# Patient Record
Sex: Male | Born: 1985
Health system: Southern US, Community
[De-identification: ages and names within clinical notes are randomized; demographics above are authoritative.]

---
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
## 2010-04-23 ENCOUNTER — Emergency Department (HOSPITAL_COMMUNITY)
Admission: EM | Admit: 2010-04-23 | Discharge: 2010-04-24 | Payer: Self-pay | Source: Home / Self Care | Admitting: Emergency Medicine

## 2016-03-14 ENCOUNTER — Encounter (HOSPITAL_COMMUNITY): Payer: Self-pay | Admitting: Emergency Medicine

## 2016-03-14 ENCOUNTER — Ambulatory Visit (HOSPITAL_COMMUNITY)
Admission: EM | Admit: 2016-03-14 | Discharge: 2016-03-14 | Disposition: A | Discharge status: Home / Self Care | Payer: BLUE CROSS/BLUE SHIELD | Attending: Internal Medicine | Admitting: Internal Medicine

## 2016-03-14 DIAGNOSIS — M7041 Prepatellar bursitis, right knee: Secondary | ICD-10-CM

## 2016-03-14 MED ORDER — SULFAMETHOXAZOLE-TRIMETHOPRIM 800-160 MG PO TABS: 1.0000 | ORAL_TABLET | Freq: Two times a day (BID) | ORAL | 0 refills | Status: AC

## 2016-03-14 MED ORDER — IBUPROFEN 800 MG PO TABS: 800.0000 mg | ORAL_TABLET | Freq: Three times a day (TID) | ORAL | 0 refills | Status: DC

## 2016-03-14 NOTE — Discharge Instructions (Signed)
Return here for recheck or see your Physician tomorrow for recheck

## 2016-03-14 NOTE — ED Triage Notes (Signed)
Pt reports chronic issues with staff infection.  He normally will work the abscess out himself, but this one is on his knee and it is very hard and painful and will not drain.  He denies any fever.

## 2016-03-15 NOTE — ED Provider Notes (Signed)
[facility]-EMERGENCY DEPT Provider Note   CSN: [id number] Arrival date & time: 03/14/16  1755     History   Chief Complaint Chief Complaint  Patient presents with  . Abscess    right knee    HPI David Craig is a 30 y.o. male.  The history is provided by the patient. No language interpreter was used.  Abscess  Location:  Leg Leg abscess location:  R knee Size:  4 Abscess quality: painful and redness   Red streaking: no   Duration:  3 days Progression:  Worsening Pain details:    Quality:  Hot   Severity:  Moderate   Duration:  3 days   Timing:  Constant   Progression:  Worsening Chronicity:  New Relieved by:  Nothing Worsened by:  Nothing Ineffective treatments:  None tried Associated symptoms: no vomiting    Pt reports he has frequent stph infections.  Pt reports he got a pustule on his knee that he popped, now his knee is swollen History reviewed. No pertinent past medical history.  There are no active problems to display for this patient.   History reviewed. No pertinent surgical history.     Home Medications    Prior to Admission medications   Medication Sig Start Date End Date Taking? Authorizing Provider  ibuprofen (ADVIL,MOTRIN) 800 MG tablet Take 1 tablet (800 mg total) by mouth 3 (three) times daily. 03/14/16   Elson AreasLeslie K Taylar Hartsough, PA-C  sulfamethoxazole-trimethoprim (BACTRIM DS,SEPTRA DS) 800-160 MG tablet Take 1 tablet by mouth 2 (two) times daily. 03/14/16 03/21/16  Elson AreasLeslie K Lorris Carducci, PA-C    Family History History reviewed. No pertinent family history.  Social History Social History  Substance Use Topics  . Smoking status: Former Smoker    Quit date: 03/15/2015  . Smokeless tobacco: Never Used  . Alcohol use Yes     Comment: occasional     Allergies   Review of patient's allergies indicates no known allergies.   Review of Systems Review of Systems  Gastrointestinal: Negative for vomiting.  All other systems reviewed and are  negative.    Physical Exam Updated Vital Signs BP 150/86 (BP Location: Left Arm)   Pulse 83   Temp 98.2 F (36.8 C) (Oral)   SpO2 99%   Physical Exam  Constitutional: He is oriented to person, place, and time. He appears well-developed and well-nourished.  HENT:  Head: Normocephalic and atraumatic.  Eyes: Conjunctivae and EOM are normal.  Neck: Normal range of motion. Neck supple.  Cardiovascular: Normal rate.   No murmur heard. Pulmonary/Chest: Effort normal. No respiratory distress.  Abdominal: Soft. He exhibits no distension. There is no tenderness.  Musculoskeletal: He exhibits no edema.  4cm area of redness over bursa,  Dried scab center,  Knee joint no effusion,  No swelling to joint.  nv and ns intact  Neurological: He is alert and oriented to person, place, and time.  Skin: Skin is warm and dry.  Psychiatric: He has a normal mood and affect.  Nursing note and vitals reviewed.    ED Treatments / Results  Labs (all labs ordered are listed, but only abnormal results are displayed) Labs Reviewed - No data to display  EKG  EKG Interpretation None       Radiology No results found.  Procedures Procedures (including critical care time)  Medications Ordered in ED Medications - No data to display   Initial Impression / Assessment and Plan / ED Course  I have reviewed the triage vital  signs and the nursing notes.  Pertinent labs & imaging results that were available during my care of the patient were reviewed by me and considered in my medical decision making (see chart for details).  Clinical Course    Infection seems to be skin,  Possible bursistis as well as infected area.  Pt works on his knees.  No signs of septic joint.  Pt is to recheck with his MD tomorrow.    Final Clinical Impressions(s) / ED Diagnoses   Final diagnoses:  Bursitis, prepatellar, right    New Prescriptions Discharge Medication List as of 03/14/2016  7:04 PM    START taking  these medications   Details  ibuprofen (ADVIL,MOTRIN) 800 MG tablet Take 1 tablet (800 mg total) by mouth 3 (three) times daily., Starting Thu 03/14/2016, Normal    sulfamethoxazole-trimethoprim (BACTRIM DS,SEPTRA DS) 800-160 MG tablet Take 1 tablet by mouth 2 (two) times daily., Starting Thu 03/14/2016, Until Thu 03/21/2016, Normal      An After Visit Summary was printed and given to the patient.   Lonia Skinner [place], PA-C 03/15/16 1011

## 2018-10-26 DIAGNOSIS — R0602 Shortness of breath: Secondary | ICD-10-CM | POA: Diagnosis not present

## 2018-10-29 DIAGNOSIS — R0602 Shortness of breath: Secondary | ICD-10-CM | POA: Diagnosis not present

## 2019-02-25 ENCOUNTER — Other Ambulatory Visit: Payer: Self-pay

## 2019-02-25 DIAGNOSIS — Z20822 Contact with and (suspected) exposure to covid-19: Secondary | ICD-10-CM

## 2019-02-26 LAB — NOVEL CORONAVIRUS, NAA: SARS-CoV-2, NAA: NOT DETECTED

## 2019-03-22 ENCOUNTER — Other Ambulatory Visit: Payer: Self-pay

## 2019-03-22 DIAGNOSIS — Z20822 Contact with and (suspected) exposure to covid-19: Secondary | ICD-10-CM

## 2019-03-23 LAB — NOVEL CORONAVIRUS, NAA: SARS-CoV-2, NAA: NOT DETECTED

## 2021-02-14 ENCOUNTER — Encounter (HOSPITAL_COMMUNITY)
Admission: EM | Disposition: A | Discharge status: Rehabilitation Facility | Payer: Self-pay | Source: Home / Self Care | Attending: Neurology

## 2021-02-14 ENCOUNTER — Emergency Department (HOSPITAL_COMMUNITY): Payer: BC Managed Care – PPO

## 2021-02-14 ENCOUNTER — Inpatient Hospital Stay (HOSPITAL_COMMUNITY): Payer: BC Managed Care – PPO | Admitting: Anesthesiology

## 2021-02-14 ENCOUNTER — Inpatient Hospital Stay (HOSPITAL_COMMUNITY)
Admission: EM | Admit: 2021-02-14 | Discharge: 2021-02-22 | DRG: 023 | Disposition: A | Discharge status: Rehabilitation Facility | Payer: BC Managed Care – PPO | Attending: Neurology | Admitting: Neurology

## 2021-02-14 ENCOUNTER — Inpatient Hospital Stay (HOSPITAL_COMMUNITY): Payer: BC Managed Care – PPO

## 2021-02-14 DIAGNOSIS — E46 Unspecified protein-calorie malnutrition: Secondary | ICD-10-CM | POA: Diagnosis not present

## 2021-02-14 DIAGNOSIS — W182XXA Fall in (into) shower or empty bathtub, initial encounter: Secondary | ICD-10-CM | POA: Diagnosis present

## 2021-02-14 DIAGNOSIS — I63541 Cerebral infarction due to unspecified occlusion or stenosis of right cerebellar artery: Secondary | ICD-10-CM | POA: Diagnosis not present

## 2021-02-14 DIAGNOSIS — Q211 Atrial septal defect: Secondary | ICD-10-CM

## 2021-02-14 DIAGNOSIS — D6851 Activated protein C resistance: Secondary | ICD-10-CM | POA: Diagnosis present

## 2021-02-14 DIAGNOSIS — Z20822 Contact with and (suspected) exposure to covid-19: Secondary | ICD-10-CM | POA: Diagnosis not present

## 2021-02-14 DIAGNOSIS — R29712 NIHSS score 12: Secondary | ICD-10-CM | POA: Diagnosis present

## 2021-02-14 DIAGNOSIS — F1290 Cannabis use, unspecified, uncomplicated: Secondary | ICD-10-CM | POA: Diagnosis present

## 2021-02-14 DIAGNOSIS — F1210 Cannabis abuse, uncomplicated: Secondary | ICD-10-CM | POA: Diagnosis not present

## 2021-02-14 DIAGNOSIS — R5383 Other fatigue: Secondary | ICD-10-CM | POA: Diagnosis not present

## 2021-02-14 DIAGNOSIS — J9600 Acute respiratory failure, unspecified whether with hypoxia or hypercapnia: Secondary | ICD-10-CM | POA: Diagnosis not present

## 2021-02-14 DIAGNOSIS — R471 Dysarthria and anarthria: Secondary | ICD-10-CM | POA: Diagnosis present

## 2021-02-14 DIAGNOSIS — I63531 Cerebral infarction due to unspecified occlusion or stenosis of right posterior cerebral artery: Secondary | ICD-10-CM | POA: Diagnosis not present

## 2021-02-14 DIAGNOSIS — G8194 Hemiplegia, unspecified affecting left nondominant side: Secondary | ICD-10-CM | POA: Diagnosis present

## 2021-02-14 DIAGNOSIS — Y93E1 Activity, personal bathing and showering: Secondary | ICD-10-CM

## 2021-02-14 DIAGNOSIS — I69391 Dysphagia following cerebral infarction: Secondary | ICD-10-CM | POA: Diagnosis not present

## 2021-02-14 DIAGNOSIS — E8809 Other disorders of plasma-protein metabolism, not elsewhere classified: Secondary | ICD-10-CM | POA: Diagnosis not present

## 2021-02-14 DIAGNOSIS — Z539 Procedure and treatment not carried out, unspecified reason: Secondary | ICD-10-CM | POA: Diagnosis not present

## 2021-02-14 DIAGNOSIS — Y92009 Unspecified place in unspecified non-institutional (private) residence as the place of occurrence of the external cause: Secondary | ICD-10-CM

## 2021-02-14 DIAGNOSIS — I6329 Cerebral infarction due to unspecified occlusion or stenosis of other precerebral arteries: Secondary | ICD-10-CM | POA: Diagnosis not present

## 2021-02-14 DIAGNOSIS — E785 Hyperlipidemia, unspecified: Secondary | ICD-10-CM | POA: Diagnosis present

## 2021-02-14 DIAGNOSIS — G479 Sleep disorder, unspecified: Secondary | ICD-10-CM | POA: Diagnosis not present

## 2021-02-14 DIAGNOSIS — I63511 Cerebral infarction due to unspecified occlusion or stenosis of right middle cerebral artery: Secondary | ICD-10-CM | POA: Diagnosis not present

## 2021-02-14 DIAGNOSIS — Z28310 Unvaccinated for covid-19: Secondary | ICD-10-CM | POA: Diagnosis not present

## 2021-02-14 DIAGNOSIS — Z6834 Body mass index (BMI) 34.0-34.9, adult: Secondary | ICD-10-CM

## 2021-02-14 DIAGNOSIS — I69354 Hemiplegia and hemiparesis following cerebral infarction affecting left non-dominant side: Secondary | ICD-10-CM | POA: Diagnosis not present

## 2021-02-14 DIAGNOSIS — I6521 Occlusion and stenosis of right carotid artery: Secondary | ICD-10-CM | POA: Diagnosis present

## 2021-02-14 DIAGNOSIS — R29810 Facial weakness: Secondary | ICD-10-CM | POA: Diagnosis present

## 2021-02-14 DIAGNOSIS — E876 Hypokalemia: Secondary | ICD-10-CM | POA: Diagnosis not present

## 2021-02-14 DIAGNOSIS — S93402A Sprain of unspecified ligament of left ankle, initial encounter: Secondary | ICD-10-CM | POA: Diagnosis not present

## 2021-02-14 DIAGNOSIS — F1410 Cocaine abuse, uncomplicated: Secondary | ICD-10-CM | POA: Diagnosis not present

## 2021-02-14 DIAGNOSIS — Z7982 Long term (current) use of aspirin: Secondary | ICD-10-CM | POA: Diagnosis not present

## 2021-02-14 DIAGNOSIS — Z87891 Personal history of nicotine dependence: Secondary | ICD-10-CM | POA: Diagnosis not present

## 2021-02-14 DIAGNOSIS — I63232 Cerebral infarction due to unspecified occlusion or stenosis of left carotid arteries: Secondary | ICD-10-CM | POA: Diagnosis not present

## 2021-02-14 DIAGNOSIS — R9431 Abnormal electrocardiogram [ECG] [EKG]: Secondary | ICD-10-CM | POA: Diagnosis not present

## 2021-02-14 DIAGNOSIS — S93402D Sprain of unspecified ligament of left ankle, subsequent encounter: Secondary | ICD-10-CM | POA: Diagnosis not present

## 2021-02-14 DIAGNOSIS — I63231 Cerebral infarction due to unspecified occlusion or stenosis of right carotid arteries: Secondary | ICD-10-CM | POA: Diagnosis not present

## 2021-02-14 DIAGNOSIS — R0902 Hypoxemia: Secondary | ICD-10-CM | POA: Diagnosis not present

## 2021-02-14 DIAGNOSIS — Z7902 Long term (current) use of antithrombotics/antiplatelets: Secondary | ICD-10-CM | POA: Diagnosis not present

## 2021-02-14 DIAGNOSIS — I639 Cerebral infarction, unspecified: Secondary | ICD-10-CM | POA: Diagnosis not present

## 2021-02-14 DIAGNOSIS — I7771 Dissection of carotid artery: Secondary | ICD-10-CM | POA: Diagnosis not present

## 2021-02-14 DIAGNOSIS — E669 Obesity, unspecified: Secondary | ICD-10-CM | POA: Diagnosis present

## 2021-02-14 DIAGNOSIS — I69322 Dysarthria following cerebral infarction: Secondary | ICD-10-CM | POA: Diagnosis not present

## 2021-02-14 DIAGNOSIS — W19XXXA Unspecified fall, initial encounter: Secondary | ICD-10-CM

## 2021-02-14 DIAGNOSIS — I10 Essential (primary) hypertension: Secondary | ICD-10-CM | POA: Diagnosis not present

## 2021-02-14 DIAGNOSIS — R29818 Other symptoms and signs involving the nervous system: Secondary | ICD-10-CM | POA: Diagnosis not present

## 2021-02-14 DIAGNOSIS — F1910 Other psychoactive substance abuse, uncomplicated: Secondary | ICD-10-CM | POA: Diagnosis not present

## 2021-02-14 DIAGNOSIS — J95821 Acute postprocedural respiratory failure: Secondary | ICD-10-CM | POA: Diagnosis not present

## 2021-02-14 DIAGNOSIS — T148XXA Other injury of unspecified body region, initial encounter: Secondary | ICD-10-CM | POA: Diagnosis not present

## 2021-02-14 DIAGNOSIS — D6852 Prothrombin gene mutation: Secondary | ICD-10-CM | POA: Diagnosis present

## 2021-02-14 DIAGNOSIS — F1490 Cocaine use, unspecified, uncomplicated: Secondary | ICD-10-CM | POA: Diagnosis not present

## 2021-02-14 DIAGNOSIS — R531 Weakness: Secondary | ICD-10-CM | POA: Diagnosis present

## 2021-02-14 DIAGNOSIS — R739 Hyperglycemia, unspecified: Secondary | ICD-10-CM | POA: Diagnosis not present

## 2021-02-14 DIAGNOSIS — H53462 Homonymous bilateral field defects, left side: Secondary | ICD-10-CM | POA: Diagnosis not present

## 2021-02-14 DIAGNOSIS — G43909 Migraine, unspecified, not intractable, without status migrainosus: Secondary | ICD-10-CM | POA: Diagnosis present

## 2021-02-14 DIAGNOSIS — R7401 Elevation of levels of liver transaminase levels: Secondary | ICD-10-CM | POA: Diagnosis present

## 2021-02-14 DIAGNOSIS — R1310 Dysphagia, unspecified: Secondary | ICD-10-CM | POA: Diagnosis present

## 2021-02-14 DIAGNOSIS — D6862 Lupus anticoagulant syndrome: Secondary | ICD-10-CM | POA: Diagnosis not present

## 2021-02-14 DIAGNOSIS — I081 Rheumatic disorders of both mitral and tricuspid valves: Secondary | ICD-10-CM | POA: Diagnosis not present

## 2021-02-14 DIAGNOSIS — I6389 Other cerebral infarction: Secondary | ICD-10-CM | POA: Diagnosis not present

## 2021-02-14 DIAGNOSIS — Z978 Presence of other specified devices: Secondary | ICD-10-CM

## 2021-02-14 DIAGNOSIS — F17200 Nicotine dependence, unspecified, uncomplicated: Secondary | ICD-10-CM | POA: Diagnosis not present

## 2021-02-14 HISTORY — PX: RADIOLOGY WITH ANESTHESIA: SHX6223

## 2021-02-14 LAB — COMPREHENSIVE METABOLIC PANEL
ALT: 58 U/L — ABNORMAL HIGH (ref 0–44)
AST: 31 U/L (ref 15–41)
Albumin: 4.4 g/dL (ref 3.5–5.0)
Alkaline Phosphatase: 64 U/L (ref 38–126)
Anion gap: 10 (ref 5–15)
BUN: 13 mg/dL (ref 6–20)
CO2: 28 mmol/L (ref 22–32)
Calcium: 9.7 mg/dL (ref 8.9–10.3)
Chloride: 101 mmol/L (ref 98–111)
Creatinine, Ser: 0.7 mg/dL (ref 0.61–1.24)
GFR, Estimated: >60 mL/min (ref >60)
Glucose, Bld: 131 mg/dL — ABNORMAL HIGH (ref 70–99)
Potassium: 4 mmol/L (ref 3.5–5.1)
Sodium: 139 mmol/L (ref 135–145)
Total Bilirubin: 1.3 mg/dL — ABNORMAL HIGH (ref 0.3–1.2)
Total Protein: 7.6 g/dL (ref 6.5–8.1)

## 2021-02-14 LAB — CBC
HCT: 50.4 % (ref 39.0–52.0)
Hemoglobin: 17.9 g/dL — ABNORMAL HIGH (ref 13.0–17.0)
MCH: 31.1 pg (ref 26.0–34.0)
MCHC: 35.5 g/dL (ref 30.0–36.0)
MCV: 87.5 fL (ref 80.0–100.0)
Platelets: 176 10*3/uL (ref 150–400)
RBC: 5.76 MIL/uL (ref 4.22–5.81)
RDW: 12.6 % (ref 11.5–15.5)
WBC: 13.9 10*3/uL — ABNORMAL HIGH (ref 4.0–10.5)
nRBC: 0 % (ref 0.0–0.2)

## 2021-02-14 LAB — DIFFERENTIAL
Abs Immature Granulocytes: 0.05 10*3/uL (ref 0.00–0.07)
Basophils Absolute: 0 10*3/uL (ref 0.0–0.1)
Basophils Relative: 0 %
Eosinophils Absolute: 0 10*3/uL (ref 0.0–0.5)
Eosinophils Relative: 0 %
Immature Granulocytes: 0 %
Lymphocytes Relative: 8 %
Lymphs Abs: 1.2 10*3/uL (ref 0.7–4.0)
Monocytes Absolute: 0.7 10*3/uL (ref 0.1–1.0)
Monocytes Relative: 5 %
Neutro Abs: 11.9 10*3/uL — ABNORMAL HIGH (ref 1.7–7.7)
Neutrophils Relative %: 87 %

## 2021-02-14 LAB — I-STAT CHEM 8, ED
BUN: 16 mg/dL (ref 6–20)
Calcium, Ion: 1.11 mmol/L — ABNORMAL LOW (ref 1.15–1.40)
Chloride: 103 mmol/L (ref 98–111)
Creatinine, Ser: 0.7 mg/dL (ref 0.61–1.24)
Glucose, Bld: 127 mg/dL — ABNORMAL HIGH (ref 70–99)
HCT: 51 % (ref 39.0–52.0)
Hemoglobin: 17.3 g/dL — ABNORMAL HIGH (ref 13.0–17.0)
Potassium: 4.2 mmol/L (ref 3.5–5.1)
Sodium: 140 mmol/L (ref 135–145)
TCO2: 26 mmol/L (ref 22–32)

## 2021-02-14 LAB — ETHANOL: Alcohol, Ethyl (B): <10 mg/dL (ref <10)

## 2021-02-14 LAB — RESP PANEL BY RT-PCR (FLU A&B, COVID) ARPGX2
Influenza A by PCR: NEGATIVE
Influenza B by PCR: NEGATIVE
SARS Coronavirus 2 by RT PCR: NEGATIVE

## 2021-02-14 LAB — APTT: aPTT: 27 seconds (ref 24–36)

## 2021-02-14 LAB — PROTIME-INR
INR: 1 (ref 0.8–1.2)
Prothrombin Time: 13.6 seconds (ref 11.4–15.2)

## 2021-02-14 IMAGING — CT CT CERVICAL SPINE W/O CM
3 of 4 series · 13 of 33 positions shown, 16 images · IV contrast (APPLIED)
Comparison: None.

CLINICAL DATA: Fall

EXAM:
CT CERVICAL SPINE WITHOUT CONTRAST
TECHNIQUE: Multidetector CT imaging of the cervical spine was performed without
intravenous contrast. Multiplanar CT image reconstructions were also
generated.

[Series 15: cspine bone · axial · 0.36mm/px · z∈[-332,-182]mm · 5 of 113 slices shown, 7 images]
[im 19/113  soft-tissue]
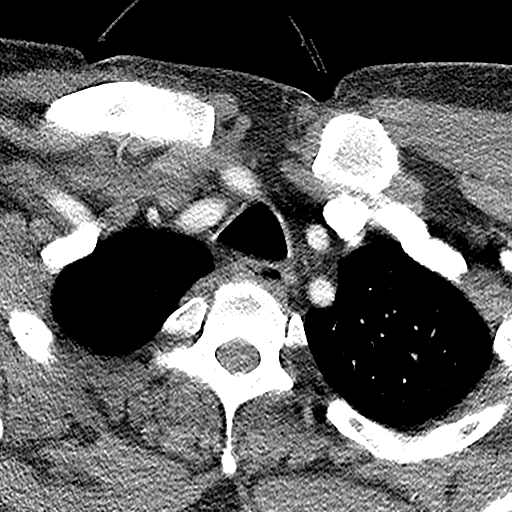
[im 19/113  bone]
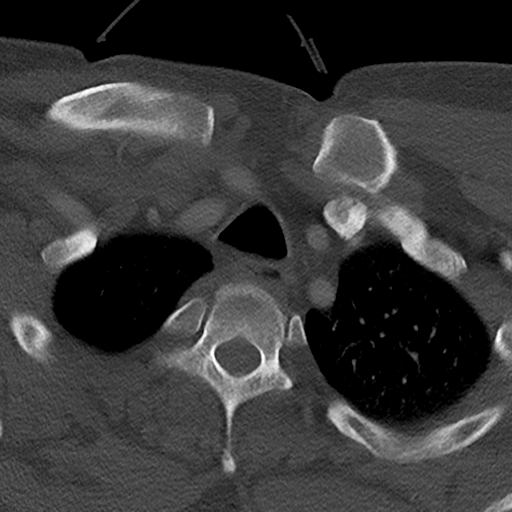
[im 38/113  bone]
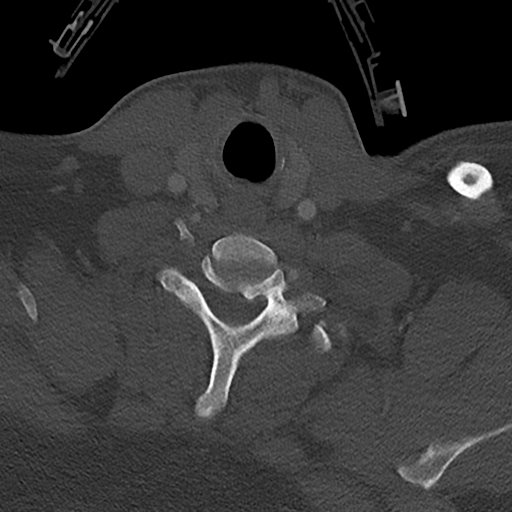
[im 57/113  bone]
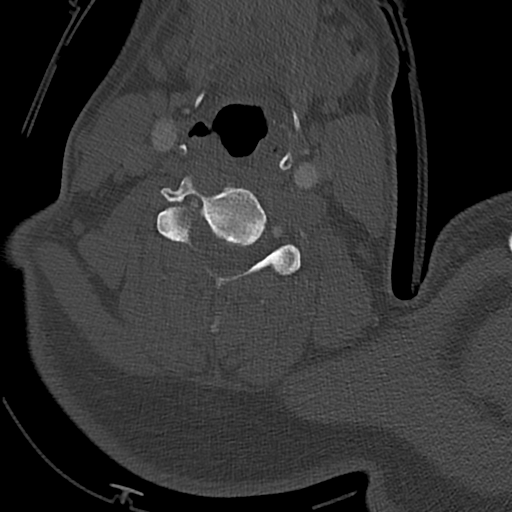
[im 75/113  bone]
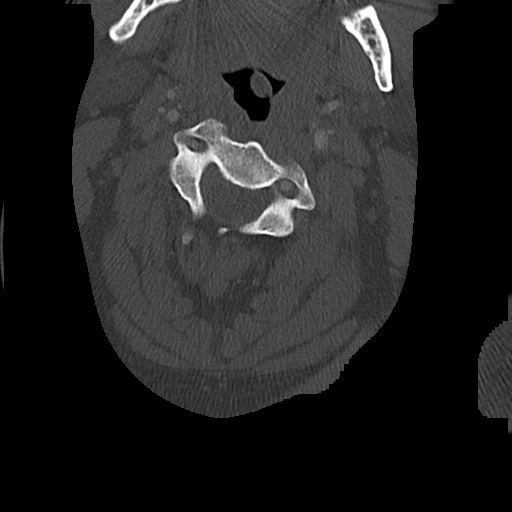
[im 94/113  soft-tissue]
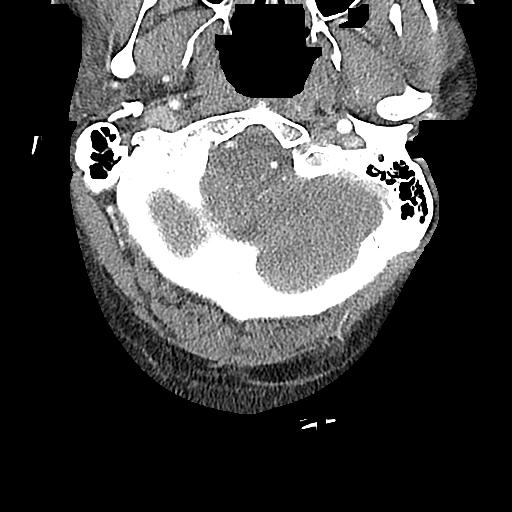
[im 94/113  bone]
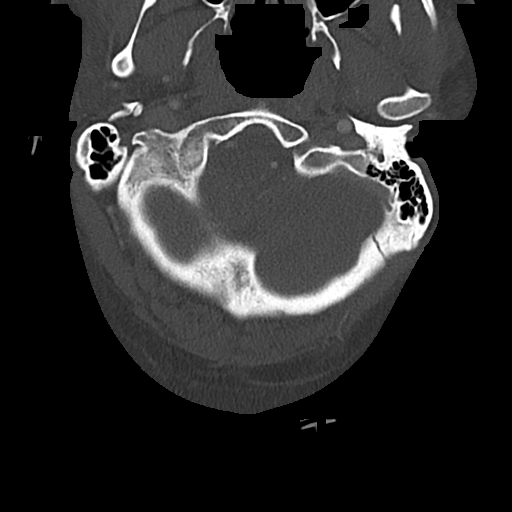

[Series 17: cspine bone cor · coronal · 0.36mm/px · 3 of 74 slices shown]
[im 15/74  bone]
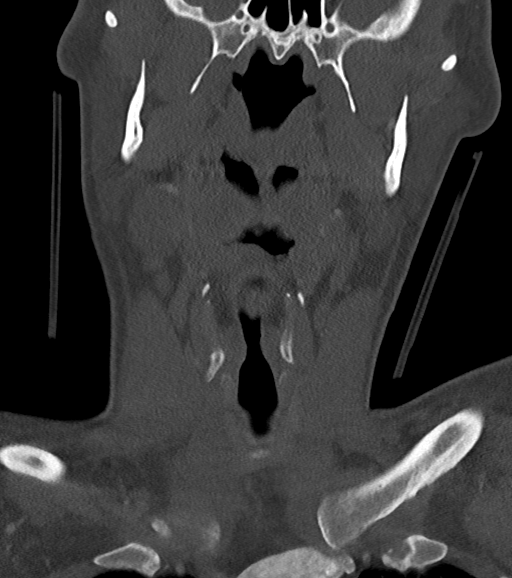
[im 30/74  bone]
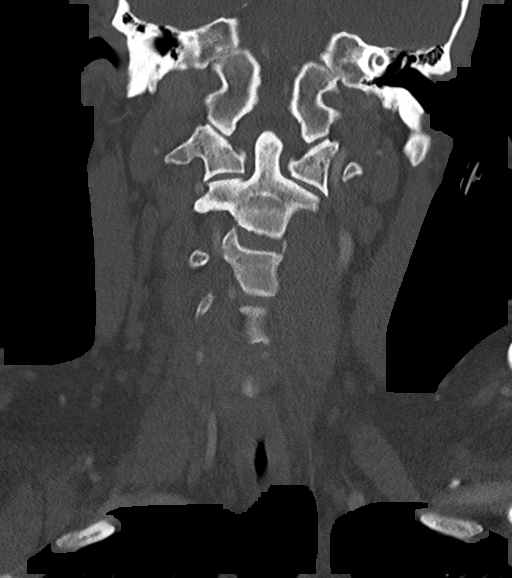
[im 44/74  bone]
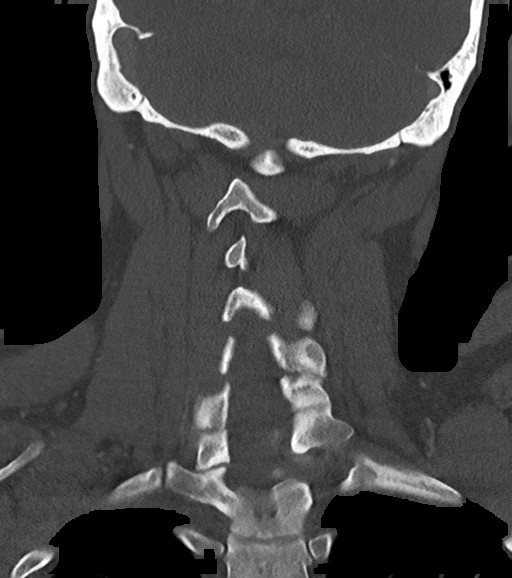

[Series 18: cspine bone sag · sagittal · 0.29mm/px · 5 of 94 slices shown, 6 images]
[im 32/94  bone]
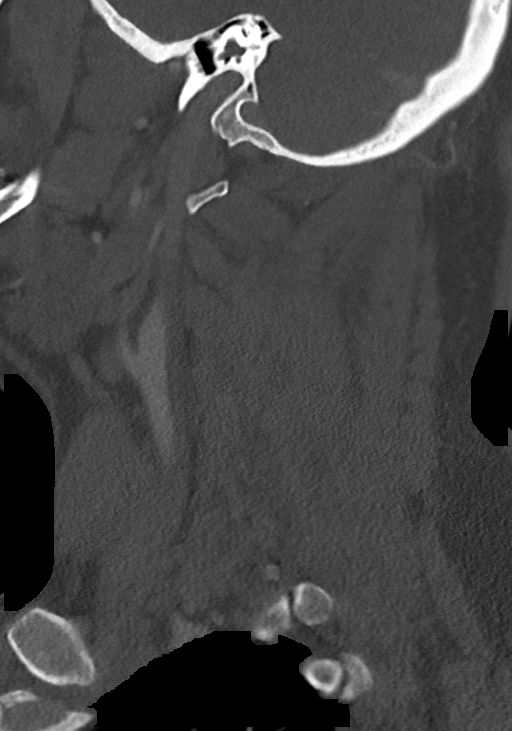
[im 39/94  bone]
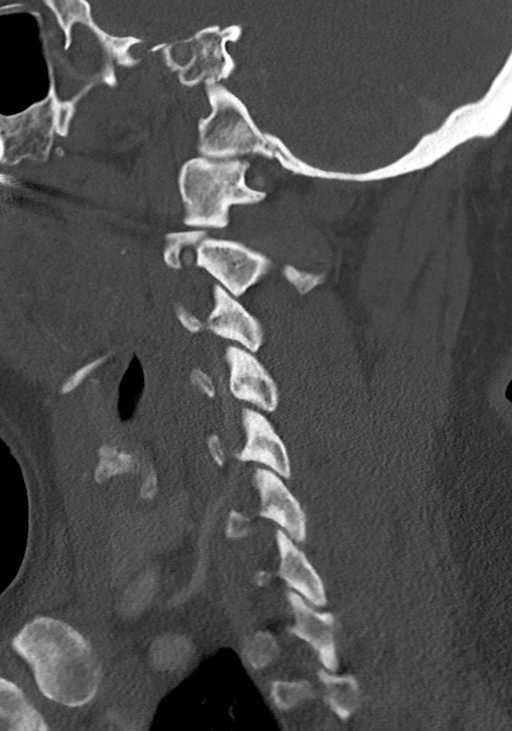
[im 47/94  soft-tissue]
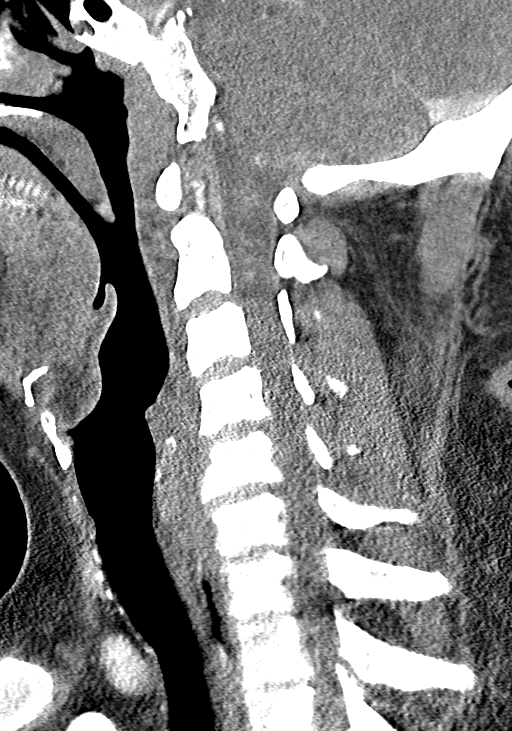
[im 47/94  bone]
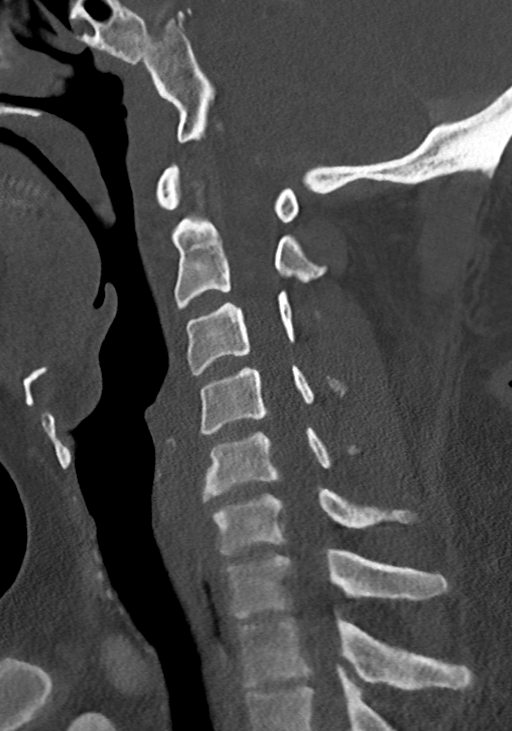
[im 55/94  bone]
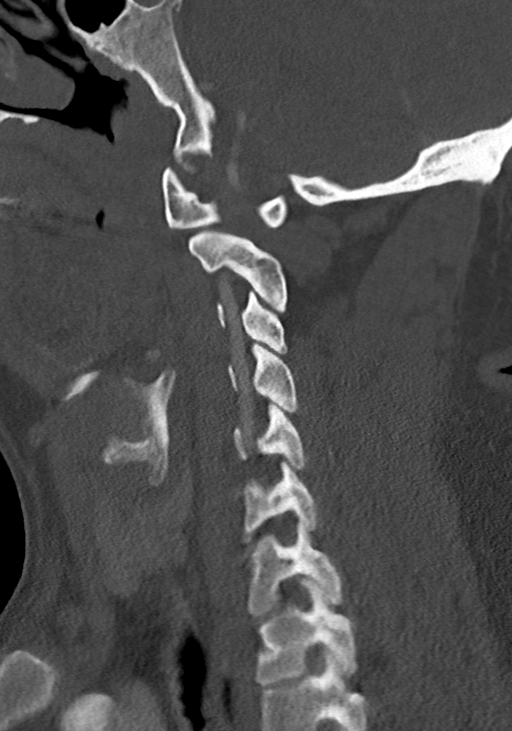
[im 63/94  bone]
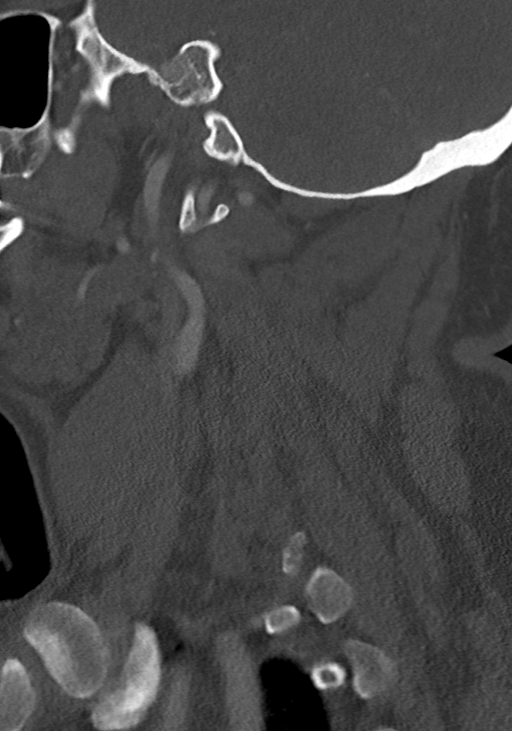

[13 of 33 positions shown; findings below may reference images not displayed]

FINDINGS: Alignment: No static subluxation. Facets are aligned. Occipital
condyles and the lateral masses of C1 and C2 are normally
approximated.

Skull base and vertebrae: No acute fracture.

Soft tissues and spinal canal: No prevertebral fluid or swelling. No
visible canal hematoma.

Disc levels: No advanced spinal canal or neural foraminal stenosis.

Upper chest: No pneumothorax, pulmonary nodule or pleural effusion.

Other: Normal visualized paraspinal cervical soft tissues.
IMPRESSION: No acute fracture or static subluxation of the cervical spine.

## 2021-02-14 IMAGING — CT CT HEAD CODE STROKE
4 series · 16 of 47 positions shown, 18 images · non-contrast
Comparison: None.

CLINICAL DATA: Code stroke.  Left arm weakness and facial droop

EXAM:
CT HEAD WITHOUT CONTRAST
TECHNIQUE: Contiguous axial images were obtained from the base of the skull
through the vertex without intravenous contrast.

[Series 3: head wo · axial · 0.46mm/px · z∈[-166,-46]mm · 7 of 34 slices shown, 9 images]
[im 5/34  brain]
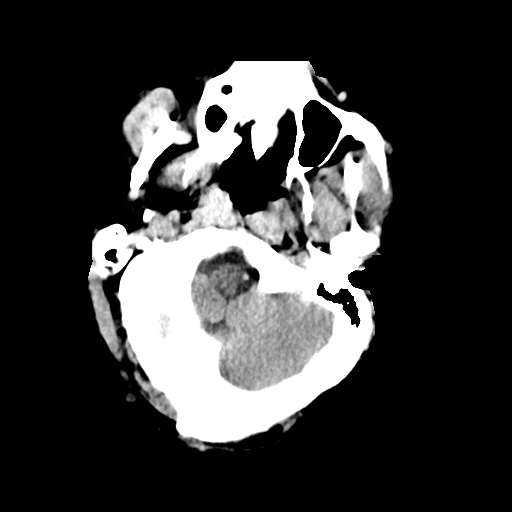
[im 5/34  bone]
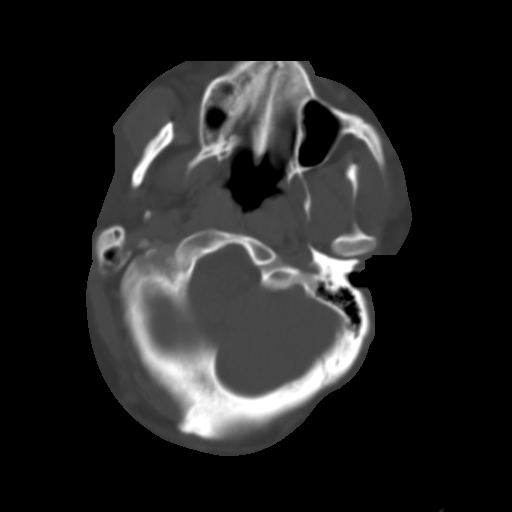
[im 9/34  brain]
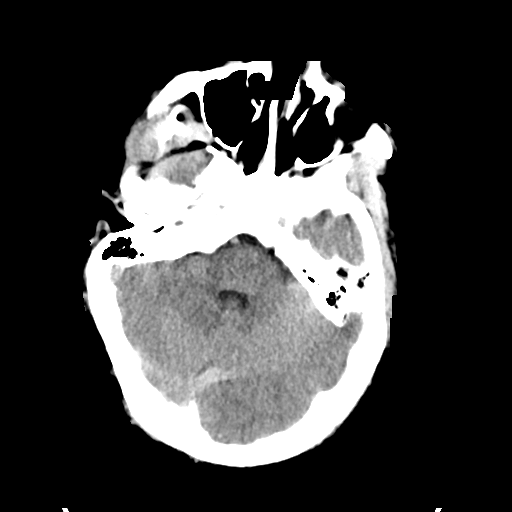
[im 13/34  brain]
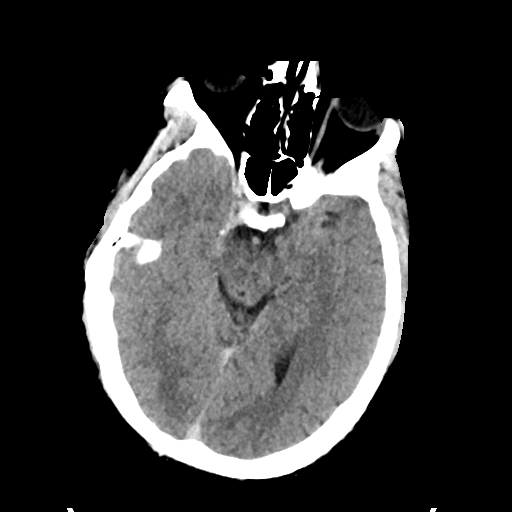
[im 17/34  brain]
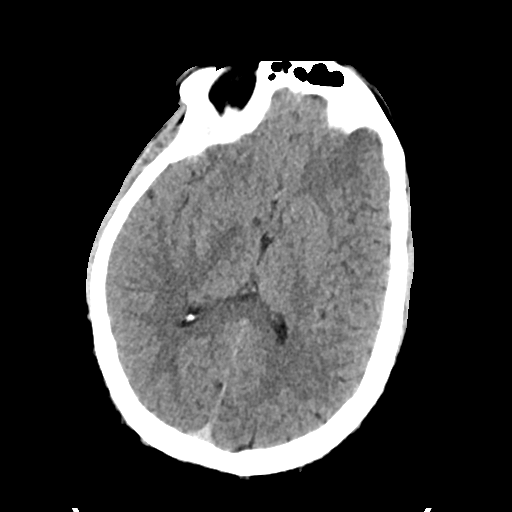
[im 21/34  brain]
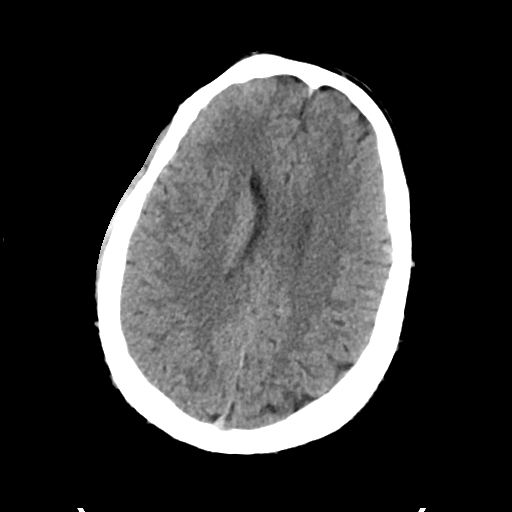
[im 21/34  bone]
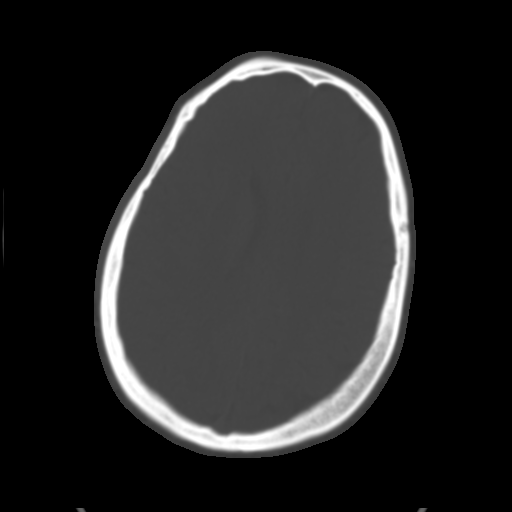
[im 25/34  brain]
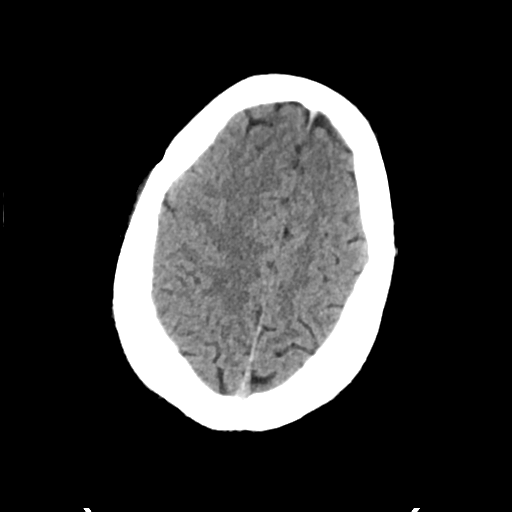
[im 29/34  brain]
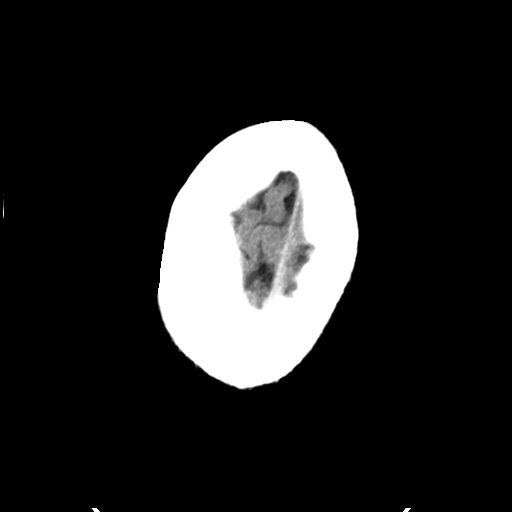

[Series 4: head bone · axial · 0.46mm/px · z∈[-170,-138]mm · 3 of 84 slices shown]
[im 9/84  bone]
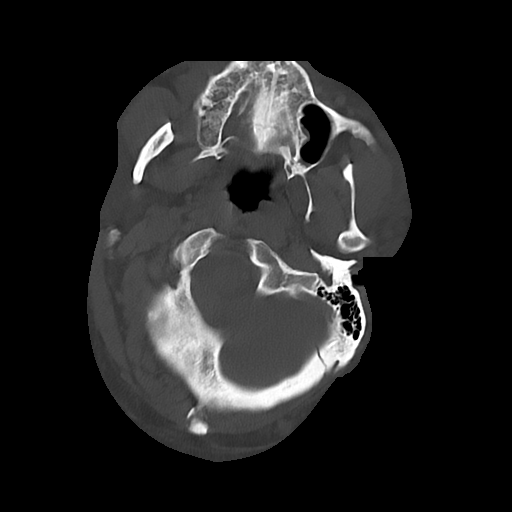
[im 17/84  bone]
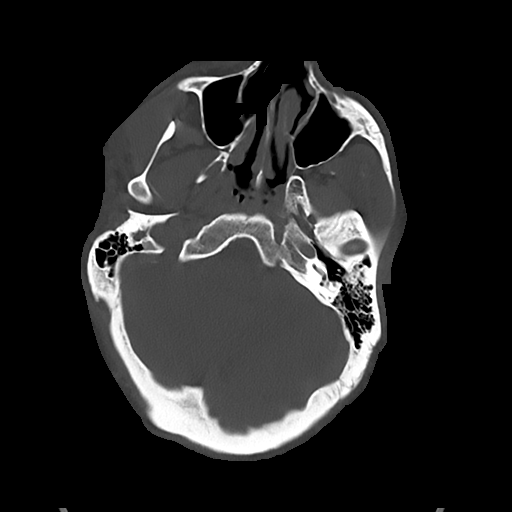
[im 25/84  bone]
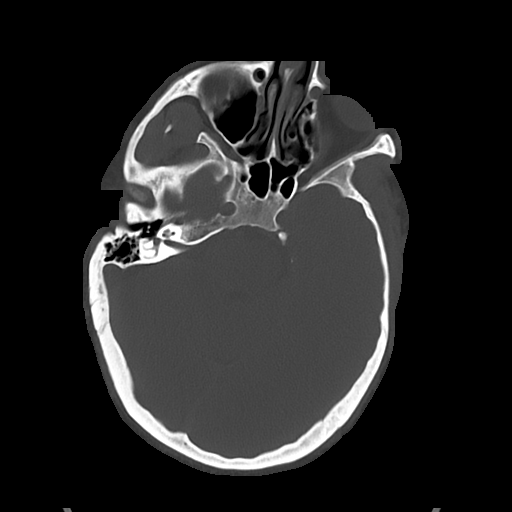

[Series 5: cor soft · coronal · 0.36mm/px · 3 of 80 slices shown]
[im 27/80  brain]
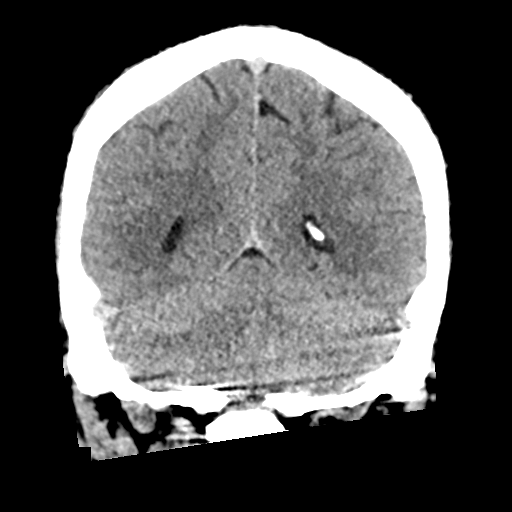
[im 36/80  brain]
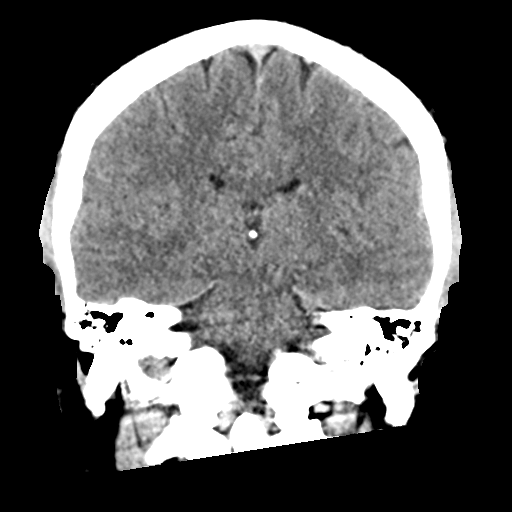
[im 44/80  brain]
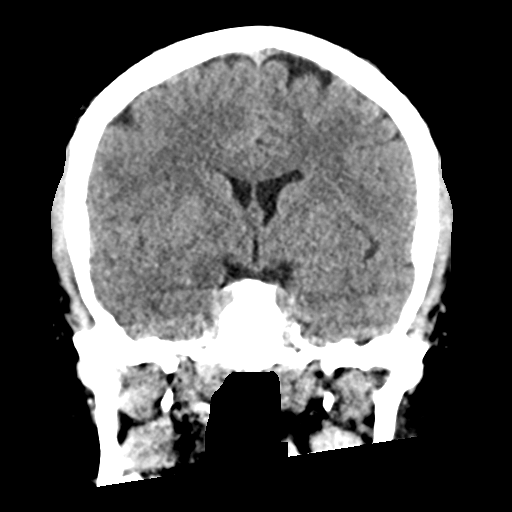

[Series 6: sag soft · sagittal · 0.35mm/px · 3 of 62 slices shown]
[im 24/62  brain]
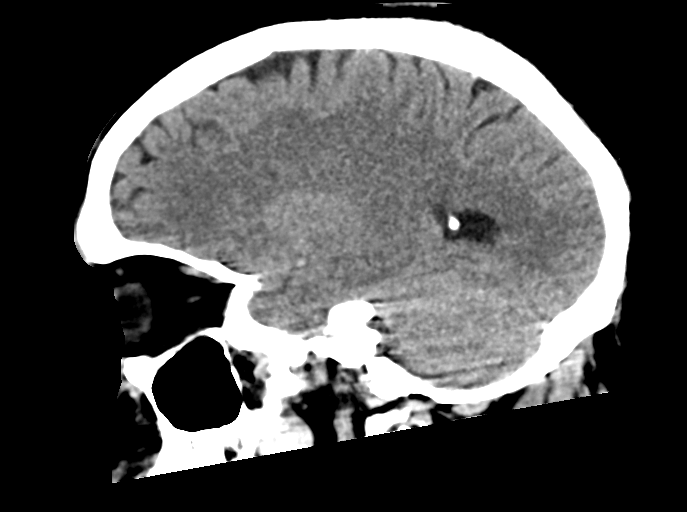
[im 31/62  brain]
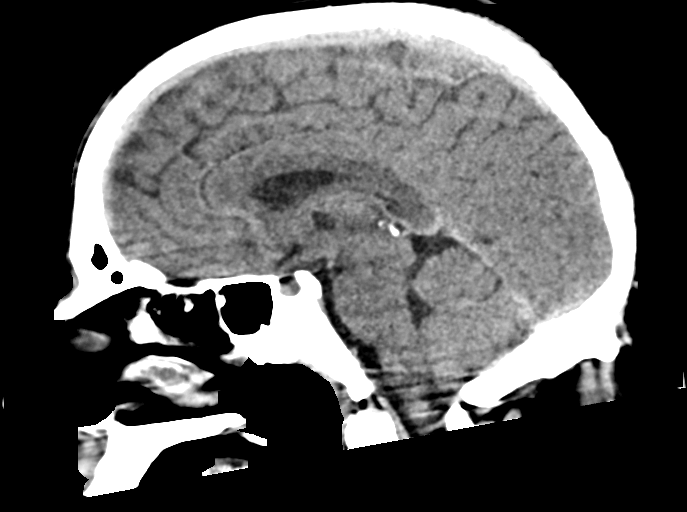
[im 38/62  brain]
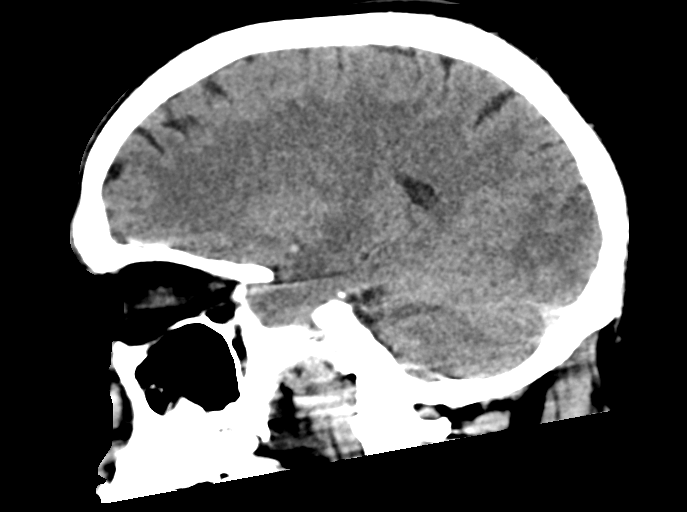

[16 of 47 positions shown; findings below may reference images not displayed]

FINDINGS: Brain: There is no mass, hemorrhage or extra-axial collection. The
size and configuration of the ventricles and extra-axial CSF spaces
are normal. The brain parenchyma is normal, without evidence of
acute or chronic infarction.

Vascular: No abnormal hyperdensity of the major intracranial
arteries or dural venous sinuses. No intracranial atherosclerosis.

Skull: The visualized skull base, calvarium and extracranial soft
tissues are normal.

Sinuses/Orbits: No fluid levels or advanced mucosal thickening of
the visualized paranasal sinuses. No mastoid or middle ear effusion.
The orbits are normal.

ASPECTS (Alberta Stroke Program Early CT Score)

- Ganglionic level infarction (caudate, lentiform nuclei, internal
capsule, insula, M1-M3 cortex): 7

- Supraganglionic infarction (M4-M6 cortex): 3

Total score (0-10 with 10 being normal): 10
IMPRESSION: 1. Normal head CT.
2. ASPECTS is 10.

These results were communicated to Dr. RONLOR at [DATE] on
[DATE] by text page via the [employer] messaging system.

## 2021-02-14 SURGERY — IR WITH ANESTHESIA
Anesthesia: General

## 2021-02-14 MED ORDER — SENNOSIDES-DOCUSATE SODIUM 8.6-50 MG PO TABS: 1.0000 | ORAL_TABLET | Freq: Every evening | ORAL | Status: DC | PRN

## 2021-02-14 MED ORDER — PHENYLEPHRINE HCL (PRESSORS) 10 MG/ML IV SOLN
INTRAVENOUS | Status: DC | PRN
  Administered 2021-02-14 (×2): 80 ug via INTRAVENOUS

## 2021-02-14 MED ORDER — IOHEXOL 350 MG/ML SOLN
100.0000 mL | Freq: Once | INTRAVENOUS | Status: AC | PRN
  Administered 2021-02-14: 100 mL via INTRAVENOUS

## 2021-02-14 MED ORDER — ACETAMINOPHEN 160 MG/5ML PO SOLN: 650.0000 mg | ORAL | Status: DC | PRN

## 2021-02-14 MED ORDER — IOHEXOL 350 MG/ML SOLN
100.0000 mL | Freq: Once | INTRAVENOUS | Status: AC | PRN
  Administered 2021-02-14: 85 mL via INTRA_ARTERIAL

## 2021-02-14 MED ORDER — CLEVIDIPINE BUTYRATE 0.5 MG/ML IV EMUL
0.0000 mg/h | INTRAVENOUS | Status: DC
  Filled 2021-02-14: qty 50

## 2021-02-14 MED ORDER — PROPOFOL 10 MG/ML IV BOLUS
INTRAVENOUS | Status: DC | PRN
  Administered 2021-02-14: 200 mg via INTRAVENOUS
  Administered 2021-02-15 (×2): 40 mg via INTRAVENOUS

## 2021-02-14 MED ORDER — PHENYLEPHRINE HCL-NACL 20-0.9 MG/250ML-% IV SOLN
INTRAVENOUS | Status: DC | PRN
  Administered 2021-02-14: 40 ug/min via INTRAVENOUS

## 2021-02-14 MED ORDER — SODIUM CHLORIDE 0.9 % IV SOLN: INTRAVENOUS | Status: DC

## 2021-02-14 MED ORDER — LACTATED RINGERS IV SOLN: INTRAVENOUS | Status: DC | PRN

## 2021-02-14 MED ORDER — FENTANYL CITRATE (PF) 100 MCG/2ML IJ SOLN
INTRAMUSCULAR | Status: AC
  Filled 2021-02-14: qty 2

## 2021-02-14 MED ORDER — STROKE: EARLY STAGES OF RECOVERY BOOK
Freq: Once | Status: AC
  Administered 2021-02-15: 1
  Filled 2021-02-14: qty 1

## 2021-02-14 MED ORDER — SUCCINYLCHOLINE CHLORIDE 200 MG/10ML IV SOSY
PREFILLED_SYRINGE | INTRAVENOUS | Status: DC | PRN
  Administered 2021-02-14: 100 mg via INTRAVENOUS

## 2021-02-14 MED ORDER — ROCURONIUM BROMIDE 100 MG/10ML IV SOLN
INTRAVENOUS | Status: DC | PRN
  Administered 2021-02-14: 80 mg via INTRAVENOUS
  Administered 2021-02-15: 100 mg via INTRAVENOUS
  Administered 2021-02-15: 20 mg via INTRAVENOUS

## 2021-02-14 MED ORDER — ACETAMINOPHEN 325 MG PO TABS: 650.0000 mg | ORAL_TABLET | ORAL | Status: DC | PRN

## 2021-02-14 MED ORDER — FENTANYL CITRATE (PF) 100 MCG/2ML IJ SOLN
INTRAMUSCULAR | Status: DC | PRN
  Administered 2021-02-14: 100 ug via INTRAVENOUS

## 2021-02-14 MED ORDER — LIDOCAINE 2% (20 MG/ML) 5 ML SYRINGE
INTRAMUSCULAR | Status: DC | PRN
  Administered 2021-02-14: 60 mg via INTRAVENOUS

## 2021-02-14 MED ORDER — ACETAMINOPHEN 650 MG RE SUPP: 650.0000 mg | RECTAL | Status: DC | PRN

## 2021-02-14 MED ORDER — PANTOPRAZOLE SODIUM 40 MG IV SOLR
40.0000 mg | Freq: Every day | INTRAVENOUS | Status: DC
  Administered 2021-02-15 – 2021-02-16 (×2): 40 mg via INTRAVENOUS
  Filled 2021-02-14 (×2): qty 40

## 2021-02-14 MED ORDER — EPHEDRINE SULFATE 50 MG/ML IJ SOLN
INTRAMUSCULAR | Status: DC | PRN
  Administered 2021-02-14: 5 mg via INTRAVENOUS

## 2021-02-14 NOTE — Anesthesia Procedure Notes (Signed)
Procedure Name: Intubation Date/Time: 02/14/2021 11:15 PM Performed by: Claudina Lick, CRNA Pre-anesthesia Checklist: Patient identified, Emergency Drugs available, Suction available and Patient being monitored Patient Re-evaluated:Patient Re-evaluated prior to induction Oxygen Delivery Method: Circle system utilized Preoxygenation: Pre-oxygenation with 100% oxygen Induction Type: IV induction, Rapid sequence and Cricoid Pressure applied Laryngoscope Size: Glidescope and 4 Grade View: Grade I Tube type: Oral Tube size: 8.0 mm Number of attempts: 1 Airway Equipment and Method: Stylet (Head/neck maintained neutral position during induction. C collar loosened for DL) Placement Confirmation: ETT inserted through vocal cords under direct vision, positive ETCO2 and breath sounds checked- equal and bilateral Secured at: 23 cm Tube secured with: Tape Dental Injury: Teeth and Oropharynx as per pre-operative assessment

## 2021-02-14 NOTE — ED Triage Notes (Addendum)
Pt BIBA from home for a fall in the shower at 11am. L side def in legs and arm. During assessment pt with Lside facial droop. MD contacted. NIHSS done. Pt vomited on arrival. C-collar in place.

## 2021-02-14 NOTE — Anesthesia Procedure Notes (Signed)
Arterial Line Insertion Start/End9/14/2022 11:00 PM, 02/14/2021 11:20 PM Performed by: Flowers, Rokoshi T, CRNA  Patient location: OR. Preanesthetic checklist: patient identified, IV checked, site marked, risks and benefits discussed, surgical consent, monitors and equipment checked, pre-op evaluation, timeout performed and anesthesia consent Emergency situation Lidocaine 1% used for infiltration Left, radial was placed Catheter size: 20 G Hand hygiene performed  and maximum sterile barriers used   Attempts: 1 Procedure performed without using ultrasound guided technique. Ultrasound Notes:anatomy identified, needle tip was noted to be adjacent to the nerve/plexus identified and no ultrasound evidence of intravascular and/or intraneural injection Following insertion, dressing applied and Biopatch. Post procedure assessment: normal and unchanged  Patient tolerated the procedure well with no immediate complications.

## 2021-02-14 NOTE — ED Provider Notes (Signed)
[facility] EMERGENCY DEPARTMENT Provider Note   CSN: [id number] Arrival date & time: 02/14/21  2137     History Chief Complaint  Patient presents with   David Craig    David Craig is a 35 y.o. male.  David Craig is a 35 y.o. male who is otherwise healthy, presents to the emergency department via EMS for evaluation after a fall with left-sided weakness.  Last known well time was 11 AM when patient reports that he had a mechanical fall in the shower and hit his head.  Reports a brief loss of consciousness, he had difficulty getting up but got himself out of the shower and lay down, thinking that his left-sided weakness would get better with rest.  Later family came to check on him and noted that he had left-sided weakness of the arm and leg, EMS was called.  Patient denies headache or neck pain.  No lacerations or bleeding after fall.  He denies any pain elsewhere.  EMS noted that he was also having a left facial droop on their evaluation transit and seems to have some slurred speech.  Patient denies any prior medical issues or daily medications.  Denies drug or alcohol use.  He is not on any anticoagulation.  No prior history of stroke or head injury.  No other aggravating or alleviating factors.  The history is provided by the patient and the EMS personnel.      No past medical history on file.  There are no problems to display for this patient.   No past surgical history on file.     No family history on file.  Social History   Tobacco Use   Smoking status: Former    Types: Cigarettes    Quit date: 03/15/2015    Years since quitting: 5.9   Smokeless tobacco: Never  Substance Use Topics   Alcohol use: Yes    Comment: occasional   Drug use: No    Home Medications Prior to Admission medications   Medication Sig Start Date End Date Taking? Authorizing Provider  ibuprofen (ADVIL,MOTRIN) 800 MG tablet Take 1 tablet (800 mg total) by mouth 3 (three) times  daily. 03/14/16   Fransico Meadow, PA-C    Allergies    Patient has no known allergies.  Review of Systems   Review of Systems  Constitutional:  Negative for chills and fever.  HENT: Negative.    Eyes:  Positive for visual disturbance.  Respiratory:  Negative for shortness of breath.   Cardiovascular:  Negative for chest pain.  Gastrointestinal:  Negative for abdominal pain, nausea and vomiting.  Musculoskeletal:  Negative for back pain and neck pain.  Skin:  Negative for color change and rash.  Neurological:  Positive for facial asymmetry, speech difficulty and weakness. Negative for numbness and headaches.  All other systems reviewed and are negative.  Physical Exam Updated Vital Signs BP 130/77 (BP Location: Right Arm)   Pulse 77   Temp 97.9 F (36.6 C) (Oral)   Resp 16   Ht 5' 8"  (1.727 m)   Wt 104.3 kg   SpO2 97%   BMI 34.97 kg/m   Physical Exam Vitals and nursing note reviewed.  Constitutional:      General: He is not in acute distress.    Appearance: Normal appearance. He is well-developed. He is ill-appearing. He is not diaphoretic.     Comments: Alert, ill-appearing but in no acute distress  HENT:     Head: Normocephalic and  atraumatic.     Comments: No hematoma, step off or deformity, neg battle sign    Ears:     Comments: No hemotympanum or CSF otorrhea bilaterally    Mouth/Throat:     Mouth: Mucous membranes are moist.     Pharynx: Oropharynx is clear.  Eyes:     General:        Right eye: No discharge.        Left eye: No discharge.     Comments: Left sided visual field cuts in both eyes, some mild left gaze neglect, PERRLA, EOMi  Neck:     Comments: C-collar in place, no midline C-spine tenderness. Cardiovascular:     Rate and Rhythm: Normal rate and regular rhythm.     Pulses: Normal pulses.     Heart sounds: Normal heart sounds.  Pulmonary:     Effort: Pulmonary effort is normal. No respiratory distress.     Breath sounds: Normal breath  sounds. No wheezing or rales.     Comments: Respirations equal and unlabored, patient able to speak in full sentences, lungs clear to auscultation bilaterally  Chest:     Chest wall: No tenderness.  Abdominal:     General: Bowel sounds are normal. There is no distension.     Palpations: Abdomen is soft. There is no mass.     Tenderness: There is no abdominal tenderness. There is no guarding.     Comments: Abdomen soft, nondistended, nontender to palpation in all quadrants without guarding or peritoneal signs  Musculoskeletal:        General: No deformity.     Cervical back: Neck supple.     Comments: No tenderness or deformity over the extremities, all joints supple and easily movable  Skin:    General: Skin is warm and dry.     Capillary Refill: Capillary refill takes less than 2 seconds.  Neurological:     Mental Status: He is alert and oriented to person, place, and time.     Motor: Weakness present.     Comments: Patient has some slurred speech but is able to follow commands. Left-sided facial droop noted, sensation intact bilaterally, patient also with bilateral left visual field cuts Flaccid weakness of the left arm and leg, 5/5 strength in the right arm and leg. Sensation intact in bilateral upper and lower extremities. Normal coordination with movement of the right extremities.  Psychiatric:        Mood and Affect: Mood normal.        Behavior: Behavior normal.    ED Results / Procedures / Treatments   Labs (all labs ordered are listed, but only abnormal results are displayed) Labs Reviewed  CBC - Abnormal; Notable for the following components:      Result Value   WBC 13.9 (*)    Hemoglobin 17.9 (*)    All other components within normal limits  DIFFERENTIAL - Abnormal; Notable for the following components:   Neutro Abs 11.9 (*)    All other components within normal limits  I-STAT CHEM 8, ED - Abnormal; Notable for the following components:   Glucose, Bld 127 (*)     Calcium, Ion 1.11 (*)    Hemoglobin 17.3 (*)    All other components within normal limits  RESP PANEL BY RT-PCR (FLU A&B, COVID) ARPGX2  ETHANOL  PROTIME-INR  APTT  COMPREHENSIVE METABOLIC PANEL  RAPID URINE DRUG SCREEN, HOSP PERFORMED  URINALYSIS, ROUTINE W REFLEX MICROSCOPIC    EKG None  Radiology CT C-SPINE NO CHARGE  Result Date: 02/14/2021 CLINICAL DATA:  Fall EXAM: CT CERVICAL SPINE WITHOUT CONTRAST TECHNIQUE: Multidetector CT imaging of the cervical spine was performed without intravenous contrast. Multiplanar CT image reconstructions were also generated. COMPARISON:  None. FINDINGS: Alignment: No static subluxation. Facets are aligned. Occipital condyles and the lateral masses of C1 and C2 are normally approximated. Skull base and vertebrae: No acute fracture. Soft tissues and spinal canal: No prevertebral fluid or swelling. No visible canal hematoma. Disc levels: No advanced spinal canal or neural foraminal stenosis. Upper chest: No pneumothorax, pulmonary nodule or pleural effusion. Other: Normal visualized paraspinal cervical soft tissues. IMPRESSION: No acute fracture or static subluxation of the cervical spine. Electronically Signed   By: Ulyses Jarred M.D.   On: 02/14/2021 22:50   CT HEAD CODE STROKE WO CONTRAST  Result Date: 02/14/2021 CLINICAL DATA:  Code stroke.  Left arm weakness and facial droop EXAM: CT HEAD WITHOUT CONTRAST TECHNIQUE: Contiguous axial images were obtained from the base of the skull through the vertex without intravenous contrast. COMPARISON:  None. FINDINGS: Brain: There is no mass, hemorrhage or extra-axial collection. The size and configuration of the ventricles and extra-axial CSF spaces are normal. The brain parenchyma is normal, without evidence of acute or chronic infarction. Vascular: No abnormal hyperdensity of the major intracranial arteries or dural venous sinuses. No intracranial atherosclerosis. Skull: The visualized skull base, calvarium and  extracranial soft tissues are normal. Sinuses/Orbits: No fluid levels or advanced mucosal thickening of the visualized paranasal sinuses. No mastoid or middle ear effusion. The orbits are normal. ASPECTS [facility],The Stroke Program Early CT Score) - Ganglionic level infarction (caudate, lentiform nuclei, internal capsule, insula, M1-M3 cortex): 7 - Supraganglionic infarction (M4-M6 cortex): 3 Total score (0-10 with 10 being normal): 10 IMPRESSION: 1. Normal head CT. 2. ASPECTS is 10. These results were communicated to Dr. Lesleigh Noe at 10:04 pm on 02/14/2021 by text page via the [facility] messaging system. Electronically Signed   By: Ulyses Jarred M.D.   On: 02/14/2021 22:04   CT ANGIO HEAD NECK W WO CM W PERF (CODE STROKE)  Addendum Date: 02/14/2021   ADDENDUM REPORT: 02/14/2021 23:22 ADDENDUM: On further review, there is an area of occlusion of the proximal right posterior communicating artery and the proximal P2 segment of the right posterior cerebral artery. This is likely an extension of the process affecting the distal right internal carotid artery. These results were called by telephone at the time of interpretation on 02/14/2021 at 11:21 pm to provider [facility] , who verbally acknowledged these results. Electronically Signed   By: Ulyses Jarred M.D.   On: 02/14/2021 23:22   Result Date: 02/14/2021 CLINICAL DATA:  Left arm weakness and facial droop EXAM: CT ANGIOGRAPHY HEAD AND NECK CT PERFUSION BRAIN TECHNIQUE: Multidetector CT imaging of the head and neck was performed using the standard protocol during bolus administration of intravenous contrast. Multiplanar CT image reconstructions and MIPs were obtained to evaluate the vascular anatomy. Carotid stenosis measurements (when applicable) are obtained utilizing NASCET criteria, using the distal internal carotid diameter as the denominator. Multiphase CT imaging of the brain was performed following IV bolus contrast injection. Subsequent parametric  perfusion maps were calculated using RAPID software. CONTRAST:  149m OMNIPAQUE IOHEXOL 350 MG/ML SOLN COMPARISON:  None. FINDINGS: CTA NECK FINDINGS SKELETON: There is no bony spinal canal stenosis. No lytic or blastic lesion. OTHER NECK: Normal pharynx, larynx and major salivary glands. No cervical lymphadenopathy. Unremarkable thyroid gland. UPPER CHEST:  No pneumothorax or pleural effusion. No nodules or masses. AORTIC ARCH: There is no calcific atherosclerosis of the aortic arch. There is no aneurysm, dissection or hemodynamically significant stenosis of the visualized portion of the aorta. Conventional 3 vessel aortic branching pattern. The visualized proximal subclavian arteries are widely patent. RIGHT CAROTID SYSTEM: There is tapering and hypoenhancement of the distal right internal carotid artery. LEFT CAROTID SYSTEM: Normal without aneurysm, dissection or stenosis. VERTEBRAL ARTERIES: Left dominant configuration. Both origins are clearly patent. There is no dissection, occlusion or flow-limiting stenosis to the skull base (V1-V3 segments). CTA HEAD FINDINGS POSTERIOR CIRCULATION: --Vertebral arteries: Normal V4 segments. --Inferior cerebellar arteries: Normal. --Basilar artery: Normal. --Superior cerebellar arteries: Normal. --Posterior cerebral arteries (PCA): Normal. ANTERIOR CIRCULATION: --Intracranial internal carotid arteries: The right internal carotid artery is occluded at the distal cavernous segment. It remains occluded to the carotid terminus. The right middle and anterior cerebral arteries fill via flow across the anterior communicating artery. --Anterior cerebral arteries (ACA): Normal. Both A1 segments are present. Patent anterior communicating artery (a-comm). --Middle cerebral arteries (MCA): Normal. VENOUS SINUSES: As permitted by contrast timing, patent. ANATOMIC VARIANTS: None Review of the MIP images confirms the above findings. CT Brain Perfusion Findings: ASPECTS: 10 CBF (<30%) Volume:  63m Perfusion (Tmax>6.0s) volume: 124mMismatch Volume: 12065mnfarction Location:No core infarct. Large area of ischemic penumbra within the right hemisphere in a watershed/hypoperfusion type distribution. IMPRESSION: 1. 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 The right middle and anterior cerebral arteries fill via flow across the anterior communicating artery. 2. Large area of ischemic penumbra within the right hemisphere in a watershed/hypoperfusion type distribution. No core infarct. Critical Value/emergent results were called by telephone at the time of interpretation on 02/14/2021 at 10:35 pm to [facility] these results. Electronically Signed: By: KevUlyses JarredD. On: 02/14/2021 22:36    Procedures .Critical Care Performed by: ForJacqlyn LarsenA-C Authorized by: ForJacqlyn LarsenA-C   Critical care provider statement:    Critical care time (minutes):  45   Critical care was necessary to treat or prevent imminent or life-threatening deterioration of the following conditions:  CNS failure or compromise (CODE Stroke)   Critical care was time spent personally by me on the following activities:  Discussions with consultants, evaluation of patient's response to treatment, examination of patient, ordering and performing treatments and interventions, ordering and review of laboratory studies, ordering and review of radiographic studies, pulse oximetry, re-evaluation of patient's condition, obtaining history from patient or surrogate and review of old charts   Medications Ordered in ED Medications  iohexol (OMNIPAQUE) 350 MG/ML injection 100 mL (100 mLs Intravenous Contrast Given 02/14/21 2219)    ED Course  I have reviewed the triage vital signs and the nursing notes.  Pertinent labs & imaging results that were available during my  care of the patient were reviewed by me and considered in my medical decision making (see chart for details).    MDM Rules/Calculators/A&P                           [age]ar old male arrives via EMS after fall in the shower now with left-sided weakness of the face, upper and lower extremities as well as slurred speech.  Last known well time of 11 AM, no obvious trauma to the head but patient did hit  his head and had brief loss of consciousness during mechanical fall in the shower.  Patient was not activated as a code stroke or trauma with EMS but upon my evaluation at bedside patient activated as code stroke.  Taken directly to CT.  He does not have obvious signs of trauma from fall, he is in c-collar but does not have midline C-spine tenderness.  Dr. Curly Shores with neurology met patient at bedside and CT evaluation.  Labs ordered as well as CT of the head without contrast and CTAs of the head and neck with perfusion study.  Concern for potential head trauma and bleed versus stroke, stroke could have led to fall as patient reports he had difficulty getting out of the shower due to left-sided weakness.  No prior history of stroke or known comorbidities.  Glucose is mildly elevated at 131, lab work with mild leukocytosis and elevated hemoglobin, no significant electrolyte derangements.  The rest of patient's lab work is pending  CT of the head with no evidence of bleed or trauma, hyperdense vessel noted in the region of the right MCA by neuro concerning for potential ischemic MCA stroke.  This is confirmed on CTA of the head and neck with perfusion study.  There is concern for dissection of the right internal carotid.  Patient will be taken directly to interventional radiology with neurology and will be admitted to the neuro ICU.  CT of the cervical spine without acute cervical injury from fall and patient without other signs of trauma or injury from fall on exam.   Final Clinical Impression(s) / ED  Diagnoses Final diagnoses:  Fall, initial encounter  Cerebrovascular accident (CVA), unspecified mechanism [facility])    Rx / [place] Orders ED Discharge Orders     None        Janet Berlin 02/14/21 2355    Tegeler, Gwenyth Allegra, MD 02/17/21 [phone]

## 2021-02-14 NOTE — Anesthesia Preprocedure Evaluation (Addendum)
Anesthesia Evaluation  Patient identified by MRN, date of birth, ID band  Reviewed: Allergy & Precautions, Patient's Chart, lab work & pertinent test resultsPreop documentation limited or incomplete due to emergent nature of procedure.  Airway Mallampati: IV  TM Distance: >3 FB Neck ROM: Limited    Dental   Pulmonary former smoker,    Pulmonary exam normal breath sounds clear to auscultation       Cardiovascular Normal cardiovascular exam Rhythm:Regular Rate:Normal  ECG: SR, rate 77  Right carotid artery occlusion   Neuro/Psych  C-spine not cleared    GI/Hepatic (+)     substance abuse  ,   Endo/Other    Renal/GU      Musculoskeletal   Abdominal   Peds  Hematology   Anesthesia Other Findings code stroke  Reproductive/Obstetrics                            Anesthesia Physical Anesthesia Plan  ASA: 4 and emergent  Anesthesia Plan: General   Post-op Pain Management:    Induction: Intravenous and Rapid sequence  PONV Risk Score and Plan: 2 and Ondansetron, Dexamethasone and Treatment may vary due to age or medical condition  Airway Management Planned: Oral ETT  Additional Equipment: Arterial line  Intra-op Plan:   Post-operative Plan: Possible Post-op intubation/ventilation  Informed Consent:     Only emergency history available  Plan Discussed with: CRNA  Anesthesia Plan Comments:        Anesthesia Quick Evaluation

## 2021-02-14 NOTE — Code Documentation (Addendum)
Responded to Code Stroke called on pt already in the ED at 2152 for L sided weakness, LSN-[id number]. Per EMS, pt slipped and fell in shower. Pt arrived at 2137 with R sided gaze, L sided weakness, slurred speech, and L facial droop, NIH-12, CBG-127, CT head-negative for hemorrhage, CTA-Suspected dissection of the distal right internal carotid artery beginning at the distal cavernous segment, which becomes occluded proximal to the carotid terminus. The right middle and anterior cerebral arteries fill via flow across the anterior communicating artery,  CTP-120cc penumbra with no core infarct. IR paged out at 2215. Pt arrived in IR at 2235.

## 2021-02-14 NOTE — ED Notes (Signed)
Wife at bedside. Reports that pt stayed home from work today because of a HA. Pt reports was in the shower and felt weak and fell. LOC for a period of time but then walked back to room to get in bed to try to sleep it off. Pt stated it was hard because of the L side weakness in leg and arm. Pt A&O x4, following commands. Denies HA/Neck or back pain aside from C -collar.

## 2021-02-15 ENCOUNTER — Inpatient Hospital Stay (HOSPITAL_COMMUNITY): Payer: BC Managed Care – PPO

## 2021-02-15 ENCOUNTER — Other Ambulatory Visit (HOSPITAL_COMMUNITY): Payer: Self-pay

## 2021-02-15 DIAGNOSIS — I639 Cerebral infarction, unspecified: Secondary | ICD-10-CM

## 2021-02-15 DIAGNOSIS — I6389 Other cerebral infarction: Secondary | ICD-10-CM

## 2021-02-15 DIAGNOSIS — J9600 Acute respiratory failure, unspecified whether with hypoxia or hypercapnia: Secondary | ICD-10-CM

## 2021-02-15 HISTORY — PX: IR ANGIO VERTEBRAL SEL VERTEBRAL BILAT MOD SED: IMG5369

## 2021-02-15 HISTORY — PX: IR CT HEAD LTD: IMG2386

## 2021-02-15 HISTORY — PX: IR US GUIDE VASC ACCESS RIGHT: IMG2390

## 2021-02-15 HISTORY — PX: IR PERCUTANEOUS ART THROMBECTOMY/INFUSION INTRACRANIAL INC DIAG ANGIO: IMG6087

## 2021-02-15 HISTORY — PX: IR ANGIO INTRA EXTRACRAN SEL COM CAROTID INNOMINATE UNI L MOD SED: IMG5358

## 2021-02-15 LAB — URINALYSIS, ROUTINE W REFLEX MICROSCOPIC
Bilirubin Urine: NEGATIVE
Glucose, UA: NEGATIVE mg/dL
Hgb urine dipstick: NEGATIVE
Ketones, ur: 80 mg/dL — AB
Leukocytes,Ua: NEGATIVE
Nitrite: NEGATIVE
Protein, ur: NEGATIVE mg/dL
Specific Gravity, Urine: >1.046 — ABNORMAL HIGH (ref 1.005–1.030)
pH: 6 (ref 5.0–8.0)

## 2021-02-15 LAB — POCT I-STAT 7, (LYTES, BLD GAS, ICA,H+H)
Acid-Base Excess: 3 mmol/L — ABNORMAL HIGH (ref 0.0–2.0)
Bicarbonate: 28.9 mmol/L — ABNORMAL HIGH (ref 20.0–28.0)
Calcium, Ion: 1.21 mmol/L (ref 1.15–1.40)
HCT: 47 % (ref 39.0–52.0)
Hemoglobin: 16 g/dL (ref 13.0–17.0)
O2 Saturation: 99 %
Patient temperature: 98
Potassium: 3.5 mmol/L (ref 3.5–5.1)
Sodium: 139 mmol/L (ref 135–145)
TCO2: 30 mmol/L (ref 22–32)
pCO2 arterial: 48.3 mmHg — ABNORMAL HIGH (ref 32.0–48.0)
pH, Arterial: 7.384 (ref 7.350–7.450)
pO2, Arterial: 159 mmHg — ABNORMAL HIGH (ref 83.0–108.0)

## 2021-02-15 LAB — TSH: TSH: 3.176 u[IU]/mL (ref 0.350–4.500)

## 2021-02-15 LAB — ECHOCARDIOGRAM COMPLETE
AR max vel: 2.56 cm2
AV Area VTI: 2.51 cm2
AV Area mean vel: 2.51 cm2
AV Mean grad: 3 mmHg
AV Peak grad: 5.1 mmHg
Ao pk vel: 1.13 m/s
Area-P 1/2: 3.86 cm2
Height: 68 in
S' Lateral: 2.9 cm
Single Plane A4C EF: 66.7 %
Weight: 3680 oz

## 2021-02-15 LAB — HEMOGLOBIN A1C
Hgb A1c MFr Bld: 5 % (ref 4.8–5.6)
Mean Plasma Glucose: 96.8 mg/dL

## 2021-02-15 LAB — ANTITHROMBIN III: AntiThromb III Func: 102 % (ref 75–120)

## 2021-02-15 LAB — HEPARIN LEVEL (UNFRACTIONATED): Heparin Unfractionated: <0.1 IU/mL — ABNORMAL LOW (ref 0.30–0.70)

## 2021-02-15 LAB — MRSA NEXT GEN BY PCR, NASAL: MRSA by PCR Next Gen: NOT DETECTED

## 2021-02-15 LAB — LIPID PANEL
Cholesterol: 197 mg/dL (ref 0–200)
HDL: 33 mg/dL — ABNORMAL LOW (ref >40)
LDL Cholesterol: 126 mg/dL — ABNORMAL HIGH (ref 0–99)
Total CHOL/HDL Ratio: 6 RATIO
Triglycerides: 190 mg/dL — ABNORMAL HIGH (ref <150)
VLDL: 38 mg/dL (ref 0–40)

## 2021-02-15 LAB — RAPID URINE DRUG SCREEN, HOSP PERFORMED
Amphetamines: NOT DETECTED
Barbiturates: NOT DETECTED
Benzodiazepines: NOT DETECTED
Cocaine: POSITIVE — AB
Opiates: NOT DETECTED
Tetrahydrocannabinol: POSITIVE — AB

## 2021-02-15 LAB — TROPONIN I (HIGH SENSITIVITY): Troponin I (High Sensitivity): 5 ng/L (ref <18)

## 2021-02-15 LAB — RPR: RPR Ser Ql: NONREACTIVE

## 2021-02-15 LAB — HIV ANTIBODY (ROUTINE TESTING W REFLEX): HIV Screen 4th Generation wRfx: NONREACTIVE

## 2021-02-15 LAB — SEDIMENTATION RATE: Sed Rate: 2 mm/hr (ref 0–16)

## 2021-02-15 IMAGING — MR MR HEAD W/O CM
9 series · 48 of 48 positions shown · non-contrast
Comparison: No pertinent prior exam.

:
EXAM:

MRI HEAD WITHOUT CONTRAST
MRA HEAD WITHOUT CONTRAST
TECHNIQUE: Multiplanar, multi-echo pulse sequences of the brain and surrounding
structures were acquired without intravenous contrast. Angiographic
images of the Circle of Willis were acquired using MRA technique
without intravenous contrast.

[Series 5: DWI · axial · 3.0mm · 0.96mm/px · z∈[-76,+110]mm · 11 of 140 slices shown (1 of 2)]
[im 1/140]
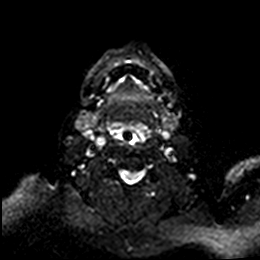
[im 14/140]
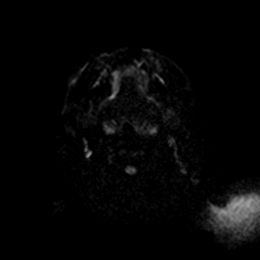
[im 28/140]
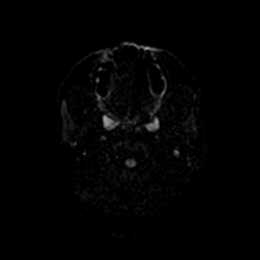
[im 42/140]
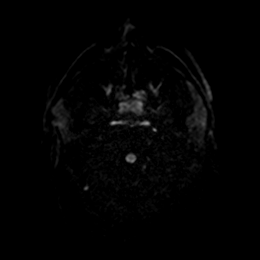
[im 56/140]
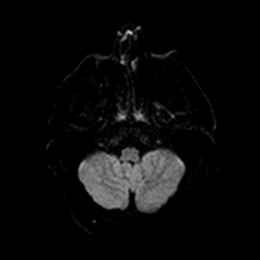
[im 70/140]
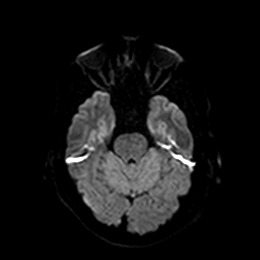
[im 84/140]
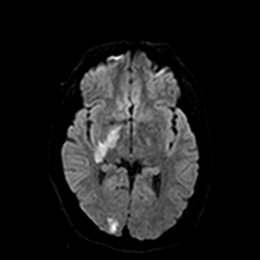
[im 98/140]
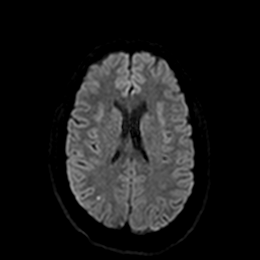
[im 112/140]
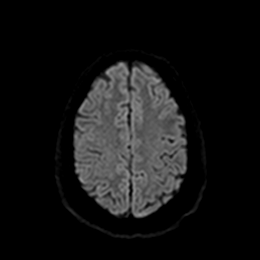
[im 126/140]
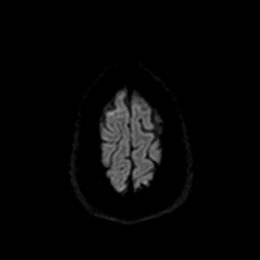
[im 140/140]
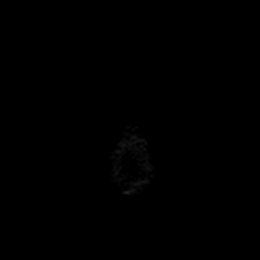

[Series 6: DWI · axial · 3.0mm · 0.96mm/px · z∈[-76,+110]mm · 6 of 70 slices shown (2 of 2)]
[im 1/70]
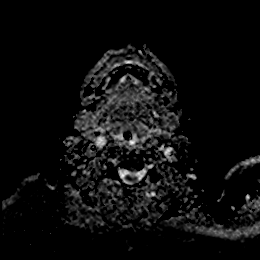
[im 14/70]
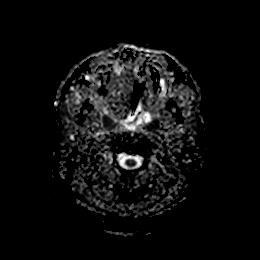
[im 28/70]
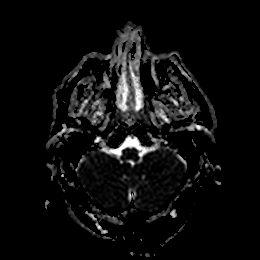
[im 42/70]
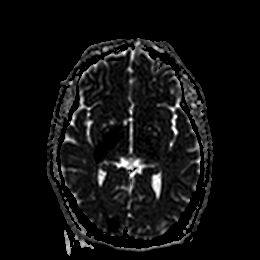
[im 56/70]
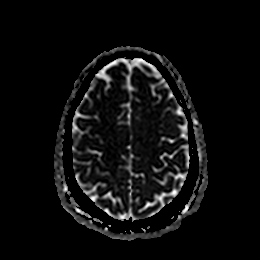
[im 70/70]
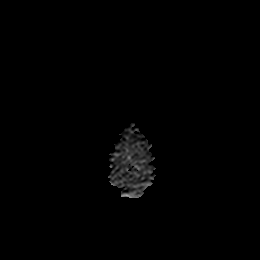

[Series 7: T2 · axial · 5.0mm · 0.78mm/px · z∈[-99,+115]mm · 3 of 40 slices shown]
[im 1/40]
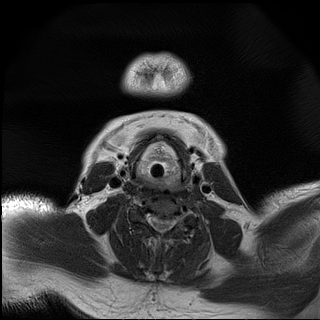
[im 20/40]
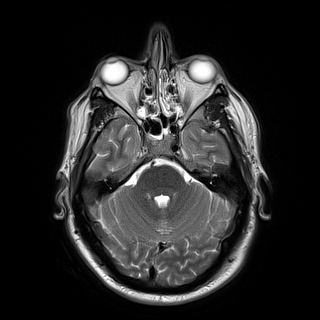
[im 40/40]
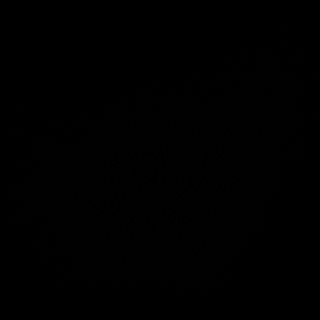

[Series 8: mag_images · axial · 3.0mm · 0.98mm/px · z∈[-81,+110]mm · 6 of 72 slices shown]
[im 1/72]
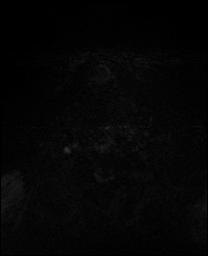
[im 15/72]
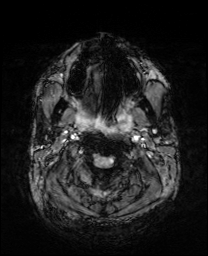
[im 29/72]
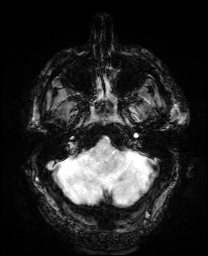
[im 43/72]
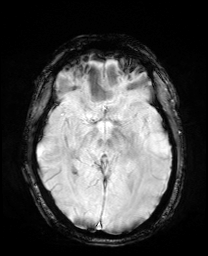
[im 57/72]
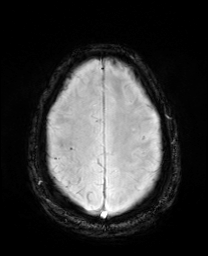
[im 72/72]
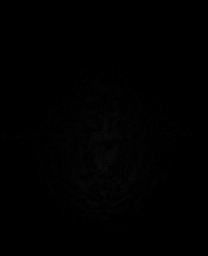

[Series 9: pha_images · axial · 3.0mm · 0.98mm/px · z∈[-81,+102]mm · 5 of 69 slices shown]
[im 1/69]
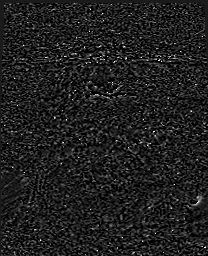
[im 18/69]
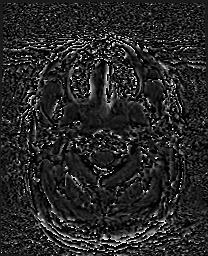
[im 35/69]
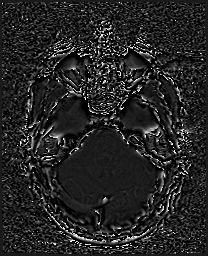
[im 52/69]
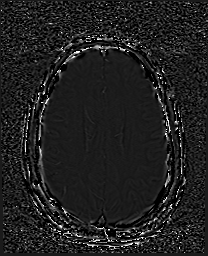
[im 69/69]
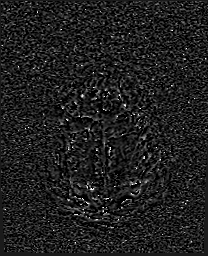

[Series 10: swi_images · axial · 3.0mm · 0.98mm/px · z∈[-81,+110]mm · 6 of 72 slices shown]
[im 1/72]
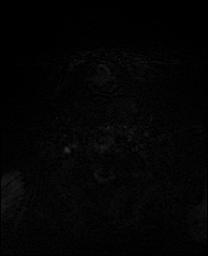
[im 15/72]
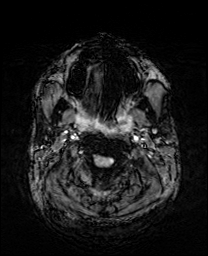
[im 29/72]
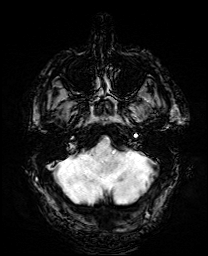
[im 43/72]
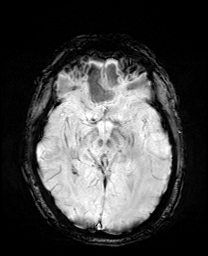
[im 57/72]
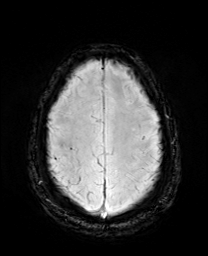
[im 72/72]
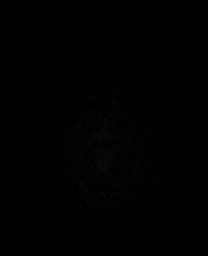

[Series 11: mip_images(sw) · axial · 24.0mm · 0.98mm/px · z∈[-72,+101]mm · 5 of 65 slices shown]
[im 1/65]
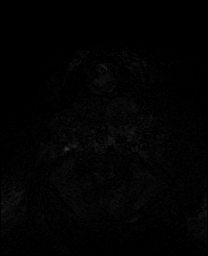
[im 17/65]
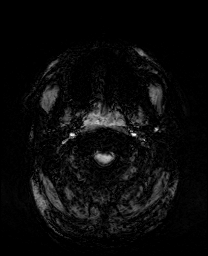
[im 33/65]
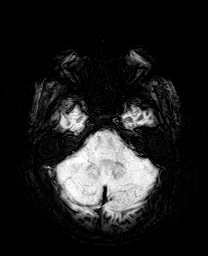
[im 49/65]
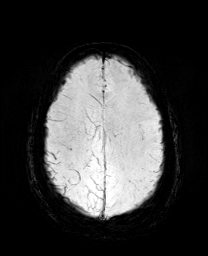
[im 65/65]
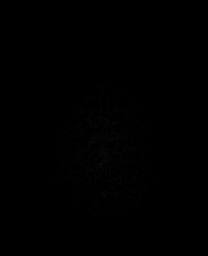

[Series 12: T1 fat-sat · axial · 5.0mm · 0.43mm/px · z∈[-99,+110]mm · 3 of 39 slices shown]
[im 1/39]
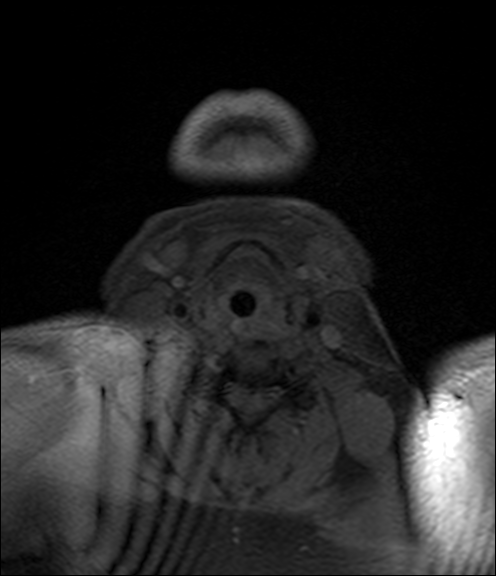
[im 20/39]
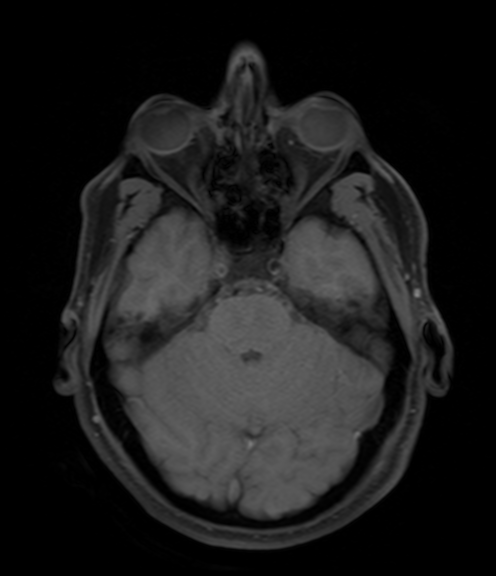
[im 39/39]
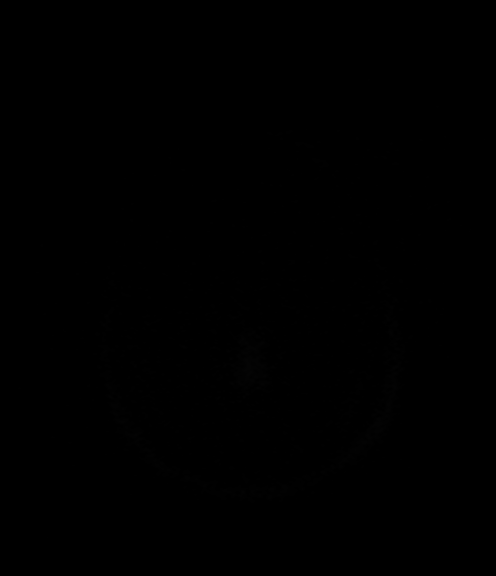

[Series 13: FLAIR · axial · 5.0mm · 0.49mm/px · z∈[-102,+112]mm · 3 of 40 slices shown]
[im 1/40]
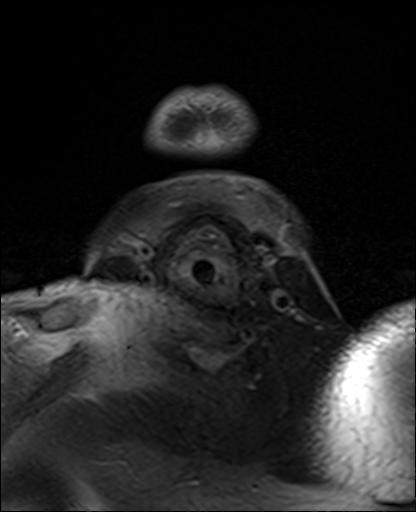
[im 20/40]
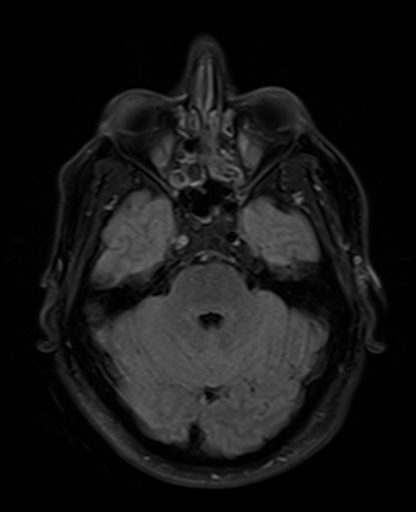
[im 40/40]
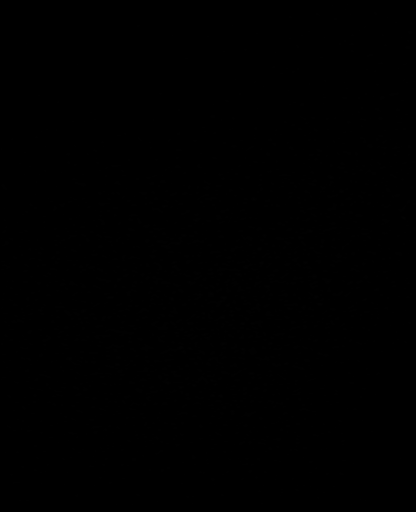

[48 of 48 positions shown; findings below may reference images not displayed]

FINDINGS: MRI HEAD FINDINGS

A tailored MRI protocol was used, consisting of susceptibility

weighted images, diffusion-weighted images, fat-suppressed axial

T1-weighted images, axial T2-weighted images and axial FLAIR

sequence.

There is multifocal acute ischemia within the right PCA territory,

involving the right occipital lobe, medial right temporal lobe and

the ventrolateral thalamus. There is a punctate focus of acute

ischemia in the right parietal white matter.

The right internal carotid artery flow void is abnormal beginning at

the level of the C2-3 disc space and continuing to the carotid

terminus. Susceptibility weighted imaging shows magnetic

susceptibility effect within the portion of the right internal

carotid artery corresponding to the occluded area on the earlier CTA

of the head and neck and also corresponding to the area of the

abnormal T2-weighted imaging flow void.

MRA HEAD FINDINGS

POSTERIOR CIRCULATION:

--Vertebral arteries: Normal

--Inferior cerebellar arteries: Normal.

--Basilar artery: Normal.

--Superior cerebellar arteries: Normal.

--Posterior cerebral arteries: There is partial occlusion of the

right P1 segment. There is a small right posterior communicating

artery that is also partially occluded. Left PCA is normal. There is

reconstitution of the right PCA at the mid P2 segment.

ANTERIOR CIRCULATION:

--Intracranial internal carotid arteries: Diminished flow related

enhancement of the distal right ICA loss of enhancement at

approximately the level of the ophthalmic artery. Left ICA is

normal.

--Anterior cerebral arteries (ACA): Normal. Both A1 segments are

present. The anterior communicating artery is patent.

--Middle cerebral arteries (MCA): Normal. The right MCA fills via

flow across the anterior pathway of the circle-of-Willis.

Fat-suppressed T1-weighted imaging shows no evidence of intramural

thrombus within the right ICA.
IMPRESSION: 1. Multifocal acute ischemia within the right PCA territory,

involving the right occipital lobe, medial right temporal lobe and

the ventrolateral thalamus/posterior limb of the internal capsule.

The thalamocapsular infarct could affect the optic radiation.

2. Occlusion of the distal right internal carotid artery, as

previously demonstrated by CTA.

3. Findings of this study favor thrombus within the distal right

ICA. No clear evidence of dissection.

4. Right PCA P1 segment occlusion.

5. The right MCA fills via flow across the anterior pathway of the

circle-of-Willis.

These results were called by telephone at the time of interpretation

on [DATE] at 2: 35 am to SHEERIEN. SHEERIEN and SHEERIEN,
who

verbally acknowledged these results.

## 2021-02-15 IMAGING — DX DG CHEST 1V PORT
1 series · 1 of 1 positions shown · non-contrast
Comparison: None.

CLINICAL DATA: Intubation

EXAM:
PORTABLE CHEST 1 VIEW

[chest]
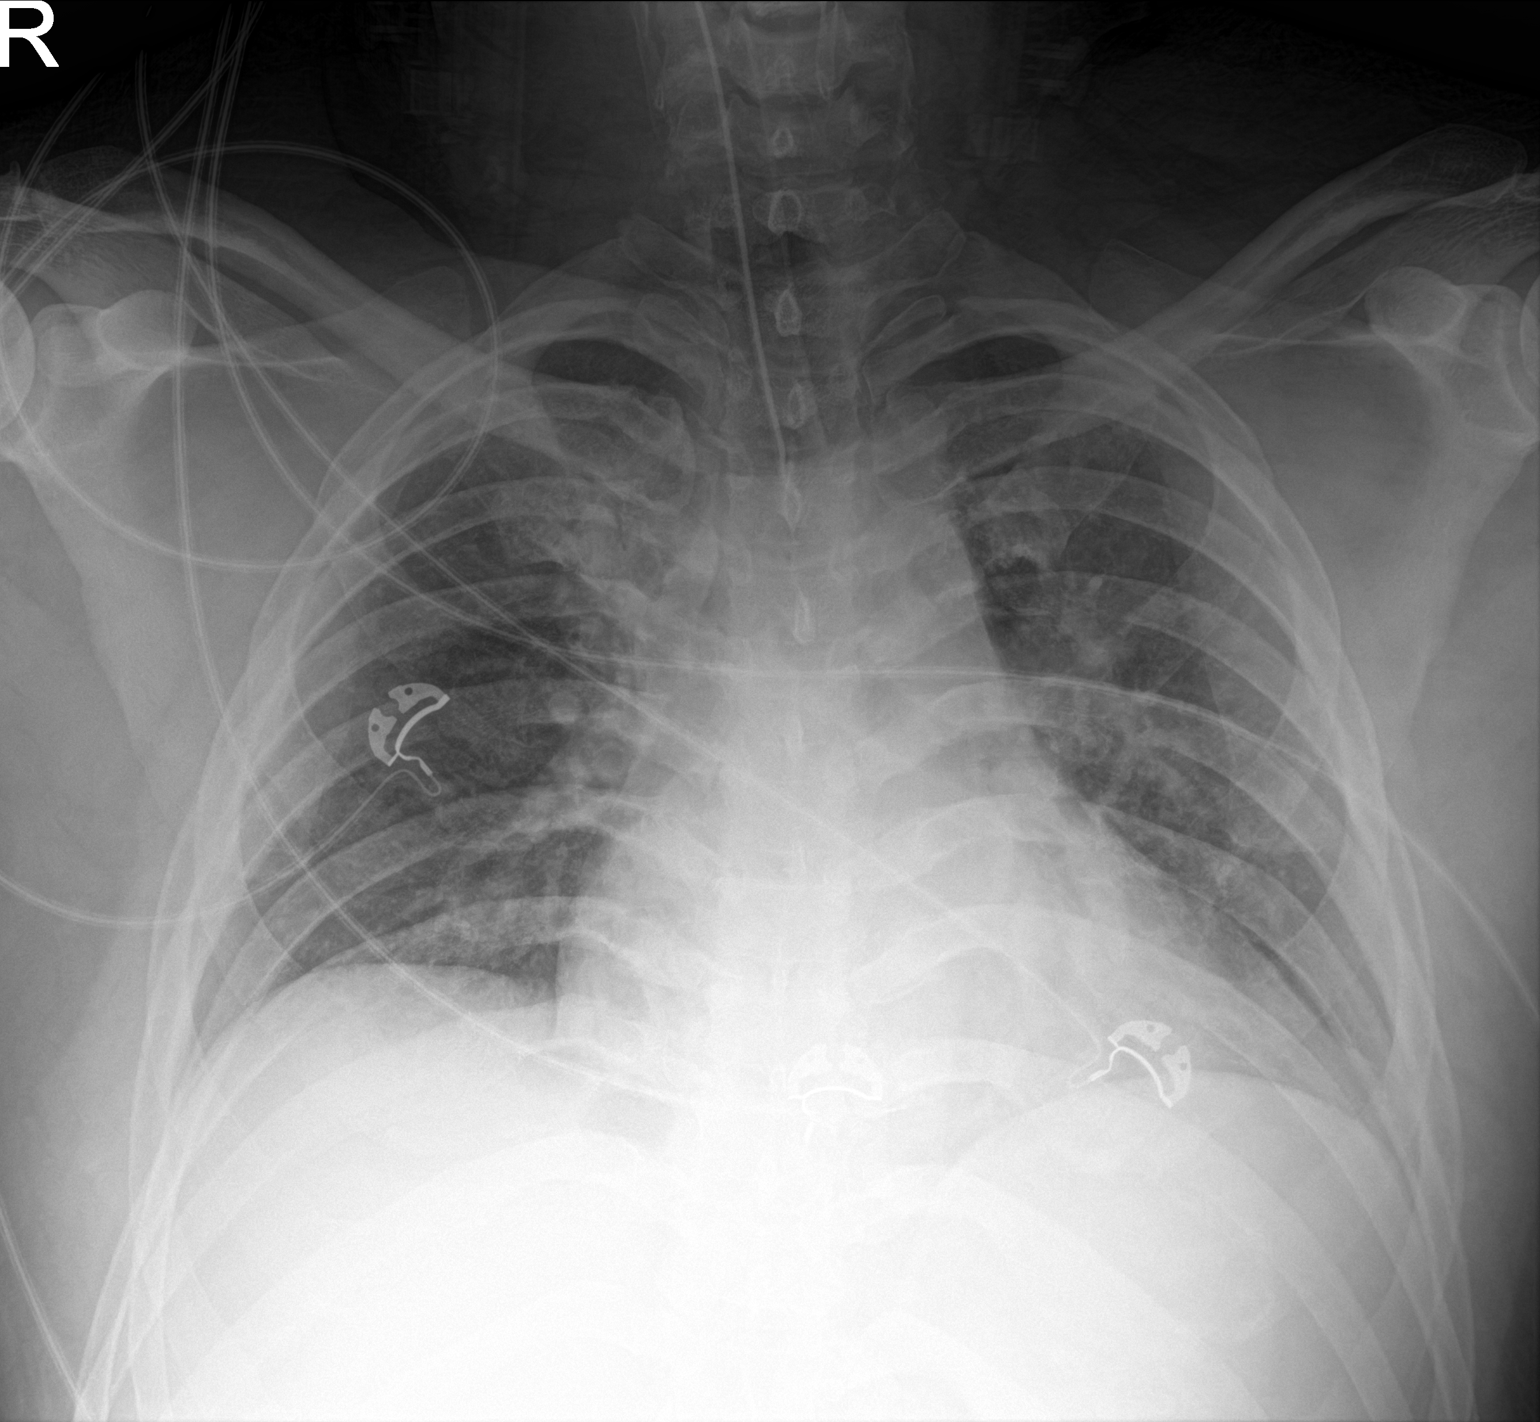

[1 of 1 positions shown; findings below may reference images not displayed]

FINDINGS: Endotracheal tube with tip at the clavicular heads. Low volume chest
with hazy and streaky density suggesting atelectasis. Accentuated
heart size by technique. No effusion or pneumothorax.
IMPRESSION: 1. Low volume chest with atelectasis.
2. Unremarkable endotracheal tube positioning.

## 2021-02-15 IMAGING — MR MR MRA HEAD W/O CM
2 of 3 series · 21 of 48 positions shown · non-contrast
Comparison: No pertinent prior exam.

EXAM:
MRI HEAD WITHOUT CONTRAST

MRA HEAD WITHOUT CONTRAST
TECHNIQUE: Multiplanar, multi-echo pulse sequences of the brain and surrounding
structures were acquired without intravenous contrast. Angiographic
images of the Circle of Willis were acquired using MRA technique
without intravenous contrast.

[Series 5: DWI · axial · 3.0mm · 0.96mm/px · z∈[-76,+110]mm · 14 of 140 slices shown (1 of 2)]
[im 1/140]
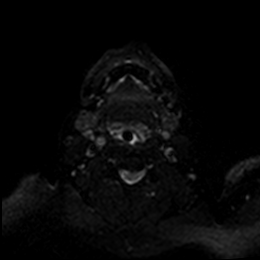
[im 11/140]
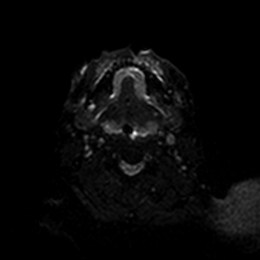
[im 22/140]
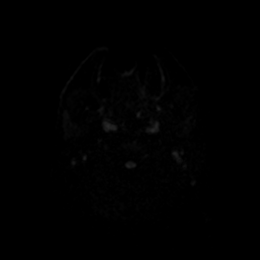
[im 33/140]
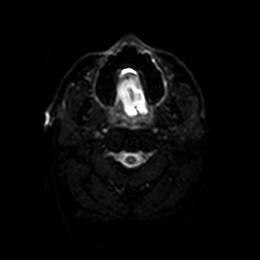
[im 43/140]
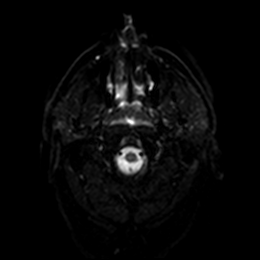
[im 54/140]
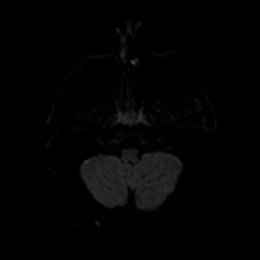
[im 65/140]
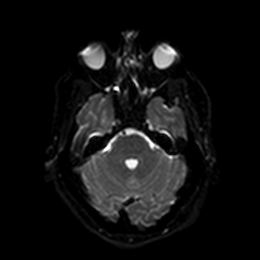
[im 75/140]
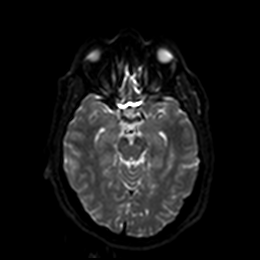
[im 86/140]
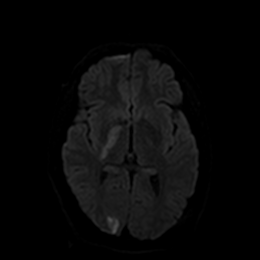
[im 97/140]
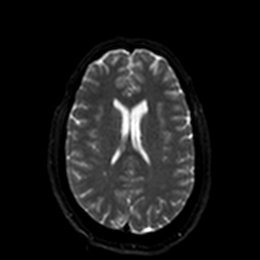
[im 107/140]
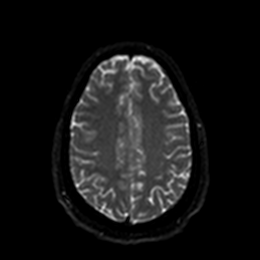
[im 118/140]
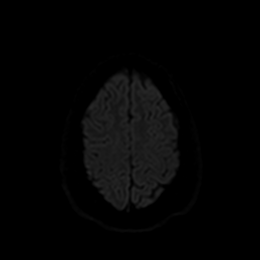
[im 129/140]
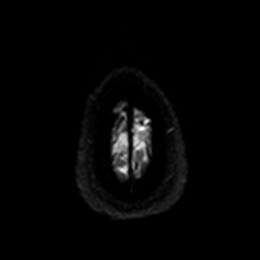
[im 140/140]
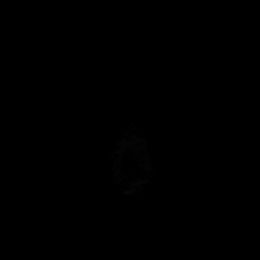

[Series 6: DWI · axial · 3.0mm · 0.96mm/px · z∈[-76,+110]mm · 7 of 70 slices shown (2 of 2)]
[im 1/70]
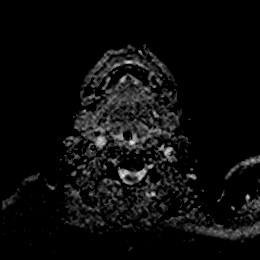
[im 12/70]
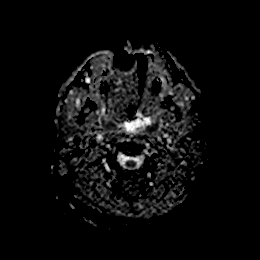
[im 24/70]
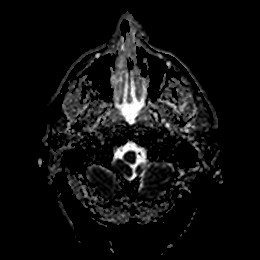
[im 35/70]
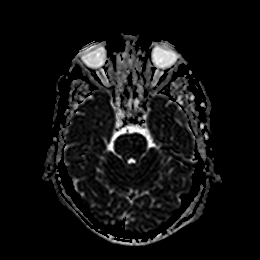
[im 47/70]
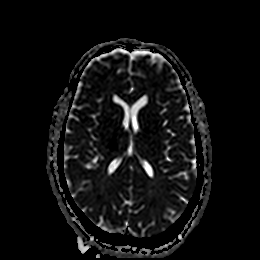
[im 58/70]
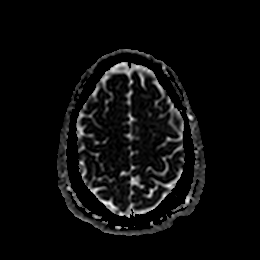
[im 70/70]
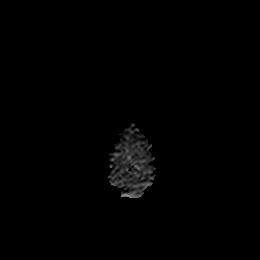

[21 of 48 positions shown; findings below may reference images not displayed]

FINDINGS: MRI HEAD FINDINGS

A tailored MRI protocol was used, consisting of susceptibility
weighted images, diffusion-weighted images, fat-suppressed axial
T1-weighted images, axial T2-weighted images and axial FLAIR
sequence.

There is multifocal acute ischemia within the right PCA territory,
involving the right occipital lobe, medial right temporal lobe and
the ventrolateral thalamus. There is a punctate focus of acute
ischemia in the right parietal white matter.

The right internal carotid artery flow void is abnormal beginning at
the level of the C2-3 disc space and continuing to the carotid
terminus. Susceptibility weighted imaging shows magnetic
susceptibility effect within the portion of the right internal
carotid artery corresponding to the occluded area on the earlier CTA
of the head and neck and also corresponding to the area of the
abnormal T2-weighted imaging flow void.

MRA HEAD FINDINGS

POSTERIOR CIRCULATION:

--Vertebral arteries: Normal

--Inferior cerebellar arteries: Normal.

--Basilar artery: Normal.

--Superior cerebellar arteries: Normal.

--Posterior cerebral arteries: There is partial occlusion of the
right P1 segment. There is a small right posterior communicating
artery that is also partially occluded. Left PCA is normal. There is
reconstitution of the right PCA at the mid P2 segment.

ANTERIOR CIRCULATION:

--Intracranial internal carotid arteries: Diminished flow related
enhancement of the distal right ICA loss of enhancement at
approximately the level of the ophthalmic artery. Left ICA is
normal.

--Anterior cerebral arteries (ACA): Normal. Both A1 segments are
present. The anterior communicating artery is patent.

--Middle cerebral arteries (MCA): Normal. The right MCA fills via
flow across the anterior pathway of the circle-of-Willis.

Fat-suppressed T1-weighted imaging shows no evidence of intramural
thrombus within the right ICA.
IMPRESSION: 1. Multifocal acute ischemia within the right PCA territory,
involving the right occipital lobe, medial right temporal lobe and
the ventrolateral thalamus/posterior limb of the internal capsule.
The thalamocapsular infarct could affect the optic radiation.
2. Occlusion of the distal right internal carotid artery, as
previously demonstrated by CTA.
3. Findings of this study favor thrombus within the distal right
ICA. No clear evidence of dissection.
4. Right PCA P1 segment occlusion.
5. The right MCA fills via flow across the anterior pathway of the
circle-of-Willis.

These results were called by telephone at the time of interpretation
on [DATE] at [DATE] to GOZYASI. GOZYASI and GOZYASI, who
verbally acknowledged these results.

## 2021-02-15 MED ORDER — POLYETHYLENE GLYCOL 3350 17 G PO PACK: 17.0000 g | PACK | Freq: Every day | ORAL | Status: DC

## 2021-02-15 MED ORDER — DOCUSATE SODIUM 50 MG/5ML PO LIQD: 100.0000 mg | Freq: Two times a day (BID) | ORAL | Status: DC

## 2021-02-15 MED ORDER — FENTANYL CITRATE PF 50 MCG/ML IJ SOSY: 50.0000 ug | PREFILLED_SYRINGE | INTRAMUSCULAR | Status: DC | PRN

## 2021-02-15 MED ORDER — PROPOFOL 1000 MG/100ML IV EMUL: 5.0000 ug/kg/min | INTRAVENOUS | Status: DC

## 2021-02-15 MED ORDER — FENTANYL CITRATE PF 50 MCG/ML IJ SOSY
PREFILLED_SYRINGE | INTRAMUSCULAR | Status: AC
  Administered 2021-02-15: 50 ug via INTRAVENOUS
  Filled 2021-02-15: qty 1

## 2021-02-15 MED ORDER — SODIUM CHLORIDE 0.9 % IV SOLN
250.0000 mL | INTRAVENOUS | Status: DC
  Administered 2021-02-16: 250 mL via INTRAVENOUS

## 2021-02-15 MED ORDER — ASPIRIN 81 MG PO CHEW
81.0000 mg | CHEWABLE_TABLET | Freq: Every day | ORAL | Status: DC
  Administered 2021-02-15 – 2021-02-17 (×3): 81 mg via ORAL
  Filled 2021-02-15 (×3): qty 1

## 2021-02-15 MED ORDER — HEPARIN (PORCINE) 25000 UT/250ML-% IV SOLN
1100.0000 [IU]/h | INTRAVENOUS | Status: DC
  Administered 2021-02-15: 1100 [IU]/h via INTRAVENOUS
  Filled 2021-02-15: qty 250

## 2021-02-15 MED ORDER — FENTANYL CITRATE PF 50 MCG/ML IJ SOSY: 50.0000 ug | PREFILLED_SYRINGE | Freq: Once | INTRAMUSCULAR | Status: DC

## 2021-02-15 MED ORDER — ORAL CARE MOUTH RINSE
15.0000 mL | OROMUCOSAL | Status: DC
  Administered 2021-02-15 (×3): 15 mL via OROMUCOSAL

## 2021-02-15 MED ORDER — PROPOFOL 1000 MG/100ML IV EMUL
0.0000 ug/kg/min | INTRAVENOUS | Status: DC
Start: 2021-02-15 — End: 2021-02-15
  Administered 2021-02-15: 40 ug/kg/min via INTRAVENOUS
  Administered 2021-02-15: 50 ug/kg/min via INTRAVENOUS
  Filled 2021-02-15: qty 100

## 2021-02-15 MED ORDER — FENTANYL BOLUS VIA INFUSION
50.0000 ug | INTRAVENOUS | Status: DC | PRN
  Filled 2021-02-15: qty 100

## 2021-02-15 MED ORDER — ENOXAPARIN SODIUM 40 MG/0.4ML IJ SOSY
40.0000 mg | PREFILLED_SYRINGE | INTRAMUSCULAR | Status: DC
  Administered 2021-02-15 – 2021-02-21 (×7): 40 mg via SUBCUTANEOUS
  Filled 2021-02-15 (×6): qty 0.4

## 2021-02-15 MED ORDER — ORAL CARE MOUTH RINSE
15.0000 mL | Freq: Two times a day (BID) | OROMUCOSAL | Status: DC
  Administered 2021-02-15 – 2021-02-20 (×9): 15 mL via OROMUCOSAL

## 2021-02-15 MED ORDER — CHLORHEXIDINE GLUCONATE CLOTH 2 % EX PADS
6.0000 | MEDICATED_PAD | Freq: Every day | CUTANEOUS | Status: DC
  Administered 2021-02-16 – 2021-02-20 (×4): 6 via TOPICAL

## 2021-02-15 MED ORDER — DEXMEDETOMIDINE HCL IN NACL 400 MCG/100ML IV SOLN
0.4000 ug/kg/h | INTRAVENOUS | Status: DC
  Filled 2021-02-15: qty 100

## 2021-02-15 MED ORDER — IOHEXOL 350 MG/ML SOLN
100.0000 mL | Freq: Once | INTRAVENOUS | Status: AC | PRN
  Administered 2021-02-15: 50 mL via INTRA_ARTERIAL

## 2021-02-15 MED ORDER — PROPOFOL 1000 MG/100ML IV EMUL
INTRAVENOUS | Status: AC
  Filled 2021-02-15: qty 100

## 2021-02-15 MED ORDER — CLOPIDOGREL BISULFATE 75 MG PO TABS
75.0000 mg | ORAL_TABLET | Freq: Every day | ORAL | Status: DC
  Administered 2021-02-15 – 2021-02-22 (×8): 75 mg via ORAL
  Filled 2021-02-15 (×8): qty 1

## 2021-02-15 MED ORDER — CLEVIDIPINE BUTYRATE 0.5 MG/ML IV EMUL
INTRAVENOUS | Status: AC
  Filled 2021-02-15: qty 50

## 2021-02-15 MED ORDER — PROPOFOL 500 MG/50ML IV EMUL
INTRAVENOUS | Status: DC | PRN
  Administered 2021-02-15: 40 ug/kg/min via INTRAVENOUS

## 2021-02-15 MED ORDER — PHENYLEPHRINE HCL-NACL 20-0.9 MG/250ML-% IV SOLN
25.0000 ug/min | INTRAVENOUS | Status: DC
  Administered 2021-02-15: 25 ug/min via INTRAVENOUS
  Filled 2021-02-15: qty 250

## 2021-02-15 MED ORDER — ONDANSETRON HCL 4 MG/2ML IJ SOLN
4.0000 mg | Freq: Four times a day (QID) | INTRAMUSCULAR | Status: DC | PRN
  Administered 2021-02-15: 4 mg via INTRAVENOUS
  Filled 2021-02-15: qty 2

## 2021-02-15 MED ORDER — CHLORHEXIDINE GLUCONATE 0.12% ORAL RINSE (MEDLINE KIT)
15.0000 mL | Freq: Two times a day (BID) | OROMUCOSAL | Status: DC
  Administered 2021-02-15: 15 mL via OROMUCOSAL

## 2021-02-15 MED ORDER — CLEVIDIPINE BUTYRATE 0.5 MG/ML IV EMUL: 0.0000 mg/h | INTRAVENOUS | Status: DC

## 2021-02-15 MED ORDER — FENTANYL 2500MCG IN NS 250ML (10MCG/ML) PREMIX INFUSION
0.0000 ug/h | INTRAVENOUS | Status: DC
  Administered 2021-02-15: 100 ug/h via INTRAVENOUS
  Filled 2021-02-15: qty 250

## 2021-02-15 MED ORDER — ATORVASTATIN CALCIUM 80 MG PO TABS
80.0000 mg | ORAL_TABLET | Freq: Every day | ORAL | Status: DC
  Administered 2021-02-15 – 2021-02-21 (×7): 80 mg via ORAL
  Filled 2021-02-15 (×7): qty 1

## 2021-02-15 MED ORDER — ONDANSETRON HCL 4 MG/2ML IJ SOLN
INTRAMUSCULAR | Status: DC | PRN
  Administered 2021-02-15: 4 mg via INTRAVENOUS

## 2021-02-15 NOTE — Progress Notes (Signed)
Patient transported from 4N25 to MRI and back with no complications noted. °

## 2021-02-15 NOTE — Progress Notes (Signed)
ANTICOAGULATION CONSULT NOTE - Initial Consult  Pharmacy Consult for heparin Indication: stroke  No Known Allergies  Patient Measurements: Height: 5\' 8"  (172.7 cm) Weight: 104.3 kg (230 lb) IBW/kg (Calculated) : 68.4 Heparin Dosing Weight: 91.1 kg  Vital Signs: Temp: 97.9 F (36.6 C) (09/14 2148) Temp Source: Oral (09/14 2148) BP: 141/90 (09/15 0101) Pulse Rate: 86 (09/15 0101)  Labs: Recent Labs    02/14/21 2150 02/14/21 2209  HGB 17.9* 17.3*  HCT 50.4 51.0  PLT 176  --   APTT 27  --   LABPROT 13.6  --   INR 1.0  --   CREATININE 0.70 0.70    Estimated Creatinine Clearance: 148.1 mL/min (by C-G formula based on SCr of 0.7 mg/dL).   Medical History: No past medical history on file.  Medications:  Infusions:   sodium chloride     clevidipine     dexmedetomidine (PRECEDEX) IV infusion     fentaNYL infusion INTRAVENOUS 100 mcg/hr (02/15/21 0317)   propofol (DIPRIVAN) infusion      Assessment: 35 year-old male who presents for evaluation after a fall with acute left-sided weakness, secondary to right ICA occlusion at the skull base. Patient not on anticoagulation PTA. Pharmacy was consulted to dose heparin for possible carotid dissection with low goal, no bolus protocol.   CBC stable - hemoglobin is slightly above upper limit of normal at 17.9 (BL unknown), platelets wnl at 176.  Goal of Therapy:  Heparin level 0.3-0.5 units/ml Monitor platelets by anticoagulation protocol: Yes   Plan: Start heparin infusion at 1100 units/hr Check heparin level in 6 hours Daily CBC, heparin level Monitor for s/sx of bleeding  30, Student Pharmacist

## 2021-02-15 NOTE — Progress Notes (Signed)
PT Cancellation Note  Patient Details Name: David Craig MRN: [id number] DOB: [birth year]   Cancelled Treatment:    Reason Eval/Treat Not Completed: Active bedrest order Pt on strict bedrest and note from yesterday states may try to extubate today. Will await increased activity orders.  Edee Nifong A. Dan Humphreys PT, DPT Acute Rehabilitation Services Pager [phone] Office [phone]   Viviann Spare 02/15/2021, 8:29 AM

## 2021-02-15 NOTE — Progress Notes (Signed)
160 cc Fentanyl was wasted with Sherral Hammers

## 2021-02-15 NOTE — Evaluation (Signed)
Physical Therapy Evaluation Patient Details Name: David Craig MRN: [id number] DOB: Mar 03, 1986 Today's Date: 02/15/2021  History of Present Illness  David Craig is a 35 y.o. male admitted s/p fall in shower with left side weakness. MRI; acute right PCA stroke with additional right carotid terminus thrombus without MCA territory ischemia. PHMx:polysubstance abuse (cocaine, tobacco, vaping, oxycodone), migraine headaches.  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 Patient presents with L hemiplegia, impaired balance, decreased activity tolerance, impaired coordination, and impaired functional mobility. Patient requires modA+2 for bed mobility and sit to stand transfer. Patient able to lateral scoot to R towards recliner with minA+2. Patient ultimate goal is to be able to walk. Wife and mom are very supportive. Educated wife and mom on PROM to L LE and exercises for R LE while supine/sitting in recliner, both verbalized understanding. Patient will benefit from skilled PT services during acute stay to address listed deficits. Recommend CIR for intensive therapies to assist with maximize functional mobility and safety.        Recommendations for follow up therapy are one component of a multi-disciplinary discharge planning process, led by the attending physician.  Recommendations may be updated based on patient status, additional functional criteria and insurance authorization.  Follow Up Recommendations CIR    Equipment Recommendations  Other (comment) (TBD)    Recommendations for Other Services Rehab consult     Precautions / Restrictions Precautions Precautions: Fall Precaution Comments: L hemi Required Braces or Orthoses: Cervical Brace Cervical Brace: Hard collar (discontinued by MD at end of therapy eval) Restrictions Weight Bearing Restrictions: No      Mobility  Bed Mobility Overal bed  mobility: Needs Assistance Bed Mobility: Supine to Sit     Supine to sit: Mod assist;+2 for physical assistance;+2 for safety/equipment     General bed mobility comments: Patient able to advance R LE off bed and assist with trunk elevation. Requires assistance for bringing L LE off bed and trunk elevation    Transfers Overall transfer level: Needs assistance Equipment used: 2 person hand held assist Transfers: Sit to/from Stand;Lateral/Scoot Transfers Sit to Stand: Mod assist;+2 physical assistance;+2 safety/equipment        Lateral/Scoot Transfers: Min assist;+2 physical assistance;+2 safety/equipment General transfer comment: modA+2 to stand from EOB with HHAx2 with L knee blocked to prevent buckling. MinA+2 for lateral scoot towards R to recliner. Cues required for placement of R hand  Ambulation/Gait             General Gait Details: unable at this time  Stairs            Wheelchair Mobility    Modified Rankin (Stroke Patients Only) Modified Rankin (Stroke Patients Only) Pre-Morbid Rankin Score: No symptoms Modified Rankin: Severe disability     Balance Overall balance assessment: Needs assistance Sitting-balance support: Single extremity supported;Feet supported Sitting balance-Leahy Scale: Poor Sitting balance - Comments: requires up to modA to maintain sitting balance. Patient with L lateral lean Postural control: Left lateral lean Standing balance support: Bilateral upper extremity supported Standing balance-Leahy Scale: Poor Standing balance comment: requires +2 assist to maintain standing                             Pertinent Vitals/Pain Pain Assessment: Faces Faces Pain Scale: No hurt Pain Intervention(s): Monitored during session    Home Living Family/patient expects to be discharged to::  Private residence Living Arrangements: Spouse/significant other;Children (1 and 3 yo) Available Help at Discharge: Family;Available 24  hours/day Type of Home: House Home Access: Stairs to enter Entrance Stairs-Rails: Left Entrance Stairs-Number of Steps: 4 Home Layout: One level Home Equipment: Hand held shower head Additional Comments: 2 children - 51 and 78 years old    Prior Function Level of Independence: Independent         Comments: working in Airline pilot for [employer]     Hand Dominance   Dominant Hand: Right    Extremity/Trunk Assessment   Upper Extremity Assessment Upper Extremity Assessment: Defer to OT evaluation    Lower Extremity Assessment Lower Extremity Assessment: LLE deficits/detail LLE Deficits / Details: 0/5 LLE Sensation: WNL LLE Coordination: decreased fine motor;decreased gross motor    Cervical / Trunk Assessment Cervical / Trunk Assessment: Normal  Communication   Communication: No difficulties  Cognition Arousal/Alertness: Lethargic Behavior During Therapy: Flat affect;Impulsive Overall Cognitive Status: Impaired/Different from baseline Area of Impairment: Attention;Memory;Following commands;Safety/judgement;Awareness;Problem solving                   Current Attention Level: Sustained Memory: Decreased short-term memory Following Commands: Follows one step commands with increased time Safety/Judgement: Decreased awareness of safety;Decreased awareness of deficits Awareness: Emergent Problem Solving: Slow processing;Requires verbal cues General Comments: impulsive prior to OOB mobility but responds well to cues to slow pace and wait for assistance due to L sided deficits. Decreased awareness of deficits and safety.      General Comments General comments (skin integrity, edema, etc.): Recently extubated prior to arrival and placed on 4L O2 [place] with spO2 94%. Removed O2 with patient able to maintain >90% throughout session. Donned 2L O2 at end of session for comfort    Exercises     Assessment/Plan    PT Assessment Patient needs continued PT services  PT Problem  List Decreased strength;Decreased balance;Decreased activity tolerance;Decreased coordination;Decreased mobility;Decreased cognition;Decreased knowledge of use of DME;Decreased safety awareness;Decreased knowledge of precautions       PT Treatment Interventions DME instruction;Gait training;Stair training;Functional mobility training;Therapeutic activities;Therapeutic exercise;Balance training;Neuromuscular re-education;Patient/family education;Wheelchair mobility training    PT Goals (Current goals can be found in the Care Plan section)  Acute Rehab PT Goals Patient Stated Goal: to be able to walk PT Goal Formulation: With patient/family Time For Goal Achievement: 03/01/21 Potential to Achieve Goals: Good    Frequency Min 4X/week   Barriers to discharge        Co-evaluation   Reason for Co-Treatment: Necessary to address cognition/behavior during functional activity;For patient/therapist safety;To address functional/ADL transfers PT goals addressed during session: Mobility/safety with mobility;Balance;Strengthening/ROM OT goals addressed during session: Strengthening/ROM;ADL's and self-care       AM-PAC PT "6 Clicks" Mobility  Outcome Measure Help needed turning from your back to your side while in a flat bed without using bedrails?: Total Help needed moving from lying on your back to sitting on the side of a flat bed without using bedrails?: Total Help needed moving to and from a bed to a chair (including a wheelchair)?: Total Help needed standing up from a chair using your arms (e.g., wheelchair or bedside chair)?: Total Help needed to walk in hospital room?: Total Help needed climbing 3-5 steps with a railing? : Total 6 Click Score: 6    End of Session Equipment Utilized During Treatment: Gait belt;Cervical collar Activity Tolerance: Patient limited by fatigue Patient left: in chair;with call bell/phone within reach;with chair alarm set;with family/visitor present Nurse  Communication:  Mobility status PT Visit Diagnosis: Unsteadiness on feet (R26.81);Muscle weakness (generalized) (M62.81);Difficulty in walking, not elsewhere classified (R26.2)    Time: 7998-7215 PT Time Calculation (min) (ACUTE ONLY): 31 min   Charges:   PT Evaluation $PT Eval Moderate Complexity: 1 Mod          Zevin Nevares A. Dan Humphreys PT, DPT Acute Rehabilitation Services Pager [phone] Office [phone]   Viviann Spare 02/15/2021, 10:52 AM

## 2021-02-15 NOTE — Consult Note (Signed)
NAME:  David Craig, MRN:  [id number], DOB:  January 23, 1986, LOS: 1 ADMISSION DATE:  02/14/2021, CONSULTATION DATE:  9/15 REFERRING MD:  Dr. Iver Nestle, CHIEF COMPLAINT: Stroke  History of Present Illness:  Patient is encephalopathic and/or intubated. Therefore history has been obtained from chart review.   35 year old male with no significant past medical history presented to [facility] emergency department on 9/14 after fall at home with left-sided weakness.  Last known well at 10 AM on 9/14 at which time he suffered a mechanical fall in the shower and struck his head.  He described brief loss of consciousness and proceeded to take arrest.  When family checked on him later in the day he was noted to have weakness in the left arm and leg.  Family called EMS who transported the patient to [facility] emergency department as a code stroke after also noticing left facial droop and slurred speech. Imaging concerning for dissection of the distarl right ICA, which becomes occluded proximal to the carotid terminus with a large areas of ischemic penumbra. He was taken emergently to IR, however, he was not able to be successfully revascularized. Post op he was taken to the ICU on the ventilator.   Of note, per IR note the patient did describe occasional cocaine use, as well as THC and smoking/vaping.   Pertinent  Medical History   has no past medical history on file.   Significant Hospital Events: Including procedures, antibiotic start and stop dates in addition to other pertinent events   9/14 admit with CVA> IR with TICI 0  Interim History / Subjective:    Objective   Blood pressure (!) 141/90, pulse 86, temperature 97.9 F (36.6 C), temperature source Oral, resp. rate 12, height 5\' 8"  (1.727 m), weight 104.3 kg, SpO2 100 %.    Vent Mode: PRVC FiO2 (%):  [100 %] 100 % Set Rate:  [12 bmp] 12 bmp Vt Set:  [550 mL] 550 mL PEEP:  [5 cmH20] 5 cmH20 Plateau Pressure:  [20 cmH20] 20 cmH20   Intake/Output  Summary (Last 24 hours) at 02/15/2021 0321 Last data filed at 02/15/2021 0109 Gross per 24 hour  Intake 1000 ml  Output 100 ml  Net 900 ml   Filed Weights   02/14/21 2145  Weight: 104.3 kg    Examination: General: young adult male in NAD on vent HENT: [place]/AT, PERRL, no JVD Lungs: Clear bilateral breath sounds.  Cardiovascular: RRR, no MRG Abdomen: Soft, non-tender, non-distended Extremities: No acute deformity. No edema.  Neuro: Awake. Follows commands on R. No movement to command on L.   MRI brain 9/15 > multifocal acute ischemia in the right PCA territory. Could affect the optic radiation. Occlusion of the distal right ICA. Favor thrombus over dissection of the ICA. Right PCA P1 seg occlusion.   Resolved Hospital Problem list     Assessment & Plan:   ICA occlusion on the R. ? Dissection. Appeared to be dissection on CTA, but more like thormbus on MRI. TICI 0 result in IR. He did have collateral flow to R MCA territory on angiogram.  - Neurology primary - SBP goal 120-180. May need pressors to meet minimum due to sedation needs.  - Cervical collar in place - Neuro checks - Heparin infusion per neuro  Acute hypoxemic respiratory failure 2/2 CVA - Full vent support - Can likely wean to extubate in the AM. Agitation too out of control at this time to decrease sedation considering need to lay flat for  the next several hours.  - VAP bundle - prop/fent for RASS -1 to -2  ? Substance abuse - Check urine tox  ALT elevation: reason unclear. Etoh negative - repeat chemistry in AM   Best Practice (right click and "Reselect all SmartList Selections" daily)   Diet/type: NPO DVT prophylaxis: systemic heparin GI prophylaxis: PPI Lines: N/A Foley:  N/A Code Status:  full code Last date of multidisciplinary goals of care discussion []   Labs   CBC: Recent Labs  Lab 02/14/21 2150 02/14/21 2209  WBC 13.9*  --   NEUTROABS 11.9*  --   HGB 17.9* 17.3*  HCT 50.4 51.0  MCV  87.5  --   PLT 176  --     Basic Metabolic Panel: Recent Labs  Lab 02/14/21 2150 02/14/21 2209  NA 139 140  K 4.0 4.2  CL 101 103  CO2 28  --   GLUCOSE 131* 127*  BUN 13 16  CREATININE 0.70 0.70  CALCIUM 9.7  --    GFR: Estimated Creatinine Clearance: 148.1 mL/min (by C-G formula based on SCr of 0.7 mg/dL). Recent Labs  Lab 02/14/21 2150  WBC 13.9*    Liver Function Tests: Recent Labs  Lab 02/14/21 2150  AST 31  ALT 58*  ALKPHOS 64  BILITOT 1.3*  PROT 7.6  ALBUMIN 4.4   No results for input(s): LIPASE, AMYLASE in the last 168 hours. No results for input(s): AMMONIA in the last 168 hours.  ABG    Component Value Date/Time   TCO2 26 02/14/2021 2209     Coagulation Profile: Recent Labs  Lab 02/14/21 2150  INR 1.0    Cardiac Enzymes: No results for input(s): CKTOTAL, CKMB, CKMBINDEX, TROPONINI in the last 168 hours.  HbA1C: No results found for: HGBA1C  CBG: No results for input(s): GLUCAP in the last 168 hours.  Review of Systems:   Patient is encephalopathic and/or intubated. Therefore history has been obtained from chart review.    Past Medical History:  He,  has no past medical history on file.   Surgical History:  No past surgical history on file.   Social History:   reports that he quit smoking about 5 years ago. He has never used smokeless tobacco. He reports current alcohol use. He reports that he does not use drugs.   Family History:  His family history is not on file.   Allergies No Known Allergies   Home Medications  Prior to Admission medications   Medication Sig Start Date End Date Taking? Authorizing Provider  ibuprofen (ADVIL,MOTRIN) 800 MG tablet Take 1 tablet (800 mg total) by mouth 3 (three) times daily. 03/14/16   05/14/16, PA-C     Critical care time: 36 min     Elson Areas, AGACNP-BC [facility]  See Amion for personal pager PCCM on call pager [phone] until 7pm. Please  call Elink 7p-7a. [phone]  02/15/2021 3:42 AM

## 2021-02-15 NOTE — Progress Notes (Signed)
OT Cancellation Note  Patient Details Name: David Craig MRN: [id number] DOB: [birth year]   Cancelled Treatment:    Reason Eval/Treat Not Completed: Medical issues which prohibited therapy. Pt on strict bedrest and note from yesterday states may try to extubate today. Will await increased activity orders.  Ignacia Palma, OTR/L Acute Rehab Services Pager [phone] Office [phone]    Evette Georges 02/15/2021, 8:01 AM

## 2021-02-15 NOTE — H&P (Addendum)
Neurology H&P  CC: Left sided weakness  History is obtained from: Patient, wife and chart review   HPI: David Craig is a 35 y.o. male with a past medical history significant for polysubstance abuse (cocaine, tobacco, vaping, oxycodone), migraine headaches, obesity (BMI 34.97).   He was having his typical migraine today, but this was not unusual for him and otherwise he had been in his normal state of health -- he had called out of work due to his headache. Last reported to cocaine use 1 week ago but fairly regular marijuana use at least several times a week.  He reported slipping in the shower due to generally slippery conditions, and initially reported to the ED provider that he had gotten back to his bed normally, but then later reported to me that he had significant difficulty getting back to bed due to left-sided weakness.  ED provider activated a code stroke on arrival appropriately given the left-sided deficits and right gaze preference.  He was initially activated as a code IR for his high NIH stroke scale and favorable ASPECTS score and CT perfusion profile.  While his initial CTA was felt to be most consistent with a dissection his diagnostic cerebral angiogram did not clearly show a dissection, though there was interruption of flow in the right carotid artery after the takeoff of the ophthalmic artery through to the ICA terminus, with good flow across the circle of Willis to the right MCA.  One aspiration attempt was made without successful retrieval of the clot.  Given lack of clarity on the safety of further intervention (aggressive attempts at aspiration in the setting of a dissection would be much more risky than for a simple embolic clot), decision was made to attempt MRI/MRA to clarify dissection versus thrombus.  While not definitive, this imaging does favor likely thrombus.  Imaging also revealed that the patient has had a significant right PCA stroke which was felt to explain his  deficits on examination; MRI confirmed that there did not seem to be significant right MCA territory cortical ischemia based on DWI imaging.    He is not vaccinated for COVID-19 but has not had any symptoms of infection  LKW: 11 AM on 9/15 tPA given?: No, due to outside the window IA performed?:  Diagnostic angiogram performed with 1 attempt to retrieve the clot, please see discussion below Premorbid modified rankin scale:      0 - No symptoms.  ROS: All other review of systems was negative except as noted in the HPI, with confirmation from family as possible in the setting of acute stroke  No past medical history on file. See pertinent history in HPI above  No family history on file. Not obtained in the acute setting  Social History:  reports that he quit smoking about 5 years ago. He has never used smokeless tobacco. He reports current alcohol use. He reports that he does not use drugs.  He endorses marijuana use as well as cocaine use.  Wife additionally reports some oxycodone use and potentially other substances.  They report that alcohol use is only once every 2 months or so and he drinks 2-3 beers at a time  Exam: Current vital signs: BP (!) 141/90 (BP Location: Right Arm)   Pulse 86   Temp 97.9 F (36.6 C) (Oral)   Resp 12   Ht 5' 8"  (1.727 m)   Wt 104.3 kg   SpO2 100%   BMI 34.97 kg/m  Vital signs in last 24  hours: Temp:  [97.9 F (36.6 C)] 97.9 F (36.6 C) (09/14 2148) Pulse Rate:  [77-88] 86 (09/15 0101) Resp:  [12-25] 12 (09/15 0101) BP: (130-154)/(77-92) 141/90 (09/15 0101) SpO2:  [97 %-100 %] 100 % (09/15 0242) FiO2 (%):  [100 %] 100 % (09/15 0105) Weight:  [104.3 kg] 104.3 kg (09/14 2145)   Physical Exam  Constitutional: Appears well-developed and well-nourished.  Psych: Affect appropriate to situation, mildly anxious about his symptoms Eyes: No scleral injection HENT: No oropharyngeal obstruction.  MSK: no joint deformities.  Cardiovascular: Normal  rate and regular rhythm.  Respiratory: Effort normal, non-labored breathing GI: Soft.  No distension. There is no tenderness.  Skin: Warm dry and intact visible skin  Neuro: Mental Status: Patient is awake, alert, oriented to person, place, month, year, and situation. Patient is able to give some history but is occasionally inconsistent in details. No signs of aphasia or neglect Cranial Nerves: II: Visual Fields are notable for a right hemianopia. Pupils are equal, round, and reactive to light.   III,IV, VI: EOMI with a clear right gaze preference although he can bury fully to the left with a significant encouragement V: Facial sensation is symmetric to light touch VII: Facial movement is notable for a moderate left facial droop VIII: hearing is intact to voice X: Uvula elevates symmetrically XI: Shoulder shrug is symmetric. XII: tongue is midline without atrophy or fasciculations.  Motor: Tone is normal. Bulk is normal.  Moving the right side freely antigravity and with good gross strength.  The left upper extremity has some intermittent extensor posturing and no clear movement to command.  The left lower extremity could be briefly lifted up to gravity but falls quickly to the bed Sensory: Sensation is symmetric to light touch and temperature in the arms and legs.  There is no extinction to double simultaneous stimuli Cerebellar: FNF and HKS are intact on the right  NIHSS total 11 performed immediately on code stroke activation Score breakdown: One-point for gaze preference to the right, 2 points for left hemianopia, 2 points for left facial droop, 3 points for left upper extremity weakness, 2 points for left lower extremity weakness, one-point for dysarthria  Post procedure per nursing report, he was strong and purposeful with the right upper and lower extremity, minimal movement of the left side, and was able to follow some commands but required significant sedation to manage  agitation   I have reviewed labs in epic and the results pertinent to this consultation are: CMP notable for a mildly elevated glucose at 131, mildly elevated ALT at 58 with a normal AST at 31, mildly elevated T bili at 1.3 CBC is notable for mildly elevated white count at 13.9 with elevated hemoglobin of 17.9 Normal PT/PTT/INR COVID-negative Blood alcohol level undetectable  I have reviewed the images obtained:  Head CT without acute intracranial process, aspects 10  CTA personally reviewed and discussed extensively with Dr. Earleen Newport (neuro interventional radiology) and Dr. Collins Scotland (neuroradiology) 1. 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 The right middle and anterior cerebral arteries fill via flow across the anterior communicating artery. 2. Large area of ischemic penumbra within the right hemisphere in a watershed/hypoperfusion type distribution. No core infarct. CTA with concern for right carotid dissection with occlusion of the right carotid terminus and right P-comm irregularity On further review, there is an area of occlusion of the proximal right posterior communicating  artery and the proximal P2 segment of the right posterior cerebral artery. This is likely an extension of the process affecting the distal right internal carotid artery. CTP with watershed area at risk, 120 mL, personally reviewed  MRI brain personally reviewed and discussed extensively with Dr. Earleen Newport (neuro interventional radiology) and Dr. Collins Scotland (neuroradiology) 1. Multifocal acute ischemia within the right PCA territory, involving the right occipital lobe, medial right temporal lobe and the ventrolateral thalamus/posterior limb of the internal capsule. The thalamocapsular infarct could affect the optic radiation. 2. Occlusion of the distal right internal carotid artery, as previously  demonstrated by CTA. 3. Findings of this study favor thrombus within the distal right ICA. No clear evidence of dissection. 4. Right PCA P1 segment occlusion. 5. The right MCA fills via flow across the anterior pathway of the circle-of-Willis.  Impression: This is a challenging case in a young previously healthy patient with polysubstance abuse.  Ultimately after extensive imaging and discussion it is unclear if the underlying pathology of the right carotid occlusion is a dissection with thrombus formation versus an embolic event.  Ultimately the risk of further intervention was felt to potentially outweigh the benefit, given no PCA LVO on angiogram, and good collateral filling of the right MCA on angiogram as well as CTA.  Discussed with family decision to start heparin drip to reduce risk of clot propagation, with close neurological monitoring.   Etiology of his event is felt to be most likely substance related vasculopathy, though cannot rule out a cardioembolic event (again potentially in the setting of substance abuse related cardiac pathology given no other known cardiac history).  Will additionally perform a hypercoagulability work-up for completeness.   Recommendations:  # Acute right PCA stroke with additional right carotid terminus thrombus without MCA territory ischemia - Stroke labs TSH, ESR, RPR, HgbA1c, fasting lipid panel, UA, UDS - Hypercoagulability work-up: In the acute phase while on heparin drip will test beta-2 glycoprotein antibodies, homocystine, prothrombin gene mutation, factor V Leiden, cardiolipin antibodies, and lupus anticoagulant, as well as Antithrombin III mutation - Frequent neuro checks, every hour at this time - Echocardiogram - Hold antiplatelet agents in the setting of therapeutic anticoagulation with a low level, no bolus heparin drip with pharmacy management - Risk factor modification, substance use cessation counseling when clinically stabilized - Telemetry  monitoring; 30 day event monitor on discharge if no arrythmias captured   - For 24 hours in the setting of acute stroke on heparin drip < 180/105, SBP > 120, MAP > 75 - PT consult, OT consult, Speech consult when clinically stabilized - Admitted to stroke team   # Intubated for procedure -Left intubated given he is post procedure and may be significantly agitated given his substance use history putting him at risk of postprocedural complications such as hematoma formation especially in the setting of starting heparin drip -Appreciate CCM vent / sedation management overnight, will hope to extubate early tomorrow morning  Lesleigh Noe MD-PhD [facility] [phone] Available 7 PM to 7 AM, outside of these hours please call Neurologist on call as listed on Amion.  Total critical care time: 150 minutes   Critical care time was exclusive of separately billable procedures and treating other patients.   Critical care was necessary to treat or prevent imminent or life-threatening deterioration.   Critical care was time spent personally by me on the following activities: development of treatment plan with patient and/or surrogate as well as nursing, discussions with consultants/primary team, evaluation of patient's response  to treatment, examination of patient, obtaining history from patient or surrogate, ordering and performing treatments and interventions, ordering and review of laboratory studies, ordering and review of radiographic studies, and re-evaluation of patient's condition as needed, as documented above.

## 2021-02-15 NOTE — Evaluation (Signed)
Occupational Therapy Evaluation Patient Details Name: David Craig MRN: [id number] DOB: 1985/07/12 Today's Date: 02/15/2021   History of Present Illness David Craig is a 35 y.o. male admitted s/p fall in shower with left side weakness. MRI; acute right PCA stroke with additional right carotid terminus thrombus without MCA territory ischemia. PHMx:polysubstance abuse (cocaine, tobacco, vaping, oxycodone), migraine headaches.   Clinical Impression   This 35 yo male admitted with above presents to acute OT with PLOF of being totally independent with all aspects of life including working and raised two kids (1 and 67 yo) with his wife. Currently pt is Mod -total A for all basic ADLs and min A+2-Mod A +2 for all mobility with ability to help stand but cannot take steps. He will continue to benefit from acute OT with follow up OT on CIR.      Recommendations for follow up therapy are one component of a multi-disciplinary discharge planning process, led by the attending physician.  Recommendations may be updated based on patient status, additional functional criteria and insurance authorization.   Follow Up Recommendations  CIR;Supervision/Assistance - 24 hour    Equipment Recommendations  Other (comment) (TBD next venue)       Precautions / Restrictions Precautions Precautions: Fall Precaution Comments: L hemi Required Braces or Orthoses: Cervical Brace Cervical Brace: Hard collar (discontinued by MD at end of therapy eval) Restrictions Weight Bearing Restrictions: No      Mobility Bed Mobility Overal bed mobility: Needs Assistance Bed Mobility: Supine to Sit     Supine to sit: Mod assist;+2 for physical assistance;+2 for safety/equipment     General bed mobility comments: Patient able to advance R LE off bed and assist with trunk elevation. Requires assistance for bringing L LE off bed and trunk elevation    Transfers Overall transfer level: Needs assistance Equipment used: 2  person hand held assist Transfers: Sit to/from Stand;Lateral/Scoot Transfers Sit to Stand: Mod assist;+2 physical assistance;+2 safety/equipment        Lateral/Scoot Transfers: Min assist;+2 physical assistance;+2 safety/equipment General transfer comment: modA+2 to stand from EOB with HHAx2 with L knee blocked to prevent buckling. MinA+2 for lateral scoot towards R to recliner. Cues required for placement of R hand    Balance Overall balance assessment: Needs assistance Sitting-balance support: Single extremity supported;Feet supported Sitting balance-Leahy Scale: Poor Sitting balance - Comments: requires up to modA to maintain sitting balance. Patient with L lateral lean Postural control: Left lateral lean Standing balance support: Bilateral upper extremity supported Standing balance-Leahy Scale: Poor Standing balance comment: requires +2 assist to maintain standing                           ADL either performed or assessed with clinical judgement   ADL Overall ADL's : Needs assistance/impaired Eating/Feeding: NPO   Grooming: Moderate assistance Grooming Details (indicate cue type and reason): supported sitting Upper Body Bathing: Moderate assistance Upper Body Bathing Details (indicate cue type and reason): supported sitting Lower Body Bathing: Total assistance Lower Body Bathing Details (indicate cue type and reason): Mod A+2 sit<>stand Upper Body Dressing : Maximal assistance Upper Body Dressing Details (indicate cue type and reason): supported sitting Lower Body Dressing: Total assistance Lower Body Dressing Details (indicate cue type and reason): supported sitting Toilet Transfer: Minimal assistance;+2 for physical assistance Toilet Transfer Details (indicate cue type and reason): lateral scoot, simulated bed do drop arm recliner going to pt's right Toileting- Clothing Manipulation and Hygiene: Total assistance  Toileting - Clothing Manipulation Details  (indicate cue type and reason): Mod A +2 sit<>stand             Vision   Additional Comments: Need to assess, pt did not complain of any issues, but did tend to keep eyes closed            Pertinent Vitals/Pain Pain Assessment: Faces Faces Pain Scale: No hurt Pain Intervention(s): Monitored during session     Hand Dominance Right   Extremity/Trunk Assessment Upper Extremity Assessment Upper Extremity Assessment: Defer to OT evaluation   Lower Extremity Assessment Lower Extremity Assessment: LLE deficits/detail LLE Deficits / Details: 0/5 LLE Sensation: WNL LLE Coordination: decreased fine motor;decreased gross motor   Cervical / Trunk Assessment Cervical / Trunk Assessment: Normal   Communication Communication Communication: No difficulties   Cognition Arousal/Alertness: Lethargic Behavior During Therapy: Flat affect;Impulsive Overall Cognitive Status: Impaired/Different from baseline Area of Impairment: Attention;Memory;Following commands;Safety/judgement;Awareness;Problem solving                   Current Attention Level: Sustained Memory: Decreased short-term memory Following Commands: Follows one step commands with increased time Safety/Judgement: Decreased awareness of safety;Decreased awareness of deficits Awareness: Emergent Problem Solving: Slow processing;Requires verbal cues General Comments: impulsive prior to OOB mobility but responds well to cues to slow pace and wait for assistance due to L sided deficits. Decreased awareness of deficits and safety.   General Comments  Recently extubated prior to arrival and placed on 4L O2 [place] with spO2 94%. Removed O2 with patient able to maintain >90% throughout session. Donned 2L O2 at end of session for comfort    Exercises Other Exercises Other Exercises: Not having family do PROM on LUE as yet due to blocking block for A-line on that arm   Shoulder Instructions      Home Living Family/patient  expects to be discharged to:: Private residence Living Arrangements: Spouse/significant other;Children (1 and 3 yo) Available Help at Discharge: Family;Available 24 hours/day Type of Home: House Home Access: Stairs to enter [employer] of Steps: 4 Entrance Stairs-Rails: Left Home Layout: One level     Bathroom Shower/Tub: IT trainer: Standard     Home Equipment: Hand held shower head   Additional Comments: 2 children - 79 and 24 years old      Prior Functioning/Environment Level of Independence: Independent        Comments: working in [employer] for [employer]        OT Problem List: Decreased strength;Decreased range of motion;Impaired balance (sitting and/or standing);Decreased safety awareness;Decreased cognition;Decreased coordination;Impaired tone;Impaired UE functional use      OT Treatment/Interventions: Self-care/ADL training;Therapeutic exercise;Therapeutic activities;Neuromuscular education;DME and/or AE instruction;Patient/family education;Balance training    OT Goals(Current goals can be found in the care plan section) Acute Rehab OT Goals Patient Stated Goal: to be able to walk OT Goal Formulation: With patient Time For Goal Achievement: 03/01/21 Potential to Achieve Goals: Good  OT Frequency: Min 2X/week           Co-evaluation PT/OT/SLP Co-Evaluation/Treatment: Yes Reason for Co-Treatment: Necessary to address cognition/behavior during functional activity;For patient/therapist safety;To address functional/ADL transfers PT goals addressed during session: Mobility/safety with mobility;Balance;Strengthening/ROM OT goals addressed during session: Strengthening/ROM;ADL's and self-care      AM-PAC OT "6 Clicks" Daily Activity     Outcome Measure Help from another person eating meals?: A Little (setup once not NPO) Help from another person taking care of personal grooming?: A Lot Help from another  person toileting,  which includes using toliet, bedpan, or urinal?: A Lot Help from another person bathing (including washing, rinsing, drying)?: A Lot Help from another person to put on and taking off regular upper body clothing?: A Lot Help from another person to put on and taking off regular lower body clothing?: Total 6 Click Score: 12   End of Session Equipment Utilized During Treatment: Gait belt Nurse Communication: Mobility status;Need for lift equipment (lateral scoot to right or sara stedy)  Activity Tolerance: Patient tolerated treatment well Patient left: in chair;with call bell/phone within reach;with chair alarm set  OT Visit Diagnosis: Unsteadiness on feet (R26.81);Other abnormalities of gait and mobility (R26.89);Muscle weakness (generalized) (M62.81);Other symptoms and signs involving cognitive function;Hemiplegia and hemiparesis Hemiplegia - Right/Left: Left Hemiplegia - dominant/non-dominant: Dominant Hemiplegia - caused by: Cerebral infarction                Time: 0920-0944 OT Time Calculation (min): 24 min Charges:  OT General Charges $OT Visit: 1 Visit OT Evaluation $OT Eval Moderate Complexity: 1 Mod  Ignacia Palma, OTR/L Acute [employer] Pager [phone] Office [phone]    Evette Georges 02/15/2021, 10:57 AM

## 2021-02-15 NOTE — Procedures (Signed)
Extubation Procedure Note  Patient Details:   Name: David Craig DOB: 05-23-1986 MRN: [id number]   Airway Documentation:    Vent end date: 02/15/21 Vent end time: 0859   Evaluation  O2 sats: stable throughout Complications: No apparent complications Patient did tolerate procedure well. Bilateral Breath Sounds: Clear, Diminished   Pt extubated to 4L [place] per MD order. Pt did not have cuff leak prior to extubation. MD at bedside and aware of no cuff leak. No stridor noted. Pt able to voice his name.  Guss Bunde 02/15/2021, 9:00 AM

## 2021-02-15 NOTE — Procedures (Signed)
Neuro-Interventional Radiology  Post Cerebral Angiogram Procedure Note  Operator:    Dr. Loreta Ave Assistant:   None  History:   35 yo male presents with acute left sided weakness, secondary to right ICA occlusion at the skull base.   Baseline mRS:  0     Site of occlusion:  Supra-ophthalmic segment of the right ICA.   Procedure: US guided right CFA access 4-vessel Cervical & Cerebral Angiogram Single aspiration attempt at mechanical thrombectomy Deployment of Angioseal Flat Panel CT in NIR  First Pass Device:  Aspiration only with Zoom 55  Result   TICI 0  Findings:   The angiogram demonstrates supra-ophthalmic ICA occlusion, with complete filling of the right MCA branches secondary to left-right flow.   The question is whether the occlusion is secondary to spontaneous dissection, or thrombo-embolic disease.   Anesthesia:   GETA  EBL:    minimal     Complication:  None   Medication: IV tPA administered?: no IA Medication:  no  Recommendations: - pt to remain intubated - Goal SBP <185 - urgent MRI/MRA, for evaluation of dissection vs thrombus at site of occlusion, which might potentially change management - Frequent NV checks - NIR to follow - Neuro ICU, 4N, bed 25  Signed,  Abisai Deer S. Loreta Ave, DO

## 2021-02-15 NOTE — Progress Notes (Signed)
Referring Physician(s): Code Stroke Dr. Iver Nestle  Supervising Physician: Gilmer Mor  Patient Status:  [facility] - In-pt  Chief Complaint: Code Stroke R ICA occlusion  Subjective: Patient extubated.  Sitting up in chair.  Keeps eyes closed, but following commands and interactive with prompting.   Wiggling left toes/foot to command, no movement to L hand/forearm.  Able to state needs.   Allergies: Patient has no known allergies.  Medications: Prior to Admission medications   Medication Sig Start Date End Date Taking? Authorizing Provider  ibuprofen (ADVIL,MOTRIN) 800 MG tablet Take 1 tablet (800 mg total) by mouth 3 (three) times daily. 03/14/16   Elson Areas, PA-C     Vital Signs: BP 116/77   Pulse 63   Temp 98.6 F (37 C) (Axillary)   Resp 15   Ht 5\' 8"  (1.727 m)   Wt 230 lb (104.3 kg)   SpO2 97%   BMI 34.97 kg/m   Physical Exam NAD, alert Neuro: opens eyes to command.  L facial droop/weakness.  Pupils equal.  Moving RU and RLE spontaneously.  Only able to wiggle L toes, unable to bend knee or lift off the bed.  No movement noted with left hand/arm.  Groin: soft, intact.  Procedure site stable. No hematoma or pseudoaneurysm.   Imaging: MR ANGIO HEAD WO CONTRAST  Result Date: 02/15/2021 EXAM: MRI HEAD WITHOUT CONTRAST MRA HEAD WITHOUT CONTRAST TECHNIQUE: Multiplanar, multi-echo pulse sequences of the brain and surrounding structures were acquired without intravenous contrast. Angiographic images of the Circle of Willis were acquired using MRA technique without intravenous contrast. COMPARISON:  No pertinent prior exam. FINDINGS: MRI HEAD FINDINGS A tailored MRI protocol was used, consisting of susceptibility weighted images, diffusion-weighted images, fat-suppressed axial T1-weighted images, axial T2-weighted images and axial FLAIR sequence. There is multifocal acute ischemia within the right PCA territory, involving the right occipital lobe, medial right temporal  lobe and the ventrolateral thalamus. There is a punctate focus of acute ischemia in the right parietal white matter. The right internal carotid artery flow void is abnormal beginning at the level of the C2-3 disc space and continuing to the carotid terminus. Susceptibility weighted imaging shows magnetic susceptibility effect within the portion of the right internal carotid artery corresponding to the occluded area on the earlier CTA of the head and neck and also corresponding to the area of the abnormal T2-weighted imaging flow void. MRA HEAD FINDINGS POSTERIOR CIRCULATION: --Vertebral arteries: Normal --Inferior cerebellar arteries: Normal. --Basilar artery: Normal. --Superior cerebellar arteries: Normal. --Posterior cerebral arteries: There is partial occlusion of the right P1 segment. There is a small right posterior communicating artery that is also partially occluded. Left PCA is normal. There is reconstitution of the right PCA at the mid P2 segment. ANTERIOR CIRCULATION: --Intracranial internal carotid arteries: Diminished flow related enhancement of the distal right ICA loss of enhancement at approximately the level of the ophthalmic artery. Left ICA is normal. --Anterior cerebral arteries (ACA): Normal. Both A1 segments are present. The anterior communicating artery is patent. --Middle cerebral arteries (MCA): Normal. The right MCA fills via flow across the anterior pathway of the circle-of-Willis. Fat-suppressed T1-weighted imaging shows no evidence of intramural thrombus within the right ICA. IMPRESSION: 1. Multifocal acute ischemia within the right PCA territory, involving the right occipital lobe, medial right temporal lobe and the ventrolateral thalamus/posterior limb of the internal capsule. The thalamocapsular infarct could affect the optic radiation. 2. Occlusion of the distal right internal carotid artery, as previously demonstrated by CTA. 3.  Findings of this study favor thrombus within the distal  right ICA. No clear evidence of dissection. 4. Right PCA P1 segment occlusion. 5. The right MCA fills via flow across the anterior pathway of the circle-of-Willis. These results were called by telephone at the time of interpretation on 02/15/2021 at 2:35 am to Drs. SRISHTI BHAGAT and Gilmer Mor, who verbally acknowledged these results. Electronically Signed   By: Deatra Robinson M.D.   On: 02/15/2021 02:50   MR BRAIN WO CONTRAST  Result Date: 02/15/2021 : EXAM: MRI HEAD WITHOUT CONTRAST MRA HEAD WITHOUT CONTRAST TECHNIQUE: Multiplanar, multi-echo pulse sequences of the brain and surrounding structures were acquired without intravenous contrast. Angiographic images of the Circle of Willis were acquired using MRA technique without intravenous contrast. COMPARISON:   No pertinent prior exam. FINDINGS: MRI HEAD FINDINGS A tailored MRI protocol was used, consisting of susceptibility weighted images, diffusion-weighted images, fat-suppressed axial T1-weighted images, axial T2-weighted images and axial FLAIR sequence. There is multifocal acute ischemia within the right PCA territory, involving the right occipital lobe, medial right temporal lobe and the ventrolateral thalamus. There is a punctate focus of acute ischemia in the right parietal white matter. The right internal carotid artery flow void is abnormal beginning at the level of the C2-3 disc space and continuing to the carotid terminus. Susceptibility weighted imaging shows magnetic susceptibility effect within the portion of the right internal carotid artery corresponding to the occluded area on the earlier CTA of the head and neck and also corresponding to the area of the abnormal T2-weighted imaging flow void. MRA HEAD FINDINGS POSTERIOR CIRCULATION: --Vertebral arteries: Normal --Inferior cerebellar arteries: Normal. --Basilar artery: Normal. --Superior cerebellar arteries: Normal. --Posterior cerebral arteries: There is partial occlusion of the right P1  segment. There is a small right posterior communicating artery that is also partially occluded. Left PCA is normal. There is reconstitution of the right PCA at the mid P2 segment. ANTERIOR CIRCULATION: --Intracranial internal carotid arteries: Diminished flow related enhancement of the distal right ICA loss of enhancement at approximately the level of the ophthalmic artery. Left ICA is normal. --Anterior cerebral arteries (ACA): Normal. Both A1 segments are present. The anterior communicating artery is patent. --Middle cerebral arteries (MCA): Normal. The right MCA fills via flow across the anterior pathway of the circle-of-Willis. Fat-suppressed T1-weighted imaging shows no evidence of intramural thrombus within the right ICA. IMPRESSION: 1. Multifocal acute ischemia within the right PCA territory, involving the right occipital lobe, medial right temporal lobe and the ventrolateral thalamus/posterior limb of the internal capsule. The thalamocapsular infarct could affect the optic radiation. 2. Occlusion of the distal right internal carotid artery, as previously demonstrated by CTA. 3. Findings of this study favor thrombus within the distal right ICA. No clear evidence of dissection. 4. Right PCA P1 segment occlusion. 5. The right MCA fills via flow across the anterior pathway of the circle-of-Willis. These results were called by telephone at the time of interpretation on 02/15/2021 at 2: 35 am to Drs. SRISHTI BHAGAT and Gilmer Mor, who verbally acknowledged these results. Electronically Signed   By: Deatra Robinson M.D.   On: 02/15/2021 03:22   IR CT Head Ltd  Result Date: 02/15/2021 INDICATION: 35 year old male with a history of acute left-sided weakness, ICA occlusion, possible dissection EXAM: ULTRASOUND-GUIDED RIGHT COMMON FEMORAL ARTERY ACCESS FOUR-VESSEL CERVICAL/CEREBRAL ANGIOGRAM ATTEMPTED MECHANICAL THROMBECTOMY RIGHT INTRACRANIAL ICA ANGIO-SEAL FOR HEMOSTASIS COMPARISON:  CT same day MEDICATIONS: None  ANESTHESIA/SEDATION: The anesthesia team was present to provide general endotracheal tube anesthesia  and for patient monitoring during the procedure. Intubation was performed in biplane room 2. left radial arterial line was performed by the anesthesia team. Interventional neuro radiology nursing staff was also present. CONTRAST:  100 cc FLUOROSCOPY TIME:  Fluoroscopy Time: 13 minutes 30 seconds (2,922 mGy). COMPLICATIONS: None TECHNIQUE: Informed written consent was obtained from the patient's family after a thorough discussion of the procedural risks, benefits and alternatives. Specific risks discussed include: Bleeding, infection, contrast reaction, kidney injury/failure, need for further procedure/surgery, arterial injury or dissection, embolization to new territory, intracranial hemorrhage (10-15% risk), neurologic deterioration, cardiopulmonary collapse, death. All questions were addressed. Maximal Sterile Barrier Technique was utilized including during the procedure including caps, mask, sterile gowns, sterile gloves, sterile drape, hand hygiene and skin antiseptic. A timeout was performed prior to the initiation of the procedure. The anesthesia team was present to provide general endotracheal tube anesthesia and for patient monitoring during the procedure. Interventional neuro radiology nursing staff was also present. FINDINGS: Initial Findings: Ultrasound of the right inguinal region demonstrates patent right common femoral artery, without significant atherosclerotic plaque. No significant tortuosity of the branch vessels. Right vertebral artery: Normal course caliber and contour of the cervical segment of the right vertebral artery, with patent cervical branches. Anterior cerebral artery visualized with contribution from the lower cervical segment reticular medullary branches as well as the intracranial right vertebral artery. Mild tortuosity of the right V4 segment. No significant atherosclerotic changes  of the basilar artery. Slip stream artifact decreases the resolution of the basilar artery. Proximal right and left PCA are patent. There is decreased capillary/parenchymal perfusion on the lateral view within the cuneus. Right common carotid artery:  Normal course caliber and contour. Right external carotid artery: Patent with antegrade flow. Right internal carotid artery: Decreased caliber in the contrast column of the right cervical ICA, with slow filling throughout the length of the right cervical ICA to the skull base. The contrast column terminates into the ophthalmic artery, with occlusion just beyond the ophthalmic artery. There is no spiraling filling defect that would confirm a dissection flap. Right MCA: No filling of the right MCA branches, secondary to occlusion in the supra ophthalmic segment. Right ACA: No filling of the ACA branches, given the occlusion in the siphon. Left vertebral artery: Left PCA: Left common carotid artery:  Normal course caliber and contour. Left external carotid artery: Patent with antegrade flow. Left internal carotid artery: Normal course caliber and contour of the cervical portion. Vertical and petrous segment patent with normal course caliber contour. Cavernous segment patent. Clinoid segment patent. Antegrade flow of the ophthalmic artery. Ophthalmic segment patent. Terminus patent. Left MCA: M1 segment patent. Insular and opercular segments patent. Unremarkable caliber and course of the cortical segments. Typical arterial, capillary/ parenchymal, and venous phase. There is rapid left-to-right filling across a patent anterior communicating artery, with complete filling of the right anterior cerebral artery territory and the right MCA cerebral territory. There is slight delay throughout all phases of the early, late arterial phase, capillary phase, and parenchymal phase as well as the venous phase compared to the left secondary to the cross fill. The watershed of the ACA  and MCA territory is filling, though with delay that corresponds to preoperative CT perfusion imaging. No MCA branch occlusion identified. No significant retrograde filling of the ICA terminus. Left ACA: A 1 segment patent. A 2 segment perfuses the right territory. Completion Findings: Initial TICI of the right carotid siphon: 0 Final TICI of the right carotid siphon: 0 Flat  panel CT reveals no intracranial hemorrhage PROCEDURE: The anesthesia team was present to provide general endotracheal tube anesthesia and for patient monitoring during the procedure. Intubation was performed in neuro angio biplane room. Interventional neuro radiology nursing staff was also present. Ultrasound survey of the right inguinal region was performed with images stored and sent to PACs. 11 blade scalpel was used to make a small incision. Blunt dissection was performed with US guidance. A micropuncture needle was used access the right common femoral artery under ultrasound. With excellent arterial blood flow returned, an .018 micro wire was passed through the needle, observed to enter the abdominal aorta under fluoroscopy. The needle was removed, and a micropuncture sheath was placed over the wire. The inner dilator and wire were removed, and an 035 wire was advanced under fluoroscopy into the abdominal aorta. A standard 5 French sheath was placed, aspirated and flushed. JB 1 catheter was then advanced on the diagnostic wire, advanced to the proximal descending thoracic aorta. Wire was removed and a double flush was performed. A complete cerebral/cervical angiogram was then performed using standard technique with the assistance of a Glidewire, for selection of the right vertebral artery, right common carotid artery/right cervical ICA, left carotid artery, left vertebral artery. The JB 1 catheter was then withdrawn into the proximal descending thoracic aorta, flushed, with flow switch applied. Images were then reviewed with neurology  team. After review of the images we elected to proceed with attempted aspiration at the site of occlusion. JB 1 catheter was navigated on the Glidewire into the distal right cervical ICA. Glidewire was removed and a rose in wire was placed into the cervical ICA. JB 1 was removed. The 5 French sheath was removed and a 25cm 40F straight vascular sheath was placed. The dilator was removed and the sheath was flushed. Sheath was attached to pressurized and heparinized saline bag for constant forward flow. A 95cm 087 "Walrus" balloon guide with coaxial 125cm Berenstein diagnostic catheter was advanced over the Rose an wire to the distal cervical ICA. Double flush of the balloon guide was performed, and attached to rotating hemostatic valve and a pressure bag. Angiogram was performed. Road map function was used once the occluded vessel was identified. A coaxial system was then advanced through the balloon catheter, which included the selected intermediate catheter, microcatheter, and microwire. In this scenario, the set up included a zoom 71 aspiration catheter, a Trevo Trak microcatheter, and 014 synchro soft wire. This system was advanced through the balloon guide catheter under the road-map function, with adequate back-flush at the rotating hemostatic valve at that back end of the balloon guide. Coaxial system was gently advanced through the cavernous segment, to the level of the ophthalmic artery origin. The microwire and microcatheter were removed when the tip of the aspiration catheter was just proximal to the site of occlusion. Constant aspiration was then initiated using the proprietary engine at the hub of the zoom aspiration catheter. We confirmed continuous return of blood, and then advanced the aspiration catheter gently into the occlusion site beyond the ophthalmic artery origin. The intention was to gently advanced the catheter, and avoid advancing the catheter into the ICA terminus/proximal MCA, given the  uncertainty of the source of occlusion (embolus versus dissection). Once we confirmed cessation of return of blood flow as we advanced the catheter, we observed a 30 second time interval with forward pressure on the aspiration catheter, and then withdrew the aspiration catheter through the cavernous segment and into the balloon guide.  Free aspiration of blood was observed at the hub of the aspiration catheter. Repeat angiogram was performed demonstrating that the occlusion persisted. 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 Catheter and balloon guide were removed. The skin at the puncture site was then cleaned with Chlorhexidine. The 8 French sheath was removed and an 75F angioseal was deployed. Flat panel CT was performed. Patient tolerated the procedure well and remained hemodynamically stable throughout. No complications were encountered and no significant blood loss encountered. 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 Angio-Seal deployed for hemostasis. Cervical angiogram reveals decreased caliber of the right cervical ICA, without definitive dissection flap. Cerebral angiogram confirms adequate left to right perfusion of the right-sided MCA territory branches with no MCA branch occlusion. No proximal occlusion identified within the right PCA Signed, Yvone Neu. Reyne Dumas, RPVI [facility] Radiology PLAN: The patient will remain intubated. ICU status Urgent MRI/MRA, as diagnostic tool for right  carotid siphon occlusion, to determine extent of infarction, as well as if there is obvious dissection flap versus thromboembolic disease as the etiology of occlusion. Target systolic blood pressure of less than 185 systolic Right hip straight time 4 hours Frequent neurovascular checks Electronically Signed   By: Gilmer Mor D.O.   On: 02/15/2021 10:01   IR US Guide Vasc Access Right  Result Date: 02/15/2021 INDICATION: 35 year old male with a history of acute left-sided weakness, ICA occlusion, possible dissection EXAM: ULTRASOUND-GUIDED RIGHT COMMON FEMORAL ARTERY ACCESS FOUR-VESSEL CERVICAL/CEREBRAL ANGIOGRAM ATTEMPTED MECHANICAL THROMBECTOMY RIGHT INTRACRANIAL ICA ANGIO-SEAL FOR HEMOSTASIS COMPARISON:  CT same day MEDICATIONS: None ANESTHESIA/SEDATION: The anesthesia team was present to provide general endotracheal tube anesthesia and for patient monitoring during the procedure. Intubation was performed in biplane room 2. left radial arterial line was performed by the anesthesia team. Interventional neuro radiology nursing staff was also present. CONTRAST:  100 cc FLUOROSCOPY TIME:  Fluoroscopy Time: 13 minutes 30 seconds (2,922 mGy). COMPLICATIONS: None TECHNIQUE: Informed written consent was obtained from the patient's family after a thorough discussion of the procedural risks, benefits and alternatives. Specific risks discussed include: Bleeding, infection, contrast reaction, kidney injury/failure, need for further procedure/surgery, arterial injury or dissection, embolization to new territory, intracranial hemorrhage (10-15% risk), neurologic deterioration, cardiopulmonary collapse, death. All questions were addressed. Maximal Sterile Barrier Technique was utilized including during the procedure including caps, mask, sterile gowns, sterile gloves, sterile drape, hand hygiene and skin antiseptic. A timeout was performed prior to the initiation of the procedure. The anesthesia team was present to provide  general endotracheal tube anesthesia and for patient monitoring during the procedure. Interventional neuro radiology nursing staff was also present. FINDINGS: Initial Findings: Ultrasound of the right inguinal region demonstrates patent right common femoral artery, without significant atherosclerotic plaque. No significant tortuosity of the branch vessels. Right vertebral artery: Normal course caliber and contour of the cervical segment of the right vertebral artery, with patent cervical branches. Anterior cerebral artery visualized with contribution from the lower cervical segment reticular medullary branches as well as the intracranial right vertebral artery. Mild tortuosity of the right V4 segment. No significant atherosclerotic changes of the basilar artery.  Slip stream artifact decreases the resolution of the basilar artery. Proximal right and left PCA are patent. There is decreased capillary/parenchymal perfusion on the lateral view within the cuneus. Right common carotid artery:  Normal course caliber and contour. Right external carotid artery: Patent with antegrade flow. Right internal carotid artery: Decreased caliber in the contrast column of the right cervical ICA, with slow filling throughout the length of the right cervical ICA to the skull base. The contrast column terminates into the ophthalmic artery, with occlusion just beyond the ophthalmic artery. There is no spiraling filling defect that would confirm a dissection flap. Right MCA: No filling of the right MCA branches, secondary to occlusion in the supra ophthalmic segment. Right ACA: No filling of the ACA branches, given the occlusion in the siphon. Left vertebral artery: Left PCA: Left common carotid artery:  Normal course caliber and contour. Left external carotid artery: Patent with antegrade flow. Left internal carotid artery: Normal course caliber and contour of the cervical portion. Vertical and petrous segment patent with normal course  caliber contour. Cavernous segment patent. Clinoid segment patent. Antegrade flow of the ophthalmic artery. Ophthalmic segment patent. Terminus patent. Left MCA: M1 segment patent. Insular and opercular segments patent. Unremarkable caliber and course of the cortical segments. Typical arterial, capillary/ parenchymal, and venous phase. There is rapid left-to-right filling across a patent anterior communicating artery, with complete filling of the right anterior cerebral artery territory and the right MCA cerebral territory. There is slight delay throughout all phases of the early, late arterial phase, capillary phase, and parenchymal phase as well as the venous phase compared to the left secondary to the cross fill. The watershed of the ACA and MCA territory is filling, though with delay that corresponds to preoperative CT perfusion imaging. No MCA branch occlusion identified. No significant retrograde filling of the ICA terminus. Left ACA: A 1 segment patent. A 2 segment perfuses the right territory. Completion Findings: Initial TICI of the right carotid siphon: 0 Final TICI of the right carotid siphon: 0 Flat panel CT reveals no intracranial hemorrhage PROCEDURE: The anesthesia team was present to provide general endotracheal tube anesthesia and for patient monitoring during the procedure. Intubation was performed in neuro angio biplane room. Interventional neuro radiology nursing staff was also present. Ultrasound survey of the right inguinal region was performed with images stored and sent to PACs. 11 blade scalpel was used to make a small incision. Blunt dissection was performed with US guidance. A micropuncture needle was used access the right common femoral artery under ultrasound. With excellent arterial blood flow returned, an .018 micro wire was passed through the needle, observed to enter the abdominal aorta under fluoroscopy. The needle was removed, and a micropuncture sheath was placed over the wire.  The inner dilator and wire were removed, and an 035 wire was advanced under fluoroscopy into the abdominal aorta. A standard 5 French sheath was placed, aspirated and flushed. JB 1 catheter was then advanced on the diagnostic wire, advanced to the proximal descending thoracic aorta. Wire was removed and a double flush was performed. A complete cerebral/cervical angiogram was then performed using standard technique with the assistance of a Glidewire, for selection of the right vertebral artery, right common carotid artery/right cervical ICA, left carotid artery, left vertebral artery. The JB 1 catheter was then withdrawn into the proximal descending thoracic aorta, flushed, with flow switch applied. Images were then reviewed with neurology team. After review of the images we elected to proceed with attempted aspiration  at the site of occlusion. JB 1 catheter was navigated on the Glidewire into the distal right cervical ICA. Glidewire was removed and a rose in wire was placed into the cervical ICA. JB 1 was removed. The 5 French sheath was removed and a 25cm 82F straight vascular sheath was placed. The dilator was removed and the sheath was flushed. Sheath was attached to pressurized and heparinized saline bag for constant forward flow. A 95cm 087 "Walrus" balloon guide with coaxial 125cm Berenstein diagnostic catheter was advanced over the Rose an wire to the distal cervical ICA. Double flush of the balloon guide was performed, and attached to rotating hemostatic valve and a pressure bag. Angiogram was performed. Road map function was used once the occluded vessel was identified. A coaxial system was then advanced through the balloon catheter, which included the selected intermediate catheter, microcatheter, and microwire. In this scenario, the set up included a zoom 71 aspiration catheter, a Trevo Trak microcatheter, and 014 synchro soft wire. This system was advanced through the balloon guide catheter under the  road-map function, with adequate back-flush at the rotating hemostatic valve at that back end of the balloon guide. Coaxial system was gently advanced through the cavernous segment, to the level of the ophthalmic artery origin. The microwire and microcatheter were removed when the tip of the aspiration catheter was just proximal to the site of occlusion. Constant aspiration was then initiated using the proprietary engine at the hub of the zoom aspiration catheter. We confirmed continuous return of blood, and then advanced the aspiration catheter gently into the occlusion site beyond the ophthalmic artery origin. The intention was to gently advanced the catheter, and avoid advancing the catheter into the ICA terminus/proximal MCA, given the uncertainty of the source of occlusion (embolus versus dissection). Once we confirmed cessation of return of blood flow as we advanced the catheter, we observed a 30 second time interval with forward pressure on the aspiration catheter, and then withdrew the aspiration catheter through the cavernous segment and into the balloon guide. Free aspiration of blood was observed at the hub of the aspiration catheter. Repeat angiogram was performed demonstrating that the occlusion persisted. 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 Catheter and balloon guide were removed. The skin at the puncture site was then cleaned with Chlorhexidine. The 8 French sheath was removed and an 82F angioseal was deployed. Flat panel CT was performed. Patient tolerated the procedure well and remained hemodynamically stable throughout. No complications were encountered and no significant blood loss encountered. 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 Angio-Seal deployed for hemostasis. Cervical angiogram reveals decreased caliber of the right cervical ICA, without definitive dissection flap. Cerebral angiogram confirms adequate left to right perfusion of the right-sided MCA territory branches with no MCA branch occlusion. No proximal occlusion identified within the right PCA Signed, Yvone Neu. Reyne Dumas, RPVI [facility] Radiology PLAN: The patient will remain intubated. ICU status Urgent MRI/MRA, as diagnostic tool for right carotid siphon occlusion, to determine extent of infarction, as well as if there is obvious dissection flap versus thromboembolic disease as the etiology  of occlusion. Target systolic blood pressure of less than 185 systolic Right hip straight time 4 hours Frequent neurovascular checks Electronically Signed   By: Gilmer Mor D.O.   On: 02/15/2021 10:01   CT C-SPINE NO CHARGE  Result Date: 02/14/2021 CLINICAL DATA:  Fall EXAM: CT CERVICAL SPINE WITHOUT CONTRAST TECHNIQUE: Multidetector CT imaging of the cervical spine was performed without intravenous contrast. Multiplanar CT image reconstructions were also generated. COMPARISON:  None. FINDINGS: Alignment: No static subluxation. Facets are aligned. Occipital condyles and the lateral masses of C1 and C2 are normally approximated. Skull base and vertebrae: No acute fracture. Soft tissues and spinal canal: No prevertebral fluid or swelling. No visible canal hematoma. Disc levels: No advanced spinal canal or neural foraminal stenosis. Upper chest: No pneumothorax, pulmonary nodule or pleural effusion. Other: Normal visualized paraspinal cervical soft tissues. IMPRESSION: No acute fracture or static subluxation of the  cervical spine. Electronically Signed   By: Deatra Robinson M.D.   On: 02/14/2021 22:50   Portable Chest x-ray  Result Date: 02/15/2021 CLINICAL DATA:  Intubation EXAM: PORTABLE CHEST 1 VIEW COMPARISON:  None. FINDINGS: Endotracheal tube with tip at the clavicular heads. Low volume chest with hazy and streaky density suggesting atelectasis. Accentuated heart size by technique. No effusion or pneumothorax. IMPRESSION: 1. Low volume chest with atelectasis. 2. Unremarkable endotracheal tube positioning. Electronically Signed   By: Tiburcio Pea M.D.   On: 02/15/2021 04:04   IR PERCUTANEOUS ART THROMBECTOMY/INFUSION INTRACRANIAL INC DIAG ANGIO  Result Date: 02/15/2021 INDICATION: 35 year old male with a history of acute left-sided weakness, ICA occlusion, possible dissection EXAM: ULTRASOUND-GUIDED RIGHT COMMON FEMORAL ARTERY ACCESS FOUR-VESSEL CERVICAL/CEREBRAL ANGIOGRAM ATTEMPTED MECHANICAL THROMBECTOMY RIGHT INTRACRANIAL ICA ANGIO-SEAL FOR HEMOSTASIS COMPARISON:  CT same day MEDICATIONS: None ANESTHESIA/SEDATION: The anesthesia team was present to provide general endotracheal tube anesthesia and for patient monitoring during the procedure. Intubation was performed in biplane room 2. left radial arterial line was performed by the anesthesia team. Interventional neuro radiology nursing staff was also present. CONTRAST:  100 cc FLUOROSCOPY TIME:  Fluoroscopy Time: 13 minutes 30 seconds (2,922 mGy). COMPLICATIONS: None TECHNIQUE: Informed written consent was obtained from the patient's family after a thorough discussion of the procedural risks, benefits and alternatives. Specific risks discussed include: Bleeding, infection, contrast reaction, kidney injury/failure, need for further procedure/surgery, arterial injury or dissection, embolization to new territory, intracranial hemorrhage (10-15% risk), neurologic deterioration, cardiopulmonary collapse, death. All questions were addressed. Maximal Sterile Barrier  Technique was utilized including during the procedure including caps, mask, sterile gowns, sterile gloves, sterile drape, hand hygiene and skin antiseptic. A timeout was performed prior to the initiation of the procedure. The anesthesia team was present to provide general endotracheal tube anesthesia and for patient monitoring during the procedure. Interventional neuro radiology nursing staff was also present. FINDINGS: Initial Findings: Ultrasound of the right inguinal region demonstrates patent right common femoral artery, without significant atherosclerotic plaque. No significant tortuosity of the branch vessels. Right vertebral artery: Normal course caliber and contour of the cervical segment of the right vertebral artery, with patent cervical branches. Anterior cerebral artery visualized with contribution from the lower cervical segment reticular medullary branches as well as the intracranial right vertebral artery. Mild tortuosity of the right V4 segment. No significant atherosclerotic changes of the basilar artery. Slip stream artifact decreases the resolution of the basilar artery. Proximal right and left PCA are patent. There is decreased capillary/parenchymal perfusion on the lateral view within the cuneus. Right common carotid artery:  Normal course caliber  and contour. Right external carotid artery: Patent with antegrade flow. Right internal carotid artery: Decreased caliber in the contrast column of the right cervical ICA, with slow filling throughout the length of the right cervical ICA to the skull base. The contrast column terminates into the ophthalmic artery, with occlusion just beyond the ophthalmic artery. There is no spiraling filling defect that would confirm a dissection flap. Right MCA: No filling of the right MCA branches, secondary to occlusion in the supra ophthalmic segment. Right ACA: No filling of the ACA branches, given the occlusion in the siphon. Left vertebral artery: Left PCA: Left  common carotid artery:  Normal course caliber and contour. Left external carotid artery: Patent with antegrade flow. Left internal carotid artery: Normal course caliber and contour of the cervical portion. Vertical and petrous segment patent with normal course caliber contour. Cavernous segment patent. Clinoid segment patent. Antegrade flow of the ophthalmic artery. Ophthalmic segment patent. Terminus patent. Left MCA: M1 segment patent. Insular and opercular segments patent. Unremarkable caliber and course of the cortical segments. Typical arterial, capillary/ parenchymal, and venous phase. There is rapid left-to-right filling across a patent anterior communicating artery, with complete filling of the right anterior cerebral artery territory and the right MCA cerebral territory. There is slight delay throughout all phases of the early, late arterial phase, capillary phase, and parenchymal phase as well as the venous phase compared to the left secondary to the cross fill. The watershed of the ACA and MCA territory is filling, though with delay that corresponds to preoperative CT perfusion imaging. No MCA branch occlusion identified. No significant retrograde filling of the ICA terminus. Left ACA: A 1 segment patent. A 2 segment perfuses the right territory. Completion Findings: Initial TICI of the right carotid siphon: 0 Final TICI of the right carotid siphon: 0 Flat panel CT reveals no intracranial hemorrhage PROCEDURE: The anesthesia team was present to provide general endotracheal tube anesthesia and for patient monitoring during the procedure. Intubation was performed in neuro angio biplane room. Interventional neuro radiology nursing staff was also present. Ultrasound survey of the right inguinal region was performed with images stored and sent to PACs. 11 blade scalpel was used to make a small incision. Blunt dissection was performed with US guidance. A micropuncture needle was used access the right common  femoral artery under ultrasound. With excellent arterial blood flow returned, an .018 micro wire was passed through the needle, observed to enter the abdominal aorta under fluoroscopy. The needle was removed, and a micropuncture sheath was placed over the wire. The inner dilator and wire were removed, and an 035 wire was advanced under fluoroscopy into the abdominal aorta. A standard 5 French sheath was placed, aspirated and flushed. JB 1 catheter was then advanced on the diagnostic wire, advanced to the proximal descending thoracic aorta. Wire was removed and a double flush was performed. A complete cerebral/cervical angiogram was then performed using standard technique with the assistance of a Glidewire, for selection of the right vertebral artery, right common carotid artery/right cervical ICA, left carotid artery, left vertebral artery. The JB 1 catheter was then withdrawn into the proximal descending thoracic aorta, flushed, with flow switch applied. Images were then reviewed with neurology team. After review of the images we elected to proceed with attempted aspiration at the site of occlusion. JB 1 catheter was navigated on the Glidewire into the distal right cervical ICA. Glidewire was removed and a rose in wire was placed into the cervical ICA. JB 1 was removed.  The 5 French sheath was removed and a 25cm 101F straight vascular sheath was placed. The dilator was removed and the sheath was flushed. Sheath was attached to pressurized and heparinized saline bag for constant forward flow. A 95cm 087 "Walrus" balloon guide with coaxial 125cm Berenstein diagnostic catheter was advanced over the Rose an wire to the distal cervical ICA. Double flush of the balloon guide was performed, and attached to rotating hemostatic valve and a pressure bag. Angiogram was performed. Road map function was used once the occluded vessel was identified. A coaxial system was then advanced through the balloon catheter, which included  the selected intermediate catheter, microcatheter, and microwire. In this scenario, the set up included a zoom 71 aspiration catheter, a Trevo Trak microcatheter, and 014 synchro soft wire. This system was advanced through the balloon guide catheter under the road-map function, with adequate back-flush at the rotating hemostatic valve at that back end of the balloon guide. Coaxial system was gently advanced through the cavernous segment, to the level of the ophthalmic artery origin. The microwire and microcatheter were removed when the tip of the aspiration catheter was just proximal to the site of occlusion. Constant aspiration was then initiated using the proprietary engine at the hub of the zoom aspiration catheter. We confirmed continuous return of blood, and then advanced the aspiration catheter gently into the occlusion site beyond the ophthalmic artery origin. The intention was to gently advanced the catheter, and avoid advancing the catheter into the ICA terminus/proximal MCA, given the uncertainty of the source of occlusion (embolus versus dissection). Once we confirmed cessation of return of blood flow as we advanced the catheter, we observed a 30 second time interval with forward pressure on the aspiration catheter, and then withdrew the aspiration catheter through the cavernous segment and into the balloon guide. Free aspiration of blood was observed at the hub of the aspiration catheter. Repeat angiogram was performed demonstrating that the occlusion persisted. 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 Catheter and balloon guide were removed. The skin at the puncture site was then cleaned with Chlorhexidine. The 8 French sheath was  removed and an 101F angioseal was deployed. Flat panel CT was performed. Patient tolerated the procedure well and remained hemodynamically stable throughout. No complications were encountered and no significant blood loss encountered. 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 Angio-Seal deployed for hemostasis. Cervical angiogram reveals decreased caliber of the right cervical ICA, without definitive dissection flap. Cerebral angiogram confirms adequate left to right perfusion of the right-sided MCA territory branches with no MCA branch occlusion. No proximal occlusion identified within the right PCA Signed, Yvone Neu. Reyne Dumas, RPVI Vascular and Interventional Radiology Specialists [facility] Radiology PLAN: The patient will remain intubated. ICU status Urgent MRI/MRA, as diagnostic tool for right carotid siphon occlusion, to determine extent of infarction, as well as if there is obvious dissection flap versus thromboembolic disease as the etiology of occlusion. Target systolic blood pressure of less than 185 systolic Right hip straight time 4 hours Frequent neurovascular checks Electronically Signed   By: Gilmer Mor D.O.   On: 02/15/2021 10:01   CT  HEAD CODE STROKE WO CONTRAST  Result Date: 02/14/2021 CLINICAL DATA:  Code stroke.  Left arm weakness and facial droop EXAM: CT HEAD WITHOUT CONTRAST TECHNIQUE: Contiguous axial images were obtained from the base of the skull through the vertex without intravenous contrast. COMPARISON:  None. FINDINGS: Brain: There is no mass, hemorrhage or extra-axial collection. The size and configuration of the ventricles and extra-axial CSF spaces are normal. The brain parenchyma is normal, without evidence of acute or chronic infarction. Vascular: No abnormal hyperdensity of  the major intracranial arteries or dural venous sinuses. No intracranial atherosclerosis. Skull: The visualized skull base, calvarium and extracranial soft tissues are normal. Sinuses/Orbits: No fluid levels or advanced mucosal thickening of the visualized paranasal sinuses. No mastoid or middle ear effusion. The orbits are normal. ASPECTS [facility] Stroke Program Early CT Score) - Ganglionic level infarction (caudate, lentiform nuclei, internal capsule, insula, M1-M3 cortex): 7 - Supraganglionic infarction (M4-M6 cortex): 3 Total score (0-10 with 10 being normal): 10 IMPRESSION: 1. Normal head CT. 2. ASPECTS is 10. These results were communicated to Dr. Brooke Dare at 10:04 pm on 02/14/2021 by text page via the [facility] messaging system. Electronically Signed   By: Deatra Robinson M.D.   On: 02/14/2021 22:04   CT ANGIO HEAD NECK W WO CM W PERF (CODE STROKE)  Addendum Date: 02/14/2021   ADDENDUM REPORT: 02/14/2021 23:22 ADDENDUM: On further review, there is an area of occlusion of the proximal right posterior communicating artery and the proximal P2 segment of the right posterior cerebral artery. This is likely an extension of the process affecting the distal right internal carotid artery. These results were called by telephone at the time of interpretation on 02/14/2021 at 11:21 pm to provider [facility] , who verbally acknowledged these results. Electronically Signed   By: Deatra Robinson M.D.   On: 02/14/2021 23:22   Result Date: 02/14/2021 CLINICAL DATA:  Left arm weakness and facial droop EXAM: CT ANGIOGRAPHY HEAD AND NECK CT PERFUSION BRAIN TECHNIQUE: Multidetector CT imaging of the head and neck was performed using the standard protocol during bolus administration of intravenous contrast. Multiplanar CT image reconstructions and MIPs were obtained to evaluate the vascular anatomy. Carotid stenosis measurements (when applicable) are obtained utilizing NASCET criteria, using the distal internal carotid  diameter as the denominator. Multiphase CT imaging of the brain was performed following IV bolus contrast injection. Subsequent parametric perfusion maps were calculated using RAPID software. CONTRAST:  OMNIPAQUE IOHEXOL 350 MG/ML SOLN COMPARISON:  None. FINDINGS: CTA NECK FINDINGS SKELETON: There is no bony spinal canal stenosis. No lytic or blastic lesion. OTHER NECK: Normal pharynx, larynx and major salivary glands. No cervical lymphadenopathy. Unremarkable thyroid gland. UPPER CHEST: No pneumothorax or pleural effusion. No nodules or masses. AORTIC ARCH: There is no calcific atherosclerosis of the aortic arch. There is no aneurysm, dissection or hemodynamically significant stenosis of the visualized portion of the aorta. Conventional 3 vessel aortic branching pattern. The visualized proximal subclavian arteries are widely patent. RIGHT CAROTID SYSTEM: There is tapering and hypoenhancement of the distal right internal carotid artery. LEFT CAROTID SYSTEM: Normal without aneurysm, dissection or stenosis. VERTEBRAL ARTERIES: Left dominant configuration. Both origins are clearly patent. There is no dissection, occlusion or flow-limiting stenosis to the skull base (V1-V3 segments). CTA HEAD FINDINGS POSTERIOR CIRCULATION: --Vertebral arteries: Normal V4 segments. --Inferior cerebellar arteries: Normal. --Basilar artery: Normal. --Superior cerebellar arteries: Normal. --Posterior cerebral arteries (PCA): Normal. ANTERIOR CIRCULATION: --Intracranial internal carotid arteries: The right internal carotid artery is occluded at  the distal cavernous segment. It remains occluded to the carotid terminus. The right middle and anterior cerebral arteries fill via flow across the anterior communicating artery. --Anterior cerebral arteries (ACA): Normal. Both A1 segments are present. Patent anterior communicating artery (a-comm). --Middle cerebral arteries (MCA): Normal. VENOUS SINUSES: As permitted by contrast timing,  patent. ANATOMIC VARIANTS: None Review of the MIP images confirms the above findings. CT Brain Perfusion Findings: ASPECTS: 10 CBF (<30%) Volume: 58mL Perfusion (Tmax>6.0s) volume: Mismatch Volume: Infarction Location:No core infarct. Large area of ischemic penumbra within the right hemisphere in a watershed/hypoperfusion type distribution. IMPRESSION: 1. 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 The right middle and anterior cerebral arteries fill via flow across the anterior communicating artery. 2. Large area of ischemic penumbra within the right hemisphere in a watershed/hypoperfusion type distribution. No core infarct. Critical Value/emergent results were called by telephone at the time of interpretation on 02/14/2021 at 10:35 pm to provider [facility] , who verbally acknowledged these results. Electronically Signed: By: Deatra Robinson M.D. On: 02/14/2021 22:36   IR ANGIO INTRA EXTRACRAN SEL COM CAROTID INNOMINATE UNI L MOD SED  Result Date: 02/15/2021 INDICATION: 35 year old male with a history of acute left-sided weakness, ICA occlusion, possible dissection EXAM: ULTRASOUND-GUIDED RIGHT COMMON FEMORAL ARTERY ACCESS FOUR-VESSEL CERVICAL/CEREBRAL ANGIOGRAM ATTEMPTED MECHANICAL THROMBECTOMY RIGHT INTRACRANIAL ICA ANGIO-SEAL FOR HEMOSTASIS COMPARISON:  CT same day MEDICATIONS: None ANESTHESIA/SEDATION: The anesthesia team was present to provide general endotracheal tube anesthesia and for patient monitoring during the procedure. Intubation was performed in biplane room 2. left radial arterial line was performed by the anesthesia team. Interventional neuro radiology nursing staff was also present. CONTRAST:  100 cc FLUOROSCOPY TIME:  Fluoroscopy Time: 13 minutes 30 seconds (2,922 mGy). COMPLICATIONS: None TECHNIQUE: Informed written consent was obtained from the patient's  family after a thorough discussion of the procedural risks, benefits and alternatives. Specific risks discussed include: Bleeding, infection, contrast reaction, kidney injury/failure, need for further procedure/surgery, arterial injury or dissection, embolization to new territory, intracranial hemorrhage (10-15% risk), neurologic deterioration, cardiopulmonary collapse, death. All questions were addressed. Maximal Sterile Barrier Technique was utilized including during the procedure including caps, mask, sterile gowns, sterile gloves, sterile drape, hand hygiene and skin antiseptic. A timeout was performed prior to the initiation of the procedure. The anesthesia team was present to provide general endotracheal tube anesthesia and for patient monitoring during the procedure. Interventional neuro radiology nursing staff was also present. FINDINGS: Initial Findings: Ultrasound of the right inguinal region demonstrates patent right common femoral artery, without significant atherosclerotic plaque. No significant tortuosity of the branch vessels. Right vertebral artery: Normal course caliber and contour of the cervical segment of the right vertebral artery, with patent cervical branches. Anterior cerebral artery visualized with contribution from the lower cervical segment reticular medullary branches as well as the intracranial right vertebral artery. Mild tortuosity of the right V4 segment. No significant atherosclerotic changes of the basilar artery. Slip stream artifact decreases the resolution of the basilar artery. Proximal right and left PCA are patent. There is decreased capillary/parenchymal perfusion on the lateral view within the cuneus. Right common carotid artery:  Normal course caliber and contour. Right external carotid artery: Patent with antegrade flow. Right internal carotid artery: Decreased caliber in the contrast column of the right cervical ICA, with slow filling throughout the length of the right  cervical ICA to the skull base. The contrast column terminates  into the ophthalmic artery, with occlusion just beyond the ophthalmic artery. There is no spiraling filling defect that would confirm a dissection flap. Right MCA: No filling of the right MCA branches, secondary to occlusion in the supra ophthalmic segment. Right ACA: No filling of the ACA branches, given the occlusion in the siphon. Left vertebral artery: Left PCA: Left common carotid artery:  Normal course caliber and contour. Left external carotid artery: Patent with antegrade flow. Left internal carotid artery: Normal course caliber and contour of the cervical portion. Vertical and petrous segment patent with normal course caliber contour. Cavernous segment patent. Clinoid segment patent. Antegrade flow of the ophthalmic artery. Ophthalmic segment patent. Terminus patent. Left MCA: M1 segment patent. Insular and opercular segments patent. Unremarkable caliber and course of the cortical segments. Typical arterial, capillary/ parenchymal, and venous phase. There is rapid left-to-right filling across a patent anterior communicating artery, with complete filling of the right anterior cerebral artery territory and the right MCA cerebral territory. There is slight delay throughout all phases of the early, late arterial phase, capillary phase, and parenchymal phase as well as the venous phase compared to the left secondary to the cross fill. The watershed of the ACA and MCA territory is filling, though with delay that corresponds to preoperative CT perfusion imaging. No MCA branch occlusion identified. No significant retrograde filling of the ICA terminus. Left ACA: A 1 segment patent. A 2 segment perfuses the right territory. Completion Findings: Initial TICI of the right carotid siphon: 0 Final TICI of the right carotid siphon: 0 Flat panel CT reveals no intracranial hemorrhage PROCEDURE: The anesthesia team was present to provide general endotracheal  tube anesthesia and for patient monitoring during the procedure. Intubation was performed in neuro angio biplane room. Interventional neuro radiology nursing staff was also present. Ultrasound survey of the right inguinal region was performed with images stored and sent to PACs. 11 blade scalpel was used to make a small incision. Blunt dissection was performed with US guidance. A micropuncture needle was used access the right common femoral artery under ultrasound. With excellent arterial blood flow returned, an .018 micro wire was passed through the needle, observed to enter the abdominal aorta under fluoroscopy. The needle was removed, and a micropuncture sheath was placed over the wire. The inner dilator and wire were removed, and an 035 wire was advanced under fluoroscopy into the abdominal aorta. A standard 5 French sheath was placed, aspirated and flushed. JB 1 catheter was then advanced on the diagnostic wire, advanced to the proximal descending thoracic aorta. Wire was removed and a double flush was performed. A complete cerebral/cervical angiogram was then performed using standard technique with the assistance of a Glidewire, for selection of the right vertebral artery, right common carotid artery/right cervical ICA, left carotid artery, left vertebral artery. The JB 1 catheter was then withdrawn into the proximal descending thoracic aorta, flushed, with flow switch applied. Images were then reviewed with neurology team. After review of the images we elected to proceed with attempted aspiration at the site of occlusion. JB 1 catheter was navigated on the Glidewire into the distal right cervical ICA. Glidewire was removed and a rose in wire was placed into the cervical ICA. JB 1 was removed. The 5 French sheath was removed and a 25cm 63F straight vascular sheath was placed. The dilator was removed and the sheath was flushed. Sheath was attached to pressurized and heparinized saline bag for constant forward  flow. A 95cm 087 "Walrus" balloon guide with  coaxial 125cm Berenstein diagnostic catheter was advanced over the Rose an wire to the distal cervical ICA. Double flush of the balloon guide was performed, and attached to rotating hemostatic valve and a pressure bag. Angiogram was performed. Road map function was used once the occluded vessel was identified. A coaxial system was then advanced through the balloon catheter, which included the selected intermediate catheter, microcatheter, and microwire. In this scenario, the set up included a zoom 71 aspiration catheter, a Trevo Trak microcatheter, and 014 synchro soft wire. This system was advanced through the balloon guide catheter under the road-map function, with adequate back-flush at the rotating hemostatic valve at that back end of the balloon guide. Coaxial system was gently advanced through the cavernous segment, to the level of the ophthalmic artery origin. The microwire and microcatheter were removed when the tip of the aspiration catheter was just proximal to the site of occlusion. Constant aspiration was then initiated using the proprietary engine at the hub of the zoom aspiration catheter. We confirmed continuous return of blood, and then advanced the aspiration catheter gently into the occlusion site beyond the ophthalmic artery origin. The intention was to gently advanced the catheter, and avoid advancing the catheter into the ICA terminus/proximal MCA, given the uncertainty of the source of occlusion (embolus versus dissection). Once we confirmed cessation of return of blood flow as we advanced the catheter, we observed a 30 second time interval with forward pressure on the aspiration catheter, and then withdrew the aspiration catheter through the cavernous segment and into the balloon guide. Free aspiration of blood was observed at the hub of the aspiration catheter. Repeat angiogram was performed demonstrating that the occlusion persisted. Given the  working diagnosis of a carotid dissection as the source of the occlusion, based on preoperative imaging, we elected to terminate the procedure at this time as we confirmed adequate left-to-right perfusion of the MCA territory, with no MCA occlusion identified on the initial cervical/cerebral angiogram. Catheter and balloon guide were removed. The skin at the puncture site was then cleaned with Chlorhexidine. The 8 French sheath was removed and an 3F angioseal was deployed. Flat panel CT was performed. Patient tolerated the procedure well and remained hemodynamically stable throughout. No complications were encountered and no significant blood loss encountered. 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 Angio-Seal deployed for hemostasis. Cervical angiogram reveals decreased caliber of the right cervical ICA, without definitive dissection flap. Cerebral angiogram confirms adequate left to right perfusion of the right-sided MCA territory branches with no MCA branch occlusion. No proximal occlusion identified within the right PCA Signed, Yvone Neu. Reyne Dumas, RPVI [facility] Radiology PLAN: The patient will remain intubated. ICU status Urgent MRI/MRA, as diagnostic tool for right carotid siphon occlusion, to determine extent of infarction, as well as if there is obvious dissection flap versus thromboembolic disease as the etiology of occlusion. Target systolic blood pressure of less than 185 systolic Right hip straight time 4 hours Frequent neurovascular checks Electronically Signed   By: Gilmer Mor D.O.   On: 02/15/2021 10:01   IR ANGIO VERTEBRAL SEL VERTEBRAL BILAT MOD SED  Result Date: 02/15/2021 INDICATION: 35 year old male with a history of acute left-sided  weakness, ICA occlusion, possible dissection EXAM: ULTRASOUND-GUIDED RIGHT COMMON FEMORAL ARTERY ACCESS FOUR-VESSEL CERVICAL/CEREBRAL ANGIOGRAM ATTEMPTED MECHANICAL THROMBECTOMY RIGHT INTRACRANIAL ICA ANGIO-SEAL FOR HEMOSTASIS  COMPARISON:  CT same day MEDICATIONS: None ANESTHESIA/SEDATION: The anesthesia team was present to provide general endotracheal tube anesthesia and for patient monitoring during the procedure. Intubation was performed in biplane room 2. left radial arterial line was performed by the anesthesia team. Interventional neuro radiology nursing staff was also present. CONTRAST:  100 cc FLUOROSCOPY TIME:  Fluoroscopy Time: 13 minutes 30 seconds (2,922 mGy). COMPLICATIONS: None TECHNIQUE: Informed written consent was obtained from the patient's family after a thorough discussion of the procedural risks, benefits and alternatives. Specific risks discussed include: Bleeding, infection, contrast reaction, kidney injury/failure, need for further procedure/surgery, arterial injury or dissection, embolization to new territory, intracranial hemorrhage (10-15% risk), neurologic deterioration, cardiopulmonary collapse, death. All questions were addressed. Maximal Sterile Barrier Technique was utilized including during the procedure including caps, mask, sterile gowns, sterile gloves, sterile drape, hand hygiene and skin antiseptic. A timeout was performed prior to the initiation of the procedure. The anesthesia team was present to provide general endotracheal tube anesthesia and for patient monitoring during the procedure. Interventional neuro radiology nursing staff was also present. FINDINGS: Initial Findings: Ultrasound of the right inguinal region demonstrates patent right common femoral artery, without significant atherosclerotic plaque. No significant tortuosity of the branch vessels. Right vertebral artery: Normal course caliber and contour of the cervical segment of the right vertebral artery, with  patent cervical branches. Anterior cerebral artery visualized with contribution from the lower cervical segment reticular medullary branches as well as the intracranial right vertebral artery. Mild tortuosity of the right V4 segment. No significant atherosclerotic changes of the basilar artery. Slip stream artifact decreases the resolution of the basilar artery. Proximal right and left PCA are patent. There is decreased capillary/parenchymal perfusion on the lateral view within the cuneus. Right common carotid artery:  Normal course caliber and contour. Right external carotid artery: Patent with antegrade flow. Right internal carotid artery: Decreased caliber in the contrast column of the right cervical ICA, with slow filling throughout the length of the right cervical ICA to the skull base. The contrast column terminates into the ophthalmic artery, with occlusion just beyond the ophthalmic artery. There is no spiraling filling defect that would confirm a dissection flap. Right MCA: No filling of the right MCA branches, secondary to occlusion in the supra ophthalmic segment. Right ACA: No filling of the ACA branches, given the occlusion in the siphon. Left vertebral artery: Left PCA: Left common carotid artery:  Normal course caliber and contour. Left external carotid artery: Patent with antegrade flow. Left internal carotid artery: Normal course caliber and contour of the cervical portion. Vertical and petrous segment patent with normal course caliber contour. Cavernous segment patent. Clinoid segment patent. Antegrade flow of the ophthalmic artery. Ophthalmic segment patent. Terminus patent. Left MCA: M1 segment patent. Insular and opercular segments patent. Unremarkable caliber and course of the cortical segments. Typical arterial, capillary/ parenchymal, and venous phase. There is rapid left-to-right filling across a patent anterior communicating artery, with complete filling of the right anterior cerebral  artery territory and the right MCA cerebral territory. There is slight delay throughout all phases of the early, late arterial phase, capillary phase, and parenchymal phase as well as the venous phase compared to the left secondary to the cross fill. The watershed of the ACA and MCA territory is filling, though with delay that corresponds to preoperative CT perfusion imaging. No MCA branch occlusion identified. No significant retrograde filling of the ICA terminus. Left ACA: A 1 segment patent. A 2 segment perfuses the right territory.  Completion Findings: Initial TICI of the right carotid siphon: 0 Final TICI of the right carotid siphon: 0 Flat panel CT reveals no intracranial hemorrhage PROCEDURE: The anesthesia team was present to provide general endotracheal tube anesthesia and for patient monitoring during the procedure. Intubation was performed in neuro angio biplane room. Interventional neuro radiology nursing staff was also present. Ultrasound survey of the right inguinal region was performed with images stored and sent to PACs. 11 blade scalpel was used to make a small incision. Blunt dissection was performed with US guidance. A micropuncture needle was used access the right common femoral artery under ultrasound. With excellent arterial blood flow returned, an .018 micro wire was passed through the needle, observed to enter the abdominal aorta under fluoroscopy. The needle was removed, and a micropuncture sheath was placed over the wire. The inner dilator and wire were removed, and an 035 wire was advanced under fluoroscopy into the abdominal aorta. A standard 5 French sheath was placed, aspirated and flushed. JB 1 catheter was then advanced on the diagnostic wire, advanced to the proximal descending thoracic aorta. Wire was removed and a double flush was performed. A complete cerebral/cervical angiogram was then performed using standard technique with the assistance of a Glidewire, for selection of the  right vertebral artery, right common carotid artery/right cervical ICA, left carotid artery, left vertebral artery. The JB 1 catheter was then withdrawn into the proximal descending thoracic aorta, flushed, with flow switch applied. Images were then reviewed with neurology team. After review of the images we elected to proceed with attempted aspiration at the site of occlusion. JB 1 catheter was navigated on the Glidewire into the distal right cervical ICA. Glidewire was removed and a rose in wire was placed into the cervical ICA. JB 1 was removed. The 5 French sheath was removed and a 25cm 21F straight vascular sheath was placed. The dilator was removed and the sheath was flushed. Sheath was attached to pressurized and heparinized saline bag for constant forward flow. A 95cm 087 "Walrus" balloon guide with coaxial 125cm Berenstein diagnostic catheter was advanced over the Rose an wire to the distal cervical ICA. Double flush of the balloon guide was performed, and attached to rotating hemostatic valve and a pressure bag. Angiogram was performed. Road map function was used once the occluded vessel was identified. A coaxial system was then advanced through the balloon catheter, which included the selected intermediate catheter, microcatheter, and microwire. In this scenario, the set up included a zoom 71 aspiration catheter, a Trevo Trak microcatheter, and 014 synchro soft wire. This system was advanced through the balloon guide catheter under the road-map function, with adequate back-flush at the rotating hemostatic valve at that back end of the balloon guide. Coaxial system was gently advanced through the cavernous segment, to the level of the ophthalmic artery origin. The microwire and microcatheter were removed when the tip of the aspiration catheter was just proximal to the site of occlusion. Constant aspiration was then initiated using the proprietary engine at the hub of the zoom aspiration catheter. We  confirmed continuous return of blood, and then advanced the aspiration catheter gently into the occlusion site beyond the ophthalmic artery origin. The intention was to gently advanced the catheter, and avoid advancing the catheter into the ICA terminus/proximal MCA, given the uncertainty of the source of occlusion (embolus versus dissection). Once we confirmed cessation of return of blood flow as we advanced the catheter, we observed a 30 second time interval with forward pressure  on the aspiration catheter, and then withdrew the aspiration catheter through the cavernous segment and into the balloon guide. Free aspiration of blood was observed at the hub of the aspiration catheter. Repeat angiogram was performed demonstrating that the occlusion persisted. 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 Catheter and balloon guide were removed. The skin at the puncture site was then cleaned with Chlorhexidine. The 8 French sheath was removed and an 68F angioseal was deployed. Flat panel CT was performed. Patient tolerated the procedure well and remained hemodynamically stable throughout. No complications were encountered and no significant blood loss encountered. 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 Angio-Seal deployed for hemostasis. Cervical angiogram reveals decreased caliber of the right cervical ICA, without definitive dissection flap. Cerebral angiogram confirms adequate left to right perfusion of the right-sided MCA territory branches with no MCA  branch occlusion. No proximal occlusion identified within the right PCA Signed, Yvone Neu. Reyne Dumas, RPVI [facility] Radiology PLAN: The patient will remain intubated. ICU status Urgent MRI/MRA, as diagnostic tool for right carotid siphon occlusion, to determine extent of infarction, as well as if there is obvious dissection flap versus thromboembolic disease as the etiology of occlusion. Target systolic blood pressure of less than 185 systolic Right hip straight time 4 hours Frequent neurovascular checks Electronically Signed   By: Gilmer Mor D.O.   On: 02/15/2021 10:01    Labs:  CBC: Recent Labs    02/14/21 2150 02/14/21 2209 02/15/21 0418  WBC 13.9*  --   --   HGB 17.9* 17.3* 16.0  HCT 50.4 51.0 47.0  PLT 176  --   --     COAGS: Recent Labs    02/14/21 2150  INR 1.0  APTT 27    BMP: Recent Labs    02/14/21 2150 02/14/21 2209 02/15/21 0418  NA 139 140 139  K 4.0 4.2 3.5  CL 101 103  --   CO2 28  --   --   GLUCOSE 131* 127*  --   BUN 13 16  --   CALCIUM 9.7  --   --   CREATININE 0.70 0.70  --   GFRNONAA >60  --   --     LIVER FUNCTION TESTS: Recent Labs    02/14/21 2150  BILITOT 1.3*  AST 31  ALT 58*  ALKPHOS 64  PROT 7.6  ALBUMIN 4.4    Assessment and Plan: R ICA occlusion s/p angiogram with single aspiration attempt at mechanical thrombectomy with was unsuccessful, TICI 0. Unknown spontaneous dissection or thrombo-embolic disease as source, however complete filling of the right MCA branches secondary to left-right flow Patient assessed at bedside alongside Dr. Loreta Ave.  Patient is arousable, but appears sleepy.  Able to follow commands.  Moving right side spontaneously, however noted with left facial droop, no movement in left hand/arm, and minimal movement with left toes.  To progress with therapies today s/p extubation.  Groin site stable; intact.  No follow-up with NIR indicated.  Further plans per  Neuro team.   Electronically Signed: Hoyt Koch, PA 02/15/2021, 11:06 AM  I spent a total of 25 Minutes at the the patient's bedside AND on the patient's hospital floor or unit, greater than 50% of which was counseling/coordinating care for R ICA occlusion.

## 2021-02-15 NOTE — Transfer of Care (Signed)
Immediate Anesthesia Transfer of Care Note  Patient: David Craig  Procedure(s) Performed: IR WITH ANESTHESIA  Patient Location: icu  Anesthesia Type:General  Level of Consciousness: Patient remains intubated per anesthesia plan  Airway & Oxygen Therapy: Patient remains intubated per anesthesia plan and Patient placed on Ventilator (see vital sign flow sheet for setting)  Post-op Assessment: Report given to RN and Post -op Vital signs reviewed and stable  Post vital signs: Reviewed and stable  Last Vitals:  Vitals Value Taken Time  BP 165/109 02/15/21 0103  Temp    Pulse 89 02/15/21 0110  Resp 17 02/15/21 0110  SpO2 100 % 02/15/21 0110  Vitals shown include unvalidated device data.  Last Pain:  Vitals:   02/14/21 2222  TempSrc:   PainSc: 0-No pain         Complications: No notable events documented.

## 2021-02-15 NOTE — Sedation Documentation (Signed)
Transported patient to [id number] with CRNA. Bedside report given to Taylar RN. Right groin assessed no hematoma dressing clean and intact. PT and DP pulses +2.

## 2021-02-15 NOTE — Progress Notes (Addendum)
STROKE TEAM PROGRESS NOTE   INTERVAL HISTORY His wife and mother are at the bedside.    Briefly, this is a 35 year old gentleman with history of polysubstance abuse, cocaine tobacco breathing oxycodone, migraine headaches, obesity presenting yesterday.  He had a headache, which was typical for his migraines, he also noticed  left-sided weakness at 11 AM but went back to sleep..  Seen as a stroke alert, as he was found to have left-sided hemiparesis and right gaze movements.  Pleasant young Caucasian male sitting in bedside chair He did have a favorable aspect score CT so thrombectomy was attempted.  After 1 unsuccessful attempt this procedure was aborted without successful retrieval.  There was initial question dissection but MR angiogram did not support this.  Patient had good collateral flow from the opposite MCA .  MRI showed right PCA infarct and tiny right parietal white matter infarct. Marland Kitchen   He was out of the window for thrombolytic treatment.  Vitals:   02/15/21 1100 02/15/21 1200 02/15/21 1300 02/15/21 1400  BP: 131/81 125/82 119/88 118/68  Pulse: 71 69 70 67  Resp: _0 Temp:  98.6 F (37 C)    TempSrc:  Oral    SpO2: 97% 98% 93% 96%  Weight:      Height:       CBC:  Recent Labs  Lab 02/14/21 2150 02/14/21 2209 02/15/21 0418  WBC 13.9*  --   --   NEUTROABS 11.9*  --   --   HGB 17.9* 17.3* 16.0  HCT 50.4 51.0 47.0  MCV 87.5  --   --   PLT 176  --   --    Basic Metabolic Panel:  Recent Labs  Lab 02/14/21 2150 02/14/21 2209 02/15/21 0418  NA 139 140 139  K 4.0 4.2 3.5  CL 101 103  --   CO2 28  --   --   GLUCOSE 131* 127*  --   BUN 13 16  --   CREATININE 0.70 0.70  --   CALCIUM 9.7  --   --    Lipid Panel:  Recent Labs  Lab 02/15/21 0243  CHOL 197  TRIG 190*  HDL 33*  CHOLHDL 6.0  VLDL 38  LDLCALC 126*   HgbA1c:  Recent Labs  Lab 02/15/21 0243  HGBA1C 5.0   Urine Drug Screen:  Recent Labs  Lab 02/15/21 0243  LABOPIA NONE DETECTED   COCAINSCRNUR POSITIVE*  LABBENZ NONE DETECTED  AMPHETMU NONE DETECTED  THCU POSITIVE*  LABBARB NONE DETECTED    Alcohol Level  Recent Labs  Lab 02/14/21 2150  ETH <10    IMAGING past 24 hours MR ANGIO HEAD WO CONTRAST  Result Date: 02/15/2021 EXAM: MRI HEAD WITHOUT CONTRAST MRA HEAD WITHOUT CONTRAST TECHNIQUE: Multiplanar, multi-echo pulse sequences of the brain and surrounding structures were acquired without intravenous contrast. Angiographic images of the Circle of Willis were acquired using MRA technique without intravenous contrast. COMPARISON:  No pertinent prior exam. FINDINGS: MRI HEAD FINDINGS A tailored MRI protocol was used, consisting of susceptibility weighted images, diffusion-weighted images, fat-suppressed axial T1-weighted images, axial T2-weighted images and axial FLAIR sequence. There is multifocal acute ischemia within the right PCA territory, involving the right occipital lobe, medial right temporal lobe and the ventrolateral thalamus. There is a punctate focus of acute ischemia in the right parietal white matter. The right internal carotid artery flow void is abnormal beginning at the level of the C2-3 disc space and continuing to the  carotid terminus. Susceptibility weighted imaging shows magnetic susceptibility effect within the portion of the right internal carotid artery corresponding to the occluded area on the earlier CTA of the head and neck and also corresponding to the area of the abnormal T2-weighted imaging flow void. MRA HEAD FINDINGS POSTERIOR CIRCULATION: --Vertebral arteries: Normal --Inferior cerebellar arteries: Normal. --Basilar artery: Normal. --Superior cerebellar arteries: Normal. --Posterior cerebral arteries: There is partial occlusion of the right P1 segment. There is a small right posterior communicating artery that is also partially occluded. Left PCA is normal. There is reconstitution of the right PCA at the mid P2 segment. ANTERIOR CIRCULATION:  --Intracranial internal carotid arteries: Diminished flow related enhancement of the distal right ICA loss of enhancement at approximately the level of the ophthalmic artery. Left ICA is normal. --Anterior cerebral arteries (ACA): Normal. Both A1 segments are present. The anterior communicating artery is patent. --Middle cerebral arteries (MCA): Normal. The right MCA fills via flow across the anterior pathway of the circle-of-Willis. Fat-suppressed T1-weighted imaging shows no evidence of intramural thrombus within the right ICA. IMPRESSION: 1. Multifocal acute ischemia within the right PCA territory, involving the right occipital lobe, medial right temporal lobe and the ventrolateral thalamus/posterior limb of the internal capsule. The thalamocapsular infarct could affect the optic radiation. 2. Occlusion of the distal right internal carotid artery, as previously demonstrated by CTA. 3. Findings of this study favor thrombus within the distal right ICA. No clear evidence of dissection. 4. Right PCA P1 segment occlusion. 5. The right MCA fills via flow across the anterior pathway of the circle-of-Willis. These results were called by telephone at the time of interpretation on 02/15/2021 at 2:35 am to Drs. SRISHTI BHAGAT and Corrie Mckusick, who verbally acknowledged these results. Electronically Signed   By: Ulyses Jarred M.D.   On: 02/15/2021 02:50   MR BRAIN WO CONTRAST  Result Date: 02/15/2021 : EXAM: MRI HEAD WITHOUT CONTRAST MRA HEAD WITHOUT CONTRAST TECHNIQUE: Multiplanar, multi-echo pulse sequences of the brain and surrounding structures were acquired without intravenous contrast. Angiographic images of the Circle of Willis were acquired using MRA technique without intravenous contrast. COMPARISON:   No pertinent prior exam. FINDINGS: MRI HEAD FINDINGS A tailored MRI protocol was used, consisting of susceptibility weighted images, diffusion-weighted images, fat-suppressed axial T1-weighted images, axial  T2-weighted images and axial FLAIR sequence. There is multifocal acute ischemia within the right PCA territory, involving the right occipital lobe, medial right temporal lobe and the ventrolateral thalamus. There is a punctate focus of acute ischemia in the right parietal white matter. The right internal carotid artery flow void is abnormal beginning at the level of the C2-3 disc space and continuing to the carotid terminus. Susceptibility weighted imaging shows magnetic susceptibility effect within the portion of the right internal carotid artery corresponding to the occluded area on the earlier CTA of the head and neck and also corresponding to the area of the abnormal T2-weighted imaging flow void. MRA HEAD FINDINGS POSTERIOR CIRCULATION: --Vertebral arteries: Normal --Inferior cerebellar arteries: Normal. --Basilar artery: Normal. --Superior cerebellar arteries: Normal. --Posterior cerebral arteries: There is partial occlusion of the right P1 segment. There is a small right posterior communicating artery that is also partially occluded. Left PCA is normal. There is reconstitution of the right PCA at the mid P2 segment. ANTERIOR CIRCULATION: --Intracranial internal carotid arteries: Diminished flow related enhancement of the distal right ICA loss of enhancement at approximately the level of the ophthalmic artery. Left ICA is normal. --Anterior cerebral arteries (ACA): Normal. Both A1  segments are present. The anterior communicating artery is patent. --Middle cerebral arteries (MCA): Normal. The right MCA fills via flow across the anterior pathway of the circle-of-Willis. Fat-suppressed T1-weighted imaging shows no evidence of intramural thrombus within the right ICA. IMPRESSION: 1. Multifocal acute ischemia within the right PCA territory, involving the right occipital lobe, medial right temporal lobe and the ventrolateral thalamus/posterior limb of the internal capsule. The thalamocapsular infarct could affect  the optic radiation. 2. Occlusion of the distal right internal carotid artery, as previously demonstrated by CTA. 3. Findings of this study favor thrombus within the distal right ICA. No clear evidence of dissection. 4. Right PCA P1 segment occlusion. 5. The right MCA fills via flow across the anterior pathway of the circle-of-Willis. These results were called by telephone at the time of interpretation on 02/15/2021 at 2: 35 am to Drs. SRISHTI BHAGAT and Corrie Mckusick, who verbally acknowledged these results. Electronically Signed   By: Ulyses Jarred M.D.   On: 02/15/2021 03:22   IR CT Head Ltd  Result Date: 02/15/2021 INDICATION: 35 year old male with a history of acute left-sided weakness, ICA occlusion, possible dissection EXAM: ULTRASOUND-GUIDED RIGHT COMMON FEMORAL ARTERY ACCESS FOUR-VESSEL CERVICAL/CEREBRAL ANGIOGRAM ATTEMPTED MECHANICAL THROMBECTOMY RIGHT INTRACRANIAL ICA ANGIO-SEAL FOR HEMOSTASIS COMPARISON:  CT same day MEDICATIONS: None ANESTHESIA/SEDATION: The anesthesia team was present to provide general endotracheal tube anesthesia and for patient monitoring during the procedure. Intubation was performed in biplane room 2. left radial arterial line was performed by the anesthesia team. Interventional neuro radiology nursing staff was also present. CONTRAST:  100 cc FLUOROSCOPY TIME:  Fluoroscopy Time: 13 minutes 30 seconds (2,922 mGy). COMPLICATIONS: None TECHNIQUE: Informed written consent was obtained from the patient's family after a thorough discussion of the procedural risks, benefits and alternatives. Specific risks discussed include: Bleeding, infection, contrast reaction, kidney injury/failure, need for further procedure/surgery, arterial injury or dissection, embolization to new territory, intracranial hemorrhage (10-15% risk), neurologic deterioration, cardiopulmonary collapse, death. All questions were addressed. Maximal Sterile Barrier Technique was utilized including during the procedure  including caps, mask, sterile gowns, sterile gloves, sterile drape, hand hygiene and skin antiseptic. A timeout was performed prior to the initiation of the procedure. The anesthesia team was present to provide general endotracheal tube anesthesia and for patient monitoring during the procedure. Interventional neuro radiology nursing staff was also present. FINDINGS: Initial Findings: Ultrasound of the right inguinal region demonstrates patent right common femoral artery, without significant atherosclerotic plaque. No significant tortuosity of the branch vessels. Right vertebral artery: Normal course caliber and contour of the cervical segment of the right vertebral artery, with patent cervical branches. Anterior cerebral artery visualized with contribution from the lower cervical segment reticular medullary branches as well as the intracranial right vertebral artery. Mild tortuosity of the right V4 segment. No significant atherosclerotic changes of the basilar artery. Slip stream artifact decreases the resolution of the basilar artery. Proximal right and left PCA are patent. There is decreased capillary/parenchymal perfusion on the lateral view within the cuneus. Right common carotid artery:  Normal course caliber and contour. Right external carotid artery: Patent with antegrade flow. Right internal carotid artery: Decreased caliber in the contrast column of the right cervical ICA, with slow filling throughout the length of the right cervical ICA to the skull base. The contrast column terminates into the ophthalmic artery, with occlusion just beyond the ophthalmic artery. There is no spiraling filling defect that would confirm a dissection flap. Right MCA: No filling of the right MCA branches, secondary to occlusion  in the supra ophthalmic segment. Right ACA: No filling of the ACA branches, given the occlusion in the siphon. Left vertebral artery: Left PCA: Left common carotid artery:  Normal course caliber and  contour. Left external carotid artery: Patent with antegrade flow. Left internal carotid artery: Normal course caliber and contour of the cervical portion. Vertical and petrous segment patent with normal course caliber contour. Cavernous segment patent. Clinoid segment patent. Antegrade flow of the ophthalmic artery. Ophthalmic segment patent. Terminus patent. Left MCA: M1 segment patent. Insular and opercular segments patent. Unremarkable caliber and course of the cortical segments. Typical arterial, capillary/ parenchymal, and venous phase. There is rapid left-to-right filling across a patent anterior communicating artery, with complete filling of the right anterior cerebral artery territory and the right MCA cerebral territory. There is slight delay throughout all phases of the early, late arterial phase, capillary phase, and parenchymal phase as well as the venous phase compared to the left secondary to the cross fill. The watershed of the [place] and MCA territory is filling, though with delay that corresponds to preoperative CT perfusion imaging. No MCA branch occlusion identified. No significant retrograde filling of the ICA terminus. Left ACA: A 1 segment patent. A 2 segment perfuses the right territory. Completion Findings: Initial TICI of the right carotid siphon: 0 Final TICI of the right carotid siphon: 0 Flat panel CT reveals no intracranial hemorrhage PROCEDURE: The anesthesia team was present to provide general endotracheal tube anesthesia and for patient monitoring during the procedure. Intubation was performed in neuro angio biplane room. Interventional neuro radiology nursing staff was also present. Ultrasound survey of the right inguinal region was performed with images stored and sent to PACs. 11 blade scalpel was used to make a small incision. Blunt dissection was performed with US guidance. A micropuncture needle was used access the right common femoral artery under ultrasound. With excellent  arterial blood flow returned, an .018 micro wire was passed through the needle, observed to enter the abdominal aorta under fluoroscopy. The needle was removed, and a micropuncture sheath was placed over the wire. The inner dilator and wire were removed, and an 035 wire was advanced under fluoroscopy into the abdominal aorta. A standard 5 French sheath was placed, aspirated and flushed. JB 1 catheter was then advanced on the diagnostic wire, advanced to the proximal descending thoracic aorta. Wire was removed and a double flush was performed. A complete cerebral/cervical angiogram was then performed using standard technique with the assistance of a Glidewire, for selection of the right vertebral artery, right common carotid artery/right cervical ICA, left carotid artery, left vertebral artery. The JB 1 catheter was then withdrawn into the proximal descending thoracic aorta, flushed, with flow switch applied. Images were then reviewed with neurology team. After review of the images we elected to proceed with attempted aspiration at the site of occlusion. JB 1 catheter was navigated on the Glidewire into the distal right cervical ICA. Glidewire was removed and a rose in wire was placed into the cervical ICA. JB 1 was removed. The 5 French sheath was removed and a 25cm 24F straight vascular sheath was placed. The dilator was removed and the sheath was flushed. Sheath was attached to pressurized and heparinized saline bag for constant forward flow. A 95cm 087 "[place]rus" balloon guide with coaxial 125cm Berenstein diagnostic catheter was advanced over the Rose an wire to the distal cervical ICA. Double flush of the balloon guide was performed, and attached to rotating hemostatic valve and a pressure bag.  Angiogram was performed. Road map function was used once the occluded vessel was identified. A coaxial system was then advanced through the balloon catheter, which included the selected intermediate catheter, microcatheter,  and microwire. In this scenario, the set up included a zoom 71 aspiration catheter, a Trevo Trak microcatheter, and 014 synchro soft wire. This system was advanced through the balloon guide catheter under the road-map function, with adequate back-flush at the rotating hemostatic valve at that back end of the balloon guide. Coaxial system was gently advanced through the cavernous segment, to the level of the ophthalmic artery origin. The microwire and microcatheter were removed when the tip of the aspiration catheter was just proximal to the site of occlusion. Constant aspiration was then initiated using the proprietary engine at the hub of the zoom aspiration catheter. We confirmed continuous return of blood, and then advanced the aspiration catheter gently into the occlusion site beyond the ophthalmic artery origin. The intention was to gently advanced the catheter, and avoid advancing the catheter into the ICA terminus/proximal MCA, given the uncertainty of the source of occlusion (embolus versus dissection). Once we confirmed cessation of return of blood flow as we advanced the catheter, we observed a 30 second time interval with forward pressure on the aspiration catheter, and then withdrew the aspiration catheter through the cavernous segment and into the balloon guide. Free aspiration of blood was observed at the hub of the aspiration catheter. Repeat angiogram was performed demonstrating that the occlusion persisted. 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 Catheter and balloon guide were removed. The skin at the puncture site was then cleaned with Chlorhexidine. The 8 French sheath was removed and an 68F angioseal was deployed. Flat panel CT  was performed. Patient tolerated the procedure well and remained hemodynamically stable throughout. No complications were encountered and no significant blood loss encountered. 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 Angio-Seal deployed for hemostasis. Cervical angiogram reveals decreased caliber of the right cervical ICA, without definitive dissection flap. Cerebral angiogram confirms adequate left to right perfusion of the right-sided MCA territory branches with no MCA branch occlusion. No proximal occlusion identified within the right PCA Signed, Dulcy Fanny. Dellia Nims, [facility] Radiology PLAN: The patient will remain intubated. ICU status Urgent MRI/MRA, as diagnostic tool for right carotid siphon occlusion, to determine extent of infarction, as well as if there is obvious dissection flap versus thromboembolic disease as the etiology of occlusion. Target systolic blood pressure of less than 716 systolic Right hip straight time 4 hours Frequent neurovascular checks Electronically Signed   By: Corrie Mckusick D.O.   On: 02/15/2021 10:01   IR US Guide Vasc Access Right  Result Date: 02/15/2021 INDICATION: 35 year old male with a history of acute left-sided weakness, ICA occlusion, possible dissection EXAM: ULTRASOUND-GUIDED RIGHT COMMON FEMORAL ARTERY ACCESS FOUR-VESSEL CERVICAL/CEREBRAL ANGIOGRAM ATTEMPTED MECHANICAL THROMBECTOMY RIGHT INTRACRANIAL ICA ANGIO-SEAL FOR HEMOSTASIS COMPARISON:  CT same day MEDICATIONS: None ANESTHESIA/SEDATION: The anesthesia team was present to provide general endotracheal tube anesthesia and for patient monitoring during the procedure. Intubation was performed in biplane room 2. left radial arterial line  was performed by the  anesthesia team. Interventional neuro radiology nursing staff was also present. CONTRAST:  100 cc FLUOROSCOPY TIME:  Fluoroscopy Time: 13 minutes 30 seconds (2,922 mGy). COMPLICATIONS: None TECHNIQUE: Informed written consent was obtained from the patient's family after a thorough discussion of the procedural risks, benefits and alternatives. Specific risks discussed include: Bleeding, infection, contrast reaction, kidney injury/failure, need for further procedure/surgery, arterial injury or dissection, embolization to new territory, intracranial hemorrhage (10-15% risk), neurologic deterioration, cardiopulmonary collapse, death. All questions were addressed. Maximal Sterile Barrier Technique was utilized including during the procedure including caps, mask, sterile gowns, sterile gloves, sterile drape, hand hygiene and skin antiseptic. A timeout was performed prior to the initiation of the procedure. The anesthesia team was present to provide general endotracheal tube anesthesia and for patient monitoring during the procedure. Interventional neuro radiology nursing staff was also present. FINDINGS: Initial Findings: Ultrasound of the right inguinal region demonstrates patent right common femoral artery, without significant atherosclerotic plaque. No significant tortuosity of the branch vessels. Right vertebral artery: Normal course caliber and contour of the cervical segment of the right vertebral artery, with patent cervical branches. Anterior cerebral artery visualized with contribution from the lower cervical segment reticular medullary branches as well as the intracranial right vertebral artery. Mild tortuosity of the right V4 segment. No significant atherosclerotic changes of the basilar artery. Slip stream artifact decreases the resolution of the basilar artery. Proximal right and left PCA are patent. There is decreased capillary/parenchymal perfusion on the lateral view within the cuneus. Right common carotid  artery:  Normal course caliber and contour. Right external carotid artery: Patent with antegrade flow. Right internal carotid artery: Decreased caliber in the contrast column of the right cervical ICA, with slow filling throughout the length of the right cervical ICA to the skull base. The contrast column terminates into the ophthalmic artery, with occlusion just beyond the ophthalmic artery. There is no spiraling filling defect that would confirm a dissection flap. Right MCA: No filling of the right MCA branches, secondary to occlusion in the supra ophthalmic segment. Right ACA: No filling of the ACA branches, given the occlusion in the siphon. Left vertebral artery: Left PCA: Left common carotid artery:  Normal course caliber and contour. Left external carotid artery: Patent with antegrade flow. Left internal carotid artery: Normal course caliber and contour of the cervical portion. Vertical and petrous segment patent with normal course caliber contour. Cavernous segment patent. Clinoid segment patent. Antegrade flow of the ophthalmic artery. Ophthalmic segment patent. Terminus patent. Left MCA: M1 segment patent. Insular and opercular segments patent. Unremarkable caliber and course of the cortical segments. Typical arterial, capillary/ parenchymal, and venous phase. There is rapid left-to-right filling across a patent anterior communicating artery, with complete filling of the right anterior cerebral artery territory and the right MCA cerebral territory. There is slight delay throughout all phases of the early, late arterial phase, capillary phase, and parenchymal phase as well as the venous phase compared to the left secondary to the cross fill. The watershed of the [place] and MCA territory is filling, though with delay that corresponds to preoperative CT perfusion imaging. No MCA branch occlusion identified. No significant retrograde filling of the ICA terminus. Left ACA: A 1 segment patent. A 2 segment perfuses  the right territory. Completion Findings: Initial TICI of the right carotid siphon: 0 Final TICI of the right carotid siphon: 0 Flat panel CT reveals no intracranial hemorrhage PROCEDURE: The anesthesia team was present to provide general endotracheal tube  anesthesia and for patient monitoring during the procedure. Intubation was performed in neuro angio biplane room. Interventional neuro radiology nursing staff was also present. Ultrasound survey of the right inguinal region was performed with images stored and sent to PACs. 11 blade scalpel was used to make a small incision. Blunt dissection was performed with US guidance. A micropuncture needle was used access the right common femoral artery under ultrasound. With excellent arterial blood flow returned, an .018 micro wire was passed through the needle, observed to enter the abdominal aorta under fluoroscopy. The needle was removed, and a micropuncture sheath was placed over the wire. The inner dilator and wire were removed, and an 035 wire was advanced under fluoroscopy into the abdominal aorta. A standard 5 French sheath was placed, aspirated and flushed. JB 1 catheter was then advanced on the diagnostic wire, advanced to the proximal descending thoracic aorta. Wire was removed and a double flush was performed. A complete cerebral/cervical angiogram was then performed using standard technique with the assistance of a Glidewire, for selection of the right vertebral artery, right common carotid artery/right cervical ICA, left carotid artery, left vertebral artery. The JB 1 catheter was then withdrawn into the proximal descending thoracic aorta, flushed, with flow switch applied. Images were then reviewed with neurology team. After review of the images we elected to proceed with attempted aspiration at the site of occlusion. JB 1 catheter was navigated on the Glidewire into the distal right cervical ICA. Glidewire was removed and a rose in wire was placed into the  cervical ICA. JB 1 was removed. The 5 French sheath was removed and a 25cm 66F straight vascular sheath was placed. The dilator was removed and the sheath was flushed. Sheath was attached to pressurized and heparinized saline bag for constant forward flow. A 95cm 087 "Walrus" balloon guide with coaxial 125cm Berenstein diagnostic catheter was advanced over the Rose an wire to the distal cervical ICA. Double flush of the balloon guide was performed, and attached to rotating hemostatic valve and a pressure bag. Angiogram was performed. Road map function was used once the occluded vessel was identified. A coaxial system was then advanced through the balloon catheter, which included the selected intermediate catheter, microcatheter, and microwire. In this scenario, the set up included a zoom 71 aspiration catheter, a Trevo Trak microcatheter, and 014 synchro soft wire. This system was advanced through the balloon guide catheter under the road-map function, with adequate back-flush at the rotating hemostatic valve at that back end of the balloon guide. Coaxial system was gently advanced through the cavernous segment, to the level of the ophthalmic artery origin. The microwire and microcatheter were removed when the tip of the aspiration catheter was just proximal to the site of occlusion. Constant aspiration was then initiated using the proprietary engine at the hub of the zoom aspiration catheter. We confirmed continuous return of blood, and then advanced the aspiration catheter gently into the occlusion site beyond the ophthalmic artery origin. The intention was to gently advanced the catheter, and avoid advancing the catheter into the ICA terminus/proximal MCA, given the uncertainty of the source of occlusion (embolus versus dissection). Once we confirmed cessation of return of blood flow as we advanced the catheter, we observed a 30 second time interval with forward pressure on the aspiration catheter, and then  withdrew the aspiration catheter through the cavernous segment and into the balloon guide. Free aspiration of blood was observed at the hub of the aspiration catheter. Repeat angiogram was performed  demonstrating that the occlusion persisted. 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 Catheter and balloon guide were removed. The skin at the puncture site was then cleaned with Chlorhexidine. The 8 French sheath was removed and an 45F angioseal was deployed. Flat panel CT was performed. Patient tolerated the procedure well and remained hemodynamically stable throughout. No complications were encountered and no significant blood loss encountered. 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 Angio-Seal deployed for hemostasis. Cervical angiogram reveals decreased caliber of the right cervical ICA, without definitive dissection flap. Cerebral angiogram confirms adequate left to right perfusion of the right-sided MCA territory branches with no MCA branch occlusion. No proximal occlusion identified within the right PCA Signed, Dulcy Fanny. Dellia Nims, [facility] Radiology PLAN: The patient will remain intubated. ICU status Urgent MRI/MRA, as diagnostic tool for right carotid siphon occlusion, to determine extent of infarction, as well as if there is obvious dissection flap versus thromboembolic disease as the etiology of occlusion. Target systolic blood pressure of less than 539 systolic Right hip straight time  4 hours Frequent neurovascular checks Electronically Signed   By: Corrie Mckusick D.O.   On: 02/15/2021 10:01   CT C-SPINE NO CHARGE  Result Date: 02/14/2021 CLINICAL DATA:  Fall EXAM: CT CERVICAL SPINE WITHOUT CONTRAST TECHNIQUE: Multidetector CT imaging of the cervical spine was performed without intravenous contrast. Multiplanar CT image reconstructions were also generated. COMPARISON:  None. FINDINGS: Alignment: No static subluxation. Facets are aligned. Occipital condyles and the lateral masses of C1 and C2 are normally approximated. Skull base and vertebrae: No acute fracture. Soft tissues and spinal canal: No prevertebral fluid or swelling. No visible canal hematoma. Disc levels: No advanced spinal canal or neural foraminal stenosis. Upper chest: No pneumothorax, pulmonary nodule or pleural effusion. Other: Normal visualized paraspinal cervical soft tissues. IMPRESSION: No acute fracture or static subluxation of the cervical spine. Electronically Signed   By: Ulyses Jarred M.D.   On: 02/14/2021 22:50   Portable Chest x-ray  Result Date: 02/15/2021 CLINICAL DATA:  Intubation EXAM: PORTABLE CHEST 1 VIEW COMPARISON:  None. FINDINGS: Endotracheal tube with tip at the clavicular heads. Low volume chest with hazy and streaky density suggesting atelectasis. Accentuated heart size by technique. No effusion or pneumothorax. IMPRESSION: 1. Low volume chest with atelectasis. 2. Unremarkable endotracheal tube positioning. Electronically Signed   By: Jorje Guild M.D.   On: 02/15/2021 04:04   ECHOCARDIOGRAM COMPLETE  Result Date: 02/15/2021    ECHOCARDIOGRAM REPORT   Patient Name:   MARCK MCCLENNY Date of Exam: 02/15/2021 Medical Rec #:  [id number]    Height:       68.0 in Accession #:    [id number]   Weight:       230.0 lb Date of Birth:  1985-09-13    BSA:          2.169 m Patient Age:    35 years     BP:           125/82 mmHg Patient Gender: M            HR:  69 bpm. Exam Location:  Inpatient  Procedure: 2D Echo, Cardiac Doppler and Color Doppler Indications:    CVA  History:        Patient has no prior history of Echocardiogram examinations.                 CVA.  Sonographer:    MH Referring Phys: [id number] Cheriton  1. Left ventricular ejection fraction, by estimation, is 65 to 70%. The left ventricle has normal function. The left ventricle has no regional wall motion abnormalities. There is mild left ventricular hypertrophy. Left ventricular diastolic parameters were normal.  2. Right ventricular systolic function is normal. The right ventricular size is normal.  3. The mitral valve is normal in structure. Trivial mitral valve regurgitation.  4. The aortic valve is normal in structure. Aortic valve regurgitation is not visualized. FINDINGS  Left Ventricle: Left ventricular ejection fraction, by estimation, is 65 to 70%. The left ventricle has normal function. The left ventricle has no regional wall motion abnormalities. The left ventricular internal cavity size was normal in size. There is  mild left ventricular hypertrophy. Left ventricular diastolic parameters were normal. Right Ventricle: The right ventricular size is normal. Right vetricular wall thickness was not assessed. Right ventricular systolic function is normal. Left Atrium: Left atrial size was normal in size. Right Atrium: Right atrial size was normal in size. Pericardium: There is no evidence of pericardial effusion. Mitral Valve: The mitral valve is normal in structure. Trivial mitral valve regurgitation. Tricuspid Valve: The tricuspid valve is normal in structure. Tricuspid valve regurgitation is trivial. Aortic Valve: The aortic valve is normal in structure. Aortic valve regurgitation is not visualized. Aortic valve mean gradient measures 3.0 mmHg. Aortic valve peak gradient measures 5.1 mmHg. Aortic valve area, by VTI measures 2.51 cm. Pulmonic Valve: The pulmonic valve was grossly normal. Pulmonic valve  regurgitation is not visualized. Aorta: The aortic root is normal in size and structure. IAS/Shunts: No atrial level shunt detected by color flow Doppler.  LEFT VENTRICLE PLAX 2D LVIDd:         4.50 cm     Diastology LVIDs:         2.90 cm     LV e' medial:    11.70 cm/s LV PW:         1.10 cm     LV E/e' medial:  7.7 LV IVS:        1.40 cm     LV e' lateral:   10.80 cm/s LVOT diam:     1.90 cm     LV E/e' lateral: 8.4 LV SV:         59 LV SV Index:   27 LVOT Area:     2.84 cm  LV Volumes (MOD) LV vol d, MOD A4C: 55.0 ml LV vol s, MOD A4C: 18.3 ml LV SV MOD A4C:     55.0 ml RIGHT VENTRICLE TAPSE (M-mode): 2.5 cm LEFT ATRIUM             Index       RIGHT ATRIUM           Index LA diam:        2.60 cm 1.20 cm/m  RA Area:     10.70 cm LA Vol (A2C):   37.7 ml 17.38 ml/m RA Volume:   18.70 ml  8.62 ml/m LA Vol (A4C):   31.7 ml 14.62 ml/m LA Biplane Vol: 34.2 ml 15.77 ml/m  AORTIC  VALVE                   PULMONIC VALVE AV Area (Vmax):    2.56 cm    PV Vmax:       1.09 m/s AV Area (Vmean):   2.51 cm    PV Peak grad:  4.8 mmHg AV Area (VTI):     2.51 cm AV Vmax:           113.00 cm/s AV Vmean:          73.500 cm/s AV VTI:            0.234 m AV Peak Grad:      5.1 mmHg AV Mean Grad:      3.0 mmHg LVOT Vmax:         102.00 cm/s LVOT Vmean:        65.000 cm/s LVOT VTI:          0.207 m LVOT/AV VTI ratio: 0.88  AORTA Ao Root diam: 3.40 cm Ao Asc diam:  3.40 cm MITRAL VALVE MV Area (PHT): 3.86 cm    SHUNTS MV E velocity: 90.30 cm/s  Systemic VTI:  0.21 m MV A velocity: 50.30 cm/s  Systemic Diam: 1.90 cm MV E/A ratio:  1.80 Dorris Carnes MD Electronically signed by Dorris Carnes MD Signature Date/Time: 02/15/2021/2:16:26 PM    Final    IR PERCUTANEOUS ART THROMBECTOMY/INFUSION INTRACRANIAL INC DIAG ANGIO  Result Date: 02/15/2021 INDICATION: 35 year old male with a history of acute left-sided weakness, ICA occlusion, possible dissection EXAM: ULTRASOUND-GUIDED RIGHT COMMON FEMORAL ARTERY ACCESS FOUR-VESSEL  CERVICAL/CEREBRAL ANGIOGRAM ATTEMPTED MECHANICAL THROMBECTOMY RIGHT INTRACRANIAL ICA ANGIO-SEAL FOR HEMOSTASIS COMPARISON:  CT same day MEDICATIONS: None ANESTHESIA/SEDATION: The anesthesia team was present to provide general endotracheal tube anesthesia and for patient monitoring during the procedure. Intubation was performed in biplane room 2. left radial arterial line was performed by the anesthesia team. Interventional neuro radiology nursing staff was also present. CONTRAST:  100 cc FLUOROSCOPY TIME:  Fluoroscopy Time: 13 minutes 30 seconds (2,922 mGy). COMPLICATIONS: None TECHNIQUE: Informed written consent was obtained from the patient's family after a thorough discussion of the procedural risks, benefits and alternatives. Specific risks discussed include: Bleeding, infection, contrast reaction, kidney injury/failure, need for further procedure/surgery, arterial injury or dissection, embolization to new territory, intracranial hemorrhage (10-15% risk), neurologic deterioration, cardiopulmonary collapse, death. All questions were addressed. Maximal Sterile Barrier Technique was utilized including during the procedure including caps, mask, sterile gowns, sterile gloves, sterile drape, hand hygiene and skin antiseptic. A timeout was performed prior to the initiation of the procedure. The anesthesia team was present to provide general endotracheal tube anesthesia and for patient monitoring during the procedure. Interventional neuro radiology nursing staff was also present. FINDINGS: Initial Findings: Ultrasound of the right inguinal region demonstrates patent right common femoral artery, without significant atherosclerotic plaque. No significant tortuosity of the branch vessels. Right vertebral artery: Normal course caliber and contour of the cervical segment of the right vertebral artery, with patent cervical branches. Anterior cerebral artery visualized with contribution from the lower cervical segment  reticular medullary branches as well as the intracranial right vertebral artery. Mild tortuosity of the right V4 segment. No significant atherosclerotic changes of the basilar artery. Slip stream artifact decreases the resolution of the basilar artery. Proximal right and left PCA are patent. There is decreased capillary/parenchymal perfusion on the lateral view within the cuneus. Right common carotid artery:  Normal course caliber and contour. Right external carotid artery: Patent with  antegrade flow. Right internal carotid artery: Decreased caliber in the contrast column of the right cervical ICA, with slow filling throughout the length of the right cervical ICA to the skull base. The contrast column terminates into the ophthalmic artery, with occlusion just beyond the ophthalmic artery. There is no spiraling filling defect that would confirm a dissection flap. Right MCA: No filling of the right MCA branches, secondary to occlusion in the supra ophthalmic segment. Right ACA: No filling of the ACA branches, given the occlusion in the siphon. Left vertebral artery: Left PCA: Left common carotid artery:  Normal course caliber and contour. Left external carotid artery: Patent with antegrade flow. Left internal carotid artery: Normal course caliber and contour of the cervical portion. Vertical and petrous segment patent with normal course caliber contour. Cavernous segment patent. Clinoid segment patent. Antegrade flow of the ophthalmic artery. Ophthalmic segment patent. Terminus patent. Left MCA: M1 segment patent. Insular and opercular segments patent. Unremarkable caliber and course of the cortical segments. Typical arterial, capillary/ parenchymal, and venous phase. There is rapid left-to-right filling across a patent anterior communicating artery, with complete filling of the right anterior cerebral artery territory and the right MCA cerebral territory. There is slight delay throughout all phases of the early, late  arterial phase, capillary phase, and parenchymal phase as well as the venous phase compared to the left secondary to the cross fill. The watershed of the Thomas and MCA territory is filling, though with delay that corresponds to preoperative CT perfusion imaging. No MCA branch occlusion identified. No significant retrograde filling of the ICA terminus. Left ACA: A 1 segment patent. A 2 segment perfuses the right territory. Completion Findings: Initial TICI of the right carotid siphon: 0 Final TICI of the right carotid siphon: 0 Flat panel CT reveals no intracranial hemorrhage PROCEDURE: The anesthesia team was present to provide general endotracheal tube anesthesia and for patient monitoring during the procedure. Intubation was performed in neuro angio biplane room. Interventional neuro radiology nursing staff was also present. Ultrasound survey of the right inguinal region was performed with images stored and sent to PACs. 11 blade scalpel was used to make a small incision. Blunt dissection was performed with US guidance. A micropuncture needle was used access the right common femoral artery under ultrasound. With excellent arterial blood flow returned, an .018 micro wire was passed through the needle, observed to enter the abdominal aorta under fluoroscopy. The needle was removed, and a micropuncture sheath was placed over the wire. The inner dilator and wire were removed, and an 035 wire was advanced under fluoroscopy into the abdominal aorta. A standard 5 French sheath was placed, aspirated and flushed. JB 1 catheter was then advanced on the diagnostic wire, advanced to the proximal descending thoracic aorta. Wire was removed and a double flush was performed. A complete cerebral/cervical angiogram was then performed using standard technique with the assistance of a Glidewire, for selection of the right vertebral artery, right common carotid artery/right cervical ICA, left carotid artery, left vertebral artery. The  JB 1 catheter was then withdrawn into the proximal descending thoracic aorta, flushed, with flow switch applied. Images were then reviewed with neurology team. After review of the images we elected to proceed with attempted aspiration at the site of occlusion. JB 1 catheter was navigated on the Glidewire into the distal right cervical ICA. Glidewire was removed and a rose in wire was placed into the cervical ICA. JB 1 was removed. The 5 French sheath was removed and a  25cm 40F straight vascular sheath was placed. The dilator was removed and the sheath was flushed. Sheath was attached to pressurized and heparinized saline bag for constant forward flow. A 95cm 087 "Walrus" balloon guide with coaxial 125cm Berenstein diagnostic catheter was advanced over the Rose an wire to the distal cervical ICA. Double flush of the balloon guide was performed, and attached to rotating hemostatic valve and a pressure bag. Angiogram was performed. Road map function was used once the occluded vessel was identified. A coaxial system was then advanced through the balloon catheter, which included the selected intermediate catheter, microcatheter, and microwire. In this scenario, the set up included a zoom 71 aspiration catheter, a Trevo Trak microcatheter, and 014 synchro soft wire. This system was advanced through the balloon guide catheter under the road-map function, with adequate back-flush at the rotating hemostatic valve at that back end of the balloon guide. Coaxial system was gently advanced through the cavernous segment, to the level of the ophthalmic artery origin. The microwire and microcatheter were removed when the tip of the aspiration catheter was just proximal to the site of occlusion. Constant aspiration was then initiated using the proprietary engine at the hub of the zoom aspiration catheter. We confirmed continuous return of blood, and then advanced the aspiration catheter gently into the occlusion site beyond the  ophthalmic artery origin. The intention was to gently advanced the catheter, and avoid advancing the catheter into the ICA terminus/proximal MCA, given the uncertainty of the source of occlusion (embolus versus dissection). Once we confirmed cessation of return of blood flow as we advanced the catheter, we observed a 30 second time interval with forward pressure on the aspiration catheter, and then withdrew the aspiration catheter through the cavernous segment and into the balloon guide. Free aspiration of blood was observed at the hub of the aspiration catheter. Repeat angiogram was performed demonstrating that the occlusion persisted. 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 Catheter and balloon guide were removed. The skin at the puncture site was then cleaned with Chlorhexidine. The 8 French sheath was removed and an 40F angioseal was deployed. Flat panel CT was performed. Patient tolerated the procedure well and remained hemodynamically stable throughout. No complications were encountered and no significant blood loss encountered. 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 Angio-Seal deployed for hemostasis. Cervical angiogram reveals decreased caliber of the right cervical ICA, without definitive dissection flap. Cerebral angiogram confirms adequate left to right perfusion of the right-sided MCA territory branches with no MCA branch occlusion. No proximal occlusion identified within the right PCA Signed, Dulcy Fanny. Dellia Nims, [facility] Radiology PLAN: The patient will remain intubated. ICU status Urgent MRI/MRA, as diagnostic tool for right carotid siphon occlusion, to determine extent of infarction, as well as if there is obvious dissection flap versus thromboembolic disease as the etiology of occlusion. Target systolic blood pressure of less than 277 systolic Right hip straight time 4 hours Frequent neurovascular checks Electronically Signed   By: Corrie Mckusick D.O.   On: 02/15/2021 10:01   CT HEAD CODE STROKE WO CONTRAST  Result Date:  02/14/2021 CLINICAL DATA:  Code stroke.  Left arm weakness and facial droop EXAM: CT HEAD WITHOUT CONTRAST TECHNIQUE: Contiguous axial images were obtained from the base of the skull through the vertex without intravenous contrast. COMPARISON:  None. FINDINGS: Brain: There is no mass, hemorrhage or extra-axial collection. The size and configuration of the ventricles and extra-axial CSF spaces are normal. The brain parenchyma is normal, without evidence of acute or chronic infarction. Vascular: No abnormal hyperdensity of the major intracranial arteries or dural venous sinuses. No intracranial atherosclerosis. Skull: The visualized skull base, calvarium and extracranial soft tissues are normal. Sinuses/Orbits: No fluid levels or advanced mucosal thickening of the visualized paranasal sinuses. No mastoid or middle ear effusion. The orbits are normal. ASPECTS [facility] Stroke Program Early CT Score) - Ganglionic level infarction (caudate, lentiform nuclei, internal capsule, insula, M1-M3 cortex): 7 - Supraganglionic infarction (M4-M6 cortex): 3 Total score (0-10 with 10 being normal): 10 IMPRESSION: 1. Normal head CT. 2. ASPECTS is 10. These results were communicated to Dr. Lesleigh Noe at 10:04 pm on 02/14/2021 by text page via the [facility] messaging system. Electronically Signed   By: Ulyses Jarred M.D.   On: 02/14/2021 22:04   CT ANGIO HEAD NECK W WO CM W PERF  (CODE STROKE)  Addendum Date: 02/14/2021   ADDENDUM REPORT: 02/14/2021 23:22 ADDENDUM: On further review, there is an area of occlusion of the proximal right posterior communicating artery and the proximal P2 segment of the right posterior cerebral artery. This is likely an extension of the process affecting the distal right internal carotid artery. These results were called by telephone at the time of interpretation on 02/14/2021 at 11:21 pm to provider [facility] , who verbally acknowledged these results. Electronically Signed   By: Ulyses Jarred M.D.   On: 02/14/2021 23:22   Result Date: 02/14/2021 CLINICAL DATA:  Left arm weakness and facial droop EXAM: CT ANGIOGRAPHY HEAD AND NECK CT PERFUSION BRAIN TECHNIQUE: Multidetector CT imaging of the head and neck was performed using the standard protocol during bolus administration of intravenous contrast. Multiplanar CT image reconstructions and MIPs were obtained to evaluate the vascular anatomy. Carotid stenosis measurements (when applicable) are obtained utilizing NASCET criteria, using the distal internal carotid diameter as the denominator. Multiphase CT imaging of the brain was performed following IV bolus contrast injection. Subsequent parametric perfusion maps were calculated using RAPID software. CONTRAST:  134m OMNIPAQUE IOHEXOL 350 MG/ML SOLN COMPARISON:  None. FINDINGS: CTA NECK FINDINGS SKELETON: There is no bony spinal canal stenosis. No lytic or blastic lesion. OTHER NECK: Normal pharynx, larynx and major salivary glands. No cervical lymphadenopathy. Unremarkable thyroid gland. UPPER CHEST: No pneumothorax or pleural effusion. No nodules or masses. AORTIC ARCH: There is no calcific atherosclerosis of the aortic arch. There is no aneurysm, dissection or hemodynamically significant stenosis of the visualized portion of the aorta. Conventional 3 vessel aortic branching pattern. The visualized proximal subclavian arteries are widely patent. RIGHT  CAROTID SYSTEM: There is tapering and hypoenhancement of the distal right internal carotid artery. LEFT CAROTID SYSTEM: Normal without aneurysm, dissection or stenosis. VERTEBRAL ARTERIES: Left dominant configuration. Both origins are clearly patent. There is no dissection, occlusion or flow-limiting stenosis to the skull base (V1-V3 segments). CTA HEAD FINDINGS POSTERIOR CIRCULATION: --Vertebral arteries: Normal V4 segments. --Inferior cerebellar arteries: Normal. --Basilar artery: Normal. --Superior cerebellar arteries: Normal. --Posterior cerebral arteries (PCA): Normal. ANTERIOR CIRCULATION: --Intracranial internal carotid arteries: The right internal carotid artery is occluded at the distal cavernous segment. It remains occluded to  the carotid terminus. The right middle and anterior cerebral arteries fill via flow across the anterior communicating artery. --Anterior cerebral arteries (ACA): Normal. Both A1 segments are present. Patent anterior communicating artery (a-comm). --Middle cerebral arteries (MCA): Normal. VENOUS SINUSES: As permitted by contrast timing, patent. ANATOMIC VARIANTS: None Review of the MIP images confirms the above findings. CT Brain Perfusion Findings: ASPECTS: 10 CBF (<30%) Volume: 66m Perfusion (Tmax>6.0s) volume: 1259mMismatch Volume: 1205mnfarction Location:No core infarct. Large area of ischemic penumbra within the right hemisphere in a watershed/hypoperfusion type distribution. IMPRESSION: 1. 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 The right middle and anterior cerebral arteries fill via flow across the anterior communicating artery. 2. Large area of ischemic penumbra within the right hemisphere in a watershed/hypoperfusion type distribution. No core infarct. Critical Value/emergent results were called by telephone at the time of interpretation on  02/14/2021 at 10:35 pm [facility]. Electronically Signed: By: KevUlyses JarredD. On: 02/14/2021 22:36   IR ANGIO INTRA EXTRACRAN SEL COM CAROTID INNOMINATE UNI L MOD SED  Result Date: 02/15/2021 INDICATION: [age]ar old male with a history of acute left-sided weakness, ICA occlusion, possible dissection EXAM: ULTRASOUND-GUIDED RIGHT COMMON FEMORAL ARTERY ACCESS FOUR-VESSEL CERVICAL/CEREBRAL ANGIOGRAM ATTEMPTED MECHANICAL THROMBECTOMY RIGHT INTRACRANIAL ICA ANGIO-SEAL FOR HEMOSTASIS COMPARISON:  CT same day MEDICATIONS: None ANESTHESIA/SEDATION: The anesthesia team was present to provide general endotracheal tube anesthesia and for patient monitoring during the procedure. Intubation was performed in biplane room 2. left radial arterial line was performed by the anesthesia team. Interventional neuro radiology nursing staff was also present. CONTRAST:  100 cc FLUOROSCOPY TIME:  Fluoroscopy Time: 13 minutes 30 seconds (2,922 mGy). COMPLICATIONS: None TECHNIQUE: Informed written consent was obtained from the patient's family after a thorough discussion of the procedural risks, benefits and alternatives. Specific risks discussed include: Bleeding, infection, contrast reaction, kidney injury/failure, need for further procedure/surgery, arterial injury or dissection, embolization to new territory, intracranial hemorrhage (10-15% risk), neurologic deterioration, cardiopulmonary collapse, death. All questions were addressed. Maximal Sterile Barrier Technique was utilized including during the procedure including caps, mask, sterile gowns, sterile gloves, sterile drape, hand hygiene and skin antiseptic. A timeout was performed prior to the initiation of the procedure. The anesthesia team was present to provide general endotracheal tube anesthesia and for patient monitoring during the procedure. Interventional neuro radiology nursing staff was also present. FINDINGS:  Initial Findings: Ultrasound of the right inguinal region demonstrates patent right common femoral artery, without significant atherosclerotic plaque. No significant tortuosity of the branch vessels. Right vertebral artery: Normal course caliber and contour of the cervical segment of the right vertebral artery, with patent cervical branches. Anterior cerebral artery visualized with contribution from the lower cervical segment reticular medullary branches as well as the intracranial right vertebral artery. Mild tortuosity of the right V4 segment. No significant atherosclerotic changes of the basilar artery. Slip stream artifact decreases the resolution of the basilar artery. Proximal right and left PCA are patent. There is decreased capillary/parenchymal perfusion on the lateral view within the cuneus. Right common carotid artery:  Normal course caliber and contour. Right external carotid artery: Patent with antegrade flow. Right internal carotid artery: Decreased caliber in the contrast column of the right cervical ICA, with slow filling throughout the length of the right cervical ICA to the skull base. The contrast column terminates into the ophthalmic artery, with occlusion just beyond the  ophthalmic artery. There is no spiraling filling defect that would confirm a dissection flap. Right MCA: No filling of the right MCA branches, secondary to occlusion in the supra ophthalmic segment. Right ACA: No filling of the ACA branches, given the occlusion in the siphon. Left vertebral artery: Left PCA: Left common carotid artery:  Normal course caliber and contour. Left external carotid artery: Patent with antegrade flow. Left internal carotid artery: Normal course caliber and contour of the cervical portion. Vertical and petrous segment patent with normal course caliber contour. Cavernous segment patent. Clinoid segment patent. Antegrade flow of the ophthalmic artery. Ophthalmic segment patent. Terminus patent. Left MCA:  M1 segment patent. Insular and opercular segments patent. Unremarkable caliber and course of the cortical segments. Typical arterial, capillary/ parenchymal, and venous phase. There is rapid left-to-right filling across a patent anterior communicating artery, with complete filling of the right anterior cerebral artery territory and the right MCA cerebral territory. There is slight delay throughout all phases of the early, late arterial phase, capillary phase, and parenchymal phase as well as the venous phase compared to the left secondary to the cross fill. The watershed of the Rachel and MCA territory is filling, though with delay that corresponds to preoperative CT perfusion imaging. No MCA branch occlusion identified. No significant retrograde filling of the ICA terminus. Left ACA: A 1 segment patent. A 2 segment perfuses the right territory. Completion Findings: Initial TICI of the right carotid siphon: 0 Final TICI of the right carotid siphon: 0 Flat panel CT reveals no intracranial hemorrhage PROCEDURE: The anesthesia team was present to provide general endotracheal tube anesthesia and for patient monitoring during the procedure. Intubation was performed in neuro angio biplane room. Interventional neuro radiology nursing staff was also present. Ultrasound survey of the right inguinal region was performed with images stored and sent to PACs. 11 blade scalpel was used to make a small incision. Blunt dissection was performed with US guidance. A micropuncture needle was used access the right common femoral artery under ultrasound. With excellent arterial blood flow returned, an .018 micro wire was passed through the needle, observed to enter the abdominal aorta under fluoroscopy. The needle was removed, and a micropuncture sheath was placed over the wire. The inner dilator and wire were removed, and an 035 wire was advanced under fluoroscopy into the abdominal aorta. A standard 5 French sheath was placed, aspirated  and flushed. JB 1 catheter was then advanced on the diagnostic wire, advanced to the proximal descending thoracic aorta. Wire was removed and a double flush was performed. A complete cerebral/cervical angiogram was then performed using standard technique with the assistance of a Glidewire, for selection of the right vertebral artery, right common carotid artery/right cervical ICA, left carotid artery, left vertebral artery. The JB 1 catheter was then withdrawn into the proximal descending thoracic aorta, flushed, with flow switch applied. Images were then reviewed with neurology team. After review of the images we elected to proceed with attempted aspiration at the site of occlusion. JB 1 catheter was navigated on the Glidewire into the distal right cervical ICA. Glidewire was removed and a rose in wire was placed into the cervical ICA. JB 1 was removed. The 5 French sheath was removed and a 25cm 1F straight vascular sheath was placed. The dilator was removed and the sheath was flushed. Sheath was attached to pressurized and heparinized saline bag for constant forward flow. A 95cm 087 "Walrus" balloon guide with coaxial 125cm Berenstein diagnostic catheter was advanced over the  Rose an wire to the distal cervical ICA. Double flush of the balloon guide was performed, and attached to rotating hemostatic valve and a pressure bag. Angiogram was performed. Road map function was used once the occluded vessel was identified. A coaxial system was then advanced through the balloon catheter, which included the selected intermediate catheter, microcatheter, and microwire. In this scenario, the set up included a zoom 71 aspiration catheter, a Trevo Trak microcatheter, and 014 synchro soft wire. This system was advanced through the balloon guide catheter under the road-map function, with adequate back-flush at the rotating hemostatic valve at that back end of the balloon guide. Coaxial system was gently advanced through the  cavernous segment, to the level of the ophthalmic artery origin. The microwire and microcatheter were removed when the tip of the aspiration catheter was just proximal to the site of occlusion. Constant aspiration was then initiated using the proprietary engine at the hub of the zoom aspiration catheter. We confirmed continuous return of blood, and then advanced the aspiration catheter gently into the occlusion site beyond the ophthalmic artery origin. The intention was to gently advanced the catheter, and avoid advancing the catheter into the ICA terminus/proximal MCA, given the uncertainty of the source of occlusion (embolus versus dissection). Once we confirmed cessation of return of blood flow as we advanced the catheter, we observed a 30 second time interval with forward pressure on the aspiration catheter, and then withdrew the aspiration catheter through the cavernous segment and into the balloon guide. Free aspiration of blood was observed at the hub of the aspiration catheter. Repeat angiogram was performed demonstrating that the occlusion persisted. 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 Catheter and balloon guide were removed. The skin at the puncture site was then cleaned with Chlorhexidine. The 8 French sheath was removed and an 35F angioseal was deployed. Flat panel CT was performed. Patient tolerated the procedure well and remained hemodynamically stable throughout. No complications were encountered and no significant blood loss encountered. IMPRESSION: Status post ultrasound guided access right common femoral artery for complete four-vessel angiogram, with attempted mechanical thrombectomy of right carotid siphon occlusion, without  restoration of flow beyond the ophthalmic artery. Angio-Seal deployed for hemostasis. Cervical angiogram reveals decreased caliber of the right cervical ICA, without definitive dissection flap. Cerebral angiogram confirms adequate left to right perfusion of the right-sided MCA territory branches with no MCA branch occlusion. No proximal occlusion identified within the right PCA Signed, Dulcy Fanny. Dellia Nims, [facility] Radiology PLAN: The patient will remain intubated. ICU status Urgent MRI/MRA, as diagnostic tool for right carotid siphon occlusion, to determine extent of infarction, as well as if there is obvious dissection flap versus thromboembolic disease as the etiology of occlusion. Target systolic blood pressure of less than 811 systolic Right hip straight time 4 hours Frequent neurovascular checks Electronically Signed   By: Corrie Mckusick D.O.   On: 02/15/2021 10:01   IR ANGIO VERTEBRAL SEL VERTEBRAL BILAT MOD SED  Result Date: 02/15/2021 INDICATION: 35 year old male with a history of acute left-sided weakness, ICA occlusion, possible dissection EXAM: ULTRASOUND-GUIDED RIGHT COMMON FEMORAL ARTERY ACCESS FOUR-VESSEL CERVICAL/CEREBRAL ANGIOGRAM ATTEMPTED MECHANICAL THROMBECTOMY RIGHT INTRACRANIAL ICA ANGIO-SEAL FOR HEMOSTASIS COMPARISON:  CT same day MEDICATIONS: None ANESTHESIA/SEDATION: The  anesthesia team was present to provide general endotracheal tube anesthesia and for patient monitoring during the procedure. Intubation was performed in biplane room 2. left radial arterial line was performed by the anesthesia team. Interventional neuro radiology nursing staff was also present. CONTRAST:  100 cc FLUOROSCOPY TIME:  Fluoroscopy Time: 13 minutes 30 seconds (2,922 mGy). COMPLICATIONS: None TECHNIQUE: Informed written consent was obtained from the patient's family after a thorough discussion of the procedural risks, benefits and alternatives. Specific  risks discussed include: Bleeding, infection, contrast reaction, kidney injury/failure, need for further procedure/surgery, arterial injury or dissection, embolization to new territory, intracranial hemorrhage (10-15% risk), neurologic deterioration, cardiopulmonary collapse, death. All questions were addressed. Maximal Sterile Barrier Technique was utilized including during the procedure including caps, mask, sterile gowns, sterile gloves, sterile drape, hand hygiene and skin antiseptic. A timeout was performed prior to the initiation of the procedure. The anesthesia team was present to provide general endotracheal tube anesthesia and for patient monitoring during the procedure. Interventional neuro radiology nursing staff was also present. FINDINGS: Initial Findings: Ultrasound of the right inguinal region demonstrates patent right common femoral artery, without significant atherosclerotic plaque. No significant tortuosity of the branch vessels. Right vertebral artery: Normal course caliber and contour of the cervical segment of the right vertebral artery, with patent cervical branches. Anterior cerebral artery visualized with contribution from the lower cervical segment reticular medullary branches as well as the intracranial right vertebral artery. Mild tortuosity of the right V4 segment. No significant atherosclerotic changes of the basilar artery. Slip stream artifact decreases the resolution of the basilar artery. Proximal right and left PCA are patent. There is decreased capillary/parenchymal perfusion on the lateral view within the cuneus. Right common carotid artery:  Normal course caliber and contour. Right external carotid artery: Patent with antegrade flow. Right internal carotid artery: Decreased caliber in the contrast column of the right cervical ICA, with slow filling throughout the length of the right cervical ICA to the skull base. The contrast column terminates into the ophthalmic artery, with  occlusion just beyond the ophthalmic artery. There is no spiraling filling defect that would confirm a dissection flap. Right MCA: No filling of the right MCA branches, secondary to occlusion in the supra ophthalmic segment. Right ACA: No filling of the ACA branches, given the occlusion in the siphon. Left vertebral artery: Left PCA: Left common carotid artery:  Normal course caliber and contour. Left external carotid artery: Patent with antegrade flow. Left internal carotid artery: Normal course caliber and contour of the cervical portion. Vertical and petrous segment patent with normal course caliber contour. Cavernous segment patent. Clinoid segment patent. Antegrade flow of the ophthalmic artery. Ophthalmic segment patent. Terminus patent. Left MCA: M1 segment patent. Insular and opercular segments patent. Unremarkable caliber and course of the cortical segments. Typical arterial, capillary/ parenchymal, and venous phase. There is rapid left-to-right filling across a patent anterior communicating artery, with complete filling of the right anterior cerebral artery territory and the right MCA cerebral territory. There is slight delay throughout all phases of the early, late arterial phase, capillary phase, and parenchymal phase as well as the venous phase compared to the left secondary to the cross fill. The watershed of the [place] and MCA territory is filling, though with delay that corresponds to preoperative CT perfusion imaging. No MCA branch occlusion identified. No significant retrograde filling of the ICA terminus. Left ACA: A 1 segment patent. A 2 segment perfuses the right territory. Completion Findings: Initial TICI of the right carotid siphon:  0 Final TICI of the right carotid siphon: 0 Flat panel CT reveals no intracranial hemorrhage PROCEDURE: The anesthesia team was present to provide general endotracheal tube anesthesia and for patient monitoring during the procedure. Intubation was performed in neuro  angio biplane room. Interventional neuro radiology nursing staff was also present. Ultrasound survey of the right inguinal region was performed with images stored and sent to PACs. 11 blade scalpel was used to make a small incision. Blunt dissection was performed with US guidance. A micropuncture needle was used access the right common femoral artery under ultrasound. With excellent arterial blood flow returned, an .018 micro wire was passed through the needle, observed to enter the abdominal aorta under fluoroscopy. The needle was removed, and a micropuncture sheath was placed over the wire. The inner dilator and wire were removed, and an 035 wire was advanced under fluoroscopy into the abdominal aorta. A standard 5 French sheath was placed, aspirated and flushed. JB 1 catheter was then advanced on the diagnostic wire, advanced to the proximal descending thoracic aorta. Wire was removed and a double flush was performed. A complete cerebral/cervical angiogram was then performed using standard technique with the assistance of a Glidewire, for selection of the right vertebral artery, right common carotid artery/right cervical ICA, left carotid artery, left vertebral artery. The JB 1 catheter was then withdrawn into the proximal descending thoracic aorta, flushed, with flow switch applied. Images were then reviewed with neurology team. After review of the images we elected to proceed with attempted aspiration at the site of occlusion. JB 1 catheter was navigated on the Glidewire into the distal right cervical ICA. Glidewire was removed and a rose in wire was placed into the cervical ICA. JB 1 was removed. The 5 French sheath was removed and a 25cm 58F straight vascular sheath was placed. The dilator was removed and the sheath was flushed. Sheath was attached to pressurized and heparinized saline bag for constant forward flow. A 95cm 087 "Walrus" balloon guide with coaxial 125cm Berenstein diagnostic catheter was  advanced over the Rose an wire to the distal cervical ICA. Double flush of the balloon guide was performed, and attached to rotating hemostatic valve and a pressure bag. Angiogram was performed. Road map function was used once the occluded vessel was identified. A coaxial system was then advanced through the balloon catheter, which included the selected intermediate catheter, microcatheter, and microwire. In this scenario, the set up included a zoom 71 aspiration catheter, a Trevo Trak microcatheter, and 014 synchro soft wire. This system was advanced through the balloon guide catheter under the road-map function, with adequate back-flush at the rotating hemostatic valve at that back end of the balloon guide. Coaxial system was gently advanced through the cavernous segment, to the level of the ophthalmic artery origin. The microwire and microcatheter were removed when the tip of the aspiration catheter was just proximal to the site of occlusion. Constant aspiration was then initiated using the proprietary engine at the hub of the zoom aspiration catheter. We confirmed continuous return of blood, and then advanced the aspiration catheter gently into the occlusion site beyond the ophthalmic artery origin. The intention was to gently advanced the catheter, and avoid advancing the catheter into the ICA terminus/proximal MCA, given the uncertainty of the source of occlusion (embolus versus dissection). Once we confirmed cessation of return of blood flow as we advanced the catheter, we observed a 30 second time interval with forward pressure on the aspiration catheter, and then withdrew the aspiration  catheter through the cavernous segment and into the balloon guide. Free aspiration of blood was observed at the hub of the aspiration catheter. Repeat angiogram was performed demonstrating that the occlusion persisted. Given the working diagnosis of a carotid dissection as the source of the occlusion, based on preoperative  imaging, we elected to terminate the procedure at this time as we confirmed adequate left-to-right perfusion of the MCA territory, with no MCA occlusion identified on the initial cervical/cerebral angiogram. Catheter and balloon guide were removed. The skin at the puncture site was then cleaned with Chlorhexidine. The 8 French sheath was removed and an 78F angioseal was deployed. Flat panel CT was performed. Patient tolerated the procedure well and remained hemodynamically stable throughout. No complications were encountered and no significant blood loss encountered. 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 Angio-Seal deployed for hemostasis. Cervical angiogram reveals decreased caliber of the right cervical ICA, without definitive dissection flap. Cerebral angiogram confirms adequate left to right perfusion of the right-sided MCA territory branches with no MCA branch occlusion. No proximal occlusion identified within the right PCA Signed, Dulcy Fanny. Dellia Nims, [facility] Radiology PLAN: The patient will remain intubated. ICU status Urgent MRI/MRA, as diagnostic tool for right carotid siphon occlusion, to determine extent of infarction, as well as if there is obvious dissection flap versus thromboembolic disease as the etiology of occlusion. Target systolic blood pressure of less than 417 systolic Right hip straight time 4 hours Frequent neurovascular checks Electronically Signed   By: Corrie Mckusick D.O.   On: 02/15/2021 10:01    PHYSICAL EXAM Pleasant young Caucasian male not in distress.  He has a neck collar. . Afebrile. Head is nontraumatic. Neck is supple without bruit.    Cardiac exam no murmur or gallop. Lungs are clear to auscultation. Distal pulses are  well felt.  Neurological Exam ; Awake alert oriented to time and place.  Right gaze preference but able to look to the left past midline.  Dense left homonymous hemianopsia.  Left lower facial moderate weakness.  Tongue midline.  Left dense hemiplegia with only trace withdrawal to painful stimuli.  Flaccidity and hypotonia on the left.  Good antigravity strength on the right.  Diminished left hemibody sensation but no neglect.  Gait deferred ASSESSMENT/PLAN Mr. Arshan Jabs is a 35 y.o. male with history of polysubstance abuse (cocaine, THC, opiates, vaping) presenting with left hemiparesis and right gaze preference, found to have right PCA territory ischemic infarcts. Out of window for alteplase. 1 pass endovascular treatment, TICI 0. Procedure aborted due to robust collateral flow to MCA territory from left side circulation. Patient has fetal PCAs, explaining PCA stroke from anterior circulation thrombus.   Stroke  right PCA infarct, secondary to terminal right ICA occlusion s/p failed mechanical thrombectomy etiology likely secondary to cocaine use/vasospasm Code Stroke CT head No acute abnormality.. ASPECTS 10.  CTA head & neck : questionable right ICA dissection at cavernous segment with more distal siphon occlusion, right proximal Pcomm and right proximal P2 occlusions  CT perfusion large area of penumbra without large core  Cerebral angio no discernable dissection flap but with decreased caliber right cervical ICA. Left to right perfusion of right sided MAC territory branches without MCA branch occlusion and no proximal occlusion of right PCA  MRI  multifocal ischemic infarcts of right PCA territory  involving occipital, medical temporal, posterior limb of internal capsule, and ventrolateral thalamic regions.  MRA , after angio right P1 occlusion , right Ica occlusion  2D Echo LVEF 65-70%, no regional wall motion abnormalities, mild LVH  LDL 126. Placed on high intensity statin, not home  medication, atorv 80 qd  HgbA1c 5.0 VTE prophylaxis - enoxaparin 40 qd     Diet   DIET DYS 2 Room service appropriate? Yes with Assist; Fluid consistency: Thin   No antithrombotic prior to admission, now on aspirin 81 mg daily and clopidogrel 75 mg daily. X 90 days, then switch to monotherapy with asa alone . Had been on heparin gtt for unclear reasons, discontinued this am.  Therapy recommendations:  SLP dysphagia 2 diet, CIR OT/PT recs , needs 24 hour supervision  Disposition:  for CIR   Hypertension Home meds:  none  Stable: d/c'd clevidipine gtt at 1500 on 9/15  Permissive hypertension (OK if < 220/120) but gradually normalize in 5-7 days Long-term BP goal normotensive  Hyperlipidemia Home meds:  none, added atorv 80  LDL 126, goal < 70 Continue statin at discharge  Other Stroke Risk Factors Cigarette smoker, vapes, THC, cocaine, advised to stop using  ETOH use, alcohol level <10, advised to drink no more than 1-2 drink(s) a day Substance abuse - UDS:  THC POSITIVE, Cocaine POSITIVE. Patient advised to stop using due to stroke risk. Migraines: advised against triptans    Other Active Problems Dysphagia: SLP following, passed for dysphagia 2 diet Respiratory failure in setting of acute ischemic infarct: extubated successfully today Cervical spine clearance: had [place] J collar on, likely secondary to concern of dissection. Clear for removal.   Hospital day # [place], [facility]   STROKE MD NOTE : I have personally obtained history,examined this patient, reviewed notes, independently viewed imaging studies, participated in medical decision making and plan of care.ROS completed by me personally and pertinent positives fully documented  I have made any additions or clarifications directly to the above note. Agree with note above.  Patient presented with left hemiplegia due to terminal right ICA occlusion and presented outside time window for thrombolysis.  He  underwent mechanical thrombectomy attempt which was unsuccessful but fortunately has good collaterals to the MCA from the left side.  MRI scan shows right PCA infarct.  Neurological exam shows significant left hemiplegia with hemianopsia.  Continue close neurological monitoring and strict blood pressure control as per post intervention protocol.  Discontinue IV heparin and changed to aspirin and Plavix if patient is able to swallow.  Therapy consults.  Discontinue neck collar.  Check echocardiogram.  Patient counseled to quit cocaine and marijuana.  Long discussion with patient's family at the bedside and answered questions.  Discussed with Dr. Tacy Learn critical care medicine This patient is critically ill and at significant risk of neurological worsening, death and care requires constant monitoring of vital signs, hemodynamics,respiratory and cardiac monitoring, extensive review of multiple databases, frequent neurological assessment, discussion with family, other specialists and medical decision making of high complexity.I have made any additions or clarifications directly to the above note.This critical care time does not reflect procedure time, or teaching time or supervisory time of PA/NP/Med Resident etc but could involve care discussion time.  I spent 40 minutes of neurocritical care time  in the care of  this patient.     Antony Contras, MD Medical Director [place] Pager: [phone] 02/15/2021 5:32 PM  To contact Stroke Continuity provider, please  refer to [email or web address] After hours, contact General Neurology

## 2021-02-15 NOTE — Progress Notes (Signed)
NeuroInterventional Radiology  Pre-Procedure Note  History: 35 yo male presents as code stroke with left sided weakness, corresponding to right distal cervical ICA occlusion. The patient was reportedly at home with a fall in the shower, and then was able to take care of himself and get to bed.     Pt arrived at 2137 with R sided gaze, L sided weakness, slurred speech, and L facial droop, NIH-12,   Additional history of habitual marijuana use, smoking/vaping, occasional cocaine use.    Baseline mRS: 0 NIHSS:  12 @2235   CT ASPECTS:  10 CTA:   Right ICA occlusion at the siphon, dissection of cervical ICA CTP:   Core = 0cc, Penumbra = 120cc   I have discussed the case with Dr. of Stroke Neurology.  The patient has significant NIHSS/symptoms, however, there is evidence on the CTA of adequate left->right cross-filling, and no ELVO of the right MCA. The working diagnosis by the CTA is a dissection of the cervical ICA, with either a related thrombo-embolism of the siphon, or dissection-related occlusion.  Either way, with the background of dissection of the artery, there is hazard with proceeding with crossing the siphon and terminus, given no evidence of MCA occlusion. Above all, the patient remains symptomatic.    Given the patient's symptoms, imaging findings, baseline function, we agree they are an appropriate candidate for 4-vessel angiography, for assessment, and possible attempt for mechanical thrombectomy.    The risks and benefits of the procedure were discussed with the patient/patient's family, with specific risks including: bleeding, infection, arterial injury/dissection, contrast reaction, kidney injury, need for further procedure/surgery, neurologic deficit, 10-15% risk of intracranial hemorrhage, cardiopulmonary collapse, death. All questions were answered.  The patient/family would like to proceed with attempt at thrombectomy.   Plan for cerebral angiogram and possible attempt  at mechanical thrombectomy.   Signed,   Iver Nestle. Yvone Neu, DO

## 2021-02-15 NOTE — Progress Notes (Signed)
Patient arrived to 4NRM 25.  Placed pt on ventilator.  Patient is currently being transferred to MRI.

## 2021-02-15 NOTE — Anesthesia Postprocedure Evaluation (Signed)
Anesthesia Post Note  Patient: David Craig  Procedure(s) Performed: IR WITH ANESTHESIA     Patient location during evaluation: ICU Anesthesia Type: General Level of consciousness: sedated Pain management: pain level controlled Vital Signs Assessment: post-procedure vital signs reviewed and stable Respiratory status: patient remains intubated per anesthesia plan Cardiovascular status: stable Postop Assessment: no apparent nausea or vomiting Anesthetic complications: no   No notable events documented.  Last Vitals:  Vitals:   02/15/21 0645 02/15/21 0700  BP: 122/85 121/84  Pulse: 68 68  Resp: 14 14  Temp:    SpO2: 99% 99%    Last Pain:  Vitals:   02/15/21 0400  TempSrc: Axillary  PainSc:                  Catheryn Bacon Darrah Dredge

## 2021-02-15 NOTE — Progress Notes (Signed)
This morning the patient was weaned 50% off sedation at 0800 and later completely off at 0830 per instruction of MD. Patient was switched to weaning mode on vent and was Alert and tolerating it well. Patient was then extubated at 0859, tolerated well; Minutes after patient projectile vomited, Zofran IV 4 mg was ordered and given, and there have been no more instances of vomiting.

## 2021-02-15 NOTE — Progress Notes (Signed)
35 year old male who was admitted with acute stroke due to right ICA occlusion, MRI confirmed right PCA territory infarct and posterior limb of internal capsule infarction.  This morning he tolerated pressure support, was successfully extubated Will remove neck collar, considering normal neck CT without contrast and patient is not complaining of neck pain. Stroke team is following     Cheri Fowler MD [facility] See Amion for pager If no response to pager, please call [phone] until 7pm After 7pm, Please call E-link [phone]

## 2021-02-15 NOTE — Evaluation (Signed)
Clinical/Bedside Swallow Evaluation Patient Details  Name: David Craig MRN: [id number] Date of Birth: [birth year]  Today's Date: 02/15/2021 Time: SLP Start Time (ACUTE ONLY): 1255 SLP Stop Time (ACUTE ONLY): 1320 SLP Time Calculation (min) (ACUTE ONLY): 25 min  Past Medical History: No past medical history on file. Past Surgical History:  Past Surgical History:  Procedure Laterality Date   IR ANGIO INTRA EXTRACRAN SEL COM CAROTID INNOMINATE UNI L MOD SED  02/15/2021   IR ANGIO VERTEBRAL SEL VERTEBRAL BILAT MOD SED  02/15/2021   IR CT HEAD LTD  02/15/2021   IR PERCUTANEOUS ART THROMBECTOMY/INFUSION INTRACRANIAL INC DIAG ANGIO  02/15/2021   IR US GUIDE VASC ACCESS RIGHT  02/15/2021   RADIOLOGY WITH ANESTHESIA N/A 02/14/2021   Procedure: IR WITH ANESTHESIA;  Surgeon: Julieanne Cotton, MD;  Location: [facility] OR;  Service: Radiology;  Laterality: N/A;   HPI:  35 y.o. male admitted s/p fall in shower with left side weakness. MRI: acute right PCA stroke with additional right carotid terminus thrombus without MCA territory ischemia. ETT for IR procedure (9/14- 9/15) PHMx: polysubstance abuse (cocaine, tobacco, vaping, oxycodone), migraine headaches.    Assessment / Plan / Recommendation  Clinical Impression  Pt seen for bedside swallow evaluation with wife present who states pt has been eager to consume POs, especially liquids. Pt very lethargic, but maintained adequate alertness throughout PO intake despite closed eyes. Oral mechanism examination significant for L facial asymmetry and lingual deviation to the L. Pt impulsive with sips of thin via straw, which resulted in immediate overt coughing and suspect airway compromise. Multiple trials of small sips via cup eliminated s/sx. Solid POs were significant for very mild pocketing in L buccal cavity, especially with regular textures. With min verbal cues, pt cleared residuals with lingual sweep and liquid wash x1. Lethargy quickly increased moments after  evaluation was completed and pt was asleep, which concerns this clinician for safety and aspiration risk. Recommend initiation of dys 2, thin liquid diet (no straws) with SLP to f/u for tolerance and use of strategies to improve safety with PO intake. He will require full supervision for all meals. Educated Charity fundraiser and wife who expressed agreement.  SLP Visit Diagnosis: Dysphagia, unspecified (R13.10)    Aspiration Risk  Mild aspiration risk    Diet Recommendation Dysphagia 2 (Fine chop);Thin liquid   Liquid Administration via: Cup;No straw Medication Administration: Crushed with puree Supervision: Staff to assist with self feeding;Full supervision/cueing for compensatory strategies Compensations: Minimize environmental distractions;Slow rate;Small sips/bites;Lingual sweep for clearance of pocketing;Follow solids with liquid Postural Changes: Seated upright at 90 degrees    Other  Recommendations Oral Care Recommendations: Oral care BID;Staff/trained caregiver to provide oral care    Recommendations for follow up therapy are one component of a multi-disciplinary discharge planning process, led by the attending physician.  Recommendations may be updated based on patient status, additional functional criteria and insurance authorization.  Follow up Recommendations Inpatient Rehab;24 hour supervision/assistance      Frequency and Duration min 2x/week  2 weeks       Prognosis Prognosis for Safe Diet Advancement: Good Barriers to Reach Goals: Severity of deficits      Swallow Study   General Date of Onset: 02/15/21 HPI: 35 y.o. male admitted s/p fall in shower with left side weakness. MRI: acute right PCA stroke with additional right carotid terminus thrombus without MCA territory ischemia. ETT for IR procedure (9/14- 9/15) PHMx: polysubstance abuse (cocaine, tobacco, vaping, oxycodone), migraine headaches. Type of Study: Bedside Swallow Evaluation  Previous Swallow Assessment: none per  EMR Diet Prior to this Study: NPO Temperature Spikes Noted: No Respiratory Status: Nasal cannula History of Recent Intubation: Yes Length of Intubations (days): 1 days Date extubated: 02/15/21 Behavior/Cognition: Lethargic/Drowsy;Requires cueing;Cooperative;Pleasant mood Oral Cavity Assessment: Within Functional Limits Oral Care Completed by SLP: Other (Comment) (recent completion by spouse) Oral Cavity - Dentition: Adequate natural dentition Vision: Functional for self-feeding Self-Feeding Abilities: Needs assist Patient Positioning: Upright in bed Baseline Vocal Quality: Normal Volitional Cough: Weak Volitional Swallow: Able to elicit    Oral/Motor/Sensory Function Overall Oral Motor/Sensory Function: Mild impairment Facial ROM: Reduced left;Suspected CN VII (facial) dysfunction Facial Symmetry: Abnormal symmetry left;Suspected CN VII (facial) dysfunction Lingual ROM: Within Functional Limits Lingual Symmetry: Abnormal symmetry left;Suspected CN XII (hypoglossal) dysfunction Velum: Within Functional Limits   Ice Chips Ice chips: Within functional limits Presentation: Spoon   Thin Liquid Thin Liquid: Impaired Presentation: Cup;Straw Pharyngeal  Phase Impairments: Cough - Immediate;Suspected delayed Swallow    Nectar Thick Nectar Thick Liquid: Not tested   Honey Thick Honey Thick Liquid: Not tested   Puree Puree: Within functional limits Presentation: Spoon   Solid     Solid: Impaired Presentation: Self Fed Oral Phase Functional Implications: Left lateral sulci pocketing     Avie Echevaria, MA, CCC-SLP Acute Rehabilitation Services Office Number: [phone]  Paulette Blanch 02/15/2021,2:33 PM

## 2021-02-16 ENCOUNTER — Inpatient Hospital Stay (HOSPITAL_COMMUNITY): Payer: BC Managed Care – PPO

## 2021-02-16 DIAGNOSIS — I639 Cerebral infarction, unspecified: Secondary | ICD-10-CM

## 2021-02-16 DIAGNOSIS — E876 Hypokalemia: Secondary | ICD-10-CM

## 2021-02-16 DIAGNOSIS — I6521 Occlusion and stenosis of right carotid artery: Secondary | ICD-10-CM

## 2021-02-16 LAB — CBC
HCT: 43.7 % (ref 39.0–52.0)
Hemoglobin: 15 g/dL (ref 13.0–17.0)
MCH: 30.5 pg (ref 26.0–34.0)
MCHC: 34.3 g/dL (ref 30.0–36.0)
MCV: 88.8 fL (ref 80.0–100.0)
Platelets: 151 10*3/uL (ref 150–400)
RBC: 4.92 MIL/uL (ref 4.22–5.81)
RDW: 13 % (ref 11.5–15.5)
WBC: 11.9 10*3/uL — ABNORMAL HIGH (ref 4.0–10.5)
nRBC: 0 % (ref 0.0–0.2)

## 2021-02-16 LAB — COMPREHENSIVE METABOLIC PANEL
ALT: 39 U/L (ref 0–44)
AST: 25 U/L (ref 15–41)
Albumin: 3.5 g/dL (ref 3.5–5.0)
Alkaline Phosphatase: 49 U/L (ref 38–126)
Anion gap: 10 (ref 5–15)
BUN: 12 mg/dL (ref 6–20)
CO2: 28 mmol/L (ref 22–32)
Calcium: 9 mg/dL (ref 8.9–10.3)
Chloride: 100 mmol/L (ref 98–111)
Creatinine, Ser: 0.81 mg/dL (ref 0.61–1.24)
GFR, Estimated: >60 mL/min (ref >60)
Glucose, Bld: 102 mg/dL — ABNORMAL HIGH (ref 70–99)
Potassium: 3.2 mmol/L — ABNORMAL LOW (ref 3.5–5.1)
Sodium: 138 mmol/L (ref 135–145)
Total Bilirubin: 1.1 mg/dL (ref 0.3–1.2)
Total Protein: 6.3 g/dL — ABNORMAL LOW (ref 6.5–8.1)

## 2021-02-16 LAB — LUPUS ANTICOAGULANT PANEL
DRVVT: 31.3 s (ref 0.0–47.0)
PTT Lupus Anticoagulant: 32 s (ref 0.0–51.9)

## 2021-02-16 LAB — MAGNESIUM: Magnesium: 2.2 mg/dL (ref 1.7–2.4)

## 2021-02-16 LAB — HOMOCYSTEINE: Homocysteine: 4.4 umol/L (ref 0.0–14.5)

## 2021-02-16 MED ORDER — PANTOPRAZOLE SODIUM 40 MG PO TBEC
40.0000 mg | DELAYED_RELEASE_TABLET | Freq: Every day | ORAL | Status: DC
  Administered 2021-02-17 – 2021-02-22 (×6): 40 mg via ORAL
  Filled 2021-02-16 (×6): qty 1

## 2021-02-16 MED ORDER — DOCUSATE SODIUM 50 MG/5ML PO LIQD: 100.0000 mg | Freq: Two times a day (BID) | ORAL | Status: DC

## 2021-02-16 MED ORDER — POLYETHYLENE GLYCOL 3350 17 G PO PACK
17.0000 g | PACK | Freq: Every day | ORAL | Status: DC
  Administered 2021-02-18 – 2021-02-22 (×4): 17 g via ORAL
  Filled 2021-02-16 (×5): qty 1

## 2021-02-16 MED ORDER — POTASSIUM CHLORIDE 10 MEQ/100ML IV SOLN
10.0000 meq | INTRAVENOUS | Status: AC
Start: 2021-02-16 — End: 2021-02-16
  Administered 2021-02-16 (×2): 10 meq via INTRAVENOUS
  Filled 2021-02-16 (×6): qty 100

## 2021-02-16 MED ORDER — POTASSIUM CHLORIDE CRYS ER 20 MEQ PO TBCR
40.0000 meq | EXTENDED_RELEASE_TABLET | Freq: Once | ORAL | Status: AC
  Administered 2021-02-16: 40 meq via ORAL
  Filled 2021-02-16: qty 2

## 2021-02-16 NOTE — Progress Notes (Signed)
Speech Language Pathology Treatment: Dysphagia  Patient Details Name: David Craig MRN: [id number] DOB: Sep 16, 1985 Today's Date: 02/16/2021 Time: 1241-1300 SLP Time Calculation (min) (ACUTE ONLY): 8 min  Assessment / Plan / Recommendation Clinical Impression  Pt lethargic but able to sustain wakeful state. He opens eyes when requested with significant left neglect. Oral deficit includes reduced left labial sensation leading to asensate residue with cracker. Good left ROM in attempts to remove however incomplete needing cues- visual feedback with mirror/phone would be helpful during meals. Family has been bringing in foods that are softer per wife. He consumed thin water pacing himself without cues or concerns for aspiration. Wife describes impulsivity with self feeding last night with milkshake. Given supervision and visual biofeedback for strategies, suspect he can tolerate a regular texture diet, continue thin, pills whole in puree. Full supervision needed given impulsivity. ST to follow.    HPI HPI: 35 y.o. male admitted s/p fall in shower with left side weakness. MRI: acute right PCA stroke with additional right carotid terminus thrombus without MCA territory ischemia. ETT for IR procedure (9/14- 9/15) PHMx: polysubstance abuse (cocaine, tobacco, vaping, oxycodone), migraine headaches.      SLP Plan  Continue with current plan of care      Recommendations for follow up therapy are one component of a multi-disciplinary discharge planning process, led by the attending physician.  Recommendations may be updated based on patient status, additional functional criteria and insurance authorization.    Recommendations  Diet recommendations: Regular;Thin liquid Liquids provided via: Cup;Straw Medication Administration: Whole meds with puree Supervision: Patient able to self feed;Full supervision/cueing for compensatory strategies Compensations: Slow rate;Minimize environmental distractions;Small  sips/bites Postural Changes and/or Swallow Maneuvers: Seated upright 90 degrees                General recommendations: Rehab consult Oral Care Recommendations: Oral care BID Follow up Recommendations: Inpatient Rehab SLP Visit Diagnosis: Dysphagia, unspecified (R13.10) Plan: Continue with current plan of care                       [name]  02/16/2021, 2:11 PM  Breck Coons Lonell Face.Ed Nurse, children's [phone] Office [phone]

## 2021-02-16 NOTE — Progress Notes (Addendum)
NAME:  David Craig, MRN:  [id number], DOB:  [birth year], LOS: 2 ADMISSION DATE:  02/14/2021, CONSULTATION DATE:  9/15 REFERRING MD:  Dr. Iver Nestle, CHIEF COMPLAINT: Stroke  History of Present Illness:  Patient is encephalopathic and/or intubated. Therefore history has been obtained from chart review.   35 year old male with no significant past medical history presented to [facility] emergency department on 9/14 after fall at home with left-sided weakness.  Last known well at 10 AM on 9/14 at which time he suffered a mechanical fall in the shower and struck his head.  He described brief loss of consciousness and proceeded to take arrest.  When family checked on him later in the day he was noted to have weakness in the left arm and leg.  Family called EMS who transported the patient to [facility] emergency department as a code stroke after also noticing left facial droop and slurred speech. Imaging concerning for dissection of the distarl right ICA, which becomes occluded proximal to the carotid terminus with a large areas of ischemic penumbra. He was taken emergently to IR, however, he was not able to be successfully revascularized. Post op he was taken to the ICU on the ventilator.   Of note, per IR note the patient did describe occasional cocaine use, as well as THC and smoking/vaping.   Pertinent  Medical History   has no past medical history on file.   Significant Hospital Events: Including procedures, antibiotic start and stop dates in addition to other pertinent events   9/14 admit with CVA> IR with TICI 0 9/15 failed mechanical thrombectomy. extubated  Interim History / Subjective:   Somnolent but arousable and follows commands. Wife at bedside. Extubated yesterday witihout issue.   Objective   Blood pressure (!) 135/94, pulse 70, temperature 98.7 F (37.1 C), temperature source Oral, resp. rate (!) 23, height 5\' 8"  (1.727 m), weight 104.3 kg, SpO2 94 %.        Intake/Output Summary  (Last 24 hours) at 02/16/2021 1035 Last data filed at 02/16/2021 0800 Gross per 24 hour  Intake 139.4 ml  Output 350 ml  Net -210.6 ml   Filed Weights   02/14/21 2145  Weight: 104.3 kg    Examination: General: young man, resting comfortably, no distress HENT: [place]/AT, PERRL, no JVD Lungs: Clear bilateral breath sounds.  Cardiovascular: RRR, no MRG Abdomen: Soft, non-tender, non-distended Extremities: No acute deformity. No edema.  Neuro: somnolemnt but arousable. Left sided hemiparesis, follows commands  MRI brain 9/15 > multifocal acute ischemia in the right PCA territory. Could affect the optic radiation. Occlusion of the distal right ICA. Favor thrombus over dissection of the ICA. Right PCA P1 seg occlusion.   Resolved Hospital Problem list   Acute hypoxemic respiratory failure - extubated 9/15  Assessment & Plan:   ICA occlusion on the R. ? Dissection. Appeared to be dissection on CTA, but more like thormbus on MRI. TICI 0 result in IR. He did have collateral flow to R MCA territory on angiogram.  - Neurology primary - Cervical collar discontinued.  - Neuro checks - Heparin infusion per neuro - PT/OT eval  ? Substance abuse - urine tox shows cocaine, THC  ALT elevation: reason unclear. Etoh negative - repeat chemistry shows normal LFTs  Hypokalemia - will change IV replacement to PO - recheck bmet in am   Best Practice (right click and "Reselect all SmartList Selections" daily)   Diet/type: NPO DVT prophylaxis: systemic heparin GI prophylaxis: PPI Lines: N/A Foley:  N/A Code Status:  full code Last date of multidisciplinary goals of care discussion [talked with wife at bedside 9/16 about next steps]  PCCM will see as needed. May be able to leave ICU, defer to neurology.  Durel Salts, MD Pulmonary and Critical Care Medicine [facility]   CBC: Recent Labs  Lab 02/14/21 2150 02/14/21 2209 02/15/21 0418 02/16/21 0148  WBC 13.9*  --    --  11.9*  NEUTROABS 11.9*  --   --   --   HGB 17.9* 17.3* 16.0 15.0  HCT 50.4 51.0 47.0 43.7  MCV 87.5  --   --  88.8  PLT 176  --   --  151    Basic Metabolic Panel: Recent Labs  Lab 02/14/21 2150 02/14/21 2209 02/15/21 0418 02/16/21 0148  NA 139 140 139 138  K 4.0 4.2 3.5 3.2*  CL 101 103  --  100  CO2 28  --   --  28  GLUCOSE 131* 127*  --  102*  BUN 13 16  --  12  CREATININE 0.70 0.70  --  0.81  CALCIUM 9.7  --   --  9.0  MG  --   --   --  2.2   GFR: Estimated Creatinine Clearance: 146.2 mL/min (by C-G formula based on SCr of 0.81 mg/dL). Recent Labs  Lab 02/14/21 2150 02/16/21 0148  WBC 13.9* 11.9*    Liver Function Tests: Recent Labs  Lab 02/14/21 2150 02/16/21 0148  AST 31 25  ALT 58* 39  ALKPHOS 64 49  BILITOT 1.3* 1.1  PROT 7.6 6.3*  ALBUMIN 4.4 3.5   No results for input(s): LIPASE, AMYLASE in the last 168 hours. No results for input(s): AMMONIA in the last 168 hours.  ABG    Component Value Date/Time   PHART 7.384 02/15/2021 0418   PCO2ART 48.3 (H) 02/15/2021 0418   PO2ART 159 (H) 02/15/2021 0418   HCO3 28.9 (H) 02/15/2021 0418   TCO2 30 02/15/2021 0418   O2SAT 99.0 02/15/2021 0418     Coagulation Profile: Recent Labs  Lab 02/14/21 2150  INR 1.0    Cardiac Enzymes: No results for input(s): CKTOTAL, CKMB, CKMBINDEX, TROPONINI in the last 168 hours.  HbA1C: Hgb A1c MFr Bld  Date/Time Value Ref Range Status  02/15/2021 02:43 AM 5.0 4.8 - 5.6 % Final    Comment:    (NOTE) Pre diabetes:          5.7%-6.4%  Diabetes:              >6.4%  Glycemic control for   <7.0% adults with diabetes

## 2021-02-16 NOTE — Progress Notes (Signed)
STROKE TEAM PROGRESS NOTE   INTERVAL HISTORY His wife and mother are at the bedside.   Patient is sitting in a bedside chair.  He continues to have dense left hemiplegia and mild inattention left-sided facial loss.  Blood pressure adequately controlled.  Transcranial Doppler bubble study was performed at the bedside and was positive for small right to left shunt which may be incidental and not likely cause of her stroke Vitals:   02/16/21 0500 02/16/21 0600 02/16/21 0700 02/16/21 0800  BP:  130/88 (!) 135/100 (!) 135/94  Pulse: 64 66 69 70  Resp: 16 14  (!) 23  Temp:    98.7 F (37.1 C)  TempSrc:    Oral  SpO2: 94% 92% 94% 94%  Weight:      Height:       CBC:  Recent Labs  Lab 02/14/21 2150 02/14/21 2209 02/15/21 0418 02/16/21 0148  WBC 13.9*  --   --  11.9*  NEUTROABS 11.9*  --   --   --   HGB 17.9*   < > 16.0 15.0  HCT 50.4   < > 47.0 43.7  MCV 87.5  --   --  88.8  PLT 176  --   --  151   < > = values in this interval not displayed.   Basic Metabolic Panel:  Recent Labs  Lab 02/14/21 2150 02/14/21 2209 02/15/21 0418 02/16/21 0148  NA 139 140 139 138  K 4.0 4.2 3.5 3.2*  CL 101 103  --  100  CO2 28  --   --  28  GLUCOSE 131* 127*  --  102*  BUN 13 16  --  12  CREATININE 0.70 0.70  --  0.81  CALCIUM 9.7  --   --  9.0  MG  --   --   --  2.2   Lipid Panel:  Recent Labs  Lab 02/15/21 0243  CHOL 197  TRIG 190*  HDL 33*  CHOLHDL 6.0  VLDL 38  LDLCALC 126*   HgbA1c:  Recent Labs  Lab 02/15/21 0243  HGBA1C 5.0   Urine Drug Screen:  Recent Labs  Lab 02/15/21 0243  LABOPIA NONE DETECTED  COCAINSCRNUR POSITIVE*  LABBENZ NONE DETECTED  AMPHETMU NONE DETECTED  THCU POSITIVE*  LABBARB NONE DETECTED    Alcohol Level  Recent Labs  Lab 02/14/21 2150  ETH <10    IMAGING past 24 hours ECHOCARDIOGRAM COMPLETE  Result Date: 02/15/2021    ECHOCARDIOGRAM REPORT   Patient Name:   David Craig Date of Exam: 02/15/2021 Medical Rec #:  [id number]    Height:        68.0 in Accession #:    [id number]   Weight:       230.0 lb Date of Birth:  [birth year]    BSA:          2.169 m Patient Age:    35 years     BP:           125/82 mmHg Patient Gender: M            HR:           69 bpm. Exam Location:  Inpatient Procedure: 2D Echo, Cardiac Doppler and Color Doppler Indications:    CVA  History:        Patient has no prior history of Echocardiogram examinations.                 CVA.  Sonographer:  [place] Referring Phys: [id number] [place]  1. Left ventricular ejection fraction, by estimation, is 65 to 70%. The left ventricle has normal function. The left ventricle has no regional wall motion abnormalities. There is mild left ventricular hypertrophy. Left ventricular diastolic parameters were normal.  2. Right ventricular systolic function is normal. The right ventricular size is normal.  3. The mitral valve is normal in structure. Trivial mitral valve regurgitation.  4. The aortic valve is normal in structure. Aortic valve regurgitation is not visualized. FINDINGS  Left Ventricle: Left ventricular ejection fraction, by estimation, is 65 to 70%. The left ventricle has normal function. The left ventricle has no regional wall motion abnormalities. The left ventricular internal cavity size was normal in size. There is  mild left ventricular hypertrophy. Left ventricular diastolic parameters were normal. Right Ventricle: The right ventricular size is normal. Right vetricular wall thickness was not assessed. Right ventricular systolic function is normal. Left Atrium: Left atrial size was normal in size. Right Atrium: Right atrial size was normal in size. Pericardium: There is no evidence of pericardial effusion. Mitral Valve: The mitral valve is normal in structure. Trivial mitral valve regurgitation. Tricuspid Valve: The tricuspid valve is normal in structure. Tricuspid valve regurgitation is trivial. Aortic Valve: The aortic valve is normal in structure. Aortic valve  regurgitation is not visualized. Aortic valve mean gradient measures 3.0 mmHg. Aortic valve peak gradient measures 5.1 mmHg. Aortic valve area, by VTI measures 2.51 cm. Pulmonic Valve: The pulmonic valve was grossly normal. Pulmonic valve regurgitation is not visualized. Aorta: The aortic root is normal in size and structure. IAS/Shunts: No atrial level shunt detected by color flow Doppler.  LEFT VENTRICLE PLAX 2D LVIDd:         4.50 cm     Diastology LVIDs:         2.90 cm     LV e' medial:    11.70 cm/s LV PW:         1.10 cm     LV E/e' medial:  7.7 LV IVS:        1.40 cm     LV e' lateral:   10.80 cm/s LVOT diam:     1.90 cm     LV E/e' lateral: 8.4 LV SV:         59 LV SV Index:   27 LVOT Area:     2.84 cm  LV Volumes (MOD) LV vol d, MOD A4C: 55.0 ml LV vol s, MOD A4C: 18.3 ml LV SV MOD A4C:     55.0 ml RIGHT VENTRICLE TAPSE (M-mode): 2.5 cm LEFT ATRIUM             Index       RIGHT ATRIUM           Index LA diam:        2.60 cm 1.20 cm/m  RA Area:     10.70 cm LA Vol (A2C):   37.7 ml 17.38 ml/m RA Volume:   18.70 ml  8.62 ml/m LA Vol (A4C):   31.7 ml 14.62 ml/m LA Biplane Vol: 34.2 ml 15.77 ml/m  AORTIC VALVE                   PULMONIC VALVE AV Area (Vmax):    2.56 cm    PV Vmax:       1.09 m/s AV Area (Vmean):   2.51 cm    PV Peak grad:  4.8 mmHg AV  Area (VTI):     2.51 cm AV Vmax:           113.00 cm/s AV Vmean:          73.500 cm/s AV VTI:            0.234 m AV Peak Grad:      5.1 mmHg AV Mean Grad:      3.0 mmHg LVOT Vmax:         102.00 cm/s LVOT Vmean:        65.000 cm/s LVOT VTI:          0.207 m LVOT/AV VTI ratio: 0.88  AORTA Ao Root diam: 3.40 cm Ao Asc diam:  3.40 cm MITRAL VALVE MV Area (PHT): 3.86 cm    SHUNTS MV E velocity: 90.30 cm/s  Systemic VTI:  0.21 m MV A velocity: 50.30 cm/s  Systemic Diam: 1.90 cm MV E/A ratio:  1.80 Dorris Carnes MD Electronically signed by Dorris Carnes MD Signature Date/Time: 02/15/2021/2:16:26 PM    Final     PHYSICAL EXAM Pleasant young Caucasian male not in  distress.  He has a neck collar. . Afebrile. Head is nontraumatic. Neck is supple without bruit.    Cardiac exam no murmur or gallop. Lungs are clear to auscultation. Distal pulses are well felt.  Neurological Exam ; Awake alert oriented to time and place.  Right gaze preference but able to look to the left past midline.  Dense left homonymous hemianopsia.  Left lower facial moderate weakness.  Tongue midline.  Left dense hemiplegia with only trace withdrawal to painful stimuli.  Flaccidity and hypotonia on the left.  Good antigravity strength on the right.  Diminished left hemibody sensation but no neglect.  Gait deferred ASSESSMENT/PLAN David Craig is a 35 y.o. male with history of polysubstance abuse (cocaine, THC, opiates, vaping) presenting with left hemiparesis and right gaze preference, found to have right PCA territory ischemic infarcts. Out of window for alteplase. 1 pass endovascular treatment, TICI 0. Procedure aborted due to robust collateral flow to MCA territory from left side circulation. Patient has fetal PCAs, explaining PCA stroke from anterior circulation thrombus.   Stroke  right PCA infarct, secondary to terminal right ICA occlusion s/p failed mechanical thrombectomy etiology likely secondary to cocaine use/vasospasm Code Stroke CT head No acute abnormality.. ASPECTS 10.  CTA head & neck : questionable right ICA dissection at cavernous segment with more distal siphon occlusion, right proximal Pcomm and right proximal P2 occlusions  CT perfusion large area of penumbra without large core  Cerebral angio no discernable dissection flap but with decreased caliber right cervical ICA. Left to right perfusion of right sided MAC territory branches without MCA branch occlusion and no proximal occlusion of right PCA  MRI  multifocal ischemic infarcts of right PCA territory involving occipital, medical temporal, posterior limb of internal capsule, and ventrolateral thalamic regions.  MRA ,  after angio right P1 occlusion , right Ica occlusion  2D Echo LVEF 65-70%, no regional wall motion abnormalities, mild LVH  Transcranial Doppler study reveals grade 1 PFO Lower extremity venous Dopplers negative for DVT LDL 126. Placed on high intensity statin, not home medication, atorv 80 qd  HgbA1c 5.0 VTE prophylaxis - enoxaparin 40 qd     Diet   DIET DYS 2 Room service appropriate? Yes with Assist; Fluid consistency: Thin   No antithrombotic prior to admission, now on aspirin 81 mg daily and clopidogrel 75 mg daily. X 90 days, then switch  to monotherapy with asa alone . Had been on heparin gtt for unclear reasons, discontinued this am.  Therapy recommendations:  SLP dysphagia 2 diet, CIR OT/PT recs , needs 24 hour supervision  Disposition:  for CIR   Hypertension Home meds:  none  Stable: d/c'd clevidipine gtt at 1500 on 9/15  Permissive hypertension (OK if < 220/120) but gradually normalize in 5-7 days Long-term BP goal normotensive  Hyperlipidemia Home meds:  none, added atorv 80  LDL 126, goal < 70 Continue statin at discharge  Other Stroke Risk Factors Cigarette smoker, vapes, THC, cocaine, advised to stop using  ETOH use, alcohol level <10, advised to drink no more than 1-2 drink(s) a day Substance abuse - UDS:  THC POSITIVE, Cocaine POSITIVE. Patient advised to stop using due to stroke risk. Migraines: advised against triptans    Other Active Problems Dysphagia: SLP following, passed for dysphagia 2 diet Respiratory failure in setting of acute ischemic infarct: extubated successfully today Cervical spine clearance: had [place] J collar on, likely secondary to concern of dissection. Clear for removal.   Hospital day # 2     Patient presented with left hemiplegia due to terminal right ICA occlusion and presented outside time window for thrombolysis.  He underwent mechanical thrombectomy attempt which was unsuccessful but fortunately has good collaterals to the MCA from  the left side.  MRI scan shows right PCA infarct.  Neurological exam shows significant left hemiplegia with hemianopsia.  Continue close neurological monitoring and strict blood pressure control as per post intervention protocol.   Continue aspirin and Plavix if patient is able to swallow.  Therapy consults.  Mobilize out of bed.  Transfer to neurology floor bed when available patient counseled to quit cocaine and marijuana.  Long discussion with patient's family at the bedside and answered questions.  Discussed with Dr. Lenice Llamas this patient is critically ill and at significant risk of neurological worsening, death and care requires constant monitoring of vital signs, hemodynamics,respiratory and cardiac monitoring, extensive review of multiple databases, frequent neurological assessment, discussion with family, other specialists and medical decision making of high complexity.I have made any additions or clarifications directly to the above note.This critical care time does not reflect procedure time, or teaching time or supervisory time of PA/NP/Med Resident etc but could involve care discussion time.  I spent 30 minutes of neurocritical care time  in the care of  this patient.     Antony Contras, MD Medical Director [facility] Pager: [phone] 02/16/2021 9:48 AM  To contact Stroke Continuity provider, please refer to [email or web address] After hours, contact General Neurology

## 2021-02-16 NOTE — Progress Notes (Signed)
Inpatient Rehab Admissions Coordinator:   CIR consult received.  Met with patient and his wife, David Craig, at the bedside to discuss recommendations, goals, and expectations.  Pt sleeps throughout most of our visit, though he did open his eyes and track to [facility] towards the end.  [name] and I discussed likely goals of min assist (pt completing up to 75% of any given task), and estimated length of stay maybe 3-4 weeks.  We discussed insurance and David Craig reports that pt does have [employer] policy from work. She will try and send me the cards so we can update his hospital account, and then I can open a case for prior auth for CIR.  I will plan to f/u with them on Monday morning and confirm insurance.   Shann Medal, PT, DPT Admissions Coordinator [phone] 02/16/21  11:51 AM

## 2021-02-16 NOTE — Progress Notes (Signed)
TCD with bubbles and bilateral lower extremity venous duplex has been completed. Preliminary results can be found in CV Proc through chart review.   02/16/21 4:04 PM Olen Cordial RVT

## 2021-02-16 NOTE — PMR Pre-admission (Signed)
PMR Admission Coordinator Pre-Admission Assessment   Patient: David Craig is an 35 y.o., male MRN: [id number] DOB: 05/27/1986 Height: 5' 8" (172.7 cm) Weight: 104.3 kg   Insurance Information HMO:     PPO: yes     PCP:      IPA:      80/20:      OTHER:  PRIMARY: [employer] [place]      Policy#: [id number]      Subscriber: pt CM Name: faxed approval      Phone#: [phone]     Fax#: [phone] Pre-Cert#: [id number] auth for CIR via fax with updates due to fax listed above on 10/4.       Employer:  Benefits:  Phone #:     Name:  Eff. Date: 01/17/2021  (HR working to retro this eff date to 11/17/20)   Deduct: $3000 ($0 met)      Out of Pocket Max: $7000 ($0 met)      Life Max: n/a CIR: 70%      SNF: 70% Outpatient: 70%     Co-Ins: 30% Home Health: 70%      Co-Ins: 30% DME: 70%     Co-Ins: 30% Providers:  SECONDARY:       Policy#:      Phone#:    Financial Counselor:       Phone#:    The "Data Collection Information Summary" for patients in Inpatient Rehabilitation Facilities with attached "Privacy Act Statement-Health Care Records" was provided and verbally reviewed with: N/A   Emergency Contact Information Contact Information       Name Relation Home Work Mobile    Curro,[name] Spouse     [phone]    Kurt,Marcus T Other [phone]             Current Medical History  Patient Admitting Diagnosis: CVA    History of Present Illness: David Craig is a 35 y/o right handed male with history of polysubstance abuse/tobacco, migraine headaches and obesity with BMI 34.97.  Presented 02/14/2021 with left-sided weakness of acute onset as well as headache.  Cranial CT scan negative.  CT angiogram head and neck showed suspected dissection of the distal right internal carotid artery beginning at the distal cavernous segment, which became occluded proximal to the carotid terminus.  The right middle and anterior cerebral arteries fill via flow across the anterior communicating  artery.  Large area of ischemic penumbra within the right hemisphere in a watershed/hypoperfusion type distribution.  Patient did not receive tPA.  Admission chemistries unremarkable except glucose 131, WBC 13,900, urine drug screen positive cocaine, as well as marijuana.  Attempts of mechanical thrombectomy for right ICA occlusion failed per interventional radiology.  MRI showed multifocal ischemic infarcts of right PCA territory involving occipital, medial temporal, posterior limb of internal capsule, and ventrolateral thalamic regions.  Echocardiogram with ejection fraction of 65 to 70%, no wall motion abnormality.  Lower extremity dopplers negative.  Transcranial doppler study revealed grade 1 PFO.  TEE completed 02/21/2021 showing no thrombus or mass small PFO.  Currently maintained on aspirin 81 mg daily and Plavix 75 mg daily x90 days, then aspirin alone.  Subcutaneous Lovenox for DVT prophylaxis.  His diet has been advanced to regular consistency.  Therapy evaluations completed due to patient's left-sided weakness was recommended for a comprehensive rehab program.   Complete NIHSS TOTAL: 11   Patient's medical record from [place] has been reviewed by the rehabilitation admission coordinator and physician.   Past Medical History    History reviewed. No pertinent past medical history.   Has the patient had major surgery during 100 days prior to admission? Yes   Family History   family history is not on file.   Current Medications   Current Facility-Administered Medications:    0.9 %  sodium chloride infusion, 250 mL, Intravenous, Continuous, Bailey-Modzik, Delila A, NP, Last Rate: 10 mL/hr at 02/16/21 0523, Rate Change at 02/16/21 0523   acetaminophen (TYLENOL) tablet 650 mg, 650 mg, Oral, Q4H PRN **OR** acetaminophen (TYLENOL) 160 MG/5ML solution 650 mg, 650 mg, Per Tube, Q4H PRN **OR** acetaminophen (TYLENOL) suppository 650 mg, 650 mg, Rectal, Q4H PRN, Bailey-Modzik, Delila A, NP    aspirin EC tablet 325 mg, 325 mg, Oral, Daily, Xu, Jindong, MD, 325 mg at 02/21/21 1043   atorvastatin (LIPITOR) tablet 80 mg, 80 mg, Oral, q1800, Bailey-Modzik, Delila A, NP, 80 mg at 02/21/21 1721   Chlorhexidine Gluconate Cloth 2 % PADS 6 each, 6 each, Topical, Daily, Bailey-Modzik, Delila A, NP, 6 each at 02/20/21 0935   clopidogrel (PLAVIX) tablet 75 mg, 75 mg, Oral, Daily, Bailey-Modzik, Delila A, NP, 75 mg at 02/21/21 1042   docusate sodium (COLACE) capsule 100 mg, 100 mg, Oral, BID, Xu, Jindong, MD, 100 mg at 02/20/21 0935   enoxaparin (LOVENOX) injection 40 mg, 40 mg, Subcutaneous, Q24H, Bailey-Modzik, Delila A, NP, 40 mg at 02/21/21 1721   MEDLINE mouth rinse, 15 mL, Mouth Rinse, BID, Bailey-Modzik, Delila A, NP, 15 mL at 02/20/21 0936   ondansetron (ZOFRAN) injection 4 mg, 4 mg, Intravenous, Q6H PRN, Bailey-Modzik, Delila A, NP, 4 mg at 02/15/21 0915   pantoprazole (PROTONIX) EC tablet 40 mg, 40 mg, Oral, Daily, Xu, Jindong, MD, 40 mg at 02/21/21 1043   polyethylene glycol (MIRALAX / GLYCOLAX) packet 17 g, 17 g, Oral, Daily, Sethi, Pramod S, MD, 17 g at 02/20/21 0936   senna-docusate (Senokot-S) tablet 1 tablet, 1 tablet, Oral, QHS PRN, Bailey-Modzik, Delila A, NP   Patients Current Diet:  Diet Order                  Diet regular Room service appropriate? Yes; Fluid consistency: Thin  Diet effective now                         Precautions / Restrictions Precautions Precautions: Fall Precaution Comments: L hemi, L homonymous hemianopsia Cervical Brace: Hard collar (discontinued by MD at end of therapy eval) Restrictions Weight Bearing Restrictions: No    Has the patient had 2 or more falls or a fall with injury in the past year? No   Prior Activity Level Community (5-7x/wk): independent prior to admit, working in HVAC sales, driving, no DME, 2 small children and spouse at home   Prior Functional Level Self Care: Did the patient need help bathing, dressing, using the  toilet or eating? Independent   Indoor Mobility: Did the patient need assistance with walking from room to room (with or without device)? Independent   Stairs: Did the patient need assistance with internal or external stairs (with or without device)? Independent   Functional Cognition: Did the patient need help planning regular tasks such as shopping or remembering to take medications? Independent   Patient Information Are you of Hispanic, Latino/a,or Spanish origin?: X. Patient unable to respond What is your race?: X. Patient unable to respond Do you need or want an interpreter to communicate with a doctor or health care staff?: 9. Unable to respond     Patient's Response To:  Health Literacy and Transportation Is the patient able to respond to health literacy and transportation needs?: No   Home Assistive Devices / Equipment Home Equipment: Hand held shower head   Prior Device Use: Indicate devices/aids used by the patient prior to current illness, exacerbation or injury? None of the above   Current Functional Level Cognition   Arousal/Alertness: Awake/alert (adequately awake) Overall Cognitive Status: Impaired/Different from baseline Current Attention Level: Sustained Orientation Level: Oriented X4 Following Commands: Follows one step commands consistently Safety/Judgement: Decreased awareness of deficits General Comments: Pt requires vc's to attend to L side, and safely move L extremeties. Pt was inappropriately impulsive, and needed re-direction to tasks. pt completed clock draw and letter cancelation with about 10% accuracy Attention: Sustained Sustained Attention: Impaired Sustained Attention Impairment: Verbal basic Memory: Impaired Memory Impairment: Storage deficit, Retrieval deficit Awareness: Impaired Awareness Impairment: Anticipatory impairment, Emergent impairment Problem Solving:  (will assess higher level) Behaviors: Impulsive Safety/Judgment: Other (comment)  (suspect impaired)    Extremity Assessment (includes Sensation/Coordination)   Upper Extremity Assessment: LUE deficits/detail LUE Deficits / Details: Little AROM.  PROM WFL.  Shoulder strength 1/5 LUE Sensation: decreased light touch, decreased proprioception (Pt attempting to roll onto LUE and needed auditory cues to attend to and dependently move LUE) LUE Coordination: decreased fine motor, decreased gross motor (limited to 0 activiation throughout LUE, some movement with shoulder shrug)  Lower Extremity Assessment: Defer to PT evaluation LLE Deficits / Details: 0/5 LLE Sensation: WNL LLE Coordination: decreased fine motor, decreased gross motor     ADLs   Overall ADL's : Needs assistance/impaired Eating/Feeding: Minimal assistance, Cueing for compensatory techinques, Sitting Eating/Feeding Details (indicate cue type and reason): assist to cut and set up tray.  Cues to find items on L of tray and not pocket on the left. Grooming: Wash/dry hands, Oral care, Moderate assistance, Standing, Cueing for compensatory techniques Grooming Details (indicate cue type and reason): educated family on how to assist pt with grooming tasks to functionally work on his deficits: place grooming supplies on empty table (eliminate distractions), place supplies at midline and eventually work towards the L to force pt to scan into his L fields Upper Body Bathing: Moderate assistance Upper Body Bathing Details (indicate cue type and reason): supported sitting Lower Body Bathing: Total assistance Lower Body Bathing Details (indicate cue type and reason): Mod A+2 sit<>stand Upper Body Dressing : Maximal assistance, Sitting Upper Body Dressing Details (indicate cue type and reason): Pt impulsive during task and forgetting LUE. Required max vc to place LUE into gown first, and then proceed with R arm. Lower Body Dressing: Total assistance Lower Body Dressing Details (indicate cue type and reason): pt donned R sock  with mod assist for balance and L sock with mod assist x2 to remain sitting and don sock. Toilet Transfer: Moderate assistance, Squat-pivot, BSC Toilet Transfer Details (indicate cue type and reason): simulated from bed>recliner. vc for hand placement, mod physical assit Toileting- Clothing Manipulation and Hygiene: Total assistance Toileting - Clothing Manipulation Details (indicate cue type and reason): pt soiled wtih urine upon arrival, unaware - incontinent Functional mobility during ADLs: Moderate assistance, +2 for physical assistance, +2 for safety/equipment, Cueing for safety General ADL Comments: pt requried heavy cueing throughout all tasks to attend to L extremeties, to sustain attention to task, to eleminate inappropriate behaviors and for implusivity. mod-max physical assist for mobility throughout     Mobility   Overal bed mobility: Needs Assistance Bed Mobility: Rolling, Sidelying to Sit   Rolling: Mod assist Sidelying to sit: Mod assist Supine to sit: Mod assist, HOB elevated General bed mobility comments: mod A for verbal cues to appropriate placement of LUE (pt dependently movign LUE with RUE), and mod A for physically assisting pt with BLE off of the bed and pushing into sitting     Transfers   Overall transfer level: Needs assistance Equipment used: Rolling walker (2 wheeled) Transfer via Lift Equipment: Stedy Transfers: Sit to/from Stand, Squat Pivot Transfers Sit to Stand: Mod assist (+1) Stand pivot transfers: Mod assist (+1)  Lateral/Scoot Transfers: Min assist, +2 physical assistance, +2 safety/equipment General transfer comment: Pt completed 10x sit<>stand with +1 assist ranging from minA-Mod A for L knee blocking and phsyical lifting. he required mod A for squat pivot +1 for verbal cues of hand placement and phsyical assist of L side.     Ambulation / Gait / Stairs / Wheelchair Mobility   Ambulation/Gait Ambulation/Gait assistance: Max assist, +2 physical  assistance Gait Distance (Feet): 2 Feet Assistive device: 4-wheeled walker Gait Pattern/deviations: Step-to pattern, Decreased stance time - left, Decreased step length - left, Decreased dorsiflexion - left General Gait Details: worked on sequencing steps, pt dependent of PT to hold L hand on walker, PT with tactile cues and modA to advance L LE, cues at knee to flex during swing phase and complete quad set in stand phase Gait velocity: slow Gait velocity interpretation: <1.8 ft/sec, indicate of risk for recurrent falls     Posture / Balance Dynamic Sitting Balance Sitting balance - Comments: Able to maintain static sitting with supervision but at times did lose balance when reaching to grab bedrails with RUE. Balance Overall balance assessment: Needs assistance Sitting-balance support: Feet supported Sitting balance-Leahy Scale: Fair Sitting balance - Comments: Able to maintain static sitting with supervision but at times did lose balance when reaching to grab bedrails with RUE. Postural control: Left lateral lean Standing balance support: Bilateral upper extremity supported, During functional activity Standing balance-Leahy Scale: Poor Standing balance comment: pt stood with +1 assist with blocking of L knee; weight shifting/stepping in standing required +2 assist     Special needs/care consideration N/a    Previous Home Environment (from acute therapy documentation) Living Arrangements: Spouse/significant other, Children (1 and 3 yo)  Lives With: Spouse Available Help at Discharge: Family, Available 24 hours/day Type of Home: House Home Layout: One level Home Access: Stairs to enter Entrance Stairs-Rails: Left Entrance Stairs-Number of Steps: 4 Bathroom Shower/Tub: Tub/shower unit, Curtain Bathroom Toilet: Standard Additional Comments: 2 children - 1 and 3 years old   Discharge Living Setting Plans for Discharge Living Setting: Patient's home, Lives with (comment) (spouse,  [name], and 2 children) Type of Home at Discharge: House Discharge Home Layout: One level Discharge Home Access: Stairs to enter Entrance Stairs-Rails: Left Entrance Stairs-Number of Steps: 4 Discharge Bathroom Shower/Tub: Tub/shower unit Discharge Bathroom Toilet: Standard Discharge Bathroom Accessibility: No Does the patient have any problems obtaining your medications?: No   Social/Family/Support Systems Patient Roles: Spouse, Parent Contact Information: pt has a 3 y/o and a 37 y/o at home Anticipated Caregiver: [name] Rho Anticipated Caregiver's Contact Information: [phone] Ability/Limitations of Caregiver: min assist Caregiver Availability: 24/7 Discharge Plan Discussed with Primary Caregiver: Yes Is Caregiver In Agreement with Plan?: Yes Does Caregiver/Family have Issues with Lodging/Transportation while Pt is in Rehab?: No   Goals Patient/Family Goal for Rehab: PT/OT min assist, SLP supervision to min assist Expected length of stay: 24-28 days Pt/Family Agrees to Admission and willing   to participate: Yes Program Orientation Provided & Reviewed with Pt/Caregiver Including Roles  & Responsibilities: Yes  Barriers to Discharge: Insurance for SNF coverage, Home environment access/layout   Decrease burden of Care through IP rehab admission: n/a   Possible need for SNF placement upon discharge: Not anticipated   Patient Condition: I have reviewed medical records from [place], spoken with CM, and patient and spouse. I met with patient at the bedside for inpatient rehabilitation assessment.  Patient will benefit from ongoing PT, OT, and SLP, can actively participate in 3 hours of therapy a day 5 days of the week, and can make measurable gains during the admission.  Patient will also benefit from the coordinated team approach during an Inpatient Acute Rehabilitation admission.  The patient will receive intensive therapy as well as Rehabilitation physician, nursing, social  worker, and care management interventions.  Due to bladder management, bowel management, safety, skin/wound care, disease management, medication administration, pain management, and patient education the patient requires 24 hour a day rehabilitation nursing.  The patient is currently min to mod +2 with mobility and basic ADLs.  Discharge setting and therapy post discharge at home with home health is anticipated.  Patient has agreed to participate in the Acute Inpatient Rehabilitation Program and will admit today.   Preadmission Screen Completed By:  Caitlin E Warren, PT, DPT 02/22/2021 11:36 AM ______________________________________________________________________   Discussed status with Dr. Louisiana Searles on 02/22/21  at 11:36 AM  and received approval for admission today.   Admission Coordinator:  Caitlin E Warren, PT, DPT time 11:36 AM /Date 02/22/21     Assessment/Plan: Diagnosis:R PCA infarct d/t Right ICA dissection Does the need for close, 24 hr/day Medical supervision in concert with the patient's rehab needs make it unreasonable for this patient to be served in a less intensive setting? Yes Co-Morbidities requiring supervision/potential complications: PSA, migraines, obesity Due to bladder management, bowel management, safety, skin/wound care, disease management, medication administration, pain management, and patient education, does the patient require 24 hr/day rehab nursing? Yes Does the patient require coordinated care of a physician, rehab nurse, PT, OT, and SLP to address physical and functional deficits in the context of the above medical diagnosis(es)? Yes Addressing deficits in the following areas: balance, endurance, locomotion, strength, transferring, bowel/bladder control, bathing, dressing, feeding, grooming, toileting, cognition, speech, and psychosocial support Can the patient actively participate in an intensive therapy program of at least 3 hrs of therapy 5 days a week? Yes The  potential for patient to make measurable gains while on inpatient rehab is excellent Anticipated functional outcomes upon discharge from inpatient rehab: min assist PT, min assist OT, supervision and min assist SLP Estimated rehab length of stay to reach the above functional goals is: 24-28 days Anticipated discharge destination: Home 10. Overall Rehab/Functional Prognosis: excellent     MD Signature: Raymond Azure T. Aleaha Fickling, MD, FAAPMR [facility] 02/22/2021  

## 2021-02-16 NOTE — Progress Notes (Signed)
Orthopedic Tech Progress Note Patient Details:  David Craig [birth year] [id number]  Ortho Devices Type of Ortho Device: Prafo boot/shoe Ortho Device/Splint Location: lle. Ortho Device/Splint Interventions: Ordered, Application   Post Interventions Patient Tolerated: Well  Al Decant 02/16/2021, 3:31 PM

## 2021-02-16 NOTE — Progress Notes (Signed)
Physical Therapy Treatment Patient Details Name: David Craig MRN: [id number] DOB: December 15, 1985 Today's Date: 02/16/2021   History of Present Illness David Craig is a 35 y.o. male admitted s/p fall in shower with left side weakness. MRI; acute right PCA stroke with additional right carotid terminus thrombus without MCA territory ischemia. PHMx:polysubstance abuse (cocaine, tobacco, vaping, oxycodone), migraine headaches.    PT Comments    Patient progressing towards physical therapy goals. Patient with active movement of L LE with gravity eliminated primarily. Patient able to extend L knee in standing with Stedy. Patient required minA+2 for sit to stand with Stedy x 5. Worked on weight shifting L/R and patient able to lift R foot off ground briefly. Continue to recommend comprehensive inpatient rehab (CIR) for post-acute therapy needs.     Recommendations for follow up therapy are one component of a multi-disciplinary discharge planning process, led by the attending physician.  Recommendations may be updated based on patient status, additional functional criteria and insurance authorization.  Follow Up Recommendations  CIR     Equipment Recommendations  Other (comment) (TBD)    Recommendations for Other Services       Precautions / Restrictions Precautions Precautions: Fall Precaution Comments: L hemi, L homonymous hemianopsia Restrictions Weight Bearing Restrictions: No     Mobility  Bed Mobility Overal bed mobility: Needs Assistance Bed Mobility: Rolling;Sidelying to Sit Rolling: Mod assist Sidelying to sit: Min assist;+2 for physical assistance Supine to sit:  (A for LLE)     General bed mobility comments: Patient able to advance R LE off bed and more assist with trunk elevation today. Requires assistance for bringing L LE off bed.    Transfers Overall transfer level: Needs assistance Equipment used: Ambulation equipment used Transfers: Sit to/from Stand Sit to  Stand: Min assist;+2 physical assistance         General transfer comment: with use of Stedy, patient able to stand with minA+2 x 5 during session. Leans to L in standing and sitting. Patient able to straighten L knee for short bouts. Cues for upright posture throughout. Worked on weight shifting in the stedy from side to side. Patient able to clear R foot off ground x 5.  Ambulation/Gait             General Gait Details: unable at this time   Stairs             Wheelchair Mobility    Modified Rankin (Stroke Patients Only) Modified Rankin (Stroke Patients Only) Pre-Morbid Rankin Score: No symptoms Modified Rankin: Severe disability     Balance Overall balance assessment: Needs assistance Sitting-balance support: Single extremity supported;Feet supported Sitting balance-Leahy Scale: Poor Sitting balance - Comments: left lateral lean is tendency   Standing balance support: Bilateral upper extremity supported Standing balance-Leahy Scale: Poor Standing balance comment: requires +2 assist fort sit<>stand with min-mod A  to maintain standing                            Cognition Arousal/Alertness: Lethargic Behavior During Therapy: Flat affect;Impulsive Overall Cognitive Status: Impaired/Different from baseline Area of Impairment: Attention;Following commands;Safety/judgement;Awareness;Problem solving                   Current Attention Level: Sustained Memory: Decreased short-term memory Following Commands: Follows one step commands with increased time Safety/Judgement: Decreased awareness of safety;Decreased awareness of deficits Awareness: Emergent Problem Solving: Decreased initiation;Difficulty sequencing;Requires verbal cues General Comments: kept eyes open more this  session ~75% of time. Lethargic throughout but following commands consistently with increased time      Exercises Other Exercises Other Exercises: Pt able to show some  active movement in LUE for internal rotation; also showing associated reactions with yawning and stretching. Educated wife on LUE exercises.    General Comments        Pertinent Vitals/Pain Pain Assessment: No/denies pain Faces Pain Scale: No hurt    Home Living                      Prior Function            PT Goals (current goals can now be found in the care plan section) Acute Rehab PT Goals Patient Stated Goal: to be able to walk PT Goal Formulation: With patient/family Time For Goal Achievement: 03/01/21 Potential to Achieve Goals: Good Progress towards PT goals: Progressing toward goals    Frequency    Min 4X/week      PT Plan Current plan remains appropriate    Co-evaluation PT/OT/SLP Co-Evaluation/Treatment: Yes Reason for Co-Treatment: Necessary to address cognition/behavior during functional activity;For patient/therapist safety PT goals addressed during session: Mobility/safety with mobility;Balance;Strengthening/ROM OT goals addressed during session: Strengthening/ROM      AM-PAC PT "6 Clicks" Mobility   Outcome Measure  Help needed turning from your back to your side while in a flat bed without using bedrails?: A Lot Help needed moving from lying on your back to sitting on the side of a flat bed without using bedrails?: Total Help needed moving to and from a bed to a chair (including a wheelchair)?: Total Help needed standing up from a chair using your arms (e.g., wheelchair or bedside chair)?: Total Help needed to walk in hospital room?: Total Help needed climbing 3-5 steps with a railing? : Total 6 Click Score: 7    End of Session Equipment Utilized During Treatment: Gait belt Activity Tolerance: Patient limited by lethargy Patient left: in chair;with call bell/phone within reach;with chair alarm set;with family/visitor present Nurse Communication: Mobility status PT Visit Diagnosis: Unsteadiness on feet (R26.81);Muscle weakness  (generalized) (M62.81);Difficulty in walking, not elsewhere classified (R26.2)     Time: 7322-0254 PT Time Calculation (min) (ACUTE ONLY): 28 min  Charges:  $Neuromuscular Re-education: 8-22 mins                     Farah Lepak A. Dan Humphreys PT, DPT Acute Rehabilitation Services Pager [phone] Office [phone]    Viviann Spare 02/16/2021, 2:18 PM

## 2021-02-16 NOTE — Progress Notes (Signed)
I called the ortho tech at this time for the patients L ortho boot.   Sherral Hammers RN

## 2021-02-16 NOTE — Evaluation (Signed)
Speech Language Pathology Evaluation Patient Details Name: David Craig MRN: [id number] DOB: Mar 28, 1986 Today's Date: 02/16/2021 Time: 1249-1300 SLP Time Calculation (min) (ACUTE ONLY): 11 min  Problem List:  Patient Active Problem List   Diagnosis Date Noted   Right carotid artery occlusion 02/14/2021   Past Medical History: No past medical history on file. Past Surgical History:  Past Surgical History:  Procedure Laterality Date   IR ANGIO INTRA EXTRACRAN SEL COM CAROTID INNOMINATE UNI L MOD SED  02/15/2021   IR ANGIO VERTEBRAL SEL VERTEBRAL BILAT MOD SED  02/15/2021   IR CT HEAD LTD  02/15/2021   IR PERCUTANEOUS ART THROMBECTOMY/INFUSION INTRACRANIAL INC DIAG ANGIO  02/15/2021   IR US GUIDE VASC ACCESS RIGHT  02/15/2021   RADIOLOGY WITH ANESTHESIA N/A 02/14/2021   Procedure: IR WITH ANESTHESIA;  Surgeon: Julieanne Cotton, MD;  Location: [facility] OR;  Service: Radiology;  Laterality: N/A;   HPI:  35 y.o. male admitted s/p fall in shower with left side weakness. MRI: acute right PCA stroke with additional right carotid terminus thrombus without MCA territory ischemia. ETT for IR procedure (9/14- 9/15) PHMx: polysubstance abuse (cocaine, tobacco, vaping, oxycodone), migraine headaches.   Assessment / Plan / Recommendation Clinical Impression  Speech-language-cognitive assessment abbreviated by scheduled evaluation for possible PFO with neurologist. Wife did not state differences in his cognition and pt denied deficits. He was awake, but drowsy and kept eyes open on request with significant left neglect. Able to bring eyes to midline. Informal assessment initiated with subtests from various evaluations. Deficits revealed in working memory (recalled 2/4 words independently, 2/4 needing choice cues), awareness, higher level problem solving. Divergent naming was within normal limits. ST needed for further diagnostic treatment and intervention. Recommend CIR.    SLP Assessment  SLP  Recommendation/Assessment: Patient needs continued Speech Lanaguage Pathology Services SLP Visit Diagnosis: Cognitive communication deficit (R41.841)    Recommendations for follow up therapy are one component of a multi-disciplinary discharge planning process, led by the attending physician.  Recommendations may be updated based on patient status, additional functional criteria and insurance authorization.    Follow Up Recommendations  Inpatient Rehab    Frequency and Duration min 2x/week  2 weeks      SLP Evaluation Cognition  Overall Cognitive Status: Impaired/Different from baseline Arousal/Alertness: Awake/alert (adequately awake) Orientation Level: Oriented to person;Oriented to place;Oriented to time;Oriented to situation Attention: Sustained Sustained Attention: Impaired Sustained Attention Impairment: Verbal basic Memory: Impaired Memory Impairment: Storage deficit;Retrieval deficit Awareness: Impaired Awareness Impairment: Anticipatory impairment;Emergent impairment Problem Solving:  (will assess higher level) Behaviors: Impulsive Safety/Judgment: Other (comment) (suspect impaired)       Comprehension  Auditory Comprehension Overall Auditory Comprehension: Appears within functional limits for tasks assessed Visual Recognition/Discrimination Discrimination: Not tested Reading Comprehension Reading Status: Not tested    Expression Expression Primary Mode of Expression: Verbal Verbal Expression Overall Verbal Expression: Appears within functional limits for tasks assessed Pragmatics: Impairment Impairments: Eye contact Written Expression Dominant Hand: Right Written Expression: Not tested   Oral / Motor  Oral Motor/Sensory Function Overall Oral Motor/Sensory Function: Mild impairment Facial ROM: Reduced left;Suspected CN VII (facial) dysfunction Facial Symmetry: Abnormal symmetry left;Suspected CN VII (facial) dysfunction Facial Sensation: Reduced  left;Suspected CN V (Trigeminal) dysfunction Lingual ROM: Within Functional Limits Lingual Symmetry: Abnormal symmetry left;Suspected CN XII (hypoglossal) dysfunction Motor Speech Overall Motor Speech: Appears within functional limits for tasks assessed Intelligibility: Intelligible Motor Planning: Witnin functional limits   GO  [name] 02/16/2021, 2:40 PM  Breck Coons Lonell Face.Ed Nurse, children's [phone] Office [phone]

## 2021-02-16 NOTE — Progress Notes (Signed)
Occupational Therapy Treatment Patient Details Name: Chaynce Schafer MRN: [id number] DOB: [birth year] Today's Date: 02/16/2021   History of present illness Levie Wages is a 35 y.o. male admitted s/p fall in shower with left side weakness. MRI; acute right PCA stroke with additional right carotid terminus thrombus without MCA territory ischemia. PHMx:polysubstance abuse (cocaine, tobacco, vaping, oxycodone), migraine headaches.   OT comments  Pt making progress today with bed mobility, sit<>stand, maintaining standing, movement in LUE, and assessing vision deficits (left homonymous hemianopsia) as well as decreased ability to shift and maintain eyes to left--all precursors to functional ADLs.He will continue to benefit from acute OT with follow up on CIR.   Recommendations for follow up therapy are one component of a multi-disciplinary discharge planning process, led by the attending physician.  Recommendations may be updated based on patient status, additional functional criteria and insurance authorization.    Follow Up Recommendations  CIR;Supervision/Assistance - 24 hour    Equipment Recommendations  Other (comment) (TBD next venue)       Precautions / Restrictions Precautions Precautions: Fall Precaution Comments: L hemi, L honoymous hemianopsia Restrictions Weight Bearing Restrictions: No       Mobility Bed Mobility Overal bed mobility: Needs Assistance Bed Mobility: Rolling;Sidelying to Sit Rolling: Mod assist (A for LLE and LUE) Sidelying to sit: Min assist;+2 for physical assistance Supine to sit:  (A for LLE)     General bed mobility comments: Patient able to advance R LE off bed and more assist with trunk elevation today. Requires assistance for bringing L LE off bed.    Transfers Overall transfer level: Needs assistance   Transfers: Sit to/from Stand Sit to Stand: Min assist;+2 physical assistance         General transfer comment: with sara stedy Fleet was able  to stand with min A +2 (x5)with tendency to lean to left side. While in standing he was able to straighten left knee for short periods of time, stand up tall, and quickly lift RLE slightly off of foot plate(x5) of sara stedy. Total A to hold LUE on sara stedy bar.    Balance Overall balance assessment: Needs assistance Sitting-balance support: Single extremity supported;Feet supported Sitting balance-Leahy Scale: Poor Sitting balance - Comments: left lateral lean is tendency   Standing balance support: Bilateral upper extremity supported Standing balance-Leahy Scale: Poor Standing balance comment: requires +2 assist fort sit<>stand with min-mod A  to maintain standing                               Vision Baseline Vision/History: 0 No visual deficits Vision Assessment?: Yes Eye Alignment:  (looks a bit dysconjugate, but he does not report any double vison) Ocular Range of Motion: Restricted on the left (He can look all the way left but really has to concentrate on doing this) Tracking/Visual Pursuits: Decreased smoothness of eye movement to LEFT superior field Saccades:  (worked on looking between two objects, really has to concentrate on looking left with eyes only to find object) Convergence: Impaired (comment) Visual Fields: Left homonymous hemianopsia Additional Comments: Educated wife on working on his vision looking back and forth between two items (one on left and one on right)          Cognition Arousal/Alertness: Lethargic (but more awake than yesterday, kept eyes open 75% of session today)   Overall Cognitive Status: Impaired/Different from baseline Area of Impairment: Attention;Following commands;Safety/judgement;Awareness;Problem solving  Current Attention Level: Sustained   Following Commands: Follows one step commands with increased time Safety/Judgement: Decreased awareness of safety;Decreased awareness of deficits (but not as  impulsive as yesterday) Awareness: Emergent Problem Solving: Decreased initiation;Difficulty sequencing;Requires verbal cues General Comments: Better about waiting today before moving        Exercises Other Exercises Other Exercises: Pt able to show some active movement in LUE for internal rotation; also showing associated reactions with yawning and stretching. Educated wife on LUE exercises.           Pertinent Vitals/ Pain       Pain Assessment: No/denies pain Faces Pain Scale: No hurt         Frequency  Min 2X/week        Progress Toward Goals  OT Goals(current goals can now be found in the care plan section)  Progress towards OT goals: Progressing toward goals  Acute Rehab OT Goals Patient Stated Goal: to be able to walk OT Goal Formulation: With patient Time For Goal Achievement: 03/01/21 Potential to Achieve Goals: Good  Plan Discharge plan remains appropriate;Frequency remains appropriate    Co-evaluation    PT/OT/SLP Co-Evaluation/Treatment: Yes Reason for Co-Treatment: Necessary to address cognition/behavior during functional activity;For patient/therapist safety PT goals addressed during session: Mobility/safety with mobility;Strengthening/ROM OT goals addressed during session: Strengthening/ROM      AM-PAC OT "6 Clicks" Daily Activity     Outcome Measure   Help from another person eating meals?: A Little (setup) Help from another person taking care of personal grooming?: A Lot Help from another person toileting, which includes using toliet, bedpan, or urinal?: A Lot Help from another person bathing (including washing, rinsing, drying)?: A Lot Help from another person to put on and taking off regular upper body clothing?: A Lot Help from another person to put on and taking off regular lower body clothing?: Total 6 Click Score: 12    End of Session Equipment Utilized During Treatment: Gait belt  OT Visit Diagnosis: Unsteadiness on feet  (R26.81);Other abnormalities of gait and mobility (R26.89);Muscle weakness (generalized) (M62.81);Other symptoms and signs involving cognitive function;Hemiplegia and hemiparesis Hemiplegia - Right/Left: Left Hemiplegia - dominant/non-dominant: Non-Dominant Hemiplegia - caused by: Cerebral infarction   Activity Tolerance Patient tolerated treatment well   Patient Left in chair;with call bell/phone within reach;with chair alarm set;with family/visitor present   Nurse Communication Mobility status;Need for lift equipment (sara stedy, please get back to bed after lunch and back up for super, up over weekend for meals, needs a PRAFO for LLE)        Time: 2694-8546 OT Time Calculation (min): 36 min  Charges: OT General Charges $OT Visit: 1 Visit OT Treatments $Therapeutic Activity: 8-22 mins  Ignacia Palma, OTR/L Acute [employer] Pager [phone] Office [phone]    Evette Georges 02/16/2021, 11:21 AM

## 2021-02-17 DIAGNOSIS — F17200 Nicotine dependence, unspecified, uncomplicated: Secondary | ICD-10-CM

## 2021-02-17 DIAGNOSIS — G8194 Hemiplegia, unspecified affecting left nondominant side: Secondary | ICD-10-CM

## 2021-02-17 DIAGNOSIS — I639 Cerebral infarction, unspecified: Secondary | ICD-10-CM

## 2021-02-17 DIAGNOSIS — F1410 Cocaine abuse, uncomplicated: Secondary | ICD-10-CM

## 2021-02-17 LAB — BASIC METABOLIC PANEL
Anion gap: 8 (ref 5–15)
BUN: 11 mg/dL (ref 6–20)
CO2: 25 mmol/L (ref 22–32)
Calcium: 8.8 mg/dL — ABNORMAL LOW (ref 8.9–10.3)
Chloride: 106 mmol/L (ref 98–111)
Creatinine, Ser: 0.68 mg/dL (ref 0.61–1.24)
GFR, Estimated: >60 mL/min (ref >60)
Glucose, Bld: 143 mg/dL — ABNORMAL HIGH (ref 70–99)
Potassium: 3.5 mmol/L (ref 3.5–5.1)
Sodium: 139 mmol/L (ref 135–145)

## 2021-02-17 LAB — BETA-2-GLYCOPROTEIN I ABS, IGG/M/A
Beta-2 Glyco I IgG: <9 GPI IgG units (ref 0–20)
Beta-2-Glycoprotein I IgA: <9 GPI IgA units (ref 0–25)
Beta-2-Glycoprotein I IgM: <9 GPI IgM units (ref 0–32)

## 2021-02-17 LAB — CBC
HCT: 43.9 % (ref 39.0–52.0)
Hemoglobin: 15.6 g/dL (ref 13.0–17.0)
MCH: 31.3 pg (ref 26.0–34.0)
MCHC: 35.5 g/dL (ref 30.0–36.0)
MCV: 88.2 fL (ref 80.0–100.0)
Platelets: 146 10*3/uL — ABNORMAL LOW (ref 150–400)
RBC: 4.98 MIL/uL (ref 4.22–5.81)
RDW: 12.5 % (ref 11.5–15.5)
WBC: 8.5 10*3/uL (ref 4.0–10.5)
nRBC: 0 % (ref 0.0–0.2)

## 2021-02-17 MED ORDER — DOCUSATE SODIUM 100 MG PO CAPS
100.0000 mg | ORAL_CAPSULE | Freq: Two times a day (BID) | ORAL | Status: DC
  Administered 2021-02-17 – 2021-02-22 (×6): 100 mg via ORAL
  Filled 2021-02-17 (×6): qty 1

## 2021-02-17 MED ORDER — ASPIRIN EC 325 MG PO TBEC
325.0000 mg | DELAYED_RELEASE_TABLET | Freq: Every day | ORAL | Status: DC
  Administered 2021-02-18 – 2021-02-22 (×5): 325 mg via ORAL
  Filled 2021-02-17 (×5): qty 1

## 2021-02-17 NOTE — Progress Notes (Signed)
Physical Therapy Treatment Patient Details Name: David Craig MRN: [id number] DOB: 1986-01-31 Today's Date: 02/17/2021   History of Present Illness David Craig is a 35 y.o. male admitted s/p fall in shower with left side weakness. MRI; acute right PCA stroke with additional right carotid terminus thrombus without MCA territory ischemia. PHMx:polysubstance abuse (cocaine, tobacco, vaping, oxycodone), migraine headaches.    PT Comments    Pt making steady progress with PT. Working on balance, transfers and pregait activities. Continues to need cues for cognitive deficits. Continue to recommend CIR at DC.    Recommendations for follow up therapy are one component of a multi-disciplinary discharge planning process, led by the attending physician.  Recommendations may be updated based on patient status, additional functional criteria and insurance authorization.  Follow Up Recommendations  CIR     Equipment Recommendations  Wheelchair (measurements PT);Wheelchair cushion (measurements PT)    Recommendations for Other Services       Precautions / Restrictions Precautions Precautions: Fall Precaution Comments: L hemi, L homonymous hemianopsia     Mobility  Bed Mobility Overal bed mobility: Needs Assistance Bed Mobility: Rolling;Sidelying to Sit Rolling: Mod assist Sidelying to sit: Min assist;+2 for physical assistance;HOB elevated       General bed mobility comments: Assist to bring LLE off bed, elevate trunk into sitting, and bring hips to EOB.    Transfers Overall transfer level: Needs assistance Equipment used: Ambulation equipment used;2 person hand held assist;Rolling walker (2 wheeled) Transfers: Sit to/from Stand Sit to Stand: Min assist;+2 physical assistance         General transfer comment: Assist for balance and safety with sit to stand. Stood with Stedy x 4, rolling walker x 3, and hand held x 2. Verbal cues for hand placement. With walker and hand held needed  some assist to bring hips up as well as balance.  Ambulation/Gait             General Gait Details: Worked on pregait activities. In [place] worked on wt shifting and extending lt hip and knee with incr weight bearing. With walker hand pt use walker in RUE only and worked on wt shift to lt with hip/knee extension and very small steps with rt foot. Pt with +2 mod assist for stepping with rt foot. Stood with hand held on rt and 2nd person providing support to lt hip and knee to facilitate extension with small step forward and back with rt foot. Stood for 1-3 minutes each time.   Stairs             Wheelchair Mobility    Modified Rankin (Stroke Patients Only)       Balance Overall balance assessment: Needs assistance Sitting-balance support: No upper extremity supported Sitting balance-Leahy Scale: Fair Sitting balance - Comments: Able to maintain static sitting with supervision   Standing balance support: Single extremity supported Standing balance-Leahy Scale: Poor Standing balance comment: UE support and min assist for static standing                            Cognition Arousal/Alertness: Awake/alert Behavior During Therapy: Impulsive Overall Cognitive Status: Impaired/Different from baseline Area of Impairment: Attention;Safety/judgement;Awareness;Problem solving                   Current Attention Level: Sustained Memory: Decreased short-term memory   Safety/Judgement: Decreased awareness of safety;Decreased awareness of deficits Awareness: Emergent Problem Solving: Requires verbal cues;Difficulty sequencing;Requires tactile cues General Comments:  Pt impulsive and needed verbal cues to wait until we were ready to move.      Exercises      General Comments        Pertinent Vitals/Pain Pain Assessment: No/denies pain    Home Living                      Prior Function            PT Goals (current goals can now be found in  the care plan section) Acute Rehab PT Goals Patient Stated Goal: to be able to walk Progress towards PT goals: Progressing toward goals    Frequency    Min 4X/week      PT Plan Current plan remains appropriate    Co-evaluation              AM-PAC PT "6 Clicks" Mobility   Outcome Measure  Help needed turning from your back to your side while in a flat bed without using bedrails?: A Lot Help needed moving from lying on your back to sitting on the side of a flat bed without using bedrails?: Total Help needed moving to and from a bed to a chair (including a wheelchair)?: Total Help needed standing up from a chair using your arms (e.g., wheelchair or bedside chair)?: Total Help needed to walk in hospital room?: Total Help needed climbing 3-5 steps with a railing? : Total 6 Click Score: 7    End of Session Equipment Utilized During Treatment: Gait belt Activity Tolerance: Patient tolerated treatment well Patient left: in chair;with call bell/phone within reach;with chair alarm set;with family/visitor present Nurse Communication: Mobility status;Need for lift equipment PT Visit Diagnosis: Unsteadiness on feet (R26.81);Muscle weakness (generalized) (M62.81);Difficulty in walking, not elsewhere classified (R26.2)     Time: 1140-1210 PT Time Calculation (min) (ACUTE ONLY): 30 min  Charges:  $Therapeutic Exercise: 23-37 mins                     [facility] PT Acute Rehabilitation Services Pager [phone] Office [phone]    Angelina Ok [facility] 02/17/2021, 1:45 PM

## 2021-02-17 NOTE — Progress Notes (Addendum)
STROKE TEAM PROGRESS NOTE   INTERVAL HISTORY His wife is at the bedside. No acute events. He is waiting on a floor bed and is medically ready for rehab.  Drowsy but easily arousable to alert  and oriented state.  Left leg weakness improving. Wife to bring in [facility] insurance card for inpatient rehab. She had many questions today. We discussed his diagnosis, work up, and plan of care. We reviewed imaging with her and answered all her questions.  Vitals:   02/17/21 0500 02/17/21 0600 02/17/21 0700 02/17/21 0800  BP: (!) 132/96 113/75 139/85   Pulse: 64 61 63   Resp: _0 Temp:    98.3 F (36.8 C)  TempSrc:    Oral  SpO2: 94% 95% 96%   Weight:      Height:       CBC:  Recent Labs  Lab 02/14/21 2150 02/14/21 2209 02/16/21 0148 02/17/21 0316  WBC 13.9*  --  11.9* 8.5  NEUTROABS 11.9*  --   --   --   HGB 17.9*   < > 15.0 15.6  HCT 50.4   < > 43.7 43.9  MCV 87.5  --  88.8 88.2  PLT 176  --  151 146*   < > = values in this interval not displayed.   Basic Metabolic Panel:  Recent Labs  Lab 02/16/21 0148 02/17/21 0316  NA 138 139  K 3.2* 3.5  CL 100 106  CO2 28 25  GLUCOSE 102* 143*  BUN 12 11  CREATININE 0.81 0.68  CALCIUM 9.0 8.8*  MG 2.2  --    Lipid Panel:  Recent Labs  Lab 02/15/21 0243  CHOL 197  TRIG 190*  HDL 33*  CHOLHDL 6.0  VLDL 38  LDLCALC 126*   HgbA1c:  Recent Labs  Lab 02/15/21 0243  HGBA1C 5.0   Urine Drug Screen:  Recent Labs  Lab 02/15/21 0243  LABOPIA NONE DETECTED  COCAINSCRNUR POSITIVE*  LABBENZ NONE DETECTED  AMPHETMU NONE DETECTED  THCU POSITIVE*  LABBARB NONE DETECTED    Alcohol Level  Recent Labs  Lab 02/14/21 2150  ETH <10    IMAGING past 24 hours VAS [place] TRANSCRANIAL DOPPLER W BUBBLES  Result Date: 02/16/2021  Transcranial Doppler with Bubble Patient Name:  David Craig  Date of Exam:   02/16/2021 Medical Rec #: [id number]     Accession #:    [id number] Date of Birth: 1985-06-27     Patient Gender: M Patient Age:    35 years Exam Location:  [facility] Procedure:      VAS [place] TRANSCRANIAL DOPPLER W BUBBLES Referring Phys: Charlene Brooke --------------------------------------------------------------------------------  Indications: Stroke. Comparison Study: No prior studies. Performing Technologist: Oliver Hum RVT  Examination Guidelines: A complete evaluation includes B-mode imaging, spectral Doppler, color Doppler, and power Doppler as needed of all accessible portions of each vessel. Bilateral testing is considered an integral part of a complete examination. Limited examinations for reoccurring indications may be performed as noted.  Summary:  A vascular evaluation was performed. The right middle cerebral artery was studied. An IV was inserted into the patient's right Cephalic vein. Verbal informed consent was obtained.  Less than five HITS noted. Spencer grade 1 insignificant PFO. *See table(s) above for TCD measurements and observations.    Preliminary    VAS [place] LOWER EXTREMITY VENOUS (DVT)  Result Date: 02/16/2021  Lower Venous DVT Study Patient Name:  David Craig  Date of Exam:   02/16/2021  Medical Rec #: [id number]     Accession #:    [id number] Date of Birth: [birth year]     Patient Gender: M Patient Age:   68 years Exam Location:  [facility] Procedure:      VAS [place] LOWER EXTREMITY VENOUS (DVT) Referring Phys: PRAMOD SETHI --------------------------------------------------------------------------------  Indications: Stroke.  Risk Factors: None identified. Limitations: Poor ultrasound/tissue interface and patient positioning. Comparison Study: No prior studies. Performing Technologist: Oliver Hum RVT  Examination Guidelines: A complete evaluation includes B-mode imaging, spectral Doppler, color Doppler, and power Doppler as needed of all accessible portions of each vessel. Bilateral testing is considered an integral part of a complete examination. Limited examinations for reoccurring  indications may be performed as noted. The reflux portion of the exam is performed with the patient in reverse Trendelenburg.  +---------+---------------+---------+-----------+----------+--------------+ RIGHT    CompressibilityPhasicitySpontaneityPropertiesThrombus Aging +---------+---------------+---------+-----------+----------+--------------+ CFV      Full           Yes      Yes                                 +---------+---------------+---------+-----------+----------+--------------+ SFJ      Full                                                        +---------+---------------+---------+-----------+----------+--------------+ FV Prox  Full                                                        +---------+---------------+---------+-----------+----------+--------------+ FV Mid   Full                                                        +---------+---------------+---------+-----------+----------+--------------+ FV DistalFull                                                        +---------+---------------+---------+-----------+----------+--------------+ PFV      Full                                                        +---------+---------------+---------+-----------+----------+--------------+ POP      Full           Yes      Yes                                 +---------+---------------+---------+-----------+----------+--------------+ PTV      Full                                                        +---------+---------------+---------+-----------+----------+--------------+  PERO     Full                                                        +---------+---------------+---------+-----------+----------+--------------+   +---------+---------------+---------+-----------+----------+--------------+ LEFT     CompressibilityPhasicitySpontaneityPropertiesThrombus Aging  +---------+---------------+---------+-----------+----------+--------------+ CFV      Full           Yes      Yes                                 +---------+---------------+---------+-----------+----------+--------------+ SFJ      Full                                                        +---------+---------------+---------+-----------+----------+--------------+ FV Prox  Full                                                        +---------+---------------+---------+-----------+----------+--------------+ FV Mid   Full                                                        +---------+---------------+---------+-----------+----------+--------------+ FV DistalFull                                                        +---------+---------------+---------+-----------+----------+--------------+ PFV      Full                                                        +---------+---------------+---------+-----------+----------+--------------+ POP      Full           Yes      Yes                                 +---------+---------------+---------+-----------+----------+--------------+ PTV      Full                                                        +---------+---------------+---------+-----------+----------+--------------+ PERO     Full                                                        +---------+---------------+---------+-----------+----------+--------------+  Summary: RIGHT: - There is no evidence of deep vein thrombosis in the lower extremity.  - No cystic structure found in the popliteal fossa.  LEFT: - There is no evidence of deep vein thrombosis in the lower extremity.  - No cystic structure found in the popliteal fossa.  *See table(s) above for measurements and observations.    Preliminary     PHYSICAL EXAM Well developed male not in distress. Afebrile. Head is nontraumatic. Neck is supple without bruit.  Resp: even and unlabored Card: SR on  cardiac monitor   Neurological Exam  Awake alert oriented to time and place.  Right gaze preference but able to look to the left past midline.  Dense left homonymous hemianopsia.  Left lower facial moderate weakness.  Tongue midline.  Left hemiplegia improving with some antigravity distally in LLE.  Flaccidity and hypotonia on the left.  Good antigravity strength on the right.  Diminished left hemibody sensation but no neglect.  Gait deferred  ASSESSMENT/PLAN [name] is a 35 y.o. male with history of polysubstance abuse (cocaine, THC, opiates, vaping) presenting with left hemiparesis and right gaze preference, found to have right PCA territory ischemic infarcts. Out of window for alteplase. 1 pass endovascular treatment, TICI 0. Procedure aborted due to robust collateral flow to MCA territory from left side circulation. Patient has fetal PCAs, explaining PCA stroke from anterior circulation thrombus.   Stroke  right PCA infarct, secondary to terminal right ICA occlusion s/p failed mechanical thrombectomy etiology likely secondary to cocaine use/vasospasm Code Stroke CT head No acute abnormality.. ASPECTS 10.  CTA head & neck : questionable right ICA dissection at cavernous segment with more distal siphon occlusion, right proximal Pcomm and right proximal P2 occlusions  CT perfusion large area of penumbra without large core  Cerebral angio no discernable dissection flap but with decreased caliber right cervical ICA. Left to right perfusion of right sided MAC territory branches without MCA branch occlusion and no proximal occlusion of right PCA  MRI  multifocal ischemic infarcts of right PCA territory involving occipital, medical temporal, posterior limb of internal capsule, and ventrolateral thalamic regions.  MRA , after angio right P1 occlusion , right Ica occlusion  2D Echo LVEF 65-70%, no regional wall motion abnormalities, mild LVH  Transcranial Doppler study reveals grade 1 PFO Lower  extremity venous Dopplers negative for DVT LDL 126. Placed on high intensity statin, not home medication, atorv 80 qd  HgbA1c 5.0 VTE prophylaxis - enoxaparin 40 qd     Diet   Diet regular Room service appropriate? Yes; Fluid consistency: Thin   No antithrombotic prior to admission, now on aspirin 81 mg daily and clopidogrel 75 mg daily. X 90 days, then switch to monotherapy with asa alone. Therapy recommendations:  SLP dysphagia 2 diet, CIR OT/PT recs , needs 24 hour supervision  Disposition:  CIR, insurance card to be brought in by wife. He is medically ready for CIR.   Hypertension Home meds:  none  Stable off cleviprex Long-term BP goal normotensive  Hyperlipidemia Home meds:  none, added atorvastatin 80 mg LDL 126, goal < 70 Continue statin at discharge  Other Stroke Risk Factors Cigarette smoker, vapes, THC, cocaine, advised to stop using  ETOH use, alcohol level <10, advised to drink no more than 1-2 drink(s) a day Substance abuse - UDS:  THC POSITIVE, Cocaine POSITIVE. Patient advised to stop using due to stroke risk. Migraines: advised against triptans   Other Active Problems Dysphagia: SLP following, passed for  dysphagia 2 diet Respiratory failure in setting of acute ischemic infarct: extubated successfully and now resolved.   This patient was seen and evaluated with Dr. Erlinda Hong. He directed the plan of care.   Charlene Brooke, NP-C  Hospital day # 3  ATTENDING NOTE: I reviewed above note and agree with the assessment and plan. Pt was seen and examined.   35 year old male with history of substance abuse, smoker admitted for left-sided weakness, right-sided gaze.  CT no acute abnormality.  CTA head and neck showed right ICA segment occlusion, right P-comm and right P2 occlusion.  CT perfusion positive for penumbra.  Status post IR without recanalization given concerning of dissection and right MCA patent.  However, MRI showed right MCA scattered infarct, largest at  right BG/CR and optic radiation.  MRA showed right siphon and right P1 occlusion.  EF 65 to 70%, TCD bubble study Spencer degree 1 at most.  Hypercoagulable work-up pending, A1c 5.0, LDL 126.  UDS showed positive for cocaine and THC.  Creatinine 0.81, WBC 11.9.  On exam, wife at the bedside, pt drowsy sleepy but eyes open on voice and following all simple commands, orientated to age, place, time and people. No aphasia, fluent language, following all simple commands. Mild dysarthria. Able to name and repeat. No gaze palsy, however, left hemianopia. Left facial droop and tongue protrusion to the left. RUE and RLE 5/5, LUE 0/5 and LLE 2-/5. Sensation symmetrical bilaterally, right FTN intact, gait not tested.   Etiology for patient stroke not quite clear, however concerning for substance abuse with positive for cocaine as well as risk factors including smoking cigarettes.  Cocaine and cigarette cessation education provided.  Continue aspirin 325 and Plavix 75 DAPT for 3 months and then aspirin alone.  Continue statin.  PT/OT recommend CIR.  For detailed assessment and plan, please refer to above as I have made changes wherever appropriate.   Rosalin Hawking, MD PhD Stroke Neurology 02/17/2021 7:47 PM  This patient is critically ill due to right MCA stroke, cocaine abuse, severe left hemiplegia, right ICA terminal occlusion and at significant risk of neurological worsening, death form recurrent stroke, hemorrhagic conversion, seizure. This patient's care requires constant monitoring of vital signs, hemodynamics, respiratory and cardiac monitoring, review of multiple databases, neurological assessment, discussion with family, other specialists and medical decision making of high complexity. I spent 45 minutes of neurocritical care time in the care of this patient. I had long discussion with wife at bedside, updated pt current condition, treatment plan and potential prognosis, and answered all the questions.  She  expressed understanding and appreciation.      To contact Stroke Continuity provider, please refer to [email or web address] After hours, contact General Neurology

## 2021-02-18 LAB — BASIC METABOLIC PANEL
Anion gap: 10 (ref 5–15)
BUN: 12 mg/dL (ref 6–20)
CO2: 26 mmol/L (ref 22–32)
Calcium: 9 mg/dL (ref 8.9–10.3)
Chloride: 104 mmol/L (ref 98–111)
Creatinine, Ser: 0.68 mg/dL (ref 0.61–1.24)
GFR, Estimated: >60 mL/min (ref >60)
Glucose, Bld: 112 mg/dL — ABNORMAL HIGH (ref 70–99)
Potassium: 3.8 mmol/L (ref 3.5–5.1)
Sodium: 140 mmol/L (ref 135–145)

## 2021-02-18 LAB — CBC
HCT: 44.7 % (ref 39.0–52.0)
Hemoglobin: 15.9 g/dL (ref 13.0–17.0)
MCH: 31 pg (ref 26.0–34.0)
MCHC: 35.6 g/dL (ref 30.0–36.0)
MCV: 87.1 fL (ref 80.0–100.0)
Platelets: 144 10*3/uL — ABNORMAL LOW (ref 150–400)
RBC: 5.13 MIL/uL (ref 4.22–5.81)
RDW: 12.1 % (ref 11.5–15.5)
WBC: 7.5 10*3/uL (ref 4.0–10.5)
nRBC: 0 % (ref 0.0–0.2)

## 2021-02-18 NOTE — Progress Notes (Addendum)
STROKE TEAM PROGRESS NOTE   INTERVAL HISTORY His wife is at the bedside. No acute events. He is waiting on a floor bed and is medically ready for rehab.  Eyes open today, more alert and interactive. We again discussed plan of care and rehab plan. Wife's questions were answered. She reports patient has been asking to go home with her as if does not have insight into his deficits. Vitals:   02/18/21 0600 02/18/21 0800 02/18/21 0900 02/18/21 1000  BP:  (!) 142/100 (!) 140/102 (!) 134/91  Pulse: (!) 59 (!) 57 63 (!) 59  Resp:  _0 Temp:  98.1 F (36.7 C)    TempSrc:  Oral    SpO2: 92% 93% 96% 94%  Weight:      Height:       CBC:  Recent Labs  Lab 02/14/21 2150 02/14/21 2209 02/17/21 0316 02/18/21 0325  WBC 13.9*   < > 8.5 7.5  NEUTROABS 11.9*  --   --   --   HGB 17.9*   < > 15.6 15.9  HCT 50.4   < > 43.9 44.7  MCV 87.5   < > 88.2 87.1  PLT 176   < > 146* 144*   < > = values in this interval not displayed.   Basic Metabolic Panel:  Recent Labs  Lab 02/16/21 0148 02/17/21 0316 02/18/21 0325  NA 138 139 140  K 3.2* 3.5 3.8  CL 100 106 104  CO2 _1 GLUCOSE 102* 143* 112*  BUN _2 CREATININE 0.81 0.68 0.68  CALCIUM 9.0 8.8* 9.0  MG 2.2  --   --    Lipid Panel:  Recent Labs  Lab 02/15/21 0243  CHOL 197  TRIG 190*  HDL 33*  CHOLHDL 6.0  VLDL 38  LDLCALC 126*   HgbA1c:  Recent Labs  Lab 02/15/21 0243  HGBA1C 5.0   Urine Drug Screen:  Recent Labs  Lab 02/15/21 0243  LABOPIA NONE DETECTED  COCAINSCRNUR POSITIVE*  LABBENZ NONE DETECTED  AMPHETMU NONE DETECTED  THCU POSITIVE*  LABBARB NONE DETECTED    Alcohol Level  Recent Labs  Lab 02/14/21 2150  ETH <10    IMAGING past 24 hours No results found.  PHYSICAL EXAM Well developed male not in distress. Afebrile. Head is nontraumatic. Neck is supple without bruit.  Resp: even and unlabored Card: SR on cardiac monitor   Neurological Exam  Awake and alert.   Right gaze  preference but able to look to the left past midline.  Dense left homonymous hemianopsia.  Left lower facial moderate weakness. Tongue midline.  Left hemiplegia improving with some antigravity distally in LLE.  Flaccidity and hypotonia on the left UE.   Good antigravity strength on the right.  Diminished left hemibody sensation but no neglect.  Gait deferred  ASSESSMENT/PLAN [name] is a 35 y.o. male with history of polysubstance abuse (cocaine, THC, opiates, vaping) presenting with left hemiparesis and right gaze preference, found to have right PCA territory ischemic infarcts. Out of window for alteplase. 1 pass endovascular treatment, TICI 0. Procedure aborted due to robust collateral flow to MCA territory from left side circulation. Patient has fetal PCAs, explaining PCA stroke from anterior circulation thrombus.   Stroke  right PCA infarct, secondary to terminal right ICA occlusion s/p failed mechanical thrombectomy etiology likely secondary to cocaine use/vasospasm Code Stroke CT head No acute abnormality.. ASPECTS 10.  CTA head & neck : questionable  right ICA dissection at cavernous segment with more distal siphon occlusion, right proximal Pcomm and right proximal P2 occlusions  CT perfusion large area of penumbra without large core  Cerebral angio no discernable dissection flap but with decreased caliber right cervical ICA. Left to right perfusion of right sided MAC territory branches without MCA branch occlusion and no proximal occlusion of right PCA  MRI  multifocal ischemic infarcts of right PCA territory involving occipital, medical temporal, posterior limb of internal capsule, and ventrolateral thalamic regions.  MRA , after angio right P1 occlusion , right Ica occlusion  2D Echo LVEF 65-70%, no regional wall motion abnormalities, mild LVH  Transcranial Doppler study reveals grade 1 PFO Lower extremity venous Dopplers negative for DVT LDL 126. Placed on high intensity statin, not  home medication, atorv 80 qd  HgbA1c 5.0 VTE prophylaxis - enoxaparin 40 qd     Diet   Diet regular Room service appropriate? Yes; Fluid consistency: Thin   No antithrombotic prior to admission, now on aspirin 81 mg daily and clopidogrel 75 mg daily. X 90 days, then switch to monotherapy with asa alone. Therapy recommendations:  SLP dysphagia 2 diet, CIR OT/PT recs , needs 24 hour supervision  Disposition:  CIR, insurance card to be brought in by wife. He is medically ready for CIR.   Hypertension Home meds:  none  Stable off cleviprex Long-term BP goal normotensive  Hyperlipidemia Home meds:  none, added atorvastatin 80 mg LDL 126, goal < 70 Continue statin at discharge  Other Stroke Risk Factors Cigarette smoker, vapes, THC, cocaine, advised to stop using  ETOH use, alcohol level <10, advised to drink no more than 1-2 drink(s) a day Substance abuse - UDS:  THC POSITIVE, Cocaine POSITIVE. Patient advised to stop using due to stroke risk. Migraines: advised against triptans   Other Active Problems Dysphagia: SLP following, passed for dysphagia 2 diet Respiratory failure in setting of acute ischemic infarct: extubated successfully and now resolved.   This patient was seen and evaluated with Dr. Erlinda Craig. He directed the plan of care.   David Brooke, NP-C  Hospital day # 4  ATTENDING NOTE: I reviewed above note and agree with the assessment and plan. Pt was seen and examined.   Wife at bedside.  Patient lying in bed, awake alert, no acute event overnight.  Left upper extremity still flaccid, left lower extremity however 2/5 proximal, 3/5 knee extension.  Had long discussion with wife for aggressive exercises and working hard with PT/OT.  Pending CIR.  For detailed assessment and plan, please refer to above as I have made changes wherever appropriate.   David Hawking, MD PhD Stroke Neurology 02/18/2021 7:54 PM      To contact Stroke Continuity provider, please refer to  [email or web address] After hours, contact General Neurology

## 2021-02-19 LAB — CBC
HCT: 46.1 % (ref 39.0–52.0)
Hemoglobin: 16 g/dL (ref 13.0–17.0)
MCH: 30.5 pg (ref 26.0–34.0)
MCHC: 34.7 g/dL (ref 30.0–36.0)
MCV: 87.8 fL (ref 80.0–100.0)
Platelets: 163 10*3/uL (ref 150–400)
RBC: 5.25 MIL/uL (ref 4.22–5.81)
RDW: 12.1 % (ref 11.5–15.5)
WBC: 8.6 10*3/uL (ref 4.0–10.5)
nRBC: 0 % (ref 0.0–0.2)

## 2021-02-19 LAB — BASIC METABOLIC PANEL
Anion gap: 10 (ref 5–15)
BUN: 15 mg/dL (ref 6–20)
CO2: 23 mmol/L (ref 22–32)
Calcium: 8.9 mg/dL (ref 8.9–10.3)
Chloride: 102 mmol/L (ref 98–111)
Creatinine, Ser: 0.67 mg/dL (ref 0.61–1.24)
GFR, Estimated: >60 mL/min (ref >60)
Glucose, Bld: 102 mg/dL — ABNORMAL HIGH (ref 70–99)
Potassium: 3.8 mmol/L (ref 3.5–5.1)
Sodium: 135 mmol/L (ref 135–145)

## 2021-02-19 LAB — FACTOR 5 LEIDEN

## 2021-02-19 NOTE — Progress Notes (Signed)
Physical Therapy Treatment Patient Details Name: David Craig MRN: [id number] DOB: [birth year] Today's Date: 02/19/2021   History of Present Illness David Craig is a 35 y.o. male admitted s/p fall in shower with left side weakness. MRI; acute right PCA stroke with additional right carotid terminus thrombus without MCA territory ischemia. PHMx:polysubstance abuse (cocaine, tobacco, vaping, oxycodone), migraine headaches.    PT Comments    Pt progressing slowly towards all goals. Pt very motivated and eager to go to rehab. Pt remains to have L sided neglect and hemiplegia requiring assistx2 for sit to stand and ambulation. Pt unable to sequence or advance L LE without assist. This session focused on increasing L sided WBing to promote sensation and muscle stimulation in L LE while OT worked at sink with pt. Acute PT to cont to follow.    Recommendations for follow up therapy are one component of a multi-disciplinary discharge planning process, led by the attending physician.  Recommendations may be updated based on patient status, additional functional criteria and insurance authorization.  Follow Up Recommendations  CIR     Equipment Recommendations  Wheelchair (measurements PT);Wheelchair cushion (measurements PT)    Recommendations for Other Services Rehab consult     Precautions / Restrictions Precautions Precautions: Fall Precaution Comments: L hemi, L homonymous hemianopsia Restrictions Weight Bearing Restrictions: No     Mobility  Bed Mobility Overal bed mobility: Needs Assistance Bed Mobility: Supine to Sit     Supine to sit: Mod assist;HOB elevated     General bed mobility comments: Pt used bed rails on the R to pull self to EOB with R hand.  Pt sat up too quickly.   Reminded him the slower he sits up the less dizzy he may become. Pt very impulsive with mobility. Pt in front to prevent pt from sliding off bed due to pt momentum    Transfers Overall transfer level:  Needs assistance Equipment used: Ambulation equipment used;2 person hand held assist;Rolling walker (2 wheeled) Transfers: Sit to/from [employer] Sit to Stand: Min assist;+2 physical assistance Stand pivot transfers: Mod assist;+2 physical assistance       General transfer comment: Pt impulsive with standing. Requires cues to push up from the bed.  Used walker for today and requires assist to hold to walker on the left.  Ambulation/Gait Ambulation/Gait assistance: Max assist;+2 physical assistance Gait Distance (Feet): 2 Feet Assistive device: 4-wheeled walker Gait Pattern/deviations: Step-to pattern;Decreased stance time - left;Decreased step length - left;Decreased dorsiflexion - left Gait velocity: slow Gait velocity interpretation: <1.8 ft/sec, indicate of risk for recurrent falls General Gait Details: worked on sequencing steps, pt dependent of PT to hold L hand on walker, PT with tactile cues and modA to advance L LE, cues at knee to flex during swing phase and complete quad set in stand phase   Stairs             Wheelchair Mobility    Modified Rankin (Stroke Patients Only) Modified Rankin (Stroke Patients Only) Pre-Morbid Rankin Score: No symptoms Modified Rankin: Severe disability     Balance Overall balance assessment: Needs assistance Sitting-balance support: Single extremity supported;Feet supported Sitting balance-Leahy Scale: Fair Sitting balance - Comments: Able to maintain static sitting with supervision but at times did lose balance when reaching to grab bedrails with RUE.  Encourated pt to bear weight through the bed and not grab for bedrails. Postural control: Left lateral lean Standing balance support: Bilateral upper extremity supported;During functional activity Standing balance-Leahy Scale: Poor Standing  balance comment: PT worked on tactile cues at L LE while OT worked with pt standing at sink brushing teeth. With tactile  stimulation at quad pt would complete quad set but uanble to maintain, tactile cues at L gluts to promote hip extension                            Cognition Arousal/Alertness: Awake/alert Behavior During Therapy: Impulsive (but responded well to PT?OT correction) Overall Cognitive Status: Impaired/Different from baseline Area of Impairment: Attention;Safety/judgement;Awareness;Problem solving                   Current Attention Level: Sustained Memory: Decreased short-term memory Following Commands: Follows one step commands consistently Safety/Judgement: Decreased awareness of safety;Decreased awareness of deficits Awareness: Emergent Problem Solving: Requires verbal cues;Difficulty sequencing;Requires tactile cues;Decreased initiation General Comments: pt with noted L sided neglect requring max verbal cues to find L UE and LE and turn head to the L      Exercises Other Exercises Other Exercises: worked on standing and increasing WBing on L UE and LE i standing    General Comments General comments (skin integrity, edema, etc.): pt very motivated and eager to get better, VSS      Pertinent Vitals/Pain Pain Assessment: No/denies pain    Home Living                      Prior Function            PT Goals (current goals can now be found in the care plan section) Acute Rehab PT Goals Patient Stated Goal: to be able to walk PT Goal Formulation: With patient/family Time For Goal Achievement: 03/01/21 Potential to Achieve Goals: Good Progress towards PT goals: Progressing toward goals    Frequency    Min 4X/week      PT Plan Current plan remains appropriate    Co-evaluation PT/OT/SLP Co-Evaluation/Treatment: Yes Reason for Co-Treatment: Complexity of the patient's impairments (multi-system involvement) PT goals addressed during session: Mobility/safety with mobility OT goals addressed during session: ADL's and self-care      AM-PAC PT  "6 Clicks" Mobility   Outcome Measure  Help needed turning from your back to your side while in a flat bed without using bedrails?: A Lot Help needed moving from lying on your back to sitting on the side of a flat bed without using bedrails?: Total Help needed moving to and from a bed to a chair (including a wheelchair)?: Total Help needed standing up from a chair using your arms (e.g., wheelchair or bedside chair)?: Total Help needed to walk in hospital room?: Total Help needed climbing 3-5 steps with a railing? : Total 6 Click Score: 7    End of Session Equipment Utilized During Treatment: Gait belt Activity Tolerance: Patient tolerated treatment well Patient left: in chair;with call bell/phone within reach;with chair alarm set;with family/visitor present Nurse Communication: Mobility status;Need for lift equipment PT Visit Diagnosis: Unsteadiness on feet (R26.81);Muscle weakness (generalized) (M62.81);Difficulty in walking, not elsewhere classified (R26.2)     Time: 1062-6948 PT Time Calculation (min) (ACUTE ONLY): 41 min  Charges:  $Gait Training: 8-22 mins $Neuromuscular Re-education: 8-22 mins                     Lewis Shock, PT, DPT Acute Rehabilitation Services Pager #: [phone] Office #: [phone]    Iona Hansen 02/19/2021, 2:42 PM

## 2021-02-19 NOTE — Progress Notes (Signed)
Occupational Therapy Treatment Patient Details Name: Newton Frutiger MRN: [id number] DOB: September 28, 1985 Today's Date: 02/19/2021   History of present illness Kailyn Dubie is a 35 y.o. male admitted s/p fall in shower with left side weakness. MRI; acute right PCA stroke with additional right carotid terminus thrombus without MCA territory ischemia. PHMx:polysubstance abuse (cocaine, tobacco, vaping, oxycodone), migraine headaches.   OT comments  Pt making progress with all adls and adl transfers. Pt continues to have L hemiplegia and L homonomous hemianopsia which affects safety with adls.  Pt requiring max assist for most adls in standing and min to mod when sitting. Pts wife motivated to assist.  Feel rehab continues to be best option to maximize pt's independence given his young age and previous level of functioning.    Recommendations for follow up therapy are one component of a multi-disciplinary discharge planning process, led by the attending physician.  Recommendations may be updated based on patient status, additional functional criteria and insurance authorization.    Follow Up Recommendations  CIR;Supervision/Assistance - 24 hour    Equipment Recommendations  Other (comment) (tbd)    Recommendations for Other Services      Precautions / Restrictions Precautions Precautions: Fall Precaution Comments: L hemi, L homonymous hemianopsia       Mobility Bed Mobility Overal bed mobility: Needs Assistance Bed Mobility: Supine to Sit     Supine to sit: Mod assist;HOB elevated     General bed mobility comments: Pt used bed rails to get to EOB.  Pt sat up too quickly.   Reminded him the slower he sits up the less dizzy he may become. Pt very impulsive with mobility.    Transfers Overall transfer level: Needs assistance Equipment used: Ambulation equipment used;2 person hand held assist;Rolling walker (2 wheeled) Transfers: Sit to/from [employer] Sit to Stand: Min  assist;+2 physical assistance Stand pivot transfers: Mod assist;+2 physical assistance       General transfer comment: Pt impulsive with standing. Requires cues to push up from the bed.  Used walker for today and requires assist to hold to walker on the left.    Balance Overall balance assessment: Needs assistance Sitting-balance support: Single extremity supported;Feet supported Sitting balance-Leahy Scale: Fair Sitting balance - Comments: Able to maintain static sitting with supervision but at times did lose balance when reaching to grab bedrails with RUE.  Encourated pt to bear weight through the bed and not grab for bedrails.   Standing balance support: Bilateral upper extremity supported;During functional activity Standing balance-Leahy Scale: Poor Standing balance comment: pt must have outside support to stand.                           ADL either performed or assessed with clinical judgement   ADL Overall ADL's : Needs assistance/impaired Eating/Feeding: Minimal assistance;Cueing for compensatory techinques;Sitting Eating/Feeding Details (indicate cue type and reason): assist to cut and set up tray.  Cues to find items on L of tray and not pocket on the left. Grooming: Wash/dry hands;Oral care;Moderate assistance;Standing;Cueing for compensatory techniques Grooming Details (indicate cue type and reason): mod assist x2 to stand at sink to groom.  PT under sink addressing weightbearing and activitation of LLE while OT standing with pt to weightbear through LUE and use compensation techniques to brush teeth with RUE.             Lower Body Dressing: Total assistance Lower Body Dressing Details (indicate cue type and reason): pt  donned R sock with mod assist for balance and L sock with mod assist x2 to remain sitting and don sock. Toilet Transfer: Moderate assistance;+2 for physical assistance;BSC;RW Toilet Transfer Details (indicate cue type and reason): stand pivot  with walker focusing on weight shifting. Toileting- Clothing Manipulation and Hygiene: Total assistance       Functional mobility during ADLs: Maximal assistance;+2 for physical assistance General ADL Comments: Pt ovearll requires mod assist with some adls up to total assist when in standing. Pt limited by vision/perception deficits, hemiplegia and cognitive deficits.     Vision   Vision Assessment?: Yes Eye Alignment: Within Functional Limits Ocular Range of Motion: Restricted on the left Tracking/Visual Pursuits: Decreased smoothness of eye movement to LEFT superior field Convergence: Impaired (comment) Visual Fields: Left homonymous hemianopsia Additional Comments: decreased vision significantly impairs safety with all adls.   Perception     Praxis      Cognition Arousal/Alertness: Awake/alert Behavior During Therapy: Impulsive Overall Cognitive Status: Impaired/Different from baseline Area of Impairment: Attention;Safety/judgement;Awareness;Problem solving                   Current Attention Level: Sustained Memory: Decreased short-term memory Following Commands: Follows one step commands consistently Safety/Judgement: Decreased awareness of safety;Decreased awareness of deficits Awareness: Emergent Problem Solving: Requires verbal cues;Difficulty sequencing;Requires tactile cues General Comments: Pt continues to be highly impulsive and is a fall risk when left alone.        Exercises     Shoulder Instructions       General Comments Pt very motivated and follows instructions well.  Pt would benefit from rehab stay to maximize indepenence before returning home with his wife.    Pertinent Vitals/ Pain       Pain Assessment: No/denies pain  Home Living                                          Prior Functioning/Environment              Frequency  Min 2X/week        Progress Toward Goals  OT Goals(current goals can now be  found in the care plan section)  Progress towards OT goals: Progressing toward goals  Acute Rehab OT Goals Patient Stated Goal: to be able to walk OT Goal Formulation: With patient Time For Goal Achievement: 03/01/21 Potential to Achieve Goals: Good ADL Goals Pt Will Perform Grooming: with min assist;sitting Pt Will Perform Upper Body Bathing: with min assist;sitting Pt Will Perform Upper Body Dressing: with mod assist;sitting Pt Will Transfer to Toilet: with min assist;squat pivot transfer;bedside commode Pt/caregiver will Perform Home Exercise Program: Increased ROM;Independently;Left upper extremity;With written HEP provided Additional ADL Goal #1: Pt will be S up to EOB and back to supine from EOB Additional ADL Goal #2: Pt will show safety with all tasks Additional ADL Goal #3: Pt will be able to locate items on his left without cued 75% of time.  Plan Discharge plan remains appropriate;Frequency remains appropriate    Co-evaluation    PT/OT/SLP Co-Evaluation/Treatment: Yes Reason for Co-Treatment: Complexity of the patient's impairments (multi-system involvement);To address functional/ADL transfers PT goals addressed during session: Mobility/safety with mobility OT goals addressed during session: ADL's and self-care      AM-PAC OT "6 Clicks" Daily Activity     Outcome Measure   Help from another person eating meals?: A Little  Help from another person taking care of personal grooming?: A Lot Help from another person toileting, which includes using toliet, bedpan, or urinal?: A Lot Help from another person bathing (including washing, rinsing, drying)?: A Lot Help from another person to put on and taking off regular upper body clothing?: A Lot Help from another person to put on and taking off regular lower body clothing?: Total 6 Click Score: 12    End of Session Equipment Utilized During Treatment: Gait belt  OT Visit Diagnosis: Unsteadiness on feet (R26.81);Other  abnormalities of gait and mobility (R26.89);Muscle weakness (generalized) (M62.81);Other symptoms and signs involving cognitive function;Hemiplegia and hemiparesis Hemiplegia - Right/Left: Left Hemiplegia - dominant/non-dominant: Non-Dominant Hemiplegia - caused by: Cerebral infarction   Activity Tolerance Patient tolerated treatment well   Patient Left in chair;with call bell/phone within reach;with chair alarm set;with family/visitor present   Nurse Communication Mobility status;Other (comment) (needs to be up for meals)        Time: 8341-9622 OT Time Calculation (min): 40 min  Charges: OT General Charges $OT Visit: 1 Visit OT Treatments $Self Care/Home Management : 8-22 mins   Hope Budds 02/19/2021, 12:20 PM

## 2021-02-19 NOTE — Progress Notes (Addendum)
STROKE TEAM PROGRESS NOTE   INTERVAL HISTORY His wife is at the bedside. No acute events. He is waiting on a floor bed. Today he continues to improved with alertness and left  sided strength. Wife is working with him between therapy visits to improve his strength. She is very supportive, attentive and involved.   We discussed need for TEE for further source evaluation which can be done at the earliest on Wednesday per cards master. They agree to proceed with planning for TEE.  We discussed his status, need for rehab ASAP after TEE. Her questions were answered. David Craig was attentive but did not have questions.  Vitals:   02/19/21 0050 02/19/21 0055 02/19/21 0400 02/19/21 0818  BP:   129/84 133/89  Pulse: 64   65  Resp: _0 Temp:   98.1 F (36.7 C) 98.2 F (36.8 C)  TempSrc:   Oral Oral  SpO2: 98%   94%  Weight:      Height:       CBC:  Recent Labs  Lab 02/14/21 2150 02/14/21 2209 02/18/21 0325 02/19/21 0333  WBC 13.9*   < > 7.5 8.6  NEUTROABS 11.9*  --   --   --   HGB 17.9*   < > 15.9 16.0  HCT 50.4   < > 44.7 46.1  MCV 87.5   < > 87.1 87.8  PLT 176   < > 144* 163   < > = values in this interval not displayed.   Basic Metabolic Panel:  Recent Labs  Lab 02/16/21 0148 02/17/21 0316 02/18/21 0325 02/19/21 0333  NA 138   < > 140 135  K 3.2*   < > 3.8 3.8  CL 100   < > 104 102  CO2 28   < > 26 23  GLUCOSE 102*   < > 112* 102*  BUN 12   < > 12 15  CREATININE 0.81   < > 0.68 0.67  CALCIUM 9.0   < > 9.0 8.9  MG 2.2  --   --   --    < > = values in this interval not displayed.   Lipid Panel:  Recent Labs  Lab 02/15/21 0243  CHOL 197  TRIG 190*  HDL 33*  CHOLHDL 6.0  VLDL 38  LDLCALC 126*   HgbA1c:  Recent Labs  Lab 02/15/21 0243  HGBA1C 5.0   Urine Drug Screen:  Recent Labs  Lab 02/15/21 0243  LABOPIA NONE DETECTED  COCAINSCRNUR POSITIVE*  LABBENZ NONE DETECTED  AMPHETMU NONE DETECTED  THCU POSITIVE*  LABBARB NONE DETECTED    Alcohol Level   Recent Labs  Lab 02/14/21 2150  ETH <10    IMAGING past 24 hours No results found.  PHYSICAL EXAM Well developed male not in distress. Afebrile. Head is nontraumatic. Neck is supple without bruit.  Resp: even and unlabored Card: SR on cardiac monitor  Extremities:   Neurological Exam  Awake and alert.  Right gaze preference but able to look to the left past midline.  Dense left homonymous hemianopsia.  Left lower facial moderate weakness. Tongue midline.  Left hemiplegia improving with some antigravity distally in LLE.  Flaccidity and hypotonia on the left UE.   Good antigravity strength on the right.  Diminished left hemibody sensation but no neglect.  Gait deferred  ASSESSMENT/PLAN David Craig is a 35 y.o. male with history of polysubstance abuse (cocaine, THC, opiates, vaping) presenting with left hemiparesis and right  gaze preference, found to have right PCA territory ischemic infarcts. Out of window for alteplase. 1 pass endovascular treatment, TICI 0. Procedure aborted due to robust collateral flow to MCA territory from left side circulation. Patient has fetal PCAs, explaining PCA stroke from anterior circulation thrombus.   Stroke  right PCA infarct, secondary to terminal right ICA occlusion s/p failed mechanical thrombectomy etiology likely secondary to cocaine use/vasospasm Code Stroke CT head No acute abnormality.. ASPECTS 10.  CTA head & neck : questionable right ICA dissection at cavernous segment with more distal siphon occlusion, right proximal Pcomm and right proximal P2 occlusions  CT perfusion large area of penumbra without large core  Cerebral angio no discernable dissection flap but with decreased caliber right cervical ICA. Left to right perfusion of right sided MAC territory branches without MCA branch occlusion and no proximal occlusion of right PCA  MRI  multifocal ischemic infarcts of right PCA territory involving occipital, medical temporal, posterior limb  of internal capsule, and ventrolateral thalamic regions.  MRA , after angio right P1 occlusion , right Ica occlusion  2D Echo LVEF 65-70%, no regional wall motion abnormalities, mild LVH  Transcranial Doppler study reveals grade 1 PFO Hypercoag work up PENDING TEE requested as soon as possible. Advised Wednesday was earliest date available. PENDING Lower extremity venous Dopplers negative for DVT LDL 126. Placed on high intensity statin, not on home medication, atorv 80 qd  HgbA1c 5.0 VTE prophylaxis - enoxaparin 40 qd     Diet   Diet regular Room service appropriate? Yes; Fluid consistency: Thin   No antithrombotic prior to admission, now on aspirin 81 mg daily and clopidogrel 75 mg daily. X 90 days, then switch to monotherapy with asa alone. Therapy recommendations:  SLP dysphagia 2 diet, CIR OT/PT recs , needs 24 hour supervision  Disposition:  CIR, insurance card to be brought in by wife. He is medically ready for CIR but awaiting TEE  Hypertension Home meds:  none  Stable off cleviprex Long-term BP goal normotensive  Hyperlipidemia Home meds:  none, added atorvastatin 80 mg LDL 126, goal < 70 Continue statin at discharge  Other Stroke Risk Factors Cigarette smoker, vapes, THC, cocaine, advised to stop using  ETOH use, alcohol level <10, advised to drink no more than 1-2 drink(s) a day Substance abuse - UDS:  THC POSITIVE, Cocaine POSITIVE. Patient advised to stop using due to stroke risk. Migraines: advised against triptans   Other Active Problems Dysphagia: SLP following, passed for dysphagia 2 diet Respiratory failure in setting of acute ischemic infarct: extubated successfully and now resolved.   This patient was seen and evaluated with Dr. Erlinda Hong. He directed the plan of care.   Charlene Brooke, NP-C  Hospital day # 5  ATTENDING NOTE: I reviewed above note and agree with the assessment and plan. Pt was seen and examined.   Wife at bedside.  No acute event  overnight, left upper and lower extremity weakness continues to improve.  Currently left lower extremity 3/5, left upper extremity 2-/5 proximal, still 0/5 distally.  Encourage patient self exercises after PT/OT session.  Given young age, would like to have TEE for further stroke work-up. Plan for TEE Wednesday, then CIR after TEE.  For detailed assessment and plan, please refer to above as I have made changes wherever appropriate.   Rosalin Hawking, MD PhD Stroke Neurology 02/19/2021 4:13 PM   To contact Stroke Continuity provider, please refer to [email or web address] After hours, contact General Neurology

## 2021-02-19 NOTE — Progress Notes (Signed)
Inpatient Rehab Admissions Coordinator:   I met with patient and his wife at the bedside.  Pt is alert today, and responding appropriately! He is able to perform a limited SLR on the LLE and attempt shoulder shrug with the LUE.  We discussed plan for TEE Wednesday, with possible transition to CIR following.  Pt's employer has not been able to locate his [employer], and [name] provided me with the number for their HR contact.  I've left a voicemail with her Chrys Racer [phone]) to try and determine which [employer] carrier we are working with and a potential policy number.  Shann Medal, PT, DPT Admissions Coordinator [phone] 02/19/21  12:19 PM

## 2021-02-20 LAB — BASIC METABOLIC PANEL
Anion gap: 11 (ref 5–15)
BUN: 16 mg/dL (ref 6–20)
CO2: 24 mmol/L (ref 22–32)
Calcium: 8.9 mg/dL (ref 8.9–10.3)
Chloride: 104 mmol/L (ref 98–111)
Creatinine, Ser: 0.66 mg/dL (ref 0.61–1.24)
GFR, Estimated: >60 mL/min (ref >60)
Glucose, Bld: 126 mg/dL — ABNORMAL HIGH (ref 70–99)
Potassium: 3.7 mmol/L (ref 3.5–5.1)
Sodium: 139 mmol/L (ref 135–145)

## 2021-02-20 LAB — CBC
HCT: 46.5 % (ref 39.0–52.0)
Hemoglobin: 16 g/dL (ref 13.0–17.0)
MCH: 30.4 pg (ref 26.0–34.0)
MCHC: 34.4 g/dL (ref 30.0–36.0)
MCV: 88.2 fL (ref 80.0–100.0)
Platelets: 168 10*3/uL (ref 150–400)
RBC: 5.27 MIL/uL (ref 4.22–5.81)
RDW: 12.3 % (ref 11.5–15.5)
WBC: 10 10*3/uL (ref 4.0–10.5)
nRBC: 0 % (ref 0.0–0.2)

## 2021-02-20 NOTE — Progress Notes (Signed)
Inpatient Rehab Admissions Coordinator:   Insurance authorization pending for CIR.  Will follow.   Estill Dooms, PT, DPT Admissions Coordinator [phone] 02/20/21  2:40 PM

## 2021-02-20 NOTE — Progress Notes (Addendum)
STROKE TEAM PROGRESS NOTE   INTERVAL HISTORY His wife is at the bedside. No acute events. He is waiting on a floor bed. TEE is scheduled for 02/21/21 and then should be able to proceed to CIR.  Vitals:   02/20/21 0900 02/20/21 1000 02/20/21 1100 02/20/21 1200  BP:      Pulse:      Resp: _0 Temp:      TempSrc:      SpO2:      Weight:      Height:       CBC:  Recent Labs  Lab 02/14/21 2150 02/14/21 2209 02/19/21 0333 02/20/21 0112  WBC 13.9*   < > 8.6 10.0  NEUTROABS 11.9*  --   --   --   HGB 17.9*   < > 16.0 16.0  HCT 50.4   < > 46.1 46.5  MCV 87.5   < > 87.8 88.2  PLT 176   < > 163 168   < > = values in this interval not displayed.   Basic Metabolic Panel:  Recent Labs  Lab 02/16/21 0148 02/17/21 0316 02/19/21 0333 02/20/21 0112  NA 138   < > 135 139  K 3.2*   < > 3.8 3.7  CL 100   < > 102 104  CO2 28   < > 23 24  GLUCOSE 102*   < > 102* 126*  BUN 12   < > 15 16  CREATININE 0.81   < > 0.67 0.66  CALCIUM 9.0   < > 8.9 8.9  MG 2.2  --   --   --    < > = values in this interval not displayed.   Lipid Panel:  Recent Labs  Lab 02/15/21 0243  CHOL 197  TRIG 190*  HDL 33*  CHOLHDL 6.0  VLDL 38  LDLCALC 126*   HgbA1c:  Recent Labs  Lab 02/15/21 0243  HGBA1C 5.0   Urine Drug Screen:  Recent Labs  Lab 02/15/21 0243  LABOPIA NONE DETECTED  COCAINSCRNUR POSITIVE*  LABBENZ NONE DETECTED  AMPHETMU NONE DETECTED  THCU POSITIVE*  LABBARB NONE DETECTED    Alcohol Level  Recent Labs  Lab 02/14/21 2150  ETH <10    IMAGING past 24 hours No results found.  PHYSICAL EXAM Well developed male not in distress. Afebrile. Head is nontraumatic. Neck is supple without bruit.  Resp: even and unlabored Card: SR on cardiac monitor  Extremities:   Neurological Exam  Awake and alert.  Right gaze preference but able to look to the left past midline.  Dense left homonymous hemianopsia.  Left lower facial moderate weakness. Tongue midline.  Left  hemiplegia improving with some antigravity distally in LLE.  Flaccidity and hypotonia on the left UE.   Good antigravity strength on the right.  Diminished left hemibody sensation but no neglect.  Gait deferred  ASSESSMENT/PLAN [name] is a 35 y.o. male with history of polysubstance abuse (cocaine, THC, opiates, vaping) presenting with left hemiparesis and right gaze preference, found to have right PCA territory ischemic infarcts. Out of window for alteplase. 1 pass endovascular treatment, TICI 0. Procedure aborted due to robust collateral flow to MCA territory from left side circulation. Patient has fetal PCAs, explaining PCA stroke from anterior circulation thrombus.   Stroke  right PCA infarct, secondary to terminal right ICA occlusion s/p failed mechanical thrombectomy etiology likely secondary to cocaine use/vasospasm Code Stroke CT head No acute abnormality.. ASPECTS  10.  CTA head & neck : questionable right ICA dissection at cavernous segment with more distal siphon occlusion, right proximal Pcomm and right proximal P2 occlusions  CT perfusion large area of penumbra without large core  Cerebral angio no discernable dissection flap but with decreased caliber right cervical ICA. Left to right perfusion of right sided MAC territory branches without MCA branch occlusion and no proximal occlusion of right PCA  MRI  multifocal ischemic infarcts of right PCA territory involving occipital, medical temporal, posterior limb of internal capsule, and ventrolateral thalamic regions.  MRA , after angio right P1 occlusion , right Ica occlusion  2D Echo LVEF 65-70%, no regional wall motion abnormalities, mild LVH  Transcranial Doppler study reveals grade 1 PFO Hypercoag work up PENDING TEE requested as soon as possible. Advised Wednesday was earliest date available. Scheduled 02/21/21 Lower extremity venous Dopplers negative for DVT LDL 126. Placed on high intensity statin, not on home medication,  atorv 80 qd  HgbA1c 5.0 VTE prophylaxis - enoxaparin 40 qd     Diet   Diet regular Room service appropriate? Yes; Fluid consistency: Thin   No antithrombotic prior to admission, now on aspirin 81 mg daily and clopidogrel 75 mg daily. X 90 days, then switch to monotherapy with asa alone. Therapy recommendations:  SLP dysphagia 2 diet, CIR OT/PT recs , needs 24 hour supervision  Disposition:  CIR, insurance card to be brought in by wife. He is medically ready for CIR but awaiting TEE  Hypertension Home meds:  none  Stable off cleviprex Long-term BP goal normotensive  Hyperlipidemia Home meds:  none, added atorvastatin 80 mg LDL 126, goal < 70 Continue statin at discharge  Other Stroke Risk Factors Cigarette smoker, vapes, THC, cocaine, advised to stop using  ETOH use, alcohol level <10, advised to drink no more than 1-2 drink(s) a day Substance abuse - UDS:  THC POSITIVE, Cocaine POSITIVE. Patient advised to stop using due to stroke risk. Migraines: advised against triptans   Other Active Problems Dysphagia: SLP following, passed for dysphagia 2 diet Respiratory failure in setting of acute ischemic infarct: extubated successfully and now resolved.   This patient was seen and evaluated with Dr. Erlinda Hong. He directed the plan of care.   Laurey Morale, MSN, NP-C [facility] [phone]  Hospital day # 6  ATTENDING NOTE: I reviewed above note and agree with the assessment and plan. Pt was seen and examined.   Wife at bedside, patient lying in bed, still has left sided weakness, left upper extremity still flaccid, left lower extremity 3/5.  Pending TEE tomorrow.  PT/OT recommend CIR.  For detailed assessment and plan, please refer to above as I have made changes wherever appropriate.   Rosalin Hawking, MD PhD Stroke Neurology 02/20/2021 8:03 PM     To contact Stroke Continuity provider, please refer to [email or web address] After hours, contact General Neurology

## 2021-02-20 NOTE — Progress Notes (Signed)
   [facility] has been requested to perform a transesophageal echocardiogram on David Craig for stroke.  After careful review of history and examination, the risks and benefits of transesophageal echocardiogram have been explained including risks of esophageal damage, perforation (1:10,000 risk), bleeding, pharyngeal hematoma as well as other potential complications associated with conscious sedation including aspiration, arrhythmia, respiratory failure and death. Alternatives to treatment were discussed, questions were answered. Patient and wife are willing to proceed.   Procedure is scheduled for tomorrow 02/21/2021 at 9:00am with Dr. Duke Salvia. Will make NPO at midnight and place pre-procedural orders.  Corrin Parker, PA-C 02/20/2021 5:28 PM

## 2021-02-20 NOTE — H&P (View-Only) (Signed)
   [facility] has been requested to perform a transesophageal echocardiogram on David Craig for stroke.  After careful review of history and examination, the risks and benefits of transesophageal echocardiogram have been explained including risks of esophageal damage, perforation (1:10,000 risk), bleeding, pharyngeal hematoma as well as other potential complications associated with conscious sedation including aspiration, arrhythmia, respiratory failure and death. Alternatives to treatment were discussed, questions were answered. Patient and wife are willing to proceed.   Procedure is scheduled for tomorrow 02/21/2021 at 9:00am with Dr. [place]. Will make NPO at midnight and place pre-procedural orders.  Erek Kowal E [place] Hibberd, PA-C 02/20/2021 5:28 PM   

## 2021-02-20 NOTE — Progress Notes (Signed)
Physical Therapy Treatment Patient Details Name: David Craig MRN: [id number] DOB: 1985-09-10 Today's Date: 02/20/2021   History of Present Illness David Craig is a 35 y.o. male admitted s/p fall in shower with left side weakness. MRI; acute right PCA stroke with additional right carotid terminus thrombus without MCA territory ischemia. PHMx:polysubstance abuse (cocaine, tobacco, vaping, oxycodone), migraine headaches.    PT Comments    Pt continues to be motivated and is eager to amb. Pt remains to have severe L sided neglect requiring constant verbal cues to tend to L side. Pt with improved L LE strength however remains to have flaccidity in L UE with exception of minimal L shld mvmt. Focused on standing in RW and increasing L LE WBing and stimulating quad muscle to stabilize knee during stance phase. Pt continues to be appropriate for CIR upon d/c. Acute PT to cont to follow.    Recommendations for follow up therapy are one component of a multi-disciplinary discharge planning process, led by the attending physician.  Recommendations may be updated based on patient status, additional functional criteria and insurance authorization.  Follow Up Recommendations  CIR     Equipment Recommendations  Wheelchair (measurements PT);Wheelchair cushion (measurements PT)    Recommendations for Other Services Rehab consult     Precautions / Restrictions Precautions Precautions: Fall Precaution Comments: L hemi, L homonymous hemianopsia Restrictions Weight Bearing Restrictions: No     Mobility  Bed Mobility Overal bed mobility: Needs Assistance Bed Mobility: Supine to Sit     Supine to sit: Mod assist;HOB elevated     General bed mobility comments: assist for L LE, verbal cues to pay attn to the L UE and not let it drag or get caught, relies heavily on R UE to pull self up via hand rail, modA by PT to block knees to prevent sliding off EOB    Transfers Overall transfer level: Needs  assistance Equipment used: Rolling walker (2 wheeled) Transfers: Sit to/from Stand Sit to Stand: Min assist;+2 physical assistance Stand pivot transfers: Mod assist;+2 physical assistance       General transfer comment: Pt impulsive with standing. Requires cues to push up from the bed.  Used walker for today and requires assist to hold to walker on the left (used ace wrap to hold hand on, would benefit from grip on walker. max directional verbal cues to sequence stepping, maxA for L LE management/advancement  Ambulation/Gait                 Stairs             Wheelchair Mobility    Modified Rankin (Stroke Patients Only) Modified Rankin (Stroke Patients Only) Pre-Morbid Rankin Score: No symptoms Modified Rankin: Severe disability     Balance Overall balance assessment: Needs assistance Sitting-balance support: Single extremity supported;Feet supported Sitting balance-Leahy Scale: Fair     Standing balance support: Bilateral upper extremity supported;During functional activity Standing balance-Leahy Scale: Poor Standing balance comment: With tactile stimulation at quad pt would complete quad set but uanble to maintain, tactile cues at L gluts to promote hip extension                            Cognition Arousal/Alertness: Awake/alert Behavior During Therapy: Impulsive Overall Cognitive Status: Impaired/Different from baseline Area of Impairment: Attention;Safety/judgement;Awareness;Problem solving                   Current Attention Level: Sustained Memory: Decreased  short-term memory Following Commands: Follows one step commands with increased time Safety/Judgement: Decreased awareness of safety;Decreased awareness of deficits (L sided inattention) Awareness: Emergent Problem Solving: Requires verbal cues;Difficulty sequencing;Requires tactile cues;Decreased initiation General Comments: Pt continues to require constant verbal cues to tend  to L side, pt quick to move      Exercises Other Exercises Other Exercises: worked on standing in RW and raising R LE, pt continues to require mod/maxA to prevent buckling of knee, manual stimulation at quad helps remind pt to perform quad set to stabilize L LE prior to picking up R LE, completed 10 reps Other Exercises: worked on AA L hip flexion in standing to prep for advancing/stepping during ambulation    General Comments General comments (skin integrity, edema, etc.): VSS,      Pertinent Vitals/Pain Pain Assessment: No/denies pain    Home Living                      Prior Function            PT Goals (current goals can now be found in the care plan section) Acute Rehab PT Goals Patient Stated Goal: take a shower Progress towards PT goals: Progressing toward goals    Frequency    Min 4X/week      PT Plan Current plan remains appropriate    Co-evaluation              AM-PAC PT "6 Clicks" Mobility   Outcome Measure  Help needed turning from your back to your side while in a flat bed without using bedrails?: A Lot Help needed moving from lying on your back to sitting on the side of a flat bed without using bedrails?: Total Help needed moving to and from a bed to a chair (including a wheelchair)?: Total Help needed standing up from a chair using your arms (e.g., wheelchair or bedside chair)?: Total Help needed to walk in hospital room?: Total Help needed climbing 3-5 steps with a railing? : Total 6 Click Score: 7    End of Session Equipment Utilized During Treatment: Gait belt Activity Tolerance: Patient tolerated treatment well Patient left: in chair;with call bell/phone within reach;with chair alarm set;with family/visitor present Nurse Communication: Mobility status;Need for lift equipment PT Visit Diagnosis: Unsteadiness on feet (R26.81);Muscle weakness (generalized) (M62.81);Difficulty in walking, not elsewhere classified (R26.2)      Time: 5465-6812 PT Time Calculation (min) (ACUTE ONLY): 31 min  Charges:  $Gait Training: 8-22 mins $Neuromuscular Re-education: 8-22 mins                     Lewis Shock, PT, DPT Acute Rehabilitation Services Pager #: [phone] Office #: [phone]    Iona Hansen 02/20/2021, 1:02 PM

## 2021-02-21 ENCOUNTER — Encounter (HOSPITAL_COMMUNITY): Payer: Self-pay | Admitting: Neurology

## 2021-02-21 ENCOUNTER — Inpatient Hospital Stay (HOSPITAL_COMMUNITY): Payer: BC Managed Care – PPO

## 2021-02-21 ENCOUNTER — Inpatient Hospital Stay (HOSPITAL_COMMUNITY): Payer: BC Managed Care – PPO | Admitting: Anesthesiology

## 2021-02-21 ENCOUNTER — Encounter (HOSPITAL_COMMUNITY)
Admission: EM | Disposition: A | Discharge status: Rehabilitation Facility | Payer: Self-pay | Source: Home / Self Care | Attending: Neurology

## 2021-02-21 DIAGNOSIS — I639 Cerebral infarction, unspecified: Secondary | ICD-10-CM

## 2021-02-21 DIAGNOSIS — Q211 Atrial septal defect: Secondary | ICD-10-CM

## 2021-02-21 HISTORY — PX: TEE WITHOUT CARDIOVERSION: SHX5443

## 2021-02-21 HISTORY — PX: BUBBLE STUDY: SHX6837

## 2021-02-21 LAB — CBC
HCT: 45.7 % (ref 39.0–52.0)
Hemoglobin: 16.1 g/dL (ref 13.0–17.0)
MCH: 30.8 pg (ref 26.0–34.0)
MCHC: 35.2 g/dL (ref 30.0–36.0)
MCV: 87.5 fL (ref 80.0–100.0)
Platelets: 162 10*3/uL (ref 150–400)
RBC: 5.22 MIL/uL (ref 4.22–5.81)
RDW: 12.2 % (ref 11.5–15.5)
WBC: 7.3 10*3/uL (ref 4.0–10.5)
nRBC: 0 % (ref 0.0–0.2)

## 2021-02-21 LAB — BASIC METABOLIC PANEL
Anion gap: 9 (ref 5–15)
BUN: 11 mg/dL (ref 6–20)
CO2: 25 mmol/L (ref 22–32)
Calcium: 9.1 mg/dL (ref 8.9–10.3)
Chloride: 105 mmol/L (ref 98–111)
Creatinine, Ser: 0.61 mg/dL (ref 0.61–1.24)
GFR, Estimated: >60 mL/min (ref >60)
Glucose, Bld: 107 mg/dL — ABNORMAL HIGH (ref 70–99)
Potassium: 4.2 mmol/L (ref 3.5–5.1)
Sodium: 139 mmol/L (ref 135–145)

## 2021-02-21 LAB — PROTHROMBIN GENE MUTATION

## 2021-02-21 SURGERY — ECHOCARDIOGRAM, TRANSESOPHAGEAL
Anesthesia: Monitor Anesthesia Care

## 2021-02-21 MED ORDER — PROPOFOL 500 MG/50ML IV EMUL
INTRAVENOUS | Status: DC | PRN
  Administered 2021-02-21: 100 ug/kg/min via INTRAVENOUS

## 2021-02-21 MED ORDER — PROPOFOL 10 MG/ML IV BOLUS
INTRAVENOUS | Status: DC | PRN
  Administered 2021-02-21: 40 mg via INTRAVENOUS

## 2021-02-21 MED ORDER — SODIUM CHLORIDE 0.9 % IV SOLN: INTRAVENOUS | Status: DC | PRN

## 2021-02-21 MED ORDER — BUTAMBEN-TETRACAINE-BENZOCAINE 2-2-14 % EX AERO
INHALATION_SPRAY | CUTANEOUS | Status: DC | PRN
  Administered 2021-02-21: 2 via TOPICAL

## 2021-02-21 MED ORDER — SODIUM CHLORIDE 0.9 % IV SOLN: INTRAVENOUS | Status: DC

## 2021-02-21 NOTE — Interval H&P Note (Signed)
History and Physical Interval Note:  02/21/2021 8:42 AM  David Craig  has presented today for surgery, with the diagnosis of STROKE.  The various methods of treatment have been discussed with the patient and family. After consideration of risks, benefits and other options for treatment, the patient has consented to  Procedure(s): TRANSESOPHAGEAL ECHOCARDIOGRAM (TEE) (N/A) as a surgical intervention.  The patient's history has been reviewed, patient examined, no change in status, stable for surgery.  I have reviewed the patient's chart and labs.  Questions were answered to the patient's satisfaction.     Chilton Si, MD

## 2021-02-21 NOTE — Progress Notes (Signed)
PT Cancellation Note  Patient Details Name: David Craig MRN: [id number] DOB: 02-Aug-1985   Cancelled Treatment:    Reason Eval/Treat Not Completed: (P) Patient at procedure or test/unavailable (pt off floor for TEE in AM.) Will continue efforts per PT plan of care as schedule permits.   [name] 02/21/2021, 9:24 AM

## 2021-02-21 NOTE — Progress Notes (Signed)
Inpatient Rehab Admissions Coordinator:   I did receive insurance approval for CIR.  I may not have a bed for pt to admit today, but will follow for admission pending bed availability either today or tomorrow.    Estill Dooms, PT, DPT Admissions Coordinator [phone] 02/21/21  11:36 AM

## 2021-02-21 NOTE — Progress Notes (Signed)
Pt NPO at midnight

## 2021-02-21 NOTE — Plan of Care (Signed)
  Problem: Education: Goal: Knowledge of disease or condition will improve Outcome: Progressing Goal: Knowledge of secondary prevention will improve Outcome: Progressing Goal: Knowledge of patient specific risk factors addressed and post discharge goals established will improve Outcome: Progressing Goal: Individualized Educational Video(s) Outcome: Progressing   Problem: Coping: Goal: Will verbalize positive feelings about self Outcome: Progressing Goal: Will identify appropriate support needs Outcome: Progressing   Problem: Health Behavior/Discharge Planning: Goal: Ability to manage health-related needs will improve Outcome: Progressing   Problem: Self-Care: Goal: Ability to participate in self-care as condition permits will improve Outcome: Progressing   Problem: Ischemic Stroke/TIA Tissue Perfusion: Goal: Complications of ischemic stroke/TIA will be minimized Outcome: Progressing

## 2021-02-21 NOTE — Anesthesia Procedure Notes (Signed)
Procedure Name: MAC Date/Time: 02/21/2021 9:00 AM Performed by: Lowella Dell, CRNA Pre-anesthesia Checklist: Patient identified, Emergency Drugs available, Suction available, Patient being monitored and Timeout performed Patient Re-evaluated:Patient Re-evaluated prior to induction Oxygen Delivery Method: Nasal cannula Induction Type: IV induction Placement Confirmation: positive ETCO2 Dental Injury: Teeth and Oropharynx as per pre-operative assessment

## 2021-02-21 NOTE — Anesthesia Postprocedure Evaluation (Signed)
Anesthesia Post Note  Patient: David Craig  Procedure(s) Performed: TRANSESOPHAGEAL ECHOCARDIOGRAM (TEE) BUBBLE STUDY     Patient location during evaluation: Endoscopy Anesthesia Type: MAC Level of consciousness: awake and alert Pain management: pain level controlled Vital Signs Assessment: post-procedure vital signs reviewed and stable Respiratory status: spontaneous breathing, nonlabored ventilation, respiratory function stable and patient connected to nasal cannula oxygen Cardiovascular status: stable and blood pressure returned to baseline Postop Assessment: no apparent nausea or vomiting Anesthetic complications: no   No notable events documented.  Last Vitals:  Vitals:   02/21/21 0947 02/21/21 0954  BP: 116/81 116/90  Pulse: 67 67  Resp: 19 (!) 21  Temp:    SpO2: 98% 99%    Last Pain:  Vitals:   02/21/21 0940  TempSrc:   PainSc: 0-No pain                 March Rummage [place]

## 2021-02-21 NOTE — Progress Notes (Signed)
Occupational Therapy Treatment Patient Details Name: David Craig MRN: [id number] DOB: 07/02/1985 Today's Date: 02/21/2021   History of present illness David Craig is a 35 y.o. male admitted s/p fall in shower with left side weakness. MRI; acute right PCA stroke with additional right carotid terminus thrombus without MCA territory ischemia. TEE 9/21. PHMx:polysubstance abuse (cocaine, tobacco, vaping, oxycodone), migraine headaches.   OT comments  Noel is demonstrating functional progress towards his OT goals with noted improvement in sitting balance, activity tolerance and decreased assist levels. Session focused on repeating functional tasks and cueing to bring awareness to L extremities and L visual field. Pt demonstrated good ability to dependently move L extremities with R after multimodal cueing to bring attention to the L. He competed 10x sit<>stand with +1 assist for knee blocking and verbal cues, and stepping with +2 assist. Pt also completed clock draw and letter cancellation tasks this session with apparent neglect to L visual fields, however with anchoring technique and cueing he was able to find letters to the L of midline. Spent increased time going over education and exercises with family to promote awareness to the L side; they verbalized understanding. Pt continues to benefit from OT acutely. D/c recommendation remains appropriate.    Recommendations for follow up therapy are one component of a multi-disciplinary discharge planning process, led by the attending physician.  Recommendations may be updated based on patient status, additional functional criteria and insurance authorization.    Follow Up Recommendations  CIR;Supervision/Assistance - 24 hour    Equipment Recommendations  Other (comment)       Precautions / Restrictions Precautions Precautions: Fall Precaution Comments: L hemi, L homonymous hemianopsia Restrictions Weight Bearing Restrictions: No       Mobility  Bed Mobility Overal bed mobility: Needs Assistance Bed Mobility: Rolling;Sidelying to Sit Rolling: Mod assist Sidelying to sit: Mod assist       General bed mobility comments: mod A for verbal cues to appropriate placement of LUE (pt dependently movign LUE with RUE), and mod A for physically assisting pt with BLE off of the bed and pushing into sitting    Transfers Overall transfer level: Needs assistance   Transfers: Sit to/from [employer] Transfers Sit to Stand: Mod assist (+1) Stand pivot transfers: Mod assist (+1)       General transfer comment: Pt completed 10x sit<>stand with +1 assist ranging from minA-Mod A for L knee blocking and phsyical lifting. he required mod A for squat pivot +1 for verbal cues of hand placement and phsyical assist of L side.    Balance Overall balance assessment: Needs assistance Sitting-balance support: Feet supported Sitting balance-Leahy Scale: Fair Sitting balance - Comments: Able to maintain static sitting with supervision but at times did lose balance when reaching to grab bedrails with RUE.   Standing balance support: Bilateral upper extremity supported;During functional activity Standing balance-Leahy Scale: Poor Standing balance comment: pt stood with +1 assist with blocking of L knee; weight shifting/stepping in standing required +2 assist                           ADL either performed or assessed with clinical judgement   ADL Overall ADL's : Needs assistance/impaired       Grooming Details (indicate cue type and reason): educated family on how to assist pt with grooming tasks to functionally work on his deficits: place grooming supplies on empty table (eliminate distractions), place supplies at midline and eventually work towards  the L to force pt to scan into his L fields         Upper Body Dressing : Maximal assistance;Sitting Upper Body Dressing Details (indicate cue type and reason): Pt impulsive during task  and forgetting LUE. Required max vc to place LUE into gown first, and then proceed with R arm.     Toilet Transfer: Moderate assistance;Squat-pivot;BSC Toilet Transfer Details (indicate cue type and reason): simulated from bed>recliner. vc for hand placement, mod physical assit Toileting- Clothing Manipulation and Hygiene: Total assistance Toileting - Clothing Manipulation Details (indicate cue type and reason): pt soiled wtih urine upon arrival, unaware - incontinent     Functional mobility during ADLs: Moderate assistance;+2 for physical assistance;+2 for safety/equipment;Cueing for safety General ADL Comments: pt requried heavy cueing throughout all tasks to attend to L extremeties, to sustain attention to task, to eleminate inappropriate behaviors and for implusivity. mod-max physical assist for mobility throughout     Vision   Vision Assessment?: Yes Eye Alignment: Within Functional Limits Alignment/Gaze Preference: Gaze right Additional Comments: clock draw and letter cancelation tests completed, pt had poor number spacing on the clock, and drew a circle in the middle. And only canceled letters on the R side of the page. He benefitted from an anchor on the L side of the page and auditory cues to attend to the L in order to cancel letters past midline.   Perception     Praxis      Cognition Arousal/Alertness: Awake/alert Behavior During Therapy: Impulsive Overall Cognitive Status: Impaired/Different from baseline Area of Impairment: Attention;Following commands;Safety/judgement;Awareness;Problem solving                   Current Attention Level: Sustained   Following Commands: Follows one step commands consistently Safety/Judgement: Decreased awareness of deficits Awareness: Emergent Problem Solving: Requires verbal cues;Decreased initiation General Comments: Pt requires vc's to attend to L side, and safely move L extremeties. Pt was inappropriately impulsive, and  needed re-direction to tasks. pt completed clock draw and letter cancelation with about 10% accuracy        Exercises Other Exercises Other Exercises: educated family on letter cancelation exercises with anchoring technique to assist pt with becoming more aware of L past midline Other Exercises: Educated family on techniques for grooming and other functional tasks aide in pt's ability to become more aware of L visual field   Shoulder Instructions       General Comments VSS on RA. wife, sister and nephew present throughout session    Pertinent Vitals/ Pain       Pain Assessment: Faces Faces Pain Scale: No hurt Pain Intervention(s): Monitored during session;Repositioned   Frequency  Min 2X/week        Progress Toward Goals  OT Goals(current goals can now be found in the care plan section)  Progress towards OT goals: Progressing toward goals  Acute Rehab OT Goals Patient Stated Goal: none stated this session OT Goal Formulation: With patient Time For Goal Achievement: 03/01/21 Potential to Achieve Goals: Good ADL Goals Pt Will Perform Grooming: with min assist;sitting Pt Will Perform Upper Body Bathing: with min assist;sitting Pt Will Perform Upper Body Dressing: with mod assist;sitting Pt Will Transfer to Toilet: with min assist;squat pivot transfer;bedside commode Pt/caregiver will Perform Home Exercise Program: Increased ROM;Independently;Left upper extremity;With written HEP provided Additional ADL Goal #1: Pt will be S up to EOB and back to supine from EOB Additional ADL Goal #2: Pt will show safety with all tasks Additional ADL  Goal #3: Pt will be able to locate items on his left without cued 75% of time.  Plan Discharge plan remains appropriate;Frequency remains appropriate    Co-evaluation                 AM-PAC OT "6 Clicks" Daily Activity     Outcome Measure   Help from another person eating meals?: A Little Help from another person taking care of  personal grooming?: A Lot Help from another person toileting, which includes using toliet, bedpan, or urinal?: A Lot Help from another person bathing (including washing, rinsing, drying)?: A Lot Help from another person to put on and taking off regular upper body clothing?: A Lot Help from another person to put on and taking off regular lower body clothing?: A Lot 6 Click Score: 13    End of Session Equipment Utilized During Treatment: Gait belt  OT Visit Diagnosis: Unsteadiness on feet (R26.81);Other abnormalities of gait and mobility (R26.89);Muscle weakness (generalized) (M62.81);Other symptoms and signs involving cognitive function;Hemiplegia and hemiparesis Hemiplegia - Right/Left: Left Hemiplegia - dominant/non-dominant: Non-Dominant Hemiplegia - caused by: Cerebral infarction   Activity Tolerance Patient tolerated treatment well   Patient Left in chair;with call bell/phone within reach;with chair alarm set;with family/visitor present   Nurse Communication Mobility status        Time: 8119-1478 OT Time Calculation (min): 25 min  Charges: OT General Charges $OT Visit: 1 Visit OT Treatments $Therapeutic Activity: 23-37 mins   Dakoda Bassette A Nijah Orlich 02/21/2021, 4:32 PM

## 2021-02-21 NOTE — Progress Notes (Signed)
Speech Language Pathology Treatment: Dysphagia;Cognitive-Linquistic  Patient Details Name: David Craig MRN: [id number] DOB: 08/28/1985 Today's Date: 02/21/2021 Time: 1150-1209 SLP Time Calculation (min) (ACUTE ONLY): 19 min  Assessment / Plan / Recommendation Clinical Impression  Pt alert and upright in bed with wife present at bedside. Pt continues to present with impulsivity with PO intake, which resulted in x1 overt coughing with large straw sip of thin liquid. Min verbal cues for small sips, improved this presentation. He self-fed regular textured solid, independently utilizing lingual/finger sweep to clear buccal/oral cavity of any residuals. Diagnostic assessment of cognition continued this date with executive function and short term memory tasks. When provided simple mathematical problem, pt completed with 50% accuracy, improving to 100% with max verbal/written cues. He demonstrated mildly impaired working memory throughout this task, but was able to independently recall 4/4 unrelated words in a given list after short delay. Pt would continue to benefit from treatment for dysphagia and cognitive communication during acute stay.    HPI HPI: 35 y.o. male admitted s/p fall in shower with left side weakness. MRI: acute right PCA stroke with additional right carotid terminus thrombus without MCA territory ischemia. ETT for IR procedure (9/14- 9/15) PHMx: polysubstance abuse (cocaine, tobacco, vaping, oxycodone), migraine headaches.      SLP Plan  Continue with current plan of care      Recommendations for follow up therapy are one component of a multi-disciplinary discharge planning process, led by the attending physician.  Recommendations may be updated based on patient status, additional functional criteria and insurance authorization.    Recommendations  Diet recommendations: Regular;Thin liquid Liquids provided via: Cup;Straw Medication Administration: Whole meds with  puree Supervision: Patient able to self feed;Full supervision/cueing for compensatory strategies Compensations: Slow rate;Minimize environmental distractions;Small sips/bites;Lingual sweep for clearance of pocketing;Other (Comment) (finger sweep) Postural Changes and/or Swallow Maneuvers: Seated upright 90 degrees                Oral Care Recommendations: Oral care BID Follow up Recommendations: Inpatient Rehab SLP Visit Diagnosis: Dysphagia, unspecified (R13.10);Cognitive communication deficit (R41.841) Plan: Continue with current plan of care       GO              Avie Echevaria, MA, CCC-SLP Acute Rehabilitation Services Office Number: [phone]   Paulette Blanch  02/21/2021, 12:17 PM

## 2021-02-21 NOTE — CV Procedure (Signed)
Brief TEE Note  LVEF 60-65% No LA/LAA thrombus or masses Trivial TR Small PFO with R--> flow by saline microcavitation study  For additional details see full report.  Carla Whilden C. Duke Salvia, MD, [facility], [place] 02/21/2021 9:26 AM

## 2021-02-21 NOTE — Anesthesia Preprocedure Evaluation (Signed)
Anesthesia Evaluation  Patient identified by MRN, date of birth, ID band Patient awake    Reviewed: Allergy & Precautions, NPO status , Patient's Chart, lab work & pertinent test results  Airway Mallampati: II  TM Distance: >3 FB Neck ROM: Full    Dental  (+) Teeth Intact   Pulmonary neg pulmonary ROS, Patient abstained from smoking., former smoker,    Pulmonary exam normal        Cardiovascular negative cardio ROS   Rhythm:Regular Rate:Normal     Neuro/Psych  Headaches, CVA (let sided weakness), Residual Symptoms negative psych ROS   GI/Hepatic negative GI ROS, (+)     substance abuse  cocaine use and marijuana use,   Endo/Other  negative endocrine ROS  Renal/GU negative Renal ROS  negative genitourinary   Musculoskeletal negative musculoskeletal ROS (+) narcotic dependent  Abdominal (+)  Abdomen: soft.    Peds  Hematology negative hematology ROS (+)   Anesthesia Other Findings   Reproductive/Obstetrics                             Anesthesia Physical Anesthesia Plan  ASA: 3  Anesthesia Plan: MAC   Post-op Pain Management:    Induction: Intravenous  PONV Risk Score and Plan: 1 and Propofol infusion and Treatment may vary due to age or medical condition  Airway Management Planned: Simple Face Mask, Natural Airway and Nasal Cannula  Additional Equipment: None  Intra-op Plan:   Post-operative Plan:   Informed Consent: I have reviewed the patients History and Physical, chart, labs and discussed the procedure including the risks, benefits and alternatives for the proposed anesthesia with the patient or authorized representative who has indicated his/her understanding and acceptance.     Dental advisory given  Plan Discussed with: CRNA  Anesthesia Plan Comments: (Lab Results      Component                Value               Date                      WBC                       7.3                 02/21/2021                HGB                      16.1                02/21/2021                HCT                      45.7                02/21/2021                MCV                      87.5                02/21/2021                PLT  162                 02/21/2021           Lab Results      Component                Value               Date                      NA                       139                 02/21/2021                K                        4.2                 02/21/2021                CO2                      25                  02/21/2021                GLUCOSE                  107 (H)             02/21/2021                BUN                      11                  02/21/2021                CREATININE               0.61                02/21/2021                CALCIUM                  9.1                 02/21/2021                GFRNONAA                 >60                 02/21/2021          )        Anesthesia Quick Evaluation

## 2021-02-21 NOTE — Progress Notes (Addendum)
STROKE TEAM PROGRESS NOTE   INTERVAL HISTORY His wife is at the bedside. No acute events. He was moved to the floor late yesterday 9/20. TEE was performed this morning. Awaiting results.   Vitals:   02/21/21 0940 02/21/21 0947 02/21/21 0954 02/21/21 1137  BP: 112/77 116/81 116/90 125/84  Pulse: 67 67 67 (!) 58  Resp: 18 19 (!) 21 20  Temp:    98.2 F (36.8 C)  TempSrc:    Oral  SpO2: 97% 98% 99% 98%  Weight:      Height:       CBC:  Recent Labs  Lab 02/14/21 2150 02/14/21 2209 02/20/21 0112 02/21/21 0707  WBC 13.9*   < > 10.0 7.3  NEUTROABS 11.9*  --   --   --   HGB 17.9*   < > 16.0 16.1  HCT 50.4   < > 46.5 45.7  MCV 87.5   < > 88.2 87.5  PLT 176   < > 168 162   < > = values in this interval not displayed.   Basic Metabolic Panel:  Recent Labs  Lab 02/16/21 0148 02/17/21 0316 02/20/21 0112 02/21/21 0707  NA 138   < > 139 139  K 3.2*   < > 3.7 4.2  CL 100   < > 104 105  CO2 28   < > 24 25  GLUCOSE 102*   < > 126* 107*  BUN 12   < > 16 11  CREATININE 0.81   < > 0.66 0.61  CALCIUM 9.0   < > 8.9 9.1  MG 2.2  --   --   --    < > = values in this interval not displayed.   Lipid Panel:  Recent Labs  Lab 02/15/21 0243  CHOL 197  TRIG 190*  HDL 33*  CHOLHDL 6.0  VLDL 38  LDLCALC 126*   HgbA1c:  Recent Labs  Lab 02/15/21 0243  HGBA1C 5.0   Urine Drug Screen:  Recent Labs  Lab 02/15/21 0243  LABOPIA NONE DETECTED  COCAINSCRNUR POSITIVE*  LABBENZ NONE DETECTED  AMPHETMU NONE DETECTED  THCU POSITIVE*  LABBARB NONE DETECTED    Alcohol Level  Recent Labs  Lab 02/14/21 2150  ETH <10    IMAGING past 24 hours No results found.  PHYSICAL EXAM Well developed male not in distress. Afebrile. Head is nontraumatic. Neck is supple without bruit.  Resp: even and unlabored Card: SR on cardiac monitor  Extremities:   Neurological Exam  Awake and alert.  Right gaze preference but able to look to the left past midline.  Dense left homonymous  hemianopsia.  Left lower facial moderate weakness. Tongue midline.  Left hemiplegia improving with some antigravity distally in LLE.  Flaccidity and hypotonia on the left UE.   Good antigravity strength on the right.  Diminished left hemibody sensation but no neglect.  Gait deferred  ASSESSMENT/PLAN Mr. Leonell Lobdell is a 35 y.o. male with history of polysubstance abuse (cocaine, THC, opiates, vaping) presenting with left hemiparesis and right gaze preference, found to have right PCA territory ischemic infarcts. Out of window for alteplase. 1 pass endovascular treatment, TICI 0. Procedure aborted due to robust collateral flow to MCA territory from left side circulation. Patient has fetal PCAs, explaining PCA stroke from anterior circulation thrombus.   Stroke  right PCA infarct, secondary to terminal right ICA occlusion s/p failed mechanical thrombectomy etiology likely secondary to cocaine use/vasospasm Code Stroke CT head No acute abnormality.Marland Kitchen  ASPECTS 10.  CTA head & neck : questionable right ICA dissection at cavernous segment with more distal siphon occlusion, right proximal Pcomm and right proximal P2 occlusions  CT perfusion large area of penumbra without large core  Cerebral angio no discernable dissection flap but with decreased caliber right cervical ICA. Left to right perfusion of right sided MAC territory branches without MCA branch occlusion and no proximal occlusion of right PCA  MRI  multifocal ischemic infarcts of right PCA territory involving occipital, medical temporal, posterior limb of internal capsule, and ventrolateral thalamic regions.  MRA , after angio right P1 occlusion , right Ica occlusion  2D Echo LVEF 65-70%, no regional wall motion abnormalities, mild LVH  Transcranial Doppler study reveals grade 1 PFO Hypercoag work up PENDING TEE : results pending Lower extremity venous Dopplers negative for DVT LDL 126. Placed on high intensity statin, not on home medication, atorv  80 qd  HgbA1c 5.0 VTE prophylaxis - enoxaparin 40 qd  No antithrombotic prior to admission, now on aspirin 81 mg daily and clopidogrel 75 mg daily. X 90 days, then switch to monotherapy with asa alone. Therapy recommendations:  SLP dysphagia 2 diet, CIR OT/PT recs , needs 24 hour supervision  Disposition:  CIR, approved. Waiting on bed  Hypertension Home meds:  none  Stable off cleviprex Long-term BP goal normotensive  Hyperlipidemia Home meds:  none, added atorvastatin 80 mg LDL 126, goal < 70 Continue statin at discharge  Other Stroke Risk Factors Cigarette smoker, vapes, THC, cocaine, advised to stop using  ETOH use, alcohol level <10, advised to drink no more than 1-2 drink(s) a day Substance abuse - UDS:  THC POSITIVE, Cocaine POSITIVE. Patient advised to stop using due to stroke risk. Migraines: advised against triptans   Other Active Problems Dysphagia: SLP following, passed for dysphagia 2 diet Respiratory failure in setting of acute ischemic infarct: extubated successfully and now resolved.   This patient was seen and evaluated with Dr. Erlinda Hong. He directed the plan of care.   Laurey Morale, MSN, NP-C [facility] [phone]  Hospital day # 7   ATTENDING NOTE: I reviewed above note and agree with the assessment and plan. Pt was seen and examined.   Wife at bedside.  Patient lying in bed, had TEE today showed no thrombus or tumor, small PFO which has been known earlier.  Encourage working hard with PT and also self exercise.  Pending CIR, hopefully tomorrow.  For detailed assessment and plan, please refer to above as I have made changes wherever appropriate.   Rosalin Hawking, MD PhD Stroke Neurology 02/21/2021 8:26 PM     To contact Stroke Continuity provider, please refer to [email or web address] After hours, contact General Neurology

## 2021-02-21 NOTE — Transfer of Care (Signed)
Immediate Anesthesia Transfer of Care Note  Patient: David Craig  Procedure(s) Performed: TRANSESOPHAGEAL ECHOCARDIOGRAM (TEE) BUBBLE STUDY  Patient Location: PACU and Endoscopy Unit  Anesthesia Type:MAC  Level of Consciousness: awake, alert , oriented and patient cooperative  Airway & Oxygen Therapy: Patient Spontanous Breathing and Patient connected to nasal cannula oxygen  Post-op Assessment: Report given to RN and Post -op Vital signs reviewed and stable  Post vital signs: Reviewed and stable  Last Vitals:  Vitals Value Taken Time  BP    Temp    Pulse 72 02/21/21 0933  Resp 14 02/21/21 0933  SpO2 96 % 02/21/21 0933  Vitals shown include unvalidated device data.  Last Pain:  Vitals:   02/21/21 0820  TempSrc: Temporal  PainSc: 0-No pain         Complications: No notable events documented.

## 2021-02-21 NOTE — Progress Notes (Signed)
  Echocardiogram 2D Echocardiogram has been performed.  Leta Jungling M 02/21/2021, 10:02 AM

## 2021-02-22 ENCOUNTER — Encounter (HOSPITAL_COMMUNITY): Payer: Self-pay | Admitting: Physical Medicine & Rehabilitation

## 2021-02-22 ENCOUNTER — Other Ambulatory Visit: Payer: Self-pay

## 2021-02-22 ENCOUNTER — Inpatient Hospital Stay (HOSPITAL_COMMUNITY)
Admission: RE | Admit: 2021-02-22 | Discharge: 2021-03-22 | DRG: 057 | Disposition: A | Discharge status: Home / Self Care | Payer: BC Managed Care – PPO | Attending: Physical Medicine & Rehabilitation | Admitting: Physical Medicine & Rehabilitation

## 2021-02-22 DIAGNOSIS — Z7982 Long term (current) use of aspirin: Secondary | ICD-10-CM | POA: Diagnosis not present

## 2021-02-22 DIAGNOSIS — E785 Hyperlipidemia, unspecified: Secondary | ICD-10-CM | POA: Diagnosis present

## 2021-02-22 DIAGNOSIS — I69322 Dysarthria following cerebral infarction: Secondary | ICD-10-CM

## 2021-02-22 DIAGNOSIS — I6521 Occlusion and stenosis of right carotid artery: Secondary | ICD-10-CM | POA: Diagnosis not present

## 2021-02-22 DIAGNOSIS — I63531 Cerebral infarction due to unspecified occlusion or stenosis of right posterior cerebral artery: Secondary | ICD-10-CM

## 2021-02-22 DIAGNOSIS — I69354 Hemiplegia and hemiparesis following cerebral infarction affecting left non-dominant side: Secondary | ICD-10-CM | POA: Diagnosis not present

## 2021-02-22 DIAGNOSIS — S93402D Sprain of unspecified ligament of left ankle, subsequent encounter: Secondary | ICD-10-CM | POA: Diagnosis not present

## 2021-02-22 DIAGNOSIS — H53462 Homonymous bilateral field defects, left side: Secondary | ICD-10-CM | POA: Diagnosis present

## 2021-02-22 DIAGNOSIS — S93402A Sprain of unspecified ligament of left ankle, initial encounter: Secondary | ICD-10-CM | POA: Diagnosis not present

## 2021-02-22 DIAGNOSIS — E46 Unspecified protein-calorie malnutrition: Secondary | ICD-10-CM | POA: Diagnosis not present

## 2021-02-22 DIAGNOSIS — Z87891 Personal history of nicotine dependence: Secondary | ICD-10-CM | POA: Diagnosis not present

## 2021-02-22 DIAGNOSIS — I69391 Dysphagia following cerebral infarction: Secondary | ICD-10-CM | POA: Diagnosis not present

## 2021-02-22 DIAGNOSIS — E669 Obesity, unspecified: Secondary | ICD-10-CM | POA: Diagnosis not present

## 2021-02-22 DIAGNOSIS — R1310 Dysphagia, unspecified: Secondary | ICD-10-CM | POA: Diagnosis present

## 2021-02-22 DIAGNOSIS — Z7902 Long term (current) use of antithrombotics/antiplatelets: Secondary | ICD-10-CM | POA: Diagnosis not present

## 2021-02-22 DIAGNOSIS — R739 Hyperglycemia, unspecified: Secondary | ICD-10-CM | POA: Diagnosis present

## 2021-02-22 DIAGNOSIS — G479 Sleep disorder, unspecified: Secondary | ICD-10-CM | POA: Diagnosis not present

## 2021-02-22 DIAGNOSIS — F1490 Cocaine use, unspecified, uncomplicated: Secondary | ICD-10-CM | POA: Diagnosis present

## 2021-02-22 DIAGNOSIS — E8809 Other disorders of plasma-protein metabolism, not elsewhere classified: Secondary | ICD-10-CM | POA: Diagnosis not present

## 2021-02-22 DIAGNOSIS — R5383 Other fatigue: Secondary | ICD-10-CM | POA: Diagnosis not present

## 2021-02-22 DIAGNOSIS — Z6834 Body mass index (BMI) 34.0-34.9, adult: Secondary | ICD-10-CM | POA: Diagnosis not present

## 2021-02-22 DIAGNOSIS — I6329 Cerebral infarction due to unspecified occlusion or stenosis of other precerebral arteries: Secondary | ICD-10-CM | POA: Diagnosis present

## 2021-02-22 DIAGNOSIS — F1910 Other psychoactive substance abuse, uncomplicated: Secondary | ICD-10-CM | POA: Diagnosis not present

## 2021-02-22 LAB — CBC
HCT: 45.4 % (ref 39.0–52.0)
Hemoglobin: 16 g/dL (ref 13.0–17.0)
MCH: 30.9 pg (ref 26.0–34.0)
MCHC: 35.2 g/dL (ref 30.0–36.0)
MCV: 87.6 fL (ref 80.0–100.0)
Platelets: 166 10*3/uL (ref 150–400)
RBC: 5.18 MIL/uL (ref 4.22–5.81)
RDW: 12.3 % (ref 11.5–15.5)
WBC: 7.7 10*3/uL (ref 4.0–10.5)
nRBC: 0 % (ref 0.0–0.2)

## 2021-02-22 LAB — CARDIOLIPIN ANTIBODIES, IGG, IGM, IGA
Anticardiolipin IgA: <9 APL U/mL (ref 0–11)
Anticardiolipin IgG: <9 GPL U/mL (ref 0–14)
Anticardiolipin IgM: 9 MPL U/mL (ref 0–12)

## 2021-02-22 MED ORDER — ENOXAPARIN SODIUM 40 MG/0.4ML IJ SOSY
40.0000 mg | PREFILLED_SYRINGE | INTRAMUSCULAR | Status: DC
  Administered 2021-02-22 – 2021-03-21 (×28): 40 mg via SUBCUTANEOUS
  Filled 2021-02-22 (×28): qty 0.4

## 2021-02-22 MED ORDER — SENNOSIDES-DOCUSATE SODIUM 8.6-50 MG PO TABS
1.0000 | ORAL_TABLET | Freq: Every evening | ORAL | Status: DC | PRN
Start: 2021-02-22 — End: 2021-03-22
  Administered 2021-02-27: 1 via ORAL

## 2021-02-22 MED ORDER — ASPIRIN EC 325 MG PO TBEC
325.0000 mg | DELAYED_RELEASE_TABLET | Freq: Every day | ORAL | Status: DC
  Administered 2021-02-23 – 2021-03-22 (×28): 325 mg via ORAL
  Filled 2021-02-22 (×28): qty 1

## 2021-02-22 MED ORDER — POLYETHYLENE GLYCOL 3350 17 G PO PACK
17.0000 g | PACK | Freq: Every day | ORAL | Status: DC
  Administered 2021-02-23 – 2021-03-18 (×21): 17 g via ORAL
  Filled 2021-02-22 (×27): qty 1

## 2021-02-22 MED ORDER — DOCUSATE SODIUM 100 MG PO CAPS: 100.0000 mg | ORAL_CAPSULE | Freq: Two times a day (BID) | ORAL | 0 refills | Status: DC

## 2021-02-22 MED ORDER — POLYETHYLENE GLYCOL 3350 17 G PO PACK: 17.0000 g | PACK | Freq: Every day | ORAL | 0 refills | Status: DC

## 2021-02-22 MED ORDER — ATORVASTATIN CALCIUM 80 MG PO TABS: 80.0000 mg | ORAL_TABLET | Freq: Every day | ORAL | 1 refills | Status: DC

## 2021-02-22 MED ORDER — DOCUSATE SODIUM 100 MG PO CAPS
100.0000 mg | ORAL_CAPSULE | Freq: Two times a day (BID) | ORAL | Status: DC
  Administered 2021-02-22 – 2021-03-22 (×50): 100 mg via ORAL
  Filled 2021-02-22 (×55): qty 1

## 2021-02-22 MED ORDER — CLOPIDOGREL BISULFATE 75 MG PO TABS
75.0000 mg | ORAL_TABLET | Freq: Every day | ORAL | Status: DC
  Administered 2021-02-23 – 2021-03-22 (×28): 75 mg via ORAL
  Filled 2021-02-22 (×28): qty 1

## 2021-02-22 MED ORDER — CLOPIDOGREL BISULFATE 75 MG PO TABS
75.0000 mg | ORAL_TABLET | Freq: Every day | ORAL | 1 refills | Status: DC
Start: 2021-02-22 — End: 2021-03-21

## 2021-02-22 MED ORDER — ACETAMINOPHEN 160 MG/5ML PO SOLN: 650.0000 mg | ORAL | Status: DC | PRN

## 2021-02-22 MED ORDER — ASPIRIN 325 MG PO TBEC: 325.0000 mg | DELAYED_RELEASE_TABLET | Freq: Every day | ORAL | 0 refills | Status: AC

## 2021-02-22 MED ORDER — SENNOSIDES-DOCUSATE SODIUM 8.6-50 MG PO TABS: 1.0000 | ORAL_TABLET | Freq: Every evening | ORAL | 1 refills | Status: DC | PRN

## 2021-02-22 MED ORDER — PANTOPRAZOLE SODIUM 40 MG PO TBEC
40.0000 mg | DELAYED_RELEASE_TABLET | Freq: Every day | ORAL | Status: DC
  Administered 2021-02-23 – 2021-03-22 (×28): 40 mg via ORAL
  Filled 2021-02-22 (×28): qty 1

## 2021-02-22 MED ORDER — ATORVASTATIN CALCIUM 80 MG PO TABS
80.0000 mg | ORAL_TABLET | Freq: Every day | ORAL | Status: DC
  Administered 2021-02-22 – 2021-03-21 (×28): 80 mg via ORAL
  Filled 2021-02-22 (×28): qty 1

## 2021-02-22 MED ORDER — TRAZODONE HCL 50 MG PO TABS
50.0000 mg | ORAL_TABLET | Freq: Every evening | ORAL | Status: DC | PRN
  Administered 2021-02-22: 50 mg via ORAL
  Filled 2021-02-22: qty 1

## 2021-02-22 MED ORDER — PANTOPRAZOLE SODIUM 40 MG PO TBEC
40.0000 mg | DELAYED_RELEASE_TABLET | Freq: Every day | ORAL | 1 refills | Status: DC
Start: 2021-02-22 — End: 2021-03-21

## 2021-02-22 MED ORDER — ENOXAPARIN SODIUM 40 MG/0.4ML IJ SOSY: 40.0000 mg | PREFILLED_SYRINGE | INTRAMUSCULAR | Status: DC

## 2021-02-22 MED ORDER — ACETAMINOPHEN 650 MG RE SUPP: 650.0000 mg | RECTAL | Status: DC | PRN

## 2021-02-22 MED ORDER — ACETAMINOPHEN 325 MG PO TABS
650.0000 mg | ORAL_TABLET | ORAL | Status: DC | PRN
  Filled 2021-02-22: qty 2

## 2021-02-22 MED ORDER — ACETAMINOPHEN 325 MG PO TABS: 650.0000 mg | ORAL_TABLET | ORAL | 1 refills | Status: DC | PRN

## 2021-02-22 NOTE — Progress Notes (Signed)
Inpatient Rehab Admissions Coordinator:   I have a bed available for this patient to admit to CIR today.  Awaiting final confirmation from Stroke team that he is ready.  I will let pt/family and TOC team know.   Estill Dooms, PT, DPT Admissions Coordinator [phone] 02/22/21  10:18 AM

## 2021-02-22 NOTE — Progress Notes (Signed)
Physical Therapy Treatment Patient Details Name: David Craig MRN: [id number] DOB: 1985-06-13 Today's Date: 02/22/2021   History of Present Illness David Craig is a 35 y.o. male admitted s/p fall in shower with left side weakness. MRI; acute right PCA stroke with additional right carotid terminus thrombus without MCA territory ischemia. TEE 9/21. PHMx:polysubstance abuse (cocaine, tobacco, vaping, oxycodone), migraine headaches.    PT Comments    Pt received in supine, spouse present, pt alert and cooperative with therapy session, spouse supportive. Pt and spouse instructed on supine/seated LE HEP, with emphasis on attention to L side, active assist/range of motion as tolerated, cues for improved muscle activation (increased time, muscle tapping, cueing). Focus on seated/standing balance, LLE muscular activation in stance (LLE buckling with sidesteps toward L side), safety with bed mobility/transfers. Also reviewed positioning to prevent pressure injury. Pt continues to benefit from PT services to progress toward functional mobility goals.   Recommendations for follow up therapy are one component of a multi-disciplinary discharge planning process, led by the attending physician.  Recommendations may be updated based on patient status, additional functional criteria and insurance authorization.  Follow Up Recommendations  CIR     Equipment Recommendations  Wheelchair (measurements PT);Wheelchair cushion (measurements PT)    Recommendations for Other Services       Precautions / Restrictions Precautions Precautions: Fall Precaution Comments: L hemi, L homonymous hemianopsia, R gaze preference Restrictions Weight Bearing Restrictions: No     Mobility  Bed Mobility Overal bed mobility: Needs Assistance Bed Mobility: Rolling;Sidelying to Sit;Sit to Sidelying Rolling: Min assist Sidelying to sit: Min assist;+2 for safety/equipment;HOB elevated     Sit to sidelying: Mod assist;+2 for  safety/equipment General bed mobility comments: pt with improved LUE awareness and dependently moving LUE with RUE prior to rolling, pt performed log roll to L EOB with minA and use of HOB up/bed rail supports. Increased BLE assist with sit>supine via reverse log roll, spouse present for safety and caregiver instruction but not needing to physically assist.    Transfers Overall transfer level: Needs assistance   Transfers: Sit to/from Stand Sit to Stand: Mod assist;+2 safety/equipment (+1 physical assist but spouse present and guarding for safety)         General transfer comment: Pt completed 2x sit<>stand with +1 assist ranging from minA-Mod A for L knee blocking and phsyical lifting. he required mod A +2 for sidesteps along EOB with L knee blocked (pt LLE buckling while weight shifting to step with RLE), spouse present on pt R side for guarding/caregiver instruction.  Ambulation/Gait                 Stairs             Wheelchair Mobility    Modified Rankin (Stroke Patients Only) Modified Rankin (Stroke Patients Only) Pre-Morbid Rankin Score: No symptoms Modified Rankin: Severe disability     Balance Overall balance assessment: Needs assistance Sitting-balance support: Feet supported Sitting balance-Leahy Scale: Fair Sitting balance - Comments: Able to maintain static sitting with supervision but at times did lose balance or have increased posterior lean when reaching to grab bedrails with RUE/when multi-taking at EOB, needing min guard for dynamic LE tasks Postural control: Posterior lean;Left lateral lean Standing balance support: During functional activity;Single extremity supported Standing balance-Leahy Scale: Poor Standing balance comment: pt stood with +1 assist with blocking of L knee; weight shifting/stepping in standing required +2 assist for safety/support, pt spouse present and instructed on positioning/how to assist via gait belt  as caregiver  instruction                            Cognition Arousal/Alertness: Awake/alert Behavior During Therapy: Impulsive (mildly impulsive) Overall Cognitive Status: Impaired/Different from baseline Area of Impairment: Attention;Following commands;Safety/judgement;Awareness;Problem solving                   Current Attention Level: Sustained Memory: Decreased short-term memory (forgetting safe hand placement instructions) Following Commands: Follows one step commands consistently;Follows one step commands with increased time Safety/Judgement: Decreased awareness of deficits Awareness: Emergent Problem Solving: Requires verbal cues;Decreased initiation General Comments: Pt requires vc's to attend to L side, and safely move L extremity with increased time. Pt was at times impulsive, and needed re-direction to tasks.      Exercises Other Exercises Other Exercises: educated family and pt on supine BLE AROM/AAROM exercises and importance of sitting on L side for improved attention/visual field awareness Other Exercises: supine BLE A/AAROM: ankle pumps (PROM on LLE), heel slides, SLR, hip abduction, SAQ x10 reps ea (AA on LLE and AROM on RLE) Other Exercises: seated LLE AAROM: hip flexion, LAQ x10 reps ea    General Comments General comments (skin integrity, edema, etc.): VSS HR 70's bpm with exertion and SpO2 WFL on RA      Pertinent Vitals/Pain Pain Assessment: No/denies pain Faces Pain Scale: No hurt Pain Intervention(s): Monitored during session;Repositioned    Home Living                      Prior Function            PT Goals (current goals can now be found in the care plan section) Acute Rehab PT Goals Patient Stated Goal: To get home and go fishing with his boys (58.31 and 37 years old) PT Goal Formulation: With patient/family Time For Goal Achievement: 03/01/21 Potential to Achieve Goals: Good Progress towards PT goals: Progressing toward goals     Frequency    Min 4X/week      PT Plan Current plan remains appropriate       AM-PAC PT "6 Clicks" Mobility   Outcome Measure  Help needed turning from your back to your side while in a flat bed without using bedrails?: A Little Help needed moving from lying on your back to sitting on the side of a flat bed without using bedrails?: A Lot (increased assist toward R side) Help needed moving to and from a bed to a chair (including a wheelchair)?: A Lot Help needed standing up from a chair using your arms (e.g., wheelchair or bedside chair)?: A Lot Help needed to walk in hospital room?: Total Help needed climbing 3-5 steps with a railing? : Total 6 Click Score: 11    End of Session Equipment Utilized During Treatment: Gait belt Activity Tolerance: Patient tolerated treatment well Patient left: in bed;with call bell/phone within reach;with bed alarm set;with family/visitor present (heels floated, pt bed in chair position (plan to DC soon to CIR per medical team)) Nurse Communication: Mobility status PT Visit Diagnosis: Unsteadiness on feet (R26.81);Muscle weakness (generalized) (M62.81);Difficulty in walking, not elsewhere classified (R26.2)     Time: 2706-2376 PT Time Calculation (min) (ACUTE ONLY): 35 min  Charges:  $Therapeutic Exercise: 8-22 mins $Therapeutic Activity: 8-22 mins                     David Craig P., PTA Acute Rehabilitation Services Pager:  [phone] Office: [phone]    David Craig 02/22/2021, 2:00 PM

## 2021-02-22 NOTE — Evaluation (Signed)
Occupational Therapy Assessment and Plan  Patient Details  Name: David Craig MRN: [id number] Date of Birth: 1985/07/20  OT Diagnosis: abnormal posture, cognitive deficits, disturbance of vision, flaccid hemiplegia and hemiparesis, hemiplegia affecting non-dominant side, and muscle weakness (generalized) Rehab Potential: Rehab Potential (ACUTE ONLY): Good ELOS: 3 to 3.5 weeks   Today's Date: 02/23/2021 OT Individual Time: 2482-5003 OT Individual Time Calculation (min): 70 min     Hospital Problem: Principal Problem:   Cerebrovascular accident (CVA) due to occlusion of right posterior cerebral artery ([place])   Past Medical History: History reviewed. No pertinent past medical history. Past Surgical History:  Past Surgical History:  Procedure Laterality Date   BUBBLE STUDY  02/21/2021   Procedure: BUBBLE STUDY;  Surgeon: Skeet Latch, MD;  Location: [place];  Service: Cardiovascular;;   IR ANGIO INTRA EXTRACRAN SEL COM CAROTID INNOMINATE UNI L MOD SED  02/15/2021   IR ANGIO VERTEBRAL SEL VERTEBRAL BILAT MOD SED  02/15/2021   IR CT HEAD LTD  02/15/2021   IR PERCUTANEOUS ART THROMBECTOMY/INFUSION INTRACRANIAL INC DIAG ANGIO  02/15/2021   IR US GUIDE VASC ACCESS RIGHT  02/15/2021   RADIOLOGY WITH ANESTHESIA N/A 02/14/2021   Procedure: IR WITH ANESTHESIA;  Surgeon: Luanne Bras, MD;  Location: [place];  Service: Radiology;  Laterality: N/A;   TEE WITHOUT CARDIOVERSION N/A 02/21/2021   Procedure: TRANSESOPHAGEAL ECHOCARDIOGRAM (TEE);  Surgeon: Skeet Latch, MD;  Location: [place];  Service: Cardiovascular;  Laterality: N/A;    Assessment & Plan Clinical Impression:  David Craig is a 35 y/o right handed male with history of polysubstance abuse/tobacco ,migrane headaches and obesity with BMI 34.97.  Per chart review patient lives with spouse and 2 young children.  Reportedly independent prior to admission.  Working in Press photographer for [employer].  Presented 02/14/2021 with left-sided  weakness of acute onset as well as headache.  Cranial CT scan negative.  CT angiogram head and neck showed suspected dissection of the distal right internal carotid artery beginning at the distal cavernous segment, which became occluded proximal to the carotid terminus.  The right middle and anterior cerebral arteries fill via flow across the anterior communicating artery.  Large area of ischemic penumbra within the right hemisphere in a watershed/hypoperfusion type distribution.  Patient did not receive tPA.  Admission chemistries unremarkable except glucose 131, WBC 13,900, urine drug screen positive cocaine as well as marijuana.  Attempts of mechanical thrombectomy for right ICA occlusion failed per interventional radiology.  MRI showed multifocal ischemic infarcts of right PCA territory involving occipital, medial temporal, posterior limb of internal capsule and ventrolateral thalamic regions.  Echocardiogram with ejection fraction of 65 to 70% no wall motion abnormality.  Lower extremity Dopplers negative.  Transcranial Doppler study revealed grade 1 PFO.  TEE completed 02/21/2021 showing no thrombus or mass small PFO.  Currently maintained on aspirin 81 mg daily and Plavix 75 mg daily x90 days then aspirin alone.  Subcutaneous Lovenox for DVT prophylaxis.  His diet has been advanced to regular consistency.  Therapy evaluations completed due to patient's left-sided weakness was admitted for a comprehensive rehab program.  Patient currently requires max with basic self-care skills secondary to muscle weakness and muscle paralysis, decreased cardiorespiratoy endurance, abnormal tone, unbalanced muscle activation, decreased coordination, and decreased motor planning, field cut and hemianopsia, decreased midline orientation and decreased attention to left, decreased awareness, decreased problem solving, and decreased safety awareness, and decreased sitting balance, decreased standing balance, decreased postural  control, hemiplegia, and decreased balance strategies.  Prior  to hospitalization, patient could complete BADLs with independent .  Patient will benefit from skilled intervention to increase independence with basic self-care skills prior to discharge home with care partner.  Anticipate patient will require 24 hour supervision and minimal physical assistance and follow up home health.  OT - End of Session Endurance Deficit: Yes Endurance Deficit Description: Pt initiated lying down rest breaks in bed during functional activity OT Assessment Rehab Potential (ACUTE ONLY): Good OT Barriers to Discharge: Home environment access/layout;Lack of/limited family support;Behavior OT Patient demonstrates impairments in the following area(s): Balance;Behavior;Vision;Cognition;Endurance;Motor;Perception;Safety OT Basic ADL's Functional Problem(s): Eating;Grooming;Bathing;Dressing;Toileting OT Advanced ADL's Functional Problem(s): Simple Meal Preparation OT Transfers Functional Problem(s): Toilet;Tub/Shower OT Additional Impairment(s): Fuctional Use of Upper Extremity OT Plan OT Intensity: Minimum of 1-2 x/day, 45 to 90 minutes OT Frequency: 5 out of 7 days OT Duration/Estimated Length of Stay: 3 to 3.5 weeks OT Treatment/Interventions: Balance/vestibular training;Disease mangement/prevention;Neuromuscular re-education;Self Care/advanced ADL retraining;Therapeutic Exercise;Wheelchair propulsion/positioning;UE/LE Strength taining/ROM;Pain management;DME/adaptive equipment instruction;Cognitive remediation/compensation;Community reintegration;Functional electrical stimulation;Patient/family education;UE/LE Coordination activities;Splinting/orthotics;Discharge planning;Functional mobility training;Psychosocial support;Therapeutic Activities;Visual/perceptual remediation/compensation OT Self Feeding Anticipated Outcome(s): Supervision OT Basic Self-Care Anticipated Outcome(s): Supervision-Min A OT Toileting  Anticipated Outcome(s): Min A OT Bathroom Transfers Anticipated Outcome(s): Supervision OT Recommendation Recommendations for Other Services: Other (comment) (n/a) Patient destination: Home Follow Up Recommendations: 24 hour supervision/assistance;Home health OT Equipment Recommended: To be determined   OT Evaluation Precautions/Restrictions  Precautions Precautions: Fall Precaution Comments: L hemi, L homonymous hemianopsia, R gaze preference Restrictions Weight Bearing Restrictions: No Pain Pain Assessment Pain Scale: 0-10 Pain Score: 0-No pain Home Living/Prior Functioning Home Living Family/patient expects to be discharged to:: Private residence Living Arrangements: Spouse/significant other, Children Available Help at Discharge: Family, Available 24 hours/day Type of Home: House Home Access: Stairs to enter Technical brewer of Steps: 4 Home Layout: One level Bathroom Shower/Tub: Optometrist: No  Lives With: Spouse, Family (2 young sons, 53 y/o and 72 y/o) IADL History Occupation: Full time employment Type of Occupation: Working in [place] and Hobbies: fishing, taking care of his children Prior Function Level of Independence: Independent with basic ADLs, Independent with transfers Driving: Yes Vision Baseline Vision/History: 0 No visual deficits Ability to See in Adequate Light: 1 Impaired Patient Visual Report: Blurring of vision (in left eye) Alignment/Gaze Preference: Gaze right Visual Fields: Left homonymous hemianopsia Perception  Perception: Impaired Inattention/Neglect: Does not attend to left side of body;Does not attend to left visual field Praxis Praxis: Impaired Praxis Impairment Details: Perseveration;Motor planning Cognition Overall Cognitive Status: Impaired/Different from baseline Arousal/Alertness: Awake/alert Orientation Level: Person;Place;Situation Person: Oriented Place:  Oriented Situation: Oriented Year: 2022 Month: September Day of Week: Correct Immediate Memory Recall: Sock;Blue;Bed Memory Recall Sock: Without Cue Memory Recall Blue: Without Cue Memory Recall Bed: Without Cue Awareness: Impaired Awareness Impairment: Emergent impairment Behaviors: Impulsive Safety/Judgment: Impaired Comments: Pt impulsive with movement while completing tasks EOB, decreased insights into deficits including his fall risk Sensation Coordination Gross Motor Movements are Fluid and Coordinated: No Fine Motor Movements are Fluid and Coordinated: No Coordination and Movement Description: Affected by left hemiparesis Motor  Motor Motor: Hemiplegia;Abnormal tone;Abnormal postural alignment and control Motor - Skilled Clinical Observations: Lt sided hemiplegia  Trunk/Postural Assessment  Cervical Assessment Cervical Assessment: Exceptions to [facility] (mild forward head) Thoracic Assessment Thoracic Assessment: Exceptions to [facility] (rounded shoulders) Lumbar Assessment Lumbar Assessment: Exceptions to [facility] (posterior pelvic tilt) Postural Control Postural Control: Deficits on evaluation (decreased in sitting + standing)  Balance Balance Balance Assessed: Yes Dynamic Sitting Balance Dynamic  Sitting - Balance Support: During functional activity;No upper extremity supported Dynamic Sitting - Level of Assistance: 3: Mod assist;2: Max assist (washing his Lt foot, foot pulled up close towards body) Dynamic Standing Balance Dynamic Standing - Balance Support: During functional activity Dynamic Standing - Level of Assistance: 3: Mod assist;2: Max assist Dynamic Standing - Balance Activities: Lateral lean/weight shifting;Forward lean/weight shifting (completing pericare) Extremity/Trunk Assessment RUE Assessment RUE Assessment: Within Functional Limits Active Range of Motion (AROM) Comments: WNL LUE Assessment LUE Assessment: Exceptions to [facility] LUE Body System: Neuro Brunstrum  levels for arm and hand: Hand;Arm Brunstrum level for arm: Stage II Synergy is developing Brunstrum level for hand: Stage I Flaccidity LUE Tone LUE Tone: Hypotonic  Care Tool Care Tool Self Care Eating   Eating Assist Level: Moderate Assistance - Patient 50 - 74%    Oral Care  Oral care, brush teeth, clean dentures activity did not occur: Refused (pt opting to complete after he ate his breakfast) Oral Care Assist Level: Moderate Assistance - Patient 50 - 74%    Bathing   Body parts bathed by patient: Chest;Abdomen;Front perineal area;Buttocks;Right upper leg;Left upper leg;Face Body parts bathed by helper: Right arm;Left arm;Right lower leg;Left lower leg   Assist Level: 2 Helpers    Upper Body Dressing(including orthotics)   What is the patient wearing?: Pull over shirt   Assist Level: Maximal Assistance - Patient 25 - 49%    Lower Body Dressing (excluding footwear)   What is the patient wearing?: Pants Assist for lower body dressing: Maximal Assistance - Patient 25 - 49%    Putting on/Taking off footwear   What is the patient wearing?: Non-skid slipper socks;Ted hose Assist for footwear: Total Assistance - Patient < 25%       Care Tool Toileting Toileting activity   Assist for toileting: Total Assistance - Patient < 25% (using Stedy)     Care Tool Bed Mobility Roll left and right activity   Roll left and right assist level: Moderate Assistance - Patient 50 - 74%    Sit to lying activity   Sit to lying assist level: Moderate Assistance - Patient 50 - 74%    Lying to sitting on side of bed activity   Lying to sitting on side of bed assist level: the ability to move from lying on the back to sitting on the side of the bed with no back support.: Moderate Assistance - Patient 50 - 74%     Care Tool Transfers Sit to stand transfer   Sit to stand assist level: Moderate Assistance - Patient 50 - 74%    Chair/bed transfer   Chair/bed transfer assist level: Moderate  Assistance - Patient 50 - 74%     Toilet transfer   Assist Level: Dependent - Patient 0% (using Stedy)     Care Tool Cognition  Expression of Ideas and Wants    Understanding Verbal and Non-Verbal Content     Memory/Recall Ability     Refer to Care Plan for Long Term Goals  SHORT TERM GOAL WEEK 1 OT Short Term Goal 1 (Week 1): Pt will complete BSC or toilet transfer with Max A without Stedy OT Short Term Goal 2 (Week 1): Pt will complete LB dressing with no more than Mod balance assistance in standing OT Short Term Goal 3 (Week 1): Pt will complete UB dressing with Mod A using hemi techniques  Recommendations for other services: None    Skilled Therapeutic Intervention Skilled OT session completed with  focus on initial evaluation, education on OT role/POC, and establishment of patient-centered goals.   Pt greeted in bed with no c/o pain. Pts spouse [name] was at bedside. Had some questions pertaining to OT POC, actively observed and assisted during session. Min A for supine<sit due to pts Lt inattention and not transitioning his Lt leg EOB with the rest of his body. Bathing/dressing tasks completed at sit<stand level without AD. Pt with Lt lean and Lt knee buckling in standing, required Mod-Max A of 1 for balance while pt and spouse assisted with pericare and LB dressing tasks. Note that pt had a tough time orienting his clothing though did initiate trying to include his Lt side in dressing. Trouble finding items placed on his Lt side, was a bit perseverative and also impulsive with movement. 1 assist for [facility] transfer to the elevated toilet due to absence of BSC bucket. Min A for sit<stands in [place]. Pt with B+B void, Total A for toileting tasks before transitioning to the recliner. Pt remained in the recliner, in care of RN  for morning medicine and MD just arriving to perform morning assessment.   ADL ADL Eating: Moderate assistance Where Assessed-Eating: Chair Grooming: Maximal  assistance Where Assessed-Grooming: Edge of bed Upper Body Bathing: Moderate assistance Where Assessed-Upper Body Bathing: Edge of bed Lower Body Bathing: Dependent (+2 assist) Where Assessed-Lower Body Bathing: Edge of bed Upper Body Dressing: Maximal assistance Where Assessed-Upper Body Dressing: Edge of bed Lower Body Dressing: Dependent (+2 assist sit<stand) Where Assessed-Lower Body Dressing: Edge of bed Toileting: Dependent (using bariatric Stedy) Where Assessed-Toileting: Glass blower/designer: Dependent TEFL teacher) Armed forces technical officer Method: Arts development officer: Raised toilet seat Tub/Shower Transfer: Not assessed Mobility   Mod A sit<stand   Discharge Criteria: Patient will be discharged from OT if patient refuses treatment 3 consecutive times without medical reason, if treatment goals not met, if there is a change in medical status, if patient makes no progress towards goals or if patient is discharged from hospital.  The above assessment, treatment plan, treatment alternatives and goals were discussed and mutually agreed upon: by patient and by family  Skeet Simmer 02/23/2021, 12:35 PM

## 2021-02-22 NOTE — Progress Notes (Signed)
Admission Note:  Arrived to unit accompanied by staff and Wife. Patient oriented to unit and assigned room. Alert and oriented x4, vital signs stable.  Redness noted to left side of groin no other abnormalities noted. Skin intact; normal to ethnic group. Denies pain or any discomfort. Contracture boot noted to left leg. Left arm flaccid. IV to right lower arm. No signs or symptoms of infection noted to IV site.  No signs or symptoms of acute distress noted. Bed alarm turned on, bed in lowest position. HOB elevated, fluids at bedside; call bell within reach.

## 2021-02-22 NOTE — TOC CAGE-AID Note (Signed)
Transition of Care [facility]) - CAGE-AID Screening   Patient Details  Name: David Craig MRN: [id number] Date of Birth: 03-10-1986  [facility] of [facility] CM/SW Contact:    Chalyn Amescua C Tarpley-Carter, LCSWA Phone Number: 02/22/2021, 3:14 PM   Clinical Narrative: Pt participated in Cage-Aid.  Pt denied use of substance and ETOH.  Pt was offered resources.  Pt stated they did not feel that they were in need of resources at this time.  CSW will provide pt with resources for possible future use.  Kutter Schnepf Tarpley-Carter, MSW, LCSW-A Pronouns:  She/Her/Hers [facility] HealthTransitions of Care Clinical Social Worker Direct Number:  [phone] [email or web address]    CAGE-AID Screening:    Have You Ever Felt You Ought to Cut Down on Your Drinking or Drug Use?: No Have People Annoyed You By [employer] Your Drinking Or Drug Use?: No Have You Felt Bad Or Guilty About Your Drinking Or Drug Use?: No Have You Ever Had a Drink or Used Drugs First Thing In The Morning to Steady Your Nerves or to Get Rid of a Hangover?: No CAGE-AID Score: 0  Substance Abuse Education Offered: Yes  Substance abuse interventions: Transport planner

## 2021-02-22 NOTE — TOC Transition Note (Signed)
Transition of Care [facility]) - CM/SW Discharge Note   Patient Details  Name: David Craig MRN: [id number] Date of Birth: 1985-12-17  Transition [facility] Care [facility]) CM/SW Contact:  Kermit Balo, RN Phone Number: 02/22/2021, 1:08 PM   Clinical Narrative:    Patient is discharging to [facility] today. CM signing off.    Final next level of care: IP Rehab Facility Barriers to Discharge: No Barriers Identified   Patient Goals and CMS Choice        Discharge Placement                       Discharge Plan and Services                                     Social Determinants of Health (SDOH) Interventions     Readmission Risk Interventions No flowsheet data found.

## 2021-02-22 NOTE — Discharge Summary (Signed)
Stroke Discharge Summary  Patient ID: David Craig   MRN: [id number]      DOB: [birth year]  Date of Admission: 02/14/2021 Date of Discharge: 02/22/2021  Attending Physician:  No att. providers found, Stroke MD Consultant(s):   pulmonary/intensive care  Patient's PCP:  Patient, No Pcp Per (Inactive)  Discharge Diagnoses:   Active Problems:   Right carotid artery occlusion   Cerebrovascular accident (CVA) ([place])   Medications to be continued on Rehab Allergies as of 02/22/2021   No Known Allergies      Medication List     STOP taking these medications    ibuprofen 800 MG tablet Commonly known as: ADVIL       TAKE these medications    acetaminophen 325 MG tablet Commonly known as: TYLENOL Take 2 tablets (650 mg total) by mouth every 4 (four) hours as needed for mild pain (or temp > 37.5 C (99.5 F)).   aspirin 325 MG EC tablet Take 1 tablet (325 mg total) by mouth daily.   atorvastatin 80 MG tablet Commonly known as: LIPITOR Take 1 tablet (80 mg total) by mouth daily at 6 PM.   clopidogrel 75 MG tablet Commonly known as: PLAVIX Take 1 tablet (75 mg total) by mouth daily.   docusate sodium 100 MG capsule Commonly known as: COLACE Take 1 capsule (100 mg total) by mouth 2 (two) times daily.   MULTIVITAMIN PO Take 1 tablet by mouth daily.   pantoprazole 40 MG tablet Commonly known as: PROTONIX Take 1 tablet (40 mg total) by mouth daily.   polyethylene glycol 17 g packet Commonly known as: MIRALAX / GLYCOLAX Take 17 g by mouth daily.   senna-docusate 8.6-50 MG tablet Commonly known as: Senokot-S Take 1 tablet by mouth at bedtime as needed for moderate constipation or mild constipation.   Vitamin D3 1.25 MG (50000 UT) Caps Take 1.25 mg by mouth daily.        LABORATORY STUDIES CBC    Component Value Date/Time   WBC 7.7 02/22/2021 0339   RBC 5.18 02/22/2021 0339   HGB 16.0 02/22/2021 0339   HCT 45.4 02/22/2021 0339   PLT 166 02/22/2021 0339    MCV 87.6 02/22/2021 0339   MCH 30.9 02/22/2021 0339   MCHC 35.2 02/22/2021 0339   RDW 12.3 02/22/2021 0339   LYMPHSABS 1.2 02/14/2021 2150   MONOABS 0.7 02/14/2021 2150   EOSABS 0.0 02/14/2021 2150   BASOSABS 0.0 02/14/2021 2150   CMP    Component Value Date/Time   NA 139 02/21/2021 0707   K 4.2 02/21/2021 0707   CL 105 02/21/2021 0707   CO2 25 02/21/2021 0707   GLUCOSE 107 (H) 02/21/2021 0707   BUN 11 02/21/2021 0707   CREATININE 0.61 02/21/2021 0707   CALCIUM 9.1 02/21/2021 0707   PROT 6.3 (L) 02/16/2021 0148   ALBUMIN 3.5 02/16/2021 0148   AST 25 02/16/2021 0148   ALT 39 02/16/2021 0148   ALKPHOS 49 02/16/2021 0148   BILITOT 1.1 02/16/2021 0148   GFRNONAA >60 02/21/2021 0707   COAGS Lab Results  Component Value Date   INR 1.0 02/14/2021   Lipid Panel    Component Value Date/Time   CHOL 197 02/15/2021 0243   TRIG 190 (H) 02/15/2021 0243   HDL 33 (L) 02/15/2021 0243   CHOLHDL 6.0 02/15/2021 0243   VLDL 38 02/15/2021 0243   LDLCALC 126 (H) 02/15/2021 0243   HgbA1C  Lab Results  Component Value Date  HGBA1C 5.0 02/15/2021   Urinalysis    Component Value Date/Time   COLORURINE YELLOW 02/15/2021 0243   APPEARANCEUR CLEAR 02/15/2021 0243   LABSPEC >1.046 (H) 02/15/2021 0243   PHURINE 6.0 02/15/2021 0243   GLUCOSEU NEGATIVE 02/15/2021 0243   HGBUR NEGATIVE 02/15/2021 0243   BILIRUBINUR NEGATIVE 02/15/2021 0243   KETONESUR 80 (A) 02/15/2021 0243   PROTEINUR NEGATIVE 02/15/2021 0243   NITRITE NEGATIVE 02/15/2021 0243   LEUKOCYTESUR NEGATIVE 02/15/2021 0243   Urine Drug Screen     Component Value Date/Time   LABOPIA NONE DETECTED 02/15/2021 0243   COCAINSCRNUR POSITIVE (A) 02/15/2021 0243   LABBENZ NONE DETECTED 02/15/2021 0243   AMPHETMU NONE DETECTED 02/15/2021 0243   THCU POSITIVE (A) 02/15/2021 0243   LABBARB NONE DETECTED 02/15/2021 0243    Alcohol Level    Component Value Date/Time   ETH <10 02/14/2021 2150     SIGNIFICANT DIAGNOSTIC  STUDIES MR ANGIO HEAD WO CONTRAST  Result Date: 02/15/2021 EXAM: MRI HEAD WITHOUT CONTRAST MRA HEAD WITHOUT CONTRAST TECHNIQUE: Multiplanar, multi-echo pulse sequences of the brain and surrounding structures were acquired without intravenous contrast. Angiographic images of the Circle of Willis were acquired using MRA technique without intravenous contrast. COMPARISON:  No pertinent prior exam. FINDINGS: MRI HEAD FINDINGS A tailored MRI protocol was used, consisting of susceptibility weighted images, diffusion-weighted images, fat-suppressed axial T1-weighted images, axial T2-weighted images and axial FLAIR sequence. There is multifocal acute ischemia within the right PCA territory, involving the right occipital lobe, medial right temporal lobe and the ventrolateral thalamus. There is a punctate focus of acute ischemia in the right parietal white matter. The right internal carotid artery flow void is abnormal beginning at the level of the C2-3 disc space and continuing to the carotid terminus. Susceptibility weighted imaging shows magnetic susceptibility effect within the portion of the right internal carotid artery corresponding to the occluded area on the earlier CTA of the head and neck and also corresponding to the area of the abnormal T2-weighted imaging flow void. MRA HEAD FINDINGS POSTERIOR CIRCULATION: --Vertebral arteries: Normal --Inferior cerebellar arteries: Normal. --Basilar artery: Normal. --Superior cerebellar arteries: Normal. --Posterior cerebral arteries: There is partial occlusion of the right P1 segment. There is a small right posterior communicating artery that is also partially occluded. Left PCA is normal. There is reconstitution of the right PCA at the mid P2 segment. ANTERIOR CIRCULATION: --Intracranial internal carotid arteries: Diminished flow related enhancement of the distal right ICA loss of enhancement at approximately the level of the ophthalmic artery. Left ICA is normal.  --Anterior cerebral arteries (ACA): Normal. Both A1 segments are present. The anterior communicating artery is patent. --Middle cerebral arteries (MCA): Normal. The right MCA fills via flow across the anterior pathway of the circle-of-Willis. Fat-suppressed T1-weighted imaging shows no evidence of intramural thrombus within the right ICA. IMPRESSION: 1. Multifocal acute ischemia within the right PCA territory, involving the right occipital lobe, medial right temporal lobe and the ventrolateral thalamus/posterior limb of the internal capsule. The thalamocapsular infarct could affect the optic radiation. 2. Occlusion of the distal right internal carotid artery, as previously demonstrated by CTA. 3. Findings of this study favor thrombus within the distal right ICA. No clear evidence of dissection. 4. Right PCA P1 segment occlusion. 5. The right MCA fills via flow across the anterior pathway of the circle-of-Willis. These results were called by telephone at the time of interpretation on 02/15/2021 at 2:35 am to Drs. SRISHTI BHAGAT and Corrie Mckusick, who verbally acknowledged these results. Electronically  Signed   By: Ulyses Jarred M.D.   On: 02/15/2021 02:50   MR BRAIN WO CONTRAST  Result Date: 02/15/2021 : EXAM: MRI HEAD WITHOUT CONTRAST MRA HEAD WITHOUT CONTRAST TECHNIQUE: Multiplanar, multi-echo pulse sequences of the brain and surrounding structures were acquired without intravenous contrast. Angiographic images of the Circle of Willis were acquired using MRA technique without intravenous contrast. COMPARISON:   No pertinent prior exam. FINDINGS: MRI HEAD FINDINGS A tailored MRI protocol was used, consisting of susceptibility weighted images, diffusion-weighted images, fat-suppressed axial T1-weighted images, axial T2-weighted images and axial FLAIR sequence. There is multifocal acute ischemia within the right PCA territory, involving the right occipital lobe, medial right temporal lobe and the ventrolateral  thalamus. There is a punctate focus of acute ischemia in the right parietal white matter. The right internal carotid artery flow void is abnormal beginning at the level of the C2-3 disc space and continuing to the carotid terminus. Susceptibility weighted imaging shows magnetic susceptibility effect within the portion of the right internal carotid artery corresponding to the occluded area on the earlier CTA of the head and neck and also corresponding to the area of the abnormal T2-weighted imaging flow void. MRA HEAD FINDINGS POSTERIOR CIRCULATION: --Vertebral arteries: Normal --Inferior cerebellar arteries: Normal. --Basilar artery: Normal. --Superior cerebellar arteries: Normal. --Posterior cerebral arteries: There is partial occlusion of the right P1 segment. There is a small right posterior communicating artery that is also partially occluded. Left PCA is normal. There is reconstitution of the right PCA at the mid P2 segment. ANTERIOR CIRCULATION: --Intracranial internal carotid arteries: Diminished flow related enhancement of the distal right ICA loss of enhancement at approximately the level of the ophthalmic artery. Left ICA is normal. --Anterior cerebral arteries (ACA): Normal. Both A1 segments are present. The anterior communicating artery is patent. --Middle cerebral arteries (MCA): Normal. The right MCA fills via flow across the anterior pathway of the circle-of-Willis. Fat-suppressed T1-weighted imaging shows no evidence of intramural thrombus within the right ICA. IMPRESSION: 1. Multifocal acute ischemia within the right PCA territory, involving the right occipital lobe, medial right temporal lobe and the ventrolateral thalamus/posterior limb of the internal capsule. The thalamocapsular infarct could affect the optic radiation. 2. Occlusion of the distal right internal carotid artery, as previously demonstrated by CTA. 3. Findings of this study favor thrombus within the distal right ICA. No clear  evidence of dissection. 4. Right PCA P1 segment occlusion. 5. The right MCA fills via flow across the anterior pathway of the circle-of-Willis. These results were called by telephone at the time of interpretation on 02/15/2021 at 2: 35 am to Drs. SRISHTI BHAGAT and Corrie Mckusick, who verbally acknowledged these results. Electronically Signed   By: Ulyses Jarred M.D.   On: 02/15/2021 03:22   IR CT Head Ltd  Result Date: 02/15/2021 INDICATION: 35 year old male with a history of acute left-sided weakness, ICA occlusion, possible dissection EXAM: ULTRASOUND-GUIDED RIGHT COMMON FEMORAL ARTERY ACCESS FOUR-VESSEL CERVICAL/CEREBRAL ANGIOGRAM ATTEMPTED MECHANICAL THROMBECTOMY RIGHT INTRACRANIAL ICA ANGIO-SEAL FOR HEMOSTASIS COMPARISON:  CT same day MEDICATIONS: None ANESTHESIA/SEDATION: The anesthesia team was present to provide general endotracheal tube anesthesia and for patient monitoring during the procedure. Intubation was performed in biplane room 2. left radial arterial line was performed by the anesthesia team. Interventional neuro radiology nursing staff was also present. CONTRAST:  100 cc FLUOROSCOPY TIME:  Fluoroscopy Time: 13 minutes 30 seconds (2,922 mGy). COMPLICATIONS: None TECHNIQUE: Informed written consent was obtained from the patient's family after a thorough discussion of the procedural  risks, benefits and alternatives. Specific risks discussed include: Bleeding, infection, contrast reaction, kidney injury/failure, need for further procedure/surgery, arterial injury or dissection, embolization to new territory, intracranial hemorrhage (10-15% risk), neurologic deterioration, cardiopulmonary collapse, death. All questions were addressed. Maximal Sterile Barrier Technique was utilized including during the procedure including caps, mask, sterile gowns, sterile gloves, sterile drape, hand hygiene and skin antiseptic. A timeout was performed prior to the initiation of the procedure. The anesthesia team was  present to provide general endotracheal tube anesthesia and for patient monitoring during the procedure. Interventional neuro radiology nursing staff was also present. FINDINGS: Initial Findings: Ultrasound of the right inguinal region demonstrates patent right common femoral artery, without significant atherosclerotic plaque. No significant tortuosity of the branch vessels. Right vertebral artery: Normal course caliber and contour of the cervical segment of the right vertebral artery, with patent cervical branches. Anterior cerebral artery visualized with contribution from the lower cervical segment reticular medullary branches as well as the intracranial right vertebral artery. Mild tortuosity of the right V4 segment. No significant atherosclerotic changes of the basilar artery. Slip stream artifact decreases the resolution of the basilar artery. Proximal right and left PCA are patent. There is decreased capillary/parenchymal perfusion on the lateral view within the cuneus. Right common carotid artery:  Normal course caliber and contour. Right external carotid artery: Patent with antegrade flow. Right internal carotid artery: Decreased caliber in the contrast column of the right cervical ICA, with slow filling throughout the length of the right cervical ICA to the skull base. The contrast column terminates into the ophthalmic artery, with occlusion just beyond the ophthalmic artery. There is no spiraling filling defect that would confirm a dissection flap. Right MCA: No filling of the right MCA branches, secondary to occlusion in the supra ophthalmic segment. Right ACA: No filling of the ACA branches, given the occlusion in the siphon. Left vertebral artery: Left PCA: Left common carotid artery:  Normal course caliber and contour. Left external carotid artery: Patent with antegrade flow. Left internal carotid artery: Normal course caliber and contour of the cervical portion. Vertical and petrous segment patent  with normal course caliber contour. Cavernous segment patent. Clinoid segment patent. Antegrade flow of the ophthalmic artery. Ophthalmic segment patent. Terminus patent. Left MCA: M1 segment patent. Insular and opercular segments patent. Unremarkable caliber and course of the cortical segments. Typical arterial, capillary/ parenchymal, and venous phase. There is rapid left-to-right filling across a patent anterior communicating artery, with complete filling of the right anterior cerebral artery territory and the right MCA cerebral territory. There is slight delay throughout all phases of the early, late arterial phase, capillary phase, and parenchymal phase as well as the venous phase compared to the left secondary to the cross fill. The watershed of the [place] and MCA territory is filling, though with delay that corresponds to preoperative CT perfusion imaging. No MCA branch occlusion identified. No significant retrograde filling of the ICA terminus. Left ACA: A 1 segment patent. A 2 segment perfuses the right territory. Completion Findings: Initial TICI of the right carotid siphon: 0 Final TICI of the right carotid siphon: 0 Flat panel CT reveals no intracranial hemorrhage PROCEDURE: The anesthesia team was present to provide general endotracheal tube anesthesia and for patient monitoring during the procedure. Intubation was performed in neuro angio biplane room. Interventional neuro radiology nursing staff was also present. Ultrasound survey of the right inguinal region was performed with images stored and sent to PACs. 11 blade scalpel was used to make a small  incision. Blunt dissection was performed with US guidance. A micropuncture needle was used access the right common femoral artery under ultrasound. With excellent arterial blood flow returned, an .018 micro wire was passed through the needle, observed to enter the abdominal aorta under fluoroscopy. The needle was removed, and a micropuncture sheath was  placed over the wire. The inner dilator and wire were removed, and an 035 wire was advanced under fluoroscopy into the abdominal aorta. A standard 5 French sheath was placed, aspirated and flushed. JB 1 catheter was then advanced on the diagnostic wire, advanced to the proximal descending thoracic aorta. Wire was removed and a double flush was performed. A complete cerebral/cervical angiogram was then performed using standard technique with the assistance of a Glidewire, for selection of the right vertebral artery, right common carotid artery/right cervical ICA, left carotid artery, left vertebral artery. The JB 1 catheter was then withdrawn into the proximal descending thoracic aorta, flushed, with flow switch applied. Images were then reviewed with neurology team. After review of the images we elected to proceed with attempted aspiration at the site of occlusion. JB 1 catheter was navigated on the Glidewire into the distal right cervical ICA. Glidewire was removed and a rose in wire was placed into the cervical ICA. JB 1 was removed. The 5 French sheath was removed and a 25cm 8F straight vascular sheath was placed. The dilator was removed and the sheath was flushed. Sheath was attached to pressurized and heparinized saline bag for constant forward flow. A 95cm 087 "Walrus" balloon guide with coaxial 125cm Berenstein diagnostic catheter was advanced over the Rose an wire to the distal cervical ICA. Double flush of the balloon guide was performed, and attached to rotating hemostatic valve and a pressure bag. Angiogram was performed. Road map function was used once the occluded vessel was identified. A coaxial system was then advanced through the balloon catheter, which included the selected intermediate catheter, microcatheter, and microwire. In this scenario, the set up included a zoom 71 aspiration catheter, a Trevo Trak microcatheter, and 014 synchro soft wire. This system was advanced through the balloon guide  catheter under the road-map function, with adequate back-flush at the rotating hemostatic valve at that back end of the balloon guide. Coaxial system was gently advanced through the cavernous segment, to the level of the ophthalmic artery origin. The microwire and microcatheter were removed when the tip of the aspiration catheter was just proximal to the site of occlusion. Constant aspiration was then initiated using the proprietary engine at the hub of the zoom aspiration catheter. We confirmed continuous return of blood, and then advanced the aspiration catheter gently into the occlusion site beyond the ophthalmic artery origin. The intention was to gently advanced the catheter, and avoid advancing the catheter into the ICA terminus/proximal MCA, given the uncertainty of the source of occlusion (embolus versus dissection). Once we confirmed cessation of return of blood flow as we advanced the catheter, we observed a 30 second time interval with forward pressure on the aspiration catheter, and then withdrew the aspiration catheter through the cavernous segment and into the balloon guide. Free aspiration of blood was observed at the hub of the aspiration catheter. Repeat angiogram was performed demonstrating that the occlusion persisted. Given the working diagnosis of a carotid dissection as the source of the occlusion, based on preoperative imaging, we elected to terminate the procedure at this time as we confirmed adequate left-to-right perfusion of the MCA territory, with no MCA occlusion identified on the  initial cervical/cerebral angiogram. Catheter and balloon guide were removed. The skin at the puncture site was then cleaned with Chlorhexidine. The 8 French sheath was removed and an 52F angioseal was deployed. Flat panel CT was performed. Patient tolerated the procedure well and remained hemodynamically stable throughout. No complications were encountered and no significant blood loss encountered. IMPRESSION:  Status post ultrasound guided access right common femoral artery for complete four-vessel angiogram, with attempted mechanical thrombectomy of right carotid siphon occlusion, without restoration of flow beyond the ophthalmic artery. Angio-Seal deployed for hemostasis. Cervical angiogram reveals decreased caliber of the right cervical ICA, without definitive dissection flap. Cerebral angiogram confirms adequate left to right perfusion of the right-sided MCA territory branches with no MCA branch occlusion. No proximal occlusion identified within the right PCA Signed, Dulcy Fanny. Dellia Nims, [facility] Radiology PLAN: The patient will remain intubated. ICU status Urgent MRI/MRA, as diagnostic tool for right carotid siphon occlusion, to determine extent of infarction, as well as if there is obvious dissection flap versus thromboembolic disease as the etiology of occlusion. Target systolic blood pressure of less than 643 systolic Right hip straight time 4 hours Frequent neurovascular checks Electronically Signed   By: Corrie Mckusick D.O.   On: 02/15/2021 10:01   IR US Guide Vasc Access Right  Result Date: 02/15/2021 INDICATION: 35 year old male with a history of acute left-sided weakness, ICA occlusion, possible dissection EXAM: ULTRASOUND-GUIDED RIGHT COMMON FEMORAL ARTERY ACCESS FOUR-VESSEL CERVICAL/CEREBRAL ANGIOGRAM ATTEMPTED MECHANICAL THROMBECTOMY RIGHT INTRACRANIAL ICA ANGIO-SEAL FOR HEMOSTASIS COMPARISON:  CT same day MEDICATIONS: None ANESTHESIA/SEDATION: The anesthesia team was present to provide general endotracheal tube anesthesia and for patient monitoring during the procedure. Intubation was performed in biplane room 2. left radial arterial line was performed by the anesthesia team. Interventional neuro radiology nursing staff was also present. CONTRAST:  100 cc FLUOROSCOPY TIME:  Fluoroscopy Time: 13 minutes 30 seconds (2,922 mGy). COMPLICATIONS: None  TECHNIQUE: Informed written consent was obtained from the patient's family after a thorough discussion of the procedural risks, benefits and alternatives. Specific risks discussed include: Bleeding, infection, contrast reaction, kidney injury/failure, need for further procedure/surgery, arterial injury or dissection, embolization to new territory, intracranial hemorrhage (10-15% risk), neurologic deterioration, cardiopulmonary collapse, death. All questions were addressed. Maximal Sterile Barrier Technique was utilized including during the procedure including caps, mask, sterile gowns, sterile gloves, sterile drape, hand hygiene and skin antiseptic. A timeout was performed prior to the initiation of the procedure. The anesthesia team was present to provide general endotracheal tube anesthesia and for patient monitoring during the procedure. Interventional neuro radiology nursing staff was also present. FINDINGS: Initial Findings: Ultrasound of the right inguinal region demonstrates patent right common femoral artery, without significant atherosclerotic plaque. No significant tortuosity of the branch vessels. Right vertebral artery: Normal course caliber and contour of the cervical segment of the right vertebral artery, with patent cervical branches. Anterior cerebral artery visualized with contribution from the lower cervical segment reticular medullary branches as well as the intracranial right vertebral artery. Mild tortuosity of the right V4 segment. No significant atherosclerotic changes of the basilar artery. Slip stream artifact decreases the resolution of the basilar artery. Proximal right and left PCA are patent. There is decreased capillary/parenchymal perfusion on the lateral view within the cuneus. Right common carotid artery:  Normal course caliber and contour. Right external carotid artery: Patent with antegrade flow. Right internal carotid artery: Decreased caliber in the contrast column of the right  cervical ICA, with slow filling  throughout the length of the right cervical ICA to the skull base. The contrast column terminates into the ophthalmic artery, with occlusion just beyond the ophthalmic artery. There is no spiraling filling defect that would confirm a dissection flap. Right MCA: No filling of the right MCA branches, secondary to occlusion in the supra ophthalmic segment. Right ACA: No filling of the ACA branches, given the occlusion in the siphon. Left vertebral artery: Left PCA: Left common carotid artery:  Normal course caliber and contour. Left external carotid artery: Patent with antegrade flow. Left internal carotid artery: Normal course caliber and contour of the cervical portion. Vertical and petrous segment patent with normal course caliber contour. Cavernous segment patent. Clinoid segment patent. Antegrade flow of the ophthalmic artery. Ophthalmic segment patent. Terminus patent. Left MCA: M1 segment patent. Insular and opercular segments patent. Unremarkable caliber and course of the cortical segments. Typical arterial, capillary/ parenchymal, and venous phase. There is rapid left-to-right filling across a patent anterior communicating artery, with complete filling of the right anterior cerebral artery territory and the right MCA cerebral territory. There is slight delay throughout all phases of the early, late arterial phase, capillary phase, and parenchymal phase as well as the venous phase compared to the left secondary to the cross fill. The watershed of the Im[place] and MCA territory is filling, though with delay that corresponds to preoperative CT perfusion imaging. No MCA branch occlusion identified. No significant retrograde filling of the ICA terminus. Left ACA: A 1 segment patent. A 2 segment perfuses the right territory. Completion Findings: Initial TICI of the right carotid siphon: 0 Final TICI of the right carotid siphon: 0 Flat panel CT reveals no intracranial hemorrhage PROCEDURE:  The anesthesia team was present to provide general endotracheal tube anesthesia and for patient monitoring during the procedure. Intubation was performed in neuro angio biplane room. Interventional neuro radiology nursing staff was also present. Ultrasound survey of the right inguinal region was performed with images stored and sent to PACs. 11 blade scalpel was used to make a small incision. Blunt dissection was performed with US guidance. A micropuncture needle was used access the right common femoral artery under ultrasound. With excellent arterial blood flow returned, an .018 micro wire was passed through the needle, observed to enter the abdominal aorta under fluoroscopy. The needle was removed, and a micropuncture sheath was placed over the wire. The inner dilator and wire were removed, and an 035 wire was advanced under fluoroscopy into the abdominal aorta. A standard 5 French sheath was placed, aspirated and flushed. JB 1 catheter was then advanced on the diagnostic wire, advanced to the proximal descending thoracic aorta. Wire was removed and a double flush was performed. A complete cerebral/cervical angiogram was then performed using standard technique with the assistance of a Glidewire, for selection of the right vertebral artery, right common carotid artery/right cervical ICA, left carotid artery, left vertebral artery. The JB 1 catheter was then withdrawn into the proximal descending thoracic aorta, flushed, with flow switch applied. Images were then reviewed with neurology team. After review of the images we elected to proceed with attempted aspiration at the site of occlusion. JB 1 catheter was navigated on the Glidewire into the distal right cervical ICA. Glidewire was removed and a rose in wire was placed into the cervical ICA. JB 1 was removed. The 5 French sheath was removed and a 25cm 34F straight vascular sheath was placed. The dilator was removed and the sheath was flushed. Sheath was attached  to  pressurized and heparinized saline bag for constant forward flow. A 95cm 087 "Walrus" balloon guide with coaxial 125cm Berenstein diagnostic catheter was advanced over the Rose an wire to the distal cervical ICA. Double flush of the balloon guide was performed, and attached to rotating hemostatic valve and a pressure bag. Angiogram was performed. Road map function was used once the occluded vessel was identified. A coaxial system was then advanced through the balloon catheter, which included the selected intermediate catheter, microcatheter, and microwire. In this scenario, the set up included a zoom 71 aspiration catheter, a Trevo Trak microcatheter, and 014 synchro soft wire. This system was advanced through the balloon guide catheter under the road-map function, with adequate back-flush at the rotating hemostatic valve at that back end of the balloon guide. Coaxial system was gently advanced through the cavernous segment, to the level of the ophthalmic artery origin. The microwire and microcatheter were removed when the tip of the aspiration catheter was just proximal to the site of occlusion. Constant aspiration was then initiated using the proprietary engine at the hub of the zoom aspiration catheter. We confirmed continuous return of blood, and then advanced the aspiration catheter gently into the occlusion site beyond the ophthalmic artery origin. The intention was to gently advanced the catheter, and avoid advancing the catheter into the ICA terminus/proximal MCA, given the uncertainty of the source of occlusion (embolus versus dissection). Once we confirmed cessation of return of blood flow as we advanced the catheter, we observed a 30 second time interval with forward pressure on the aspiration catheter, and then withdrew the aspiration catheter through the cavernous segment and into the balloon guide. Free aspiration of blood was observed at the hub of the aspiration catheter. Repeat angiogram was  performed demonstrating that the occlusion persisted. 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 Catheter and balloon guide were removed. The skin at the puncture site was then cleaned with Chlorhexidine. The 8 French sheath was removed and an 57F angioseal was deployed. Flat panel CT was performed. Patient tolerated the procedure well and remained hemodynamically stable throughout. No complications were encountered and no significant blood loss encountered. 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 Angio-Seal deployed for hemostasis. Cervical angiogram reveals decreased caliber of the right cervical ICA, without definitive dissection flap. Cerebral angiogram confirms adequate left to right perfusion of the right-sided MCA territory branches with no MCA branch occlusion. No proximal occlusion identified within the right PCA Signed, Dulcy Fanny. Dellia Nims, [facility] Radiology PLAN: The patient will remain intubated. ICU status Urgent MRI/MRA, as diagnostic tool for right carotid siphon occlusion, to determine extent of infarction, as well as if there is obvious dissection flap versus thromboembolic disease as the etiology of occlusion. Target systolic blood pressure of less than 329 systolic Right hip straight time 4 hours Frequent neurovascular checks Electronically Signed   By: Corrie Mckusick D.O.   On: 02/15/2021 10:01   CT C-SPINE NO CHARGE  Result Date: 02/14/2021 CLINICAL DATA:  Fall EXAM: CT  CERVICAL SPINE WITHOUT CONTRAST TECHNIQUE: Multidetector CT imaging of the cervical spine was performed without  intravenous contrast. Multiplanar CT image reconstructions were also generated. COMPARISON:  None. FINDINGS: Alignment: No static subluxation. Facets are aligned. Occipital condyles and the lateral masses of C1 and C2 are normally approximated. Skull base and vertebrae: No acute fracture. Soft tissues and spinal canal: No prevertebral fluid or swelling. No visible canal hematoma. Disc levels: No advanced spinal canal or neural foraminal stenosis. Upper chest: No pneumothorax, pulmonary nodule or pleural effusion. Other: Normal visualized paraspinal cervical soft tissues. IMPRESSION: No acute fracture or static subluxation of the cervical spine. Electronically Signed   By: Ulyses Jarred M.D.   On: 02/14/2021 22:50   Portable Chest x-ray  Result Date: 02/15/2021 CLINICAL DATA:  Intubation EXAM: PORTABLE CHEST 1 VIEW COMPARISON:  None. FINDINGS: Endotracheal tube with tip at the clavicular heads. Low volume chest with hazy and streaky density suggesting atelectasis. Accentuated heart size by technique. No effusion or pneumothorax. IMPRESSION: 1. Low volume chest with atelectasis. 2. Unremarkable endotracheal tube positioning. Electronically Signed   By: Jorje Guild M.D.   On: 02/15/2021 04:04   VAS [place] TRANSCRANIAL DOPPLER W BUBBLES  Result Date: 02/19/2021  Transcranial Doppler with Bubble Patient Name:  David Craig  Date of Exam:   02/16/2021 Medical Rec #: [id number]     Accession #:    [id number] Date of Birth: 1985/07/07     Patient Gender: M Patient Age:   17 years Exam Location:  [facility] Procedure:      VAS [place] TRANSCRANIAL DOPPLER W BUBBLES Referring Phys: Charlene Brooke --------------------------------------------------------------------------------  Indications: Stroke. Comparison Study: No prior studies. Performing Technologist: Oliver Hum RVT  Examination Guidelines:  A complete evaluation includes B-mode imaging, spectral Doppler, color Doppler, and power Doppler as needed of all accessible portions of each vessel. Bilateral testing is considered an integral part of a complete examination. Limited examinations for reoccurring indications may be performed as noted.  Summary:  A vascular evaluation was performed. The right middle cerebral artery was studied. An IV was inserted into the patient's right Cephalic vein. Verbal informed consent was obtained.  Less than five HITS noted. Spencer grade 1 insignificant PFO. Positive TCD Bubble study indicative of a trivial likely clinically insignificant right to left shunt *See table(s) above for TCD measurements and observations.  Diagnosing physician: Antony Contras MD Electronically signed by Antony Contras MD on 02/19/2021 at 9:22:46 AM.    Final    ECHOCARDIOGRAM COMPLETE  Result Date: 02/15/2021    ECHOCARDIOGRAM REPORT   Patient Name:   David Craig Date of Exam: 02/15/2021 Medical Rec #:  [id number]    Height:       68.0 in Accession #:    [id number]   Weight:       230.0 lb Date of Birth:  Sep 24, 1985    BSA:          2.169 m Patient Age:    71 years     BP:           125/82 mmHg Patient Gender: M            HR:           69 bpm. Exam Location:  Inpatient Procedure: 2D Echo, Cardiac Doppler and Color Doppler Indications:    CVA  History:        Patient has no prior history of Echocardiogram examinations.                 CVA.  Sonographer:    [place] Referring Phys: [id number] [facility]  L BHAGAT IMPRESSIONS  1. Left ventricular ejection fraction, by estimation, is 65 to 70%. The left ventricle has normal function. The left ventricle has no regional wall motion abnormalities. There is mild left ventricular hypertrophy. Left ventricular diastolic parameters were normal.  2. Right ventricular systolic function is normal. The right ventricular size is normal.  3. The mitral valve is normal in structure. Trivial mitral valve regurgitation.  4. The  aortic valve is normal in structure. Aortic valve regurgitation is not visualized. FINDINGS  Left Ventricle: Left ventricular ejection fraction, by estimation, is 65 to 70%. The left ventricle has normal function. The left ventricle has no regional wall motion abnormalities. The left ventricular internal cavity size was normal in size. There is  mild left ventricular hypertrophy. Left ventricular diastolic parameters were normal. Right Ventricle: The right ventricular size is normal. Right vetricular wall thickness was not assessed. Right ventricular systolic function is normal. Left Atrium: Left atrial size was normal in size. Right Atrium: Right atrial size was normal in size. Pericardium: There is no evidence of pericardial effusion. Mitral Valve: The mitral valve is normal in structure. Trivial mitral valve regurgitation. Tricuspid Valve: The tricuspid valve is normal in structure. Tricuspid valve regurgitation is trivial. Aortic Valve: The aortic valve is normal in structure. Aortic valve regurgitation is not visualized. Aortic valve mean gradient measures 3.0 mmHg. Aortic valve peak gradient measures 5.1 mmHg. Aortic valve area, by VTI measures 2.51 cm. Pulmonic Valve: The pulmonic valve was grossly normal. Pulmonic valve regurgitation is not visualized. Aorta: The aortic root is normal in size and structure. IAS/Shunts: No atrial level shunt detected by color flow Doppler.  LEFT VENTRICLE PLAX 2D LVIDd:         4.50 cm     Diastology LVIDs:         2.90 cm     LV e' medial:    11.70 cm/s LV PW:         1.10 cm     LV E/e' medial:  7.7 LV IVS:        1.40 cm     LV e' lateral:   10.80 cm/s LVOT diam:     1.90 cm     LV E/e' lateral: 8.4 LV SV:         59 LV SV Index:   27 LVOT Area:     2.84 cm  LV Volumes (MOD) LV vol d, MOD A4C: 55.0 ml LV vol s, MOD A4C: 18.3 ml LV SV MOD A4C:     55.0 ml RIGHT VENTRICLE TAPSE (M-mode): 2.5 cm LEFT ATRIUM             Index       RIGHT ATRIUM           Index LA diam:         2.60 cm 1.20 cm/m  RA Area:     10.70 cm LA Vol (A2C):   37.7 ml 17.38 ml/m RA Volume:   18.70 ml  8.62 ml/m LA Vol (A4C):   31.7 ml 14.62 ml/m LA Biplane Vol: 34.2 ml 15.77 ml/m  AORTIC VALVE                   PULMONIC VALVE AV Area (Vmax):    2.56 cm    PV Vmax:       1.09 m/s AV Area (Vmean):   2.51 cm    PV Peak grad:  4.8 mmHg AV Area (VTI):  2.51 cm AV Vmax:           113.00 cm/s AV Vmean:          73.500 cm/s AV VTI:            0.234 m AV Peak Grad:      5.1 mmHg AV Mean Grad:      3.0 mmHg LVOT Vmax:         102.00 cm/s LVOT Vmean:        65.000 cm/s LVOT VTI:          0.207 m LVOT/AV VTI ratio: 0.88  AORTA Ao Root diam: 3.40 cm Ao Asc diam:  3.40 cm MITRAL VALVE MV Area (PHT): 3.86 cm    SHUNTS MV E velocity: 90.30 cm/s  Systemic VTI:  0.21 m MV A velocity: 50.30 cm/s  Systemic Diam: 1.90 cm MV E/A ratio:  1.80 Dorris Carnes MD Electronically signed by Dorris Carnes MD Signature Date/Time: 02/15/2021/2:16:26 PM    Final    ECHO TEE  Result Date: 02/21/2021    TRANSESOPHOGEAL ECHO REPORT   Patient Name:   David Craig Date of Exam: 02/21/2021 Medical Rec #:  [id number]    Height:       68.0 in Accession #:    [id number]   Weight:       230.0 lb Date of Birth:  March 13, 1986    BSA:          2.169 m Patient Age:    13 years     BP:           116/90 mmHg Patient Gender: M            HR:           80 bpm. Exam Location:  Inpatient Procedure: Transesophageal Echo, Cardiac Doppler, Color Doppler and 3D Echo Indications:     CVA  History:         Patient has prior history of Echocardiogram examinations, most                  recent 02/15/2021. Risk Factors:Hypertension and Dyslipidemia.                  Polysubstance abuse.  Sonographer:     Darlina Sicilian RDCS Referring Phys:  [id number] Darreld Mclean Diagnosing Phys: Skeet Latch MD PROCEDURE: After discussion of the risks and benefits of a TEE, an informed consent was obtained from the patient. TEE procedure time was 15 minutes. The transesophogeal  probe was passed without difficulty through the esophogus of the patient. Imaged were obtained with the patient in a left lateral decubitus position. Sedation performed by different physician. The patient was monitored while under deep sedation. Anesthestetic sedation was provided intravenously by Anesthesiology: 332.47m of Propofol. Image quality was good. The patient's vital signs; including heart rate, blood pressure, and oxygen saturation; remained stable throughout the procedure. The patient developed no complications during the procedure. IMPRESSIONS  1. Left ventricular ejection fraction, by estimation, is 60 to 65%. The left ventricle has normal function. The left ventricle has no regional wall motion abnormalities. Left ventricular diastolic function could not be evaluated.  2. Right ventricular systolic function is normal. The right ventricular size is normal.  3. No left atrial/left atrial appendage thrombus was detected.  4. The mitral valve is normal in structure. Trivial mitral valve regurgitation. No evidence of mitral stenosis.  5. The aortic valve is normal in structure. Aortic valve regurgitation is not visualized.  No aortic stenosis is present.  6. Evidence of atrial level shunting detected by color flow Doppler. Agitated saline contrast bubble study was positive with shunting observed within 3-6 cardiac cycles suggestive of interatrial shunt. There is a small patent foramen ovale with predominantly right to left shunting across the atrial septum. Conclusion(s)/Recommendation(s): Normal biventricular function without evidence of hemodynamically significant valvular heart disease. Findings are concerning for an interatrial shunt as detailed above. FINDINGS  Left Ventricle: Left ventricular ejection fraction, by estimation, is 60 to 65%. The left ventricle has normal function. The left ventricle has no regional wall motion abnormalities. The left ventricular internal cavity size was normal in size.  There is  no left ventricular hypertrophy. Left ventricular diastolic function could not be evaluated. Right Ventricle: The right ventricular size is normal. No increase in right ventricular wall thickness. Right ventricular systolic function is normal. Left Atrium: Left atrial size was normal in size. No left atrial/left atrial appendage thrombus was detected. Right Atrium: Right atrial size was normal in size. Pericardium: There is no evidence of pericardial effusion. Mitral Valve: The mitral valve is normal in structure. Trivial mitral valve regurgitation. No evidence of mitral valve stenosis. Tricuspid Valve: The tricuspid valve is normal in structure. Tricuspid valve regurgitation is trivial. No evidence of tricuspid stenosis. Aortic Valve: The aortic valve is normal in structure. Aortic valve regurgitation is not visualized. No aortic stenosis is present. Pulmonic Valve: The pulmonic valve was normal in structure. Pulmonic valve regurgitation is not visualized. No evidence of pulmonic stenosis. Aorta: The aortic root is normal in size and structure. IAS/Shunts: Evidence of atrial level shunting detected by color flow Doppler. Agitated saline contrast was given intravenously to evaluate for intracardiac shunting. Agitated saline contrast bubble study was positive with shunting observed within 3-6 cardiac cycles suggestive of interatrial shunt. A small patent foramen ovale is detected with predominantly right to left shunting across the atrial septum. There is no evidence of an atrial septal defect.  TRICUSPID VALVE TR Peak grad:   20.4 mmHg TR Vmax:        226.00 cm/s Skeet Latch MD Electronically signed by Skeet Latch MD Signature Date/Time: 02/21/2021/7:17:35 PM    Final    IR PERCUTANEOUS ART THROMBECTOMY/INFUSION INTRACRANIAL INC DIAG ANGIO  Result Date: 02/15/2021 INDICATION: 35 year old male with a history of acute left-sided weakness, ICA occlusion, possible dissection EXAM:  ULTRASOUND-GUIDED RIGHT COMMON FEMORAL ARTERY ACCESS FOUR-VESSEL CERVICAL/CEREBRAL ANGIOGRAM ATTEMPTED MECHANICAL THROMBECTOMY RIGHT INTRACRANIAL ICA ANGIO-SEAL FOR HEMOSTASIS COMPARISON:  CT same day MEDICATIONS: None ANESTHESIA/SEDATION: The anesthesia team was present to provide general endotracheal tube anesthesia and for patient monitoring during the procedure. Intubation was performed in biplane room 2. left radial arterial line was performed by the anesthesia team. Interventional neuro radiology nursing staff was also present. CONTRAST:  100 cc FLUOROSCOPY TIME:  Fluoroscopy Time: 13 minutes 30 seconds (2,922 mGy). COMPLICATIONS: None TECHNIQUE: Informed written consent was obtained from the patient's family after a thorough discussion of the procedural risks, benefits and alternatives. Specific risks discussed include: Bleeding, infection, contrast reaction, kidney injury/failure, need for further procedure/surgery, arterial injury or dissection, embolization to new territory, intracranial hemorrhage (10-15% risk), neurologic deterioration, cardiopulmonary collapse, death. All questions were addressed. Maximal Sterile Barrier Technique was utilized including during the procedure including caps, mask, sterile gowns, sterile gloves, sterile drape, hand hygiene and skin antiseptic. A timeout was performed prior to the initiation of the procedure. The anesthesia team was present to provide general endotracheal tube anesthesia and for patient monitoring during  the procedure. Interventional neuro radiology nursing staff was also present. FINDINGS: Initial Findings: Ultrasound of the right inguinal region demonstrates patent right common femoral artery, without significant atherosclerotic plaque. No significant tortuosity of the branch vessels. Right vertebral artery: Normal course caliber and contour of the cervical segment of the right vertebral artery, with patent cervical branches. Anterior cerebral artery  visualized with contribution from the lower cervical segment reticular medullary branches as well as the intracranial right vertebral artery. Mild tortuosity of the right V4 segment. No significant atherosclerotic changes of the basilar artery. Slip stream artifact decreases the resolution of the basilar artery. Proximal right and left PCA are patent. There is decreased capillary/parenchymal perfusion on the lateral view within the cuneus. Right common carotid artery:  Normal course caliber and contour. Right external carotid artery: Patent with antegrade flow. Right internal carotid artery: Decreased caliber in the contrast column of the right cervical ICA, with slow filling throughout the length of the right cervical ICA to the skull base. The contrast column terminates into the ophthalmic artery, with occlusion just beyond the ophthalmic artery. There is no spiraling filling defect that would confirm a dissection flap. Right MCA: No filling of the right MCA branches, secondary to occlusion in the supra ophthalmic segment. Right ACA: No filling of the ACA branches, given the occlusion in the siphon. Left vertebral artery: Left PCA: Left common carotid artery:  Normal course caliber and contour. Left external carotid artery: Patent with antegrade flow. Left internal carotid artery: Normal course caliber and contour of the cervical portion. Vertical and petrous segment patent with normal course caliber contour. Cavernous segment patent. Clinoid segment patent. Antegrade flow of the ophthalmic artery. Ophthalmic segment patent. Terminus patent. Left MCA: M1 segment patent. Insular and opercular segments patent. Unremarkable caliber and course of the cortical segments. Typical arterial, capillary/ parenchymal, and venous phase. There is rapid left-to-right filling across a patent anterior communicating artery, with complete filling of the right anterior cerebral artery territory and the right MCA cerebral territory.  There is slight delay throughout all phases of the early, late arterial phase, capillary phase, and parenchymal phase as well as the venous phase compared to the left secondary to the cross fill. The watershed of the [place] and MCA territory is filling, though with delay that corresponds to preoperative CT perfusion imaging. No MCA branch occlusion identified. No significant retrograde filling of the ICA terminus. Left ACA: A 1 segment patent. A 2 segment perfuses the right territory. Completion Findings: Initial TICI of the right carotid siphon: 0 Final TICI of the right carotid siphon: 0 Flat panel CT reveals no intracranial hemorrhage PROCEDURE: The anesthesia team was present to provide general endotracheal tube anesthesia and for patient monitoring during the procedure. Intubation was performed in neuro angio biplane room. Interventional neuro radiology nursing staff was also present. Ultrasound survey of the right inguinal region was performed with images stored and sent to PACs. 11 blade scalpel was used to make a small incision. Blunt dissection was performed with US guidance. A micropuncture needle was used access the right common femoral artery under ultrasound. With excellent arterial blood flow returned, an .018 micro wire was passed through the needle, observed to enter the abdominal aorta under fluoroscopy. The needle was removed, and a micropuncture sheath was placed over the wire. The inner dilator and wire were removed, and an 035 wire was advanced under fluoroscopy into the abdominal aorta. A standard 5 French sheath was placed, aspirated and flushed. JB 1 catheter was  then advanced on the diagnostic wire, advanced to the proximal descending thoracic aorta. Wire was removed and a double flush was performed. A complete cerebral/cervical angiogram was then performed using standard technique with the assistance of a Glidewire, for selection of the right vertebral artery, right common carotid artery/right  cervical ICA, left carotid artery, left vertebral artery. The JB 1 catheter was then withdrawn into the proximal descending thoracic aorta, flushed, with flow switch applied. Images were then reviewed with neurology team. After review of the images we elected to proceed with attempted aspiration at the site of occlusion. JB 1 catheter was navigated on the Glidewire into the distal right cervical ICA. Glidewire was removed and a rose in wire was placed into the cervical ICA. JB 1 was removed. The 5 French sheath was removed and a 25cm 12F straight vascular sheath was placed. The dilator was removed and the sheath was flushed. Sheath was attached to pressurized and heparinized saline bag for constant forward flow. A 95cm 087 "Walrus" balloon guide with coaxial 125cm Berenstein diagnostic catheter was advanced over the Rose an wire to the distal cervical ICA. Double flush of the balloon guide was performed, and attached to rotating hemostatic valve and a pressure bag. Angiogram was performed. Road map function was used once the occluded vessel was identified. A coaxial system was then advanced through the balloon catheter, which included the selected intermediate catheter, microcatheter, and microwire. In this scenario, the set up included a zoom 71 aspiration catheter, a Trevo Trak microcatheter, and 014 synchro soft wire. This system was advanced through the balloon guide catheter under the road-map function, with adequate back-flush at the rotating hemostatic valve at that back end of the balloon guide. Coaxial system was gently advanced through the cavernous segment, to the level of the ophthalmic artery origin. The microwire and microcatheter were removed when the tip of the aspiration catheter was just proximal to the site of occlusion. Constant aspiration was then initiated using the proprietary engine at the hub of the zoom aspiration catheter. We confirmed continuous return of blood, and then advanced the  aspiration catheter gently into the occlusion site beyond the ophthalmic artery origin. The intention was to gently advanced the catheter, and avoid advancing the catheter into the ICA terminus/proximal MCA, given the uncertainty of the source of occlusion (embolus versus dissection). Once we confirmed cessation of return of blood flow as we advanced the catheter, we observed a 30 second time interval with forward pressure on the aspiration catheter, and then withdrew the aspiration catheter through the cavernous segment and into the balloon guide. Free aspiration of blood was observed at the hub of the aspiration catheter. Repeat angiogram was performed demonstrating that the occlusion persisted. 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 Catheter and balloon guide were removed. The skin at the puncture site was then cleaned with Chlorhexidine. The 8 French sheath was removed and an 12F angioseal was deployed. Flat panel CT was performed. Patient tolerated the procedure well and remained hemodynamically stable throughout. No complications were encountered and no significant blood loss encountered. 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 Angio-Seal deployed  for hemostasis. Cervical angiogram reveals decreased caliber of the right cervical ICA, without definitive dissection flap. Cerebral angiogram confirms adequate left to right perfusion of the right-sided MCA territory branches with no MCA branch occlusion. No proximal occlusion identified within  the right PCA Signed, Dulcy Fanny. Dellia Nims, [facility] Radiology PLAN: The patient will remain intubated. ICU status Urgent MRI/MRA, as diagnostic tool for right carotid siphon occlusion, to determine extent of infarction, as well as if there is obvious dissection flap versus thromboembolic disease as the etiology of occlusion. Target systolic blood pressure of less than 027 systolic Right hip straight time 4 hours Frequent neurovascular checks Electronically Signed   By: Corrie Mckusick D.O.   On: 02/15/2021 10:01   CT HEAD CODE STROKE WO CONTRAST  Result Date: 02/14/2021 CLINICAL DATA:  Code stroke.  Left arm weakness and facial droop EXAM: CT HEAD WITHOUT CONTRAST TECHNIQUE: Contiguous axial images were obtained from the base of the skull through the vertex without intravenous contrast. COMPARISON:  None. FINDINGS: Brain: There is no mass, hemorrhage or extra-axial collection. The size and configuration of the ventricles and extra-axial CSF spaces are normal. The brain parenchyma is normal, without evidence of acute or chronic infarction. Vascular: No abnormal hyperdensity of the major intracranial arteries or dural venous sinuses. No intracranial atherosclerosis. Skull: The visualized skull base, calvarium and extracranial soft tissues are normal. Sinuses/Orbits: No fluid levels or advanced mucosal thickening of the visualized paranasal sinuses. No mastoid or middle ear effusion. The orbits are normal. ASPECTS [facility] Stroke Program Early CT Score) - Ganglionic level infarction (caudate, lentiform nuclei, internal capsule, insula, M1-M3 cortex): 7 - Supraganglionic infarction (M4-M6 cortex): 3 Total score (0-10 with 10 being normal): 10 IMPRESSION: 1. Normal head CT. 2. ASPECTS is 10. These results were communicated to Dr. Lesleigh Noe at 10:04 pm on 02/14/2021 by text page via the [facility] messaging system. Electronically Signed   By: Ulyses Jarred M.D.    On: 02/14/2021 22:04   VAS [place] LOWER EXTREMITY VENOUS (DVT)  Result Date: 02/16/2021  Lower Venous DVT Study Patient Name:  David Craig  Date of Exam:   02/16/2021 Medical Rec #: [id number]     Accession #:    [id number] Date of Birth: Jan 04, 1986     Patient Gender: M Patient Age:   35 years Exam Location:  [facility] Procedure:      VAS [place] LOWER EXTREMITY VENOUS (DVT) Referring Phys: PRAMOD SETHI --------------------------------------------------------------------------------  Indications: Stroke.  Risk Factors: None identified. Limitations: Poor ultrasound/tissue interface and patient positioning. Comparison Study: No prior studies. Performing Technologist: Oliver Hum RVT  Examination Guidelines: A complete evaluation includes B-mode imaging, spectral Doppler, color Doppler, and power Doppler as needed of all accessible portions of each vessel. Bilateral testing is considered an integral part of a complete examination. Limited examinations for reoccurring indications may be performed as noted. The reflux portion of the exam is performed with the patient in reverse Trendelenburg.  +---------+---------------+---------+-----------+----------+--------------+ RIGHT    CompressibilityPhasicitySpontaneityPropertiesThrombus Aging +---------+---------------+---------+-----------+----------+--------------+ CFV      Full           Yes      Yes                                 +---------+---------------+---------+-----------+----------+--------------+ SFJ      Full                                                        +---------+---------------+---------+-----------+----------+--------------+  FV Prox  Full                                                        +---------+---------------+---------+-----------+----------+--------------+ FV Mid   Full                                                        +---------+---------------+---------+-----------+----------+--------------+  FV DistalFull                                                        +---------+---------------+---------+-----------+----------+--------------+ PFV      Full                                                        +---------+---------------+---------+-----------+----------+--------------+ POP      Full           Yes      Yes                                 +---------+---------------+---------+-----------+----------+--------------+ PTV      Full                                                        +---------+---------------+---------+-----------+----------+--------------+ PERO     Full                                                        +---------+---------------+---------+-----------+----------+--------------+   +---------+---------------+---------+-----------+----------+--------------+ LEFT     CompressibilityPhasicitySpontaneityPropertiesThrombus Aging +---------+---------------+---------+-----------+----------+--------------+ CFV      Full           Yes      Yes                                 +---------+---------------+---------+-----------+----------+--------------+ SFJ      Full                                                        +---------+---------------+---------+-----------+----------+--------------+ FV Prox  Full                                                        +---------+---------------+---------+-----------+----------+--------------+  FV Mid   Full                                                        +---------+---------------+---------+-----------+----------+--------------+ FV DistalFull                                                        +---------+---------------+---------+-----------+----------+--------------+ PFV      Full                                                        +---------+---------------+---------+-----------+----------+--------------+ POP      Full           Yes      Yes                                  +---------+---------------+---------+-----------+----------+--------------+ PTV      Full                                                        +---------+---------------+---------+-----------+----------+--------------+ PERO     Full                                                        +---------+---------------+---------+-----------+----------+--------------+     Summary: RIGHT: - There is no evidence of deep vein thrombosis in the lower extremity.  - No cystic structure found in the popliteal fossa.  LEFT: - There is no evidence of deep vein thrombosis in the lower extremity.  - No cystic structure found in the popliteal fossa.  *See table(s) above for measurements and observations. Electronically signed by Orlie Pollen on 02/16/2021 at 7:06:10 PM.    Final    CT ANGIO HEAD NECK W WO CM W PERF (CODE STROKE)  Addendum Date: 02/14/2021   ADDENDUM REPORT: 02/14/2021 23:22 ADDENDUM: On further review, there is an area of occlusion of the proximal right posterior communicating artery and the proximal P2 segment of the right posterior cerebral artery. This is likely an extension of the process affecting the distal right internal carotid artery. These results were called by telephone at the time of interpretation on 02/14/2021 at 11:21 pm to provider [facility] , who verbally acknowledged these results. Electronically Signed   By: Ulyses Jarred M.D.   On: 02/14/2021 23:22   Result Date: 02/14/2021 CLINICAL DATA:  Left arm weakness and facial droop EXAM: CT ANGIOGRAPHY HEAD AND NECK CT PERFUSION BRAIN TECHNIQUE: Multidetector CT imaging of the head and neck was performed using the standard protocol during bolus administration of intravenous contrast. Multiplanar CT image reconstructions and MIPs were obtained  to evaluate the vascular anatomy. Carotid stenosis measurements (when applicable) are obtained utilizing NASCET criteria, using the distal internal carotid  diameter as the denominator. Multiphase CT imaging of the brain was performed following IV bolus contrast injection. Subsequent parametric perfusion maps were calculated using RAPID software. CONTRAST:  194m OMNIPAQUE IOHEXOL 350 MG/ML SOLN COMPARISON:  None. FINDINGS: CTA NECK FINDINGS SKELETON: There is no bony spinal canal stenosis. No lytic or blastic lesion. OTHER NECK: Normal pharynx, larynx and major salivary glands. No cervical lymphadenopathy. Unremarkable thyroid gland. UPPER CHEST: No pneumothorax or pleural effusion. No nodules or masses. AORTIC ARCH: There is no calcific atherosclerosis of the aortic arch. There is no aneurysm, dissection or hemodynamically significant stenosis of the visualized portion of the aorta. Conventional 3 vessel aortic branching pattern. The visualized proximal subclavian arteries are widely patent. RIGHT CAROTID SYSTEM: There is tapering and hypoenhancement of the distal right internal carotid artery. LEFT CAROTID SYSTEM: Normal without aneurysm, dissection or stenosis. VERTEBRAL ARTERIES: Left dominant configuration. Both origins are clearly patent. There is no dissection, occlusion or flow-limiting stenosis to the skull base (V1-V3 segments). CTA HEAD FINDINGS POSTERIOR CIRCULATION: --Vertebral arteries: Normal V4 segments. --Inferior cerebellar arteries: Normal. --Basilar artery: Normal. --Superior cerebellar arteries: Normal. --Posterior cerebral arteries (PCA): Normal. ANTERIOR CIRCULATION: --Intracranial internal carotid arteries: The right internal carotid artery is occluded at the distal cavernous segment. It remains occluded to the carotid terminus. The right middle and anterior cerebral arteries fill via flow across the anterior communicating artery. --Anterior cerebral arteries (ACA): Normal. Both A1 segments are present. Patent anterior communicating artery (a-comm). --Middle cerebral arteries (MCA): Normal. VENOUS SINUSES: As permitted by contrast timing,  patent. ANATOMIC VARIANTS: None Review of the MIP images confirms the above findings. CT Brain Perfusion Findings: ASPECTS: 10 CBF (<30%) Volume: 066mPerfusion (Tmax>6.0s) volume: 12011mismatch Volume: 120m108mfarction Location:No core infarct. Large area of ischemic penumbra within the right hemisphere in a watershed/hypoperfusion type distribution. IMPRESSION: 1. 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 The right middle and anterior cerebral arteries fill via flow across the anterior communicating artery. 2. Large area of ischemic penumbra within the right hemisphere in a watershed/hypoperfusion type distribution. No core infarct. Critical Value/emergent results were called by telephone at the time of interpretation on 02/14/2021 at 10:35 pm to provider [facility] verbally acknowledged these results. Electronically Signed: By: KeviUlyses Jarred. On: 02/14/2021 22:36   IR ANGIO INTRA EXTRACRAN SEL COM CAROTID INNOMINATE UNI L MOD SED  Result Date: 02/15/2021 INDICATION: 35 y20r old male with a history of acute left-sided weakness, ICA occlusion, possible dissection EXAM: ULTRASOUND-GUIDED RIGHT COMMON FEMORAL ARTERY ACCESS FOUR-VESSEL CERVICAL/CEREBRAL ANGIOGRAM ATTEMPTED MECHANICAL THROMBECTOMY RIGHT INTRACRANIAL ICA ANGIO-SEAL FOR HEMOSTASIS COMPARISON:  CT same day MEDICATIONS: None ANESTHESIA/SEDATION: The anesthesia team was present to provide general endotracheal tube anesthesia and for patient monitoring during the procedure. Intubation was performed in biplane room 2. left radial arterial line was performed by the anesthesia team. Interventional neuro radiology nursing staff was also present. CONTRAST:  100 cc FLUOROSCOPY TIME:  Fluoroscopy Time: 13 minutes 30 seconds (2,922 mGy). COMPLICATIONS: None TECHNIQUE: Informed written consent was obtained from the patient's  family after a thorough discussion of the procedural risks, benefits and alternatives. Specific risks discussed include: Bleeding, infection, contrast reaction, kidney injury/failure, need for further procedure/surgery, arterial injury or dissection, embolization to new territory, intracranial hemorrhage (10-15% risk), neurologic deterioration, cardiopulmonary collapse, death. All  questions were addressed. Maximal Sterile Barrier Technique was utilized including during the procedure including caps, mask, sterile gowns, sterile gloves, sterile drape, hand hygiene and skin antiseptic. A timeout was performed prior to the initiation of the procedure. The anesthesia team was present to provide general endotracheal tube anesthesia and for patient monitoring during the procedure. Interventional neuro radiology nursing staff was also present. FINDINGS: Initial Findings: Ultrasound of the right inguinal region demonstrates patent right common femoral artery, without significant atherosclerotic plaque. No significant tortuosity of the branch vessels. Right vertebral artery: Normal course caliber and contour of the cervical segment of the right vertebral artery, with patent cervical branches. Anterior cerebral artery visualized with contribution from the lower cervical segment reticular medullary branches as well as the intracranial right vertebral artery. Mild tortuosity of the right V4 segment. No significant atherosclerotic changes of the basilar artery. Slip stream artifact decreases the resolution of the basilar artery. Proximal right and left PCA are patent. There is decreased capillary/parenchymal perfusion on the lateral view within the cuneus. Right common carotid artery:  Normal course caliber and contour. Right external carotid artery: Patent with antegrade flow. Right internal carotid artery: Decreased caliber in the contrast column of the right cervical ICA, with slow filling throughout the length of the right  cervical ICA to the skull base. The contrast column terminates into the ophthalmic artery, with occlusion just beyond the ophthalmic artery. There is no spiraling filling defect that would confirm a dissection flap. Right MCA: No filling of the right MCA branches, secondary to occlusion in the supra ophthalmic segment. Right ACA: No filling of the ACA branches, given the occlusion in the siphon. Left vertebral artery: Left PCA: Left common carotid artery:  Normal course caliber and contour. Left external carotid artery: Patent with antegrade flow. Left internal carotid artery: Normal course caliber and contour of the cervical portion. Vertical and petrous segment patent with normal course caliber contour. Cavernous segment patent. Clinoid segment patent. Antegrade flow of the ophthalmic artery. Ophthalmic segment patent. Terminus patent. Left MCA: M1 segment patent. Insular and opercular segments patent. Unremarkable caliber and course of the cortical segments. Typical arterial, capillary/ parenchymal, and venous phase. There is rapid left-to-right filling across a patent anterior communicating artery, with complete filling of the right anterior cerebral artery territory and the right MCA cerebral territory. There is slight delay throughout all phases of the early, late arterial phase, capillary phase, and parenchymal phase as well as the venous phase compared to the left secondary to the cross fill. The watershed of the [place] and MCA territory is filling, though with delay that corresponds to preoperative CT perfusion imaging. No MCA branch occlusion identified. No significant retrograde filling of the ICA terminus. Left ACA: A 1 segment patent. A 2 segment perfuses the right territory. Completion Findings: Initial TICI of the right carotid siphon: 0 Final TICI of the right carotid siphon: 0 Flat panel CT reveals no intracranial hemorrhage PROCEDURE: The anesthesia team was present to provide general endotracheal  tube anesthesia and for patient monitoring during the procedure. Intubation was performed in neuro angio biplane room. Interventional neuro radiology nursing staff was also present. Ultrasound survey of the right inguinal region was performed with images stored and sent to PACs. 11 blade scalpel was used to make a small incision. Blunt dissection was performed with US guidance. A micropuncture needle was used access the right common femoral artery under ultrasound. With excellent arterial blood flow returned, an .018 micro wire was passed through the needle,  observed to enter the abdominal aorta under fluoroscopy. The needle was removed, and a micropuncture sheath was placed over the wire. The inner dilator and wire were removed, and an 035 wire was advanced under fluoroscopy into the abdominal aorta. A standard 5 French sheath was placed, aspirated and flushed. JB 1 catheter was then advanced on the diagnostic wire, advanced to the proximal descending thoracic aorta. Wire was removed and a double flush was performed. A complete cerebral/cervical angiogram was then performed using standard technique with the assistance of a Glidewire, for selection of the right vertebral artery, right common carotid artery/right cervical ICA, left carotid artery, left vertebral artery. The JB 1 catheter was then withdrawn into the proximal descending thoracic aorta, flushed, with flow switch applied. Images were then reviewed with neurology team. After review of the images we elected to proceed with attempted aspiration at the site of occlusion. JB 1 catheter was navigated on the Glidewire into the distal right cervical ICA. Glidewire was removed and a rose in wire was placed into the cervical ICA. JB 1 was removed. The 5 French sheath was removed and a 25cm 39F straight vascular sheath was placed. The dilator was removed and the sheath was flushed. Sheath was attached to pressurized and heparinized saline bag for constant forward  flow. A 95cm 087 "Walrus" balloon guide with coaxial 125cm Berenstein diagnostic catheter was advanced over the Rose an wire to the distal cervical ICA. Double flush of the balloon guide was performed, and attached to rotating hemostatic valve and a pressure bag. Angiogram was performed. Road map function was used once the occluded vessel was identified. A coaxial system was then advanced through the balloon catheter, which included the selected intermediate catheter, microcatheter, and microwire. In this scenario, the set up included a zoom 71 aspiration catheter, a Trevo Trak microcatheter, and 014 synchro soft wire. This system was advanced through the balloon guide catheter under the road-map function, with adequate back-flush at the rotating hemostatic valve at that back end of the balloon guide. Coaxial system was gently advanced through the cavernous segment, to the level of the ophthalmic artery origin. The microwire and microcatheter were removed when the tip of the aspiration catheter was just proximal to the site of occlusion. Constant aspiration was then initiated using the proprietary engine at the hub of the zoom aspiration catheter. We confirmed continuous return of blood, and then advanced the aspiration catheter gently into the occlusion site beyond the ophthalmic artery origin. The intention was to gently advanced the catheter, and avoid advancing the catheter into the ICA terminus/proximal MCA, given the uncertainty of the source of occlusion (embolus versus dissection). Once we confirmed cessation of return of blood flow as we advanced the catheter, we observed a 30 second time interval with forward pressure on the aspiration catheter, and then withdrew the aspiration catheter through the cavernous segment and into the balloon guide. Free aspiration of blood was observed at the hub of the aspiration catheter. Repeat angiogram was performed demonstrating that the occlusion persisted. Given the  working diagnosis of a carotid dissection as the source of the occlusion, based on preoperative imaging, we elected to terminate the procedure at this time as we confirmed adequate left-to-right perfusion of the MCA territory, with no MCA occlusion identified on the initial cervical/cerebral angiogram. Catheter and balloon guide were removed. The skin at the puncture site was then cleaned with Chlorhexidine. The 8 French sheath was removed and an 39F angioseal was deployed. Flat panel CT was  performed. Patient tolerated the procedure well and remained hemodynamically stable throughout. No complications were encountered and no significant blood loss encountered. 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 Angio-Seal deployed for hemostasis. Cervical angiogram reveals decreased caliber of the right cervical ICA, without definitive dissection flap. Cerebral angiogram confirms adequate left to right perfusion of the right-sided MCA territory branches with no MCA branch occlusion. No proximal occlusion identified within the right PCA Signed, Dulcy Fanny. Dellia Nims, [facility] Radiology PLAN: The patient will remain intubated. ICU status Urgent MRI/MRA, as diagnostic tool for right carotid siphon occlusion, to determine extent of infarction, as well as if there is obvious dissection flap versus thromboembolic disease as the etiology of occlusion. Target systolic blood pressure of less than 202 systolic Right hip straight time 4 hours Frequent neurovascular checks Electronically Signed   By: Corrie Mckusick D.O.   On: 02/15/2021 10:01   IR ANGIO VERTEBRAL SEL VERTEBRAL BILAT MOD SED  Result Date: 02/15/2021 INDICATION: 35 year old male with a history of acute left-sided  weakness, ICA occlusion, possible dissection EXAM: ULTRASOUND-GUIDED RIGHT COMMON FEMORAL ARTERY ACCESS FOUR-VESSEL CERVICAL/CEREBRAL ANGIOGRAM ATTEMPTED MECHANICAL THROMBECTOMY RIGHT INTRACRANIAL ICA ANGIO-SEAL FOR HEMOSTASIS COMPARISON:  CT same day MEDICATIONS: None ANESTHESIA/SEDATION: The anesthesia team was present to provide general endotracheal tube anesthesia and for patient monitoring during the procedure. Intubation was performed in biplane room 2. left radial arterial line was performed by the anesthesia team. Interventional neuro radiology nursing staff was also present. CONTRAST:  100 cc FLUOROSCOPY TIME:  Fluoroscopy Time: 13 minutes 30 seconds (2,922 mGy). COMPLICATIONS: None TECHNIQUE: Informed written consent was obtained from the patient's family after a thorough discussion of the procedural risks, benefits and alternatives. Specific risks discussed include: Bleeding, infection, contrast reaction, kidney injury/failure, need for further procedure/surgery, arterial injury or dissection, embolization to new territory, intracranial hemorrhage (10-15% risk), neurologic deterioration, cardiopulmonary collapse, death. All questions were addressed. Maximal Sterile Barrier Technique was utilized including during the procedure including caps, mask, sterile gowns, sterile gloves, sterile drape, hand hygiene and skin antiseptic. A timeout was performed prior to the initiation of the procedure. The anesthesia team was present to provide general endotracheal tube anesthesia and for patient monitoring during the procedure. Interventional neuro radiology nursing staff was also present. FINDINGS: Initial Findings: Ultrasound of the right inguinal region demonstrates patent right common femoral artery, without significant atherosclerotic plaque. No significant tortuosity of the branch vessels. Right vertebral artery: Normal course caliber and contour of the cervical segment of the right vertebral artery, with  patent cervical branches. Anterior cerebral artery visualized with contribution from the lower cervical segment reticular medullary branches as well as the intracranial right vertebral artery. Mild tortuosity of the right V4 segment. No significant atherosclerotic changes of the basilar artery. Slip stream artifact decreases the resolution of the basilar artery. Proximal right and left PCA are patent. There is decreased capillary/parenchymal perfusion on the lateral view within the cuneus. Right common carotid artery:  Normal course caliber and contour. Right external carotid artery: Patent with antegrade flow. Right internal carotid artery: Decreased caliber in the contrast column of the right cervical ICA, with slow filling throughout the length of the right cervical ICA to the skull base. The contrast column terminates into the ophthalmic artery, with occlusion just beyond the ophthalmic artery. There is no spiraling filling defect that would  confirm a dissection flap. Right MCA: No filling of the right MCA branches, secondary to occlusion in the supra ophthalmic segment. Right ACA: No filling of the ACA branches, given the occlusion in the siphon. Left vertebral artery: Left PCA: Left common carotid artery:  Normal course caliber and contour. Left external carotid artery: Patent with antegrade flow. Left internal carotid artery: Normal course caliber and contour of the cervical portion. Vertical and petrous segment patent with normal course caliber contour. Cavernous segment patent. Clinoid segment patent. Antegrade flow of the ophthalmic artery. Ophthalmic segment patent. Terminus patent. Left MCA: M1 segment patent. Insular and opercular segments patent. Unremarkable caliber and course of the cortical segments. Typical arterial, capillary/ parenchymal, and venous phase. There is rapid left-to-right filling across a patent anterior communicating artery, with complete filling of the right anterior cerebral  artery territory and the right MCA cerebral territory. There is slight delay throughout all phases of the early, late arterial phase, capillary phase, and parenchymal phase as well as the venous phase compared to the left secondary to the cross fill. The watershed of the [place] and MCA territory is filling, though with delay that corresponds to preoperative CT perfusion imaging. No MCA branch occlusion identified. No significant retrograde filling of the ICA terminus. Left ACA: A 1 segment patent. A 2 segment perfuses the right territory. Completion Findings: Initial TICI of the right carotid siphon: 0 Final TICI of the right carotid siphon: 0 Flat panel CT reveals no intracranial hemorrhage PROCEDURE: The anesthesia team was present to provide general endotracheal tube anesthesia and for patient monitoring during the procedure. Intubation was performed in neuro angio biplane room. Interventional neuro radiology nursing staff was also present. Ultrasound survey of the right inguinal region was performed with images stored and sent to PACs. 11 blade scalpel was used to make a small incision. Blunt dissection was performed with US guidance. A micropuncture needle was used access the right common femoral artery under ultrasound. With excellent arterial blood flow returned, an .018 micro wire was passed through the needle, observed to enter the abdominal aorta under fluoroscopy. The needle was removed, and a micropuncture sheath was placed over the wire. The inner dilator and wire were removed, and an 035 wire was advanced under fluoroscopy into the abdominal aorta. A standard 5 French sheath was placed, aspirated and flushed. JB 1 catheter was then advanced on the diagnostic wire, advanced to the proximal descending thoracic aorta. Wire was removed and a double flush was performed. A complete cerebral/cervical angiogram was then performed using standard technique with the assistance of a Glidewire, for selection of the  right vertebral artery, right common carotid artery/right cervical ICA, left carotid artery, left vertebral artery. The JB 1 catheter was then withdrawn into the proximal descending thoracic aorta, flushed, with flow switch applied. Images were then reviewed with neurology team. After review of the images we elected to proceed with attempted aspiration at the site of occlusion. JB 1 catheter was navigated on the Glidewire into the distal right cervical ICA. Glidewire was removed and a rose in wire was placed into the cervical ICA. JB 1 was removed. The 5 French sheath was removed and a 25cm 68F straight vascular sheath was placed. The dilator was removed and the sheath was flushed. Sheath was attached to pressurized and heparinized saline bag for constant forward flow. A 95cm 087 "Walrus" balloon guide with coaxial 125cm Berenstein diagnostic catheter was advanced over the Rose an wire to the distal cervical ICA. Double flush  of the balloon guide was performed, and attached to rotating hemostatic valve and a pressure bag. Angiogram was performed. Road map function was used once the occluded vessel was identified. A coaxial system was then advanced through the balloon catheter, which included the selected intermediate catheter, microcatheter, and microwire. In this scenario, the set up included a zoom 71 aspiration catheter, a Trevo Trak microcatheter, and 014 synchro soft wire. This system was advanced through the balloon guide catheter under the road-map function, with adequate back-flush at the rotating hemostatic valve at that back end of the balloon guide. Coaxial system was gently advanced through the cavernous segment, to the level of the ophthalmic artery origin. The microwire and microcatheter were removed when the tip of the aspiration catheter was just proximal to the site of occlusion. Constant aspiration was then initiated using the proprietary engine at the hub of the zoom aspiration catheter. We  confirmed continuous return of blood, and then advanced the aspiration catheter gently into the occlusion site beyond the ophthalmic artery origin. The intention was to gently advanced the catheter, and avoid advancing the catheter into the ICA terminus/proximal MCA, given the uncertainty of the source of occlusion (embolus versus dissection). Once we confirmed cessation of return of blood flow as we advanced the catheter, we observed a 30 second time interval with forward pressure on the aspiration catheter, and then withdrew the aspiration catheter through the cavernous segment and into the balloon guide. Free aspiration of blood was observed at the hub of the aspiration catheter. Repeat angiogram was performed demonstrating that the occlusion persisted. 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 Catheter and balloon guide were removed. The skin at the puncture site was then cleaned with Chlorhexidine. The 8 French sheath was removed and an 40F angioseal was deployed. Flat panel CT was performed. Patient tolerated the procedure well and remained hemodynamically stable throughout. No complications were encountered and no significant blood loss encountered. 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 Angio-Seal deployed for hemostasis. Cervical angiogram reveals decreased caliber of the right cervical ICA, without definitive dissection flap. Cerebral angiogram confirms adequate left to right perfusion of the right-sided MCA territory branches with no MCA  branch occlusion. No proximal occlusion identified within the right PCA Signed, Dulcy Fanny. Dellia Nims, [facility] Radiology PLAN: The patient will remain intubated. ICU status Urgent MRI/MRA, as diagnostic tool for right carotid siphon occlusion, to determine extent of infarction, as well as if there is obvious dissection flap versus thromboembolic disease as the etiology of occlusion. Target systolic blood pressure of less than 366 systolic Right hip straight time 4 hours Frequent neurovascular checks Electronically Signed   By: Corrie Mckusick D.O.   On: 02/15/2021 10:01       HISTORY OF PRESENT ILLNESS   David Craig is a 35 y.o. male with a past medical history significant for polysubstance abuse (cocaine, tobacco, vaping, oxycodone), migraine headaches, obesity (BMI 34.97).    He was having his typical migraine today, but this was not unusual for him and otherwise he had been in his normal state of health --  he had called out of work due to his headache. Last reported cocaine use 1 week ago but fairly regular marijuana use at least several times a week.  He reported slipping in the shower due to generally slippery conditions, and initially reported to the ED provider that he had gotten back to his bed normally, but then later reported to me that he had significant difficulty getting back to bed due to left-sided weakness.  ED provider activated a code stroke on arrival appropriately given the left-sided deficits and right gaze preference.   He was initially activated as a code IR for his high NIH stroke scale and favorable ASPECTS score and CT perfusion profile.  While his initial CTA was felt to be most consistent with a dissection his diagnostic cerebral angiogram did not clearly show a dissection, though there was interruption of flow in the right carotid artery after the takeoff of the ophthalmic artery through to the ICA terminus, with good flow  across the circle of Willis to the right MCA.  One aspiration attempt was made without successful retrieval of the clot.  Given lack of clarity on the safety of further intervention (aggressive attempts at aspiration in the setting of a dissection would be much more risky than for a simple embolic clot), decision was made to attempt MRI/MRA to clarify dissection versus thrombus.  While not definitive, this imaging does favor likely thrombus.  Imaging also revealed that the patient has had a significant right PCA stroke which was felt to explain his deficits on examination; MRI confirmed that there did not seem to be significant right MCA territory cortical ischemia based on DWI imaging.       HOSPITAL COURSE [name] is a 35 y.o. male with history of polysubstance abuse (cocaine, THC, opiates, vaping) presenting with left hemiparesis and right gaze preference, found to have right PCA territory ischemic infarcts. Out of window for alteplase. 1 pass endovascular treatment, TICI 0. Procedure aborted due to robust collateral flow to MCA territory from left side circulation. Patient has fetal PCAs, explaining PCA stroke from anterior circulation thrombus.    Stroke  right PCA infarct, secondary to terminal right ICA occlusion s/p failed mechanical thrombectomy etiology likely secondary to cocaine use/vasospasm Code Stroke CT head No acute abnormality.. ASPECTS 10.  CTA head & neck : questionable right ICA dissection at cavernous segment with more distal siphon occlusion, right proximal Pcomm and right proximal P2 occlusions  CT perfusion large area of penumbra without large core  Cerebral angio no discernable dissection flap but with decreased caliber right cervical ICA. Left to right perfusion of right sided MAC territory branches without MCA branch occlusion and no proximal occlusion of right PCA  MRI  multifocal ischemic infarcts of right PCA territory involving occipital, medical temporal,  posterior limb of internal capsule, and ventrolateral thalamic regions.  MRA , after angio right P1 occlusion , right Ica occlusion  2D Echo LVEF 65-70%, no regional wall motion abnormalities, mild LVH  Transcranial Doppler study reveals grade 1 PFO Hypercoag work up PENDING TEE : results pending Lower extremity venous Dopplers negative for DVT LDL 126. Placed on high intensity statin, not on home medication, atorv 80 qd  HgbA1c 5.0 VTE prophylaxis - enoxaparin 40 qd  No antithrombotic prior to admission, now on aspirin 81 mg daily and clopidogrel 75 mg daily. X 90 days, then switch to monotherapy with asa alone. Therapy recommendations:  CIR Disposition:  CIR today   Hypertension Home meds:  none  Stable off cleviprex Long-term BP goal normotensive   Hyperlipidemia Home meds:  none, added atorvastatin 80 mg LDL 126, goal < 70 Continue statin at discharge   Other Stroke Risk Factors Cigarette smoker, vapes, THC, cocaine, advised to stop using  ETOH use, alcohol level <10, advised to drink no more than 1-2 drink(s) a day Substance abuse - UDS:  THC POSITIVE, Cocaine POSITIVE. Patient advised to stop using due to stroke risk. Migraines: advised against triptans    Other Active Problems Dysphagia: SLP following, passed for dysphagia 2 diet Respiratory failure in setting of acute ischemic infarct: extubated successfully and now resolved.  DISCHARGE EXAM Blood pressure 119/78, pulse 68, temperature 98 F (36.7 C), temperature source Oral, resp. rate 18, height _0  (1.727 m), weight 104.3 kg, SpO2 97 %.   Neurological Exam  Awake and alert.  Right gaze preference but able to look to the left past midline.  Dense left homonymous hemianopsia.  Left lower facial moderate weakness. Tongue midline.  Left hemiplegia improving with some antigravity distally in LLE.  Flaccidity and hypotonia on the left UE.   Good antigravity strength on the right.  Diminished left hemibody sensation but no  neglect.  Gait deferred  Discharge Diet      Diet   Diet regular Room service appropriate? Yes; Fluid consistency: Thin   liquids  DISCHARGE PLAN Disposition:  Transfer to [place] for ongoing PT, OT and ST aspirin 81 mg daily and clopidogrel 75 mg daily for secondary stroke prevention for 3 months then aspirin alone. Recommend ongoing stroke risk factor control by Primary Care Physician at time of discharge from inpatient rehabilitation. Follow-up PCP Patient, No Pcp Per (Inactive) in 2 weeks following discharge from rehab. Follow-up in [facility] in 4 weeks following discharge from rehab, office to schedule an appointment.     35 minutes were spent preparing discharge.   ATTENDING NOTE: I reviewed above note and agree with the assessment and plan. Pt was seen and examined.   No acute event overnight, neuro stable.  Continue to improve left lower extremity, still has plegic on the left upper extremity.  PT/OT recommend CIR, discharge to CIR today.  For detailed assessment and plan, please refer to above as I have made changes wherever appropriate.   Rosalin Hawking, MD PhD Stroke Neurology 02/22/2021 10:08 PM

## 2021-02-22 NOTE — Progress Notes (Signed)
Discussed with wife safety measures of turning off bed alarm and sleeping in the bed with patient, states she understood, Support provided

## 2021-02-22 NOTE — H&P (Signed)
Physical Medicine and Rehabilitation Admission H&P        Chief Complaint  Patient presents with   Fall  : HPI: David Craig is a 35 y/o right handed male with history of polysubstance abuse/tobacco ,migrane headaches and obesity with BMI 34.97.  Per chart review patient lives with spouse and 2 young children.  Reportedly independent prior to admission.  Working in Airline pilot for [employer].  Presented 02/14/2021 with left-sided weakness of acute onset as well as headache.  Cranial CT scan negative.  CT angiogram head and neck showed suspected dissection of the distal right internal carotid artery beginning at the distal cavernous segment, which became occluded proximal to the carotid terminus.  The right middle and anterior cerebral arteries fill via flow across the anterior communicating artery.  Large area of ischemic penumbra within the right hemisphere in a watershed/hypoperfusion type distribution.  Patient did not receive tPA.  Admission chemistries unremarkable except glucose 131, WBC 13,900, urine drug screen positive cocaine as well as marijuana.  Attempts of mechanical thrombectomy for right ICA occlusion failed per interventional radiology.  MRI showed multifocal ischemic infarcts of right PCA territory involving occipital, medial temporal, posterior limb of internal capsule and ventrolateral thalamic regions.  Echocardiogram with ejection fraction of 65 to 70% no wall motion abnormality.  Lower extremity Dopplers negative.  Transcranial Doppler study revealed grade 1 PFO.  TEE completed 02/21/2021 showing no thrombus or mass small PFO.  Currently maintained on aspirin 81 mg daily and Plavix 75 mg daily x90 days then aspirin alone.  Subcutaneous Lovenox for DVT prophylaxis.  His diet has been advanced to regular consistency.  Therapy evaluations completed due to patient's left-sided weakness was admitted for a comprehensive rehab program.   Review of Systems  Constitutional:  Negative for  chills and fever.  HENT:  Negative for hearing loss.   Eyes:  Negative for blurred vision and double vision.  Respiratory:  Negative for cough and shortness of breath.   Cardiovascular:  Negative for chest pain, palpitations and leg swelling.  Gastrointestinal:  Positive for constipation. Negative for heartburn, nausea and vomiting.  Genitourinary:  Negative for dysuria, flank pain and hematuria.  Musculoskeletal:  Positive for myalgias.  Skin:  Negative for rash.  Neurological:  Positive for weakness and headaches.  All other systems reviewed and are negative. History reviewed. No pertinent past medical history.      Past Surgical History:  Procedure Laterality Date   BUBBLE STUDY   02/21/2021    Procedure: BUBBLE STUDY;  Surgeon: Chilton Si, MD;  Location: [facility] ENDOSCOPY;  Service: Cardiovascular;;   IR ANGIO INTRA EXTRACRAN SEL COM CAROTID INNOMINATE UNI L MOD SED   02/15/2021   IR ANGIO VERTEBRAL SEL VERTEBRAL BILAT MOD SED   02/15/2021   IR CT HEAD LTD   02/15/2021   IR PERCUTANEOUS ART THROMBECTOMY/INFUSION INTRACRANIAL INC DIAG ANGIO   02/15/2021   IR US GUIDE VASC ACCESS RIGHT   02/15/2021   RADIOLOGY WITH ANESTHESIA N/A 02/14/2021    Procedure: IR WITH ANESTHESIA;  Surgeon: Julieanne Cotton, MD;  Location: [facility] OR;  Service: Radiology;  Laterality: N/A;   TEE WITHOUT CARDIOVERSION N/A 02/21/2021    Procedure: TRANSESOPHAGEAL ECHOCARDIOGRAM (TEE);  Surgeon: Chilton Si, MD;  Location: [facility] ENDOSCOPY;  Service: Cardiovascular;  Laterality: N/A;    History reviewed. No pertinent family history. Social History:  reports that he quit smoking about 5 years ago. He has never used smokeless tobacco. He reports current alcohol use. He  reports that he does not use drugs. Allergies: No Known Allergies       Medications Prior to Admission  Medication Sig Dispense Refill   Cholecalciferol (VITAMIN D3) 1.25 MG (50000 UT) CAPS Take 1.25 mg by mouth daily.       ibuprofen (ADVIL,MOTRIN)  800 MG tablet Take 1 tablet (800 mg total) by mouth 3 (three) times daily. 21 tablet 0   Multiple Vitamin (MULTIVITAMIN PO) Take 1 tablet by mouth daily.          Drug Regimen Review Drug regimen was reviewed and remains appropriate with no significant issues identified   Home: Home Living Family/patient expects to be discharged to:: Private residence Living Arrangements: Spouse/significant other, Children (1 and 3 yo) Available Help at Discharge: Family, Available 24 hours/day Type of Home: House Home Access: Stairs to enter Secretary/administrator of Steps: 4 Entrance Stairs-Rails: Left Home Layout: One level Bathroom Shower/Tub: Tub/shower unit, Engineer, building services: Standard Home Equipment: Hand held shower head Additional Comments: 2 children - 48 and 32 years old  Lives With: Spouse   Functional History: Prior Function Level of Independence: Independent Comments: working in Airline pilot for [employer]   Functional Status:  Mobility: Bed Mobility Overal bed mobility: Needs Assistance Bed Mobility: Rolling, Sidelying to Sit Rolling: Mod assist Sidelying to sit: Mod assist Supine to sit: Mod assist, HOB elevated General bed mobility comments: mod A for verbal cues to appropriate placement of LUE (pt dependently movign LUE with RUE), and mod A for physically assisting pt with BLE off of the bed and pushing into sitting Transfers Overall transfer level: Needs assistance Equipment used: Rolling walker (2 wheeled) Transfer via Lift Equipment: Stedy Transfers: Sit to/from Stand, [employer] Sit to Stand: Mod assist (+1) Stand pivot transfers: Mod assist (+1)  Lateral/Scoot Transfers: Min assist, +2 physical assistance, +2 safety/equipment General transfer comment: Pt completed 10x sit<>stand with +1 assist ranging from minA-Mod A for L knee blocking and phsyical lifting. he required mod A for squat pivot +1 for verbal cues of hand placement and phsyical assist of L  side. Ambulation/Gait Ambulation/Gait assistance: Max assist, +2 physical assistance Gait Distance (Feet): 2 Feet Assistive device: 4-wheeled walker Gait Pattern/deviations: Step-to pattern, Decreased stance time - left, Decreased step length - left, Decreased dorsiflexion - left General Gait Details: worked on sequencing steps, pt dependent of PT to hold L hand on walker, PT with tactile cues and modA to advance L LE, cues at knee to flex during swing phase and complete quad set in stand phase Gait velocity: slow Gait velocity interpretation: <1.8 ft/sec, indicate of risk for recurrent falls   ADL: ADL Overall ADL's : Needs assistance/impaired Eating/Feeding: Minimal assistance, Cueing for compensatory techinques, Sitting Eating/Feeding Details (indicate cue type and reason): assist to cut and set up tray.  Cues to find items on L of tray and not pocket on the left. Grooming: Wash/dry hands, Oral care, Moderate assistance, Standing, Cueing for compensatory techniques Grooming Details (indicate cue type and reason): educated family on how to assist pt with grooming tasks to functionally work on his deficits: place grooming supplies on empty table (eliminate distractions), place supplies at midline and eventually work towards the L to force pt to scan into his L fields Upper Body Bathing: Moderate assistance Upper Body Bathing Details (indicate cue type and reason): supported sitting Lower Body Bathing: Total assistance Lower Body Bathing Details (indicate cue type and reason): Mod A+2 sit<>stand Upper Body Dressing : Maximal assistance, Sitting Upper  Body Dressing Details (indicate cue type and reason): Pt impulsive during task and forgetting LUE. Required max vc to place LUE into gown first, and then proceed with R arm. Lower Body Dressing: Total assistance Lower Body Dressing Details (indicate cue type and reason): pt donned R sock with mod assist for balance and L sock with mod assist x2  to remain sitting and don sock. Toilet Transfer: Moderate assistance, Squat-pivot, [facility] Toilet Transfer Details (indicate cue type and reason): simulated from bed>recliner. vc for hand placement, mod physical assit Toileting- Clothing Manipulation and Hygiene: Total assistance Toileting - Clothing Manipulation Details (indicate cue type and reason): pt soiled wtih urine upon arrival, unaware - incontinent Functional mobility during ADLs: Moderate assistance, +2 for physical assistance, +2 for safety/equipment, Cueing for safety General ADL Comments: pt requried heavy cueing throughout all tasks to attend to L extremeties, to sustain attention to task, to eleminate inappropriate behaviors and for implusivity. mod-max physical assist for mobility throughout   Cognition: Cognition Overall Cognitive Status: Impaired/Different from baseline Arousal/Alertness: Awake/alert (adequately awake) Orientation Level: Oriented X4 Attention: Sustained Sustained Attention: Impaired Sustained Attention Impairment: Verbal basic Memory: Impaired Memory Impairment: Storage deficit, Retrieval deficit Awareness: Impaired Awareness Impairment: Anticipatory impairment, Emergent impairment Problem Solving:  (will assess higher level) Behaviors: Impulsive Safety/Judgment: Other (comment) (suspect impaired) Cognition Arousal/Alertness: Awake/alert Behavior During Therapy: Impulsive Overall Cognitive Status: Impaired/Different from baseline Area of Impairment: Attention, Following commands, Safety/judgement, Awareness, Problem solving Current Attention Level: Sustained Memory: Decreased short-term memory Following Commands: Follows one step commands consistently Safety/Judgement: Decreased awareness of deficits Awareness: Emergent Problem Solving: Requires verbal cues, Decreased initiation General Comments: Pt requires vc's to attend to L side, and safely move L extremeties. Pt was inappropriately impulsive, and  needed re-direction to tasks. pt completed clock draw and letter cancelation with about 10% accuracy   Physical Exam: Blood pressure 134/84, pulse 65, temperature 98.1 F (36.7 C), temperature source Oral, resp. rate 16, height 5\' 8"  (1.727 m), weight 104.3 kg, SpO2 97 %. Physical Exam Constitutional:      General: He is not in acute distress. HENT:     Head: Normocephalic and atraumatic.     Right Ear: External ear normal.     Left Ear: External ear normal.     Nose: Nose normal.  Eyes:     Pupils: Pupils are equal, round, and reactive to light.  Cardiovascular:     Rate and Rhythm: Normal rate and regular rhythm.     Heart sounds: No murmur heard.   No gallop.  Pulmonary:     Effort: Pulmonary effort is normal. No respiratory distress.     Breath sounds: No wheezing.  Abdominal:     General: There is no distension.     Palpations: Abdomen is soft.     Tenderness: There is no abdominal tenderness.  Musculoskeletal:        General: No swelling or tenderness.     Cervical back: Normal range of motion.  Skin:    General: Skin is warm and dry.  Neurological:     Mental Status: He is alert.     Comments: Patient is awake alert.  He has a right gaze preference.  Follows commands.  Provides name and age and date. Left central 7 and dysarthria. Left HH and lateral eye movement slow with left lateral gaze. Senses pain but light touch may be sl diminished LUE and LLE. LUE 1+ delt,pec and 0/5 otherwise. LLE: 1+ to 2- HE, KE and 0/5 otherwise.  No resting tone. DTR's absent  Psychiatric:     Comments: Flat but generally pleasant and cooperative      Lab Results Last 48 Hours        Results for orders placed or performed during the hospital encounter of 02/14/21 (from the past 48 hour(s))  CBC     Status: None    Collection Time: 02/21/21  7:07 AM  Result Value Ref Range    WBC 7.3 4.0 - 10.5 K/uL    RBC 5.22 4.22 - 5.81 MIL/uL    Hemoglobin 16.1 13.0 - 17.0 g/dL    HCT 44.0 34.7 -  42.5 %    MCV 87.5 80.0 - 100.0 fL    MCH 30.8 26.0 - 34.0 pg    MCHC 35.2 30.0 - 36.0 g/dL    RDW 95.6 38.7 - 56.4 %    Platelets 162 150 - 400 K/uL    nRBC 0.0 0.0 - 0.2 %      Comment: Performed at [facility], [address], [place], [place] [address]  Basic metabolic panel     Status: Abnormal    Collection Time: 02/21/21  7:07 AM  Result Value Ref Range    Sodium 139 135 - 145 mmol/L    Potassium 4.2 3.5 - 5.1 mmol/L    Chloride 105 98 - 111 mmol/L    CO2 25 22 - 32 mmol/L    Glucose, Bld 107 (H) 70 - 99 mg/dL      Comment: Glucose reference range applies only to samples taken after fasting for at least 8 hours.    BUN 11 6 - 20 mg/dL    Creatinine, Ser 1.88 0.61 - 1.24 mg/dL    Calcium 9.1 8.9 - 41.6 mg/dL    GFR, Estimated >60 >63 mL/min      Comment: (NOTE) Calculated using the CKD-EPI Creatinine Equation (2021)      Anion gap 9 5 - 15      Comment: Performed at [facility], [address], [place], [place] [address]  CBC     Status: None    Collection Time: 02/22/21  3:39 AM  Result Value Ref Range    WBC 7.7 4.0 - 10.5 K/uL    RBC 5.18 4.22 - 5.81 MIL/uL    Hemoglobin 16.0 13.0 - 17.0 g/dL    HCT 09.3 23.5 - 57.3 %    MCV 87.6 80.0 - 100.0 fL    MCH 30.9 26.0 - 34.0 pg    MCHC 35.2 30.0 - 36.0 g/dL    RDW 22.0 25.4 - 27.0 %    Platelets 166 150 - 400 K/uL    nRBC 0.0 0.0 - 0.2 %      Comment: Performed at [facility], [address] [place], [place] [address]       Imaging Results (Last 48 hours)  ECHO TEE   Result Date: 02/21/2021    TRANSESOPHOGEAL ECHO REPORT   Patient Name:   David Craig Date of Exam: 02/21/2021 Medical Rec #:  [id number]    Height:       68.0 in Accession #:    [id number]   Weight:       230.0 lb Date of Birth:  06-18-1985    BSA:          2.169 m Patient Age:    35 years     BP:  116/90 mmHg Patient Gender: M            HR:           80 bpm. Exam Location:  Inpatient Procedure: Transesophageal Echo, Cardiac  Doppler, Color Doppler and 3D Echo Indications:     CVA  History:         Patient has prior history of Echocardiogram examinations, most                  recent 02/15/2021. Risk Factors:Hypertension and Dyslipidemia.                  Polysubstance abuse.  Sonographer:     Leta Jungling RDCS Referring Phys:  [id number] Corrin Parker Diagnosing Phys: Chilton Si MD PROCEDURE: After discussion of the risks and benefits of a TEE, an informed consent was obtained from the patient. TEE procedure time was 15 minutes. The transesophogeal probe was passed without difficulty through the esophogus of the patient. Imaged were obtained with the patient in a left lateral decubitus position. Sedation performed by different physician. The patient was monitored while under deep sedation. Anesthestetic sedation was provided intravenously by Anesthesiology: 332.04mg  of Propofol. Image quality was good. The patient's vital signs; including heart rate, blood pressure, and oxygen saturation; remained stable throughout the procedure. The patient developed no complications during the procedure. IMPRESSIONS  1. Left ventricular ejection fraction, by estimation, is 60 to 65%. The left ventricle has normal function. The left ventricle has no regional wall motion abnormalities. Left ventricular diastolic function could not be evaluated.  2. Right ventricular systolic function is normal. The right ventricular size is normal.  3. No left atrial/left atrial appendage thrombus was detected.  4. The mitral valve is normal in structure. Trivial mitral valve regurgitation. No evidence of mitral stenosis.  5. The aortic valve is normal in structure. Aortic valve regurgitation is not visualized. No aortic stenosis is present.  6. Evidence of atrial level shunting detected by color flow Doppler. Agitated saline contrast bubble study was positive with shunting observed within 3-6 cardiac cycles suggestive of interatrial shunt. There is a small  patent foramen ovale with predominantly right to left shunting across the atrial septum. Conclusion(s)/Recommendation(s): Normal biventricular function without evidence of hemodynamically significant valvular heart disease. Findings are concerning for an interatrial shunt as detailed above. FINDINGS  Left Ventricle: Left ventricular ejection fraction, by estimation, is 60 to 65%. The left ventricle has normal function. The left ventricle has no regional wall motion abnormalities. The left ventricular internal cavity size was normal in size. There is  no left ventricular hypertrophy. Left ventricular diastolic function could not be evaluated. Right Ventricle: The right ventricular size is normal. No increase in right ventricular wall thickness. Right ventricular systolic function is normal. Left Atrium: Left atrial size was normal in size. No left atrial/left atrial appendage thrombus was detected. Right Atrium: Right atrial size was normal in size. Pericardium: There is no evidence of pericardial effusion. Mitral Valve: The mitral valve is normal in structure. Trivial mitral valve regurgitation. No evidence of mitral valve stenosis. Tricuspid Valve: The tricuspid valve is normal in structure. Tricuspid valve regurgitation is trivial. No evidence of tricuspid stenosis. Aortic Valve: The aortic valve is normal in structure. Aortic valve regurgitation is not visualized. No aortic stenosis is present. Pulmonic Valve: The pulmonic valve was normal in structure. Pulmonic valve regurgitation is not visualized. No evidence of pulmonic stenosis. Aorta: The aortic root is normal in size and structure. IAS/Shunts: Evidence  of atrial level shunting detected by color flow Doppler. Agitated saline contrast was given intravenously to evaluate for intracardiac shunting. Agitated saline contrast bubble study was positive with shunting observed within 3-6 cardiac cycles suggestive of interatrial shunt. A small patent foramen ovale is  detected with predominantly right to left shunting across the atrial septum. There is no evidence of an atrial septal defect.  TRICUSPID VALVE TR Peak grad:   20.4 mmHg TR Vmax:        226.00 cm/s Chilton Si MD Electronically signed by Chilton Si MD Signature Date/Time: 02/21/2021/7:17:35 PM    Final              Medical Problem List and Plan: 1.  Left-sided weakness secondary to right PCA infarction secondary to terminal right ICA occlusion status post failed mechanical thrombectomy etiology likely secondary to cocaine use/vasospasm             -patient may  shower             -ELOS/Goals: 24-28 days with min assist to supervision goals 2.  Antithrombotics: -DVT/anticoagulation: Lower extremity Dopplers negative Pharmaceutical: Lovenox             -antiplatelet therapy: Aspirin 81 mg daily and Plavix 75 mg daily x90 days then aspirin alone 3. Pain Management: Tylenol as needed 4. Mood: Provide emotional support             -antipsychotic agents: N/A 5. Neuropsych: This patient is capable of making decisions on his own behalf. 6. Skin/Wound Care: Routine skin checks. Encourage appropriate nutrition 7. Fluids/Electrolytes/Nutrition: Routine in and outs with follow-up chemistries 8.  Polysubstance abuse.  Provide counseling 9.  Hyperlipidemia.  Lipitor  10.  Obesity.  BMI 34.97.  Dietary follow-up 11. Dysphagia: showing improvement. Has been advanced to regular diet but still dysarthric and obvious oro-motor control issues. Monitor for now             -make sure patient is up and at 90 degrees to eat             -small bites, take extra time, etc       Charlton Amor, PA-C 02/22/2021   I have personally performed a face to face diagnostic evaluation of this patient and formulated the key components of the plan.  Additionally, I have personally reviewed laboratory data, imaging studies, as well as relevant notes and concur with the physician assistant's documentation  above.  The patient's status has not changed from the original H&P.  Any changes in documentation from the acute care chart have been noted above.  Ranelle Oyster, MD, [place]

## 2021-02-22 NOTE — Progress Notes (Signed)
Inpatient Rehabilitation Medication Review by a Pharmacist  A complete drug regimen review was completed for this patient to identify any potential clinically significant medication issues.  High Risk Drug Classes Is patient taking? Indication by Medication  Antipsychotic No   Anticoagulant Yes  Lovenox for VTE ppx  Antibiotic No   Opioid No   Antiplatelet Yes Aspirin and Plavix for CVA  Hypoglycemics/insulin No   Vasoactive Medication No   Chemotherapy No   Other No      Type of Medication Issue Identified Description of Issue Recommendation(s)  Drug Interaction(s) (clinically significant)     Duplicate Therapy     Allergy     No Medication Administration End Date     Incorrect Dose     Additional Drug Therapy Needed     Significant med changes from prior encounter (inform family/care partners about these prior to discharge).    Other       Clinically significant medication issues were identified that warrant physician communication and completion of prescribed/recommended actions by midnight of the next day:  No  Pharmacist comments: None  Time spent performing this drug regimen review (minutes):  20 minutes  Thank you Okey Regal, PharmD  02/22/2021 4:55 PM

## 2021-02-22 NOTE — Plan of Care (Signed)
  Problem: Education: Goal: Knowledge of disease or condition will improve Outcome: Progressing Goal: Knowledge of secondary prevention will improve Outcome: Progressing Goal: Knowledge of patient specific risk factors addressed and post discharge goals established will improve Outcome: Progressing Goal: Individualized Educational Video(s) Outcome: Progressing   Problem: Coping: Goal: Will verbalize positive feelings about self Outcome: Progressing Goal: Will identify appropriate support needs Outcome: Progressing   Problem: Health Behavior/Discharge Planning: Goal: Ability to manage health-related needs will improve Outcome: Progressing   Problem: Self-Care: Goal: Ability to participate in self-care as condition permits will improve Outcome: Progressing Goal: Verbalization of feelings and concerns over difficulty with self-care will improve Outcome: Progressing Goal: Ability to communicate needs accurately will improve Outcome: Progressing   Problem: Nutrition: Goal: Risk of aspiration will decrease Outcome: Progressing Goal: Dietary intake will improve Outcome: Progressing   Problem: Ischemic Stroke/TIA Tissue Perfusion: Goal: Complications of ischemic stroke/TIA will be minimized Outcome: Progressing   Problem: Safety: Goal: Non-violent Restraint(s) Outcome: Progressing   

## 2021-02-22 NOTE — H&P (Signed)
Physical Medicine and Rehabilitation Admission H&P    Chief Complaint  Patient presents with   Fall  : HPI: David Craig is a 35 y/o right handed male with history of polysubstance abuse/tobacco ,migrane headaches and obesity with BMI 34.97.  Per chart review patient lives with spouse and 2 young children.  Reportedly independent prior to admission.  Working in Airline pilot for [employer].  Presented 02/14/2021 with left-sided weakness of acute onset as well as headache.  Cranial CT scan negative.  CT angiogram head and neck showed suspected dissection of the distal right internal carotid artery beginning at the distal cavernous segment, which became occluded proximal to the carotid terminus.  The right middle and anterior cerebral arteries fill via flow across the anterior communicating artery.  Large area of ischemic penumbra within the right hemisphere in a watershed/hypoperfusion type distribution.  Patient did not receive tPA.  Admission chemistries unremarkable except glucose 131, WBC 13,900, urine drug screen positive cocaine as well as marijuana.  Attempts of mechanical thrombectomy for right ICA occlusion failed per interventional radiology.  MRI showed multifocal ischemic infarcts of right PCA territory involving occipital, medial temporal, posterior limb of internal capsule and ventrolateral thalamic regions.  Echocardiogram with ejection fraction of 65 to 70% no wall motion abnormality.  Lower extremity Dopplers negative.  Transcranial Doppler study revealed grade 1 PFO.  TEE completed 02/21/2021 showing no thrombus or mass small PFO.  Currently maintained on aspirin 81 mg daily and Plavix 75 mg daily x90 days then aspirin alone.  Subcutaneous Lovenox for DVT prophylaxis.  His diet has been advanced to regular consistency.  Therapy evaluations completed due to patient's left-sided weakness was admitted for a comprehensive rehab program.  Review of Systems  Constitutional:  Negative for chills  and fever.  HENT:  Negative for hearing loss.   Eyes:  Negative for blurred vision and double vision.  Respiratory:  Negative for cough and shortness of breath.   Cardiovascular:  Negative for chest pain, palpitations and leg swelling.  Gastrointestinal:  Positive for constipation. Negative for heartburn, nausea and vomiting.  Genitourinary:  Negative for dysuria, flank pain and hematuria.  Musculoskeletal:  Positive for myalgias.  Skin:  Negative for rash.  Neurological:  Positive for weakness and headaches.  All other systems reviewed and are negative. History reviewed. No pertinent past medical history. Past Surgical History:  Procedure Laterality Date   BUBBLE STUDY  02/21/2021   Procedure: BUBBLE STUDY;  Surgeon: Chilton Si, MD;  Location: [facility] ENDOSCOPY;  Service: Cardiovascular;;   IR ANGIO INTRA EXTRACRAN SEL COM CAROTID INNOMINATE UNI L MOD SED  02/15/2021   IR ANGIO VERTEBRAL SEL VERTEBRAL BILAT MOD SED  02/15/2021   IR CT HEAD LTD  02/15/2021   IR PERCUTANEOUS ART THROMBECTOMY/INFUSION INTRACRANIAL INC DIAG ANGIO  02/15/2021   IR US GUIDE VASC ACCESS RIGHT  02/15/2021   RADIOLOGY WITH ANESTHESIA N/A 02/14/2021   Procedure: IR WITH ANESTHESIA;  Surgeon: Julieanne Cotton, MD;  Location: [facility] OR;  Service: Radiology;  Laterality: N/A;   TEE WITHOUT CARDIOVERSION N/A 02/21/2021   Procedure: TRANSESOPHAGEAL ECHOCARDIOGRAM (TEE);  Surgeon: Chilton Si, MD;  Location: [facility] ENDOSCOPY;  Service: Cardiovascular;  Laterality: N/A;   History reviewed. No pertinent family history. Social History:  reports that he quit smoking about 5 years ago. He has never used smokeless tobacco. He reports current alcohol use. He reports that he does not use drugs. Allergies: No Known Allergies Medications Prior to Admission  Medication Sig Dispense Refill  Cholecalciferol (VITAMIN D3) 1.25 MG (50000 UT) CAPS Take 1.25 mg by mouth daily.     ibuprofen (ADVIL,MOTRIN) 800 MG tablet Take 1 tablet (800  mg total) by mouth 3 (three) times daily. 21 tablet 0   Multiple Vitamin (MULTIVITAMIN PO) Take 1 tablet by mouth daily.      Drug Regimen Review Drug regimen was reviewed and remains appropriate with no significant issues identified  Home: Home Living Family/patient expects to be discharged to:: Private residence Living Arrangements: Spouse/significant other, Children (1 and 3 yo) Available Help at Discharge: Family, Available 24 hours/day Type of Home: House Home Access: Stairs to enter Secretary/administrator of Steps: 4 Entrance Stairs-Rails: Left Home Layout: One level Bathroom Shower/Tub: Tub/shower unit, Engineer, building services: Standard Home Equipment: Hand held shower head Additional Comments: 2 children - 64 and 11 years old  Lives With: Spouse   Functional History: Prior Function Level of Independence: Independent Comments: working in Airline pilot for [employer]  Functional Status:  Mobility: Bed Mobility Overal bed mobility: Needs Assistance Bed Mobility: Rolling, Sidelying to Sit Rolling: Mod assist Sidelying to sit: Mod assist Supine to sit: Mod assist, HOB elevated General bed mobility comments: mod A for verbal cues to appropriate placement of LUE (pt dependently movign LUE with RUE), and mod A for physically assisting pt with BLE off of the bed and pushing into sitting Transfers Overall transfer level: Needs assistance Equipment used: Rolling walker (2 wheeled) Transfer via Lift Equipment: Stedy Transfers: Sit to/from Stand, [employer] Sit to Stand: Mod assist (+1) Stand pivot transfers: Mod assist (+1)  Lateral/Scoot Transfers: Min assist, +2 physical assistance, +2 safety/equipment General transfer comment: Pt completed 10x sit<>stand with +1 assist ranging from minA-Mod A for L knee blocking and phsyical lifting. he required mod A for squat pivot +1 for verbal cues of hand placement and phsyical assist of L side. Ambulation/Gait Ambulation/Gait  assistance: Max assist, +2 physical assistance Gait Distance (Feet): 2 Feet Assistive device: 4-wheeled walker Gait Pattern/deviations: Step-to pattern, Decreased stance time - left, Decreased step length - left, Decreased dorsiflexion - left General Gait Details: worked on sequencing steps, pt dependent of PT to hold L hand on walker, PT with tactile cues and modA to advance L LE, cues at knee to flex during swing phase and complete quad set in stand phase Gait velocity: slow Gait velocity interpretation: <1.8 ft/sec, indicate of risk for recurrent falls    ADL: ADL Overall ADL's : Needs assistance/impaired Eating/Feeding: Minimal assistance, Cueing for compensatory techinques, Sitting Eating/Feeding Details (indicate cue type and reason): assist to cut and set up tray.  Cues to find items on L of tray and not pocket on the left. Grooming: Wash/dry hands, Oral care, Moderate assistance, Standing, Cueing for compensatory techniques Grooming Details (indicate cue type and reason): educated family on how to assist pt with grooming tasks to functionally work on his deficits: place grooming supplies on empty table (eliminate distractions), place supplies at midline and eventually work towards the L to force pt to scan into his L fields Upper Body Bathing: Moderate assistance Upper Body Bathing Details (indicate cue type and reason): supported sitting Lower Body Bathing: Total assistance Lower Body Bathing Details (indicate cue type and reason): Mod A+2 sit<>stand Upper Body Dressing : Maximal assistance, Sitting Upper Body Dressing Details (indicate cue type and reason): Pt impulsive during task and forgetting LUE. Required max vc to place LUE into gown first, and then proceed with R arm. Lower Body Dressing: Total assistance  Lower Body Dressing Details (indicate cue type and reason): pt donned R sock with mod assist for balance and L sock with mod assist x2 to remain sitting and don sock. Toilet  Transfer: Moderate assistance, Squat-pivot, Eating [facility] Toilet Transfer Details (indicate cue type and reason): simulated from bed>recliner. vc for hand placement, mod physical assit Toileting- Clothing Manipulation and Hygiene: Total assistance Toileting - Clothing Manipulation Details (indicate cue type and reason): pt soiled wtih urine upon arrival, unaware - incontinent Functional mobility during ADLs: Moderate assistance, +2 for physical assistance, +2 for safety/equipment, Cueing for safety General ADL Comments: pt requried heavy cueing throughout all tasks to attend to L extremeties, to sustain attention to task, to eleminate inappropriate behaviors and for implusivity. mod-max physical assist for mobility throughout  Cognition: Cognition Overall Cognitive Status: Impaired/Different from baseline Arousal/Alertness: Awake/alert (adequately awake) Orientation Level: Oriented X4 Attention: Sustained Sustained Attention: Impaired Sustained Attention Impairment: Verbal basic Memory: Impaired Memory Impairment: Storage deficit, Retrieval deficit Awareness: Impaired Awareness Impairment: Anticipatory impairment, Emergent impairment Problem Solving:  (will assess higher level) Behaviors: Impulsive Safety/Judgment: Other (comment) (suspect impaired) Cognition Arousal/Alertness: Awake/alert Behavior During Therapy: Impulsive Overall Cognitive Status: Impaired/Different from baseline Area of Impairment: Attention, Following commands, Safety/judgement, Awareness, Problem solving Current Attention Level: Sustained Memory: Decreased short-term memory Following Commands: Follows one step commands consistently Safety/Judgement: Decreased awareness of deficits Awareness: Emergent Problem Solving: Requires verbal cues, Decreased initiation General Comments: Pt requires vc's to attend to L side, and safely move L extremeties. Pt was inappropriately impulsive, and needed re-direction to tasks. pt  completed clock draw and letter cancelation with about 10% accuracy  Physical Exam: Blood pressure 134/84, pulse 65, temperature 98.1 F (36.7 C), temperature source Oral, resp. rate 16, height 5\' 8"  (1.727 m), weight 104.3 kg, SpO2 97 %. Physical Exam Constitutional:      General: He is not in acute distress. HENT:     Head: Normocephalic and atraumatic.     Right Ear: External ear normal.     Left Ear: External ear normal.     Nose: Nose normal.  Eyes:     Pupils: Pupils are equal, round, and reactive to light.  Cardiovascular:     Rate and Rhythm: Normal rate and regular rhythm.     Heart sounds: No murmur heard.   No gallop.  Pulmonary:     Effort: Pulmonary effort is normal. No respiratory distress.     Breath sounds: No wheezing.  Abdominal:     General: There is no distension.     Palpations: Abdomen is soft.     Tenderness: There is no abdominal tenderness.  Musculoskeletal:        General: No swelling or tenderness.     Cervical back: Normal range of motion.  Skin:    General: Skin is warm and dry.  Neurological:     Mental Status: He is alert.     Comments: Patient is awake alert.  He has a right gaze preference.  Follows commands.  Provides name and age and date. Left central 7 and dysarthria. Left HH and lateral eye movement slow with left lateral gaze. Senses pain but light touch may be sl diminished LUE and LLE. LUE 1+ delt,pec and 0/5 otherwise. LLE: 1+ to 2- HE, KE and 0/5 otherwise. No resting tone. DTR's absent  Psychiatric:     Comments: Flat but generally pleasant and cooperative    Results for orders placed or performed during the hospital encounter of 02/14/21 (from the past 48  hour(s))  CBC     Status: None   Collection Time: 02/21/21  7:07 AM  Result Value Ref Range   WBC 7.3 4.0 - 10.5 K/uL   RBC 5.22 4.22 - 5.81 MIL/uL   Hemoglobin 16.1 13.0 - 17.0 g/dL   HCT 67.5 91.6 - 38.4 %   MCV 87.5 80.0 - 100.0 fL   MCH 30.8 26.0 - 34.0 pg   MCHC 35.2  30.0 - 36.0 g/dL   RDW 66.5 99.3 - 57.0 %   Platelets 162 150 - 400 K/uL   nRBC 0.0 0.0 - 0.2 %    Comment: Performed at [facility], [address], [place], [place] [address]  Basic metabolic panel     Status: Abnormal   Collection Time: 02/21/21  7:07 AM  Result Value Ref Range   Sodium 139 135 - 145 mmol/L   Potassium 4.2 3.5 - 5.1 mmol/L   Chloride 105 98 - 111 mmol/L   CO2 25 22 - 32 mmol/L   Glucose, Bld 107 (H) 70 - 99 mg/dL    Comment: Glucose reference range applies only to samples taken after fasting for at least 8 hours.   BUN 11 6 - 20 mg/dL   Creatinine, Ser 9.03 0.61 - 1.24 mg/dL   Calcium 9.1 8.9 - 00.9 mg/dL   GFR, Estimated >23 >30 mL/min    Comment: (NOTE) Calculated using the CKD-EPI Creatinine Equation (2021)    Anion gap 9 5 - 15    Comment: Performed at [facility], [address], [place], [place] [address]  CBC     Status: None   Collection Time: 02/22/21  3:39 AM  Result Value Ref Range   WBC 7.7 4.0 - 10.5 K/uL   RBC 5.18 4.22 - 5.81 MIL/uL   Hemoglobin 16.0 13.0 - 17.0 g/dL   HCT 63.3 35.4 - 56.2 %   MCV 87.6 80.0 - 100.0 fL   MCH 30.9 26.0 - 34.0 pg   MCHC 35.2 30.0 - 36.0 g/dL   RDW 56.3 89.3 - 73.4 %   Platelets 166 150 - 400 K/uL   nRBC 0.0 0.0 - 0.2 %    Comment: Performed at [facility], [address] [place], [place] [address]   ECHO TEE  Result Date: 02/21/2021    TRANSESOPHOGEAL ECHO REPORT   Patient Name:   VANN OKERLUND Date of Exam: 02/21/2021 Medical Rec #:  [id number]    Height:       68.0 in Accession #:    [id number]   Weight:       230.0 lb Date of Birth:  1985-06-15    BSA:          2.169 m Patient Age:    35 years     BP:           116/90 mmHg Patient Gender: M            HR:           80 bpm. Exam Location:  Inpatient Procedure: Transesophageal Echo, Cardiac Doppler, Color Doppler and 3D Echo Indications:     CVA  History:         Patient has prior history of Echocardiogram examinations, most                   recent 02/15/2021. Risk Factors:Hypertension and Dyslipidemia.  Polysubstance abuse.  Sonographer:     Leta Jungling RDCS Referring Phys:  [id number] Corrin Parker Diagnosing Phys: Chilton Si MD PROCEDURE: After discussion of the risks and benefits of a TEE, an informed consent was obtained from the patient. TEE procedure time was 15 minutes. The transesophogeal probe was passed without difficulty through the esophogus of the patient. Imaged were obtained with the patient in a left lateral decubitus position. Sedation performed by different physician. The patient was monitored while under deep sedation. Anesthestetic sedation was provided intravenously by Anesthesiology: 332.04mg  of Propofol. Image quality was good. The patient's vital signs; including heart rate, blood pressure, and oxygen saturation; remained stable throughout the procedure. The patient developed no complications during the procedure. IMPRESSIONS  1. Left ventricular ejection fraction, by estimation, is 60 to 65%. The left ventricle has normal function. The left ventricle has no regional wall motion abnormalities. Left ventricular diastolic function could not be evaluated.  2. Right ventricular systolic function is normal. The right ventricular size is normal.  3. No left atrial/left atrial appendage thrombus was detected.  4. The mitral valve is normal in structure. Trivial mitral valve regurgitation. No evidence of mitral stenosis.  5. The aortic valve is normal in structure. Aortic valve regurgitation is not visualized. No aortic stenosis is present.  6. Evidence of atrial level shunting detected by color flow Doppler. Agitated saline contrast bubble study was positive with shunting observed within 3-6 cardiac cycles suggestive of interatrial shunt. There is a small patent foramen ovale with predominantly right to left shunting across the atrial septum. Conclusion(s)/Recommendation(s): Normal biventricular function  without evidence of hemodynamically significant valvular heart disease. Findings are concerning for an interatrial shunt as detailed above. FINDINGS  Left Ventricle: Left ventricular ejection fraction, by estimation, is 60 to 65%. The left ventricle has normal function. The left ventricle has no regional wall motion abnormalities. The left ventricular internal cavity size was normal in size. There is  no left ventricular hypertrophy. Left ventricular diastolic function could not be evaluated. Right Ventricle: The right ventricular size is normal. No increase in right ventricular wall thickness. Right ventricular systolic function is normal. Left Atrium: Left atrial size was normal in size. No left atrial/left atrial appendage thrombus was detected. Right Atrium: Right atrial size was normal in size. Pericardium: There is no evidence of pericardial effusion. Mitral Valve: The mitral valve is normal in structure. Trivial mitral valve regurgitation. No evidence of mitral valve stenosis. Tricuspid Valve: The tricuspid valve is normal in structure. Tricuspid valve regurgitation is trivial. No evidence of tricuspid stenosis. Aortic Valve: The aortic valve is normal in structure. Aortic valve regurgitation is not visualized. No aortic stenosis is present. Pulmonic Valve: The pulmonic valve was normal in structure. Pulmonic valve regurgitation is not visualized. No evidence of pulmonic stenosis. Aorta: The aortic root is normal in size and structure. IAS/Shunts: Evidence of atrial level shunting detected by color flow Doppler. Agitated saline contrast was given intravenously to evaluate for intracardiac shunting. Agitated saline contrast bubble study was positive with shunting observed within 3-6 cardiac cycles suggestive of interatrial shunt. A small patent foramen ovale is detected with predominantly right to left shunting across the atrial septum. There is no evidence of an atrial septal defect.  TRICUSPID VALVE TR Peak  grad:   20.4 mmHg TR Vmax:        226.00 cm/s Chilton Si MD Electronically signed by Chilton Si MD Signature Date/Time: 02/21/2021/7:17:35 PM    Final  Medical Problem List and Plan: 1.  Left-sided weakness secondary to right PCA infarction secondary to terminal right ICA occlusion status post failed mechanical thrombectomy etiology likely secondary to cocaine use/vasospasm  -patient may  shower  -ELOS/Goals: 24-28 days with min assist to supervision goals 2.  Antithrombotics: -DVT/anticoagulation: Lower extremity Dopplers negative Pharmaceutical: Lovenox  -antiplatelet therapy: Aspirin 81 mg daily and Plavix 75 mg daily x90 days then aspirin alone 3. Pain Management: Tylenol as needed 4. Mood: Provide emotional support  -antipsychotic agents: N/A 5. Neuropsych: This patient is capable of making decisions on his own behalf. 6. Skin/Wound Care: Routine skin checks. Encourage appropriate nutrition 7. Fluids/Electrolytes/Nutrition: Routine in and outs with follow-up chemistries 8.  Polysubstance abuse.  Provide counseling 9.  Hyperlipidemia.  Lipitor  10.  Obesity.  BMI 34.97.  Dietary follow-up 11. Dysphagia: showing improvement. Has been advanced to regular diet but still dysarthric and obvious oro-motor control issues. Monitor for now  -make sure patient is up and at 90 degrees to eat  -small bites, take extra time, etc    Charlton Amor, PA-C 02/22/2021

## 2021-02-22 NOTE — Progress Notes (Signed)
PMR Admission Coordinator Pre-Admission Assessment   Patient: David Craig is an 35 y.o., male MRN: [id number] DOB: October 01, 1985 Height: _0  (172.7 cm) Weight: 104.3 kg   Insurance Information HMO:     PPO: yes     PCP:      IPA:      80/20:      OTHER:  PRIMARY: [employer] of       Policy#: [id number]      Subscriber: pt CM Name: faxed approval      Phone#: [phone]     Fax#: [phone] Pre-Cert#: [id number] auth for CIR via fax with updates due to fax listed above on 10/4.       Employer:  Benefits:  Phone #:     Name:  Eff. Date: 01/17/2021  (HR working to retro this eff date to 11/17/20)   Deduct: $3000 ($0 met)      Out of Pocket Max: $7000 ($0 met)      Life Max: n/a CIR: 70%      SNF: 70% Outpatient: 70%     Co-Ins: 30% Home Health: 70%      Co-Ins: 30% DME: 70%     Co-Ins: 30% Providers:  SECONDARY:       Policy#:      Phone#:    Development worker, community:       Phone#:    The "Data Collection Information Summary" for patients in Inpatient Rehabilitation Facilities with attached "Privacy Act [place] Records" was provided and verbally reviewed with: N/A   Emergency Contact Information Contact Information       Name Relation Home Work Mobile    [place] Spouse     [place] T Other [phone]             Current Medical History  Patient Admitting Diagnosis: CVA    History of Present Illness: David Craig is a 35 y/o right handed male with history of polysubstance abuse/tobacco, migraine headaches and obesity with BMI 34.97.  Presented 02/14/2021 with left-sided weakness of acute onset as well as headache.  Cranial CT scan negative.  CT angiogram head and neck showed suspected dissection of the distal right internal carotid artery beginning at the distal cavernous segment, which became occluded proximal to the carotid terminus.  The right middle and anterior cerebral arteries fill via flow across the anterior communicating  artery.  Large area of ischemic penumbra within the right hemisphere in a watershed/hypoperfusion type distribution.  Patient did not receive tPA.  Admission chemistries unremarkable except glucose 131, WBC 13,900, urine drug screen positive cocaine, as well as marijuana.  Attempts of mechanical thrombectomy for right ICA occlusion failed per interventional radiology.  MRI showed multifocal ischemic infarcts of right PCA territory involving occipital, medial temporal, posterior limb of internal capsule, and ventrolateral thalamic regions.  Echocardiogram with ejection fraction of 65 to 70%, no wall motion abnormality.  Lower extremity dopplers negative.  Transcranial doppler study revealed grade 1 PFO.  TEE completed 02/21/2021 showing no thrombus or mass small PFO.  Currently maintained on aspirin 81 mg daily and Plavix 75 mg daily x90 days, then aspirin alone.  Subcutaneous Lovenox for DVT prophylaxis.  His diet has been advanced to regular consistency.  Therapy evaluations completed due to patient's left-sided weakness was recommended for a comprehensive rehab program.   Complete NIHSS TOTAL: 11   Patient's medical record from [facility] has been reviewed by the rehabilitation admission coordinator and physician.   Past Medical History  History reviewed. No pertinent past medical history.   Has the patient had major surgery during 100 days prior to admission? Yes   Family History   family history is not on file.   Current Medications   Current Facility-Administered Medications:    0.9 %  sodium chloride infusion, 250 mL, Intravenous, Continuous, Bailey-Modzik, Delila A, NP, Last Rate: 10 mL/hr at 02/16/21 0523, Rate Change at 02/16/21 0523   acetaminophen (TYLENOL) tablet 650 mg, 650 mg, Oral, Q4H PRN **OR** acetaminophen (TYLENOL) 160 MG/5ML solution 650 mg, 650 mg, Per Tube, Q4H PRN **OR** acetaminophen (TYLENOL) suppository 650 mg, 650 mg, Rectal, Q4H PRN, Bailey-Modzik, Delila A, NP    aspirin EC tablet 325 mg, 325 mg, Oral, Daily, Rosalin Hawking, MD, 325 mg at 02/21/21 1043   atorvastatin (LIPITOR) tablet 80 mg, 80 mg, Oral, q1800, Bailey-Modzik, Delila A, NP, 80 mg at 02/21/21 1721   Chlorhexidine Gluconate Cloth 2 % PADS 6 each, 6 each, Topical, Daily, Bailey-Modzik, Delila A, NP, 6 each at 02/20/21 0935   clopidogrel (PLAVIX) tablet 75 mg, 75 mg, Oral, Daily, Bailey-Modzik, Delila A, NP, 75 mg at 02/21/21 1042   docusate sodium (COLACE) capsule 100 mg, 100 mg, Oral, BID, Rosalin Hawking, MD, 100 mg at 02/20/21 0935   enoxaparin (LOVENOX) injection 40 mg, 40 mg, Subcutaneous, Q24H, Bailey-Modzik, Delila A, NP, 40 mg at 02/21/21 1721   MEDLINE mouth rinse, 15 mL, Mouth Rinse, BID, Bailey-Modzik, Delila A, NP, 15 mL at 02/20/21 0936   ondansetron (ZOFRAN) injection 4 mg, 4 mg, Intravenous, Q6H PRN, Bailey-Modzik, Delila A, NP, 4 mg at 02/15/21 0915   pantoprazole (PROTONIX) EC tablet 40 mg, 40 mg, Oral, Daily, Rosalin Hawking, MD, 40 mg at 02/21/21 1043   polyethylene glycol (MIRALAX / GLYCOLAX) packet 17 g, 17 g, Oral, Daily, Leonie Man, Pramod S, MD, 17 g at 02/20/21 0936   senna-docusate (Senokot-S) tablet 1 tablet, 1 tablet, Oral, QHS PRN, Bailey-Modzik, Delila A, NP   Patients Current Diet:  Diet Order                  Diet regular Room service appropriate? Yes; Fluid consistency: Thin  Diet effective now                         Precautions / Restrictions Precautions Precautions: Fall Precaution Comments: L hemi, L homonymous hemianopsia Cervical Brace: Hard collar (discontinued by MD at end of therapy eval) Restrictions Weight Bearing Restrictions: No    Has the patient had 2 or more falls or a fall with injury in the past year? No   Prior Activity Level Community (5-7x/wk): independent prior to admit, working in [employer], driving, no DME, 2 small children and spouse at home   Prior Functional Level Self Care: Did the patient need help bathing, dressing, using the  toilet or eating? Independent   Indoor Mobility: Did the patient need assistance with walking from room to room (with or without device)? Independent   Stairs: Did the patient need assistance with internal or external stairs (with or without device)? Independent   Functional Cognition: Did the patient need help planning regular tasks such as shopping or remembering to take medications? Independent   Patient Information Are you of Hispanic, Latino/a,or Spanish origin?: X. Patient unable to respond What is your race?: X. Patient unable to respond Do you need or want an interpreter to communicate with a doctor or health care staff?: 9. Unable to respond  Patient's Response To:  Health Literacy and Transportation Is the patient able to respond to health literacy and transportation needs?: No   Home Assistive Devices / Equipment Home Equipment: Hand held shower head   Prior Device Use: Indicate devices/aids used by the patient prior to current illness, exacerbation or injury? None of the above   Current Functional Level Cognition   Arousal/Alertness: Awake/alert (adequately awake) Overall Cognitive Status: Impaired/Different from baseline Current Attention Level: Sustained Orientation Level: Oriented X4 Following Commands: Follows one step commands consistently Safety/Judgement: Decreased awareness of deficits General Comments: Pt requires vc's to attend to L side, and safely move L extremeties. Pt was inappropriately impulsive, and needed re-direction to tasks. pt completed clock draw and letter cancelation with about 10% accuracy Attention: Sustained Sustained Attention: Impaired Sustained Attention Impairment: Verbal basic Memory: Impaired Memory Impairment: Storage deficit, Retrieval deficit Awareness: Impaired Awareness Impairment: Anticipatory impairment, Emergent impairment Problem Solving:  (will assess higher level) Behaviors: Impulsive Safety/Judgment: Other (comment)  (suspect impaired)    Extremity Assessment (includes Sensation/Coordination)   Upper Extremity Assessment: LUE deficits/detail LUE Deficits / Details: Little AROM.  PROM WFL.  Shoulder strength 1/5 LUE Sensation: decreased light touch, decreased proprioception (Pt attempting to roll onto LUE and needed auditory cues to attend to and dependently move LUE) LUE Coordination: decreased fine motor, decreased gross motor (limited to 0 activiation throughout LUE, some movement with shoulder shrug)  Lower Extremity Assessment: Defer to PT evaluation LLE Deficits / Details: 0/5 LLE Sensation: WNL LLE Coordination: decreased fine motor, decreased gross motor     ADLs   Overall ADL's : Needs assistance/impaired Eating/Feeding: Minimal assistance, Cueing for compensatory techinques, Sitting Eating/Feeding Details (indicate cue type and reason): assist to cut and set up tray.  Cues to find items on L of tray and not pocket on the left. Grooming: Wash/dry hands, Oral care, Moderate assistance, Standing, Cueing for compensatory techniques Grooming Details (indicate cue type and reason): educated family on how to assist pt with grooming tasks to functionally work on his deficits: place grooming supplies on empty table (eliminate distractions), place supplies at midline and eventually work towards the L to force pt to scan into his L fields Upper Body Bathing: Moderate assistance Upper Body Bathing Details (indicate cue type and reason): supported sitting Lower Body Bathing: Total assistance Lower Body Bathing Details (indicate cue type and reason): Mod A+2 sit<>stand Upper Body Dressing : Maximal assistance, Sitting Upper Body Dressing Details (indicate cue type and reason): Pt impulsive during task and forgetting LUE. Required max vc to place LUE into gown first, and then proceed with R arm. Lower Body Dressing: Total assistance Lower Body Dressing Details (indicate cue type and reason): pt donned R sock  with mod assist for balance and L sock with mod assist x2 to remain sitting and don sock. Toilet Transfer: Moderate assistance, Squat-pivot, [facility] Toilet Transfer Details (indicate cue type and reason): simulated from bed>recliner. vc for hand placement, mod physical assit Toileting- Clothing Manipulation and Hygiene: Total assistance Toileting - Clothing Manipulation Details (indicate cue type and reason): pt soiled wtih urine upon arrival, unaware - incontinent Functional mobility during ADLs: Moderate assistance, +2 for physical assistance, +2 for safety/equipment, Cueing for safety General ADL Comments: pt requried heavy cueing throughout all tasks to attend to L extremeties, to sustain attention to task, to eleminate inappropriate behaviors and for implusivity. mod-max physical assist for mobility throughout     Mobility   Overal bed mobility: Needs Assistance Bed Mobility: Rolling, Sidelying to Sit  Rolling: Mod assist Sidelying to sit: Mod assist Supine to sit: Mod assist, HOB elevated General bed mobility comments: mod A for verbal cues to appropriate placement of LUE (pt dependently movign LUE with RUE), and mod A for physically assisting pt with BLE off of the bed and pushing into sitting     Transfers   Overall transfer level: Needs assistance Equipment used: Rolling walker (2 wheeled) Transfer via Lift Equipment: Stedy Transfers: Sit to/from Stand, [employer] Sit to Stand: Mod assist (+1) Stand pivot transfers: Mod assist (+1)  Lateral/Scoot Transfers: Min assist, +2 physical assistance, +2 safety/equipment General transfer comment: Pt completed 10x sit<>stand with +1 assist ranging from minA-Mod A for L knee blocking and phsyical lifting. he required mod A for squat pivot +1 for verbal cues of hand placement and phsyical assist of L side.     Ambulation / Gait / Stairs / Wheelchair Mobility   Ambulation/Gait Ambulation/Gait assistance: Max assist, +2 physical  assistance Gait Distance (Feet): 2 Feet Assistive device: 4-wheeled walker Gait Pattern/deviations: Step-to pattern, Decreased stance time - left, Decreased step length - left, Decreased dorsiflexion - left General Gait Details: worked on sequencing steps, pt dependent of PT to hold L hand on walker, PT with tactile cues and modA to advance L LE, cues at knee to flex during swing phase and complete quad set in stand phase Gait velocity: slow Gait velocity interpretation: <1.8 ft/sec, indicate of risk for recurrent falls     Posture / Balance Dynamic Sitting Balance Sitting balance - Comments: Able to maintain static sitting with supervision but at times did lose balance when reaching to grab bedrails with RUE. Balance Overall balance assessment: Needs assistance Sitting-balance support: Feet supported Sitting balance-Leahy Scale: Fair Sitting balance - Comments: Able to maintain static sitting with supervision but at times did lose balance when reaching to grab bedrails with RUE. Postural control: Left lateral lean Standing balance support: Bilateral upper extremity supported, During functional activity Standing balance-Leahy Scale: Poor Standing balance comment: pt stood with +1 assist with blocking of L knee; weight shifting/stepping in standing required +2 assist     Special needs/care consideration N/a    Previous Home Environment (from acute therapy documentation) Living Arrangements: Spouse/significant other, Children (1 and 70 yo)  Lives With: Spouse Available Help at Discharge: Family, Available 24 hours/day Type of Home: House Home Layout: One level Home Access: Stairs to enter Entrance Stairs-Rails: Left Entrance Stairs-Number of Steps: 4 Bathroom Shower/Tub: Public librarian, Architectural technologist: Standard Additional Comments: 2 children - 79 and 84 years old   Discharge Living Setting Plans for Discharge Living Setting: Patient's home, Lives with (comment) (spouse,  [name], and 2 children) Type of Home at Discharge: House Discharge Home Layout: One level Discharge Home Access: Stairs to enter Entrance Stairs-Rails: Left Entrance Stairs-Number of Steps: 4 Discharge Bathroom Shower/Tub: Tub/shower unit Discharge Bathroom Toilet: Standard Discharge Bathroom Accessibility: No Does the patient have any problems obtaining your medications?: No   Social/Family/Support Systems Patient Roles: Spouse, Parent Contact Information: pt has a 31 y/o and a 37 y/o at home Anticipated Caregiver: Smokey Melott Anticipated Caregiver's Contact Information: [phone] Ability/Limitations of Caregiver: min assist Caregiver Availability: 24/7 Discharge Plan Discussed with Primary Caregiver: Yes Is Caregiver In Agreement with Plan?: Yes Does Caregiver/Family have Issues with Lodging/Transportation while Pt is in Rehab?: No   Goals Patient/Family Goal for Rehab: PT/OT min assist, SLP supervision to min assist Expected length of stay: 24-28 days Pt/Family Agrees to Admission and willing  to participate: Yes Program Orientation Provided & Reviewed with Pt/Caregiver Including Roles  & Responsibilities: Yes  Barriers to Discharge: Insurance for SNF coverage, Home environment access/layout   Decrease burden of Care through IP rehab admission: n/a   Possible need for SNF placement upon discharge: Not anticipated   Patient Condition: I have reviewed medical records from [facility], spoken with CM, and patient and spouse. I met with patient at the bedside for inpatient rehabilitation assessment.  Patient will benefit from ongoing PT, OT, and SLP, can actively participate in 3 hours of therapy a day 5 days of the week, and can make measurable gains during the admission.  Patient will also benefit from the coordinated team approach during an Inpatient Acute Rehabilitation admission.  The patient will receive intensive therapy as well as Rehabilitation physician, nursing, social  worker, and care management interventions.  Due to bladder management, bowel management, safety, skin/wound care, disease management, medication administration, pain management, and patient education the patient requires 24 hour a day rehabilitation nursing.  The patient is currently min to mod +2 with mobility and basic ADLs.  Discharge setting and therapy post discharge at home with home health is anticipated.  Patient has agreed to participate in the Acute Inpatient Rehabilitation Program and will admit today.   Preadmission Screen Completed By:  Michel Santee, PT, DPT 02/22/2021 11:36 AM ______________________________________________________________________   Discussed status with Dr. Naaman Plummer on 02/22/21  at 11:36 AM  and received approval for admission today.   Admission Coordinator:  Michel Santee, PT, DPT time 11:36 AM Sudie Grumbling 02/22/21     Assessment/Plan: Diagnosis:R PCA infarct d/t Right ICA dissection Does the need for close, 24 hr/day Medical supervision in concert with the patient's rehab needs make it unreasonable for this patient to be served in a less intensive setting? Yes Co-Morbidities requiring supervision/potential complications: PSA, migraines, obesity Due to bladder management, bowel management, safety, skin/wound care, disease management, medication administration, pain management, and patient education, does the patient require 24 hr/day rehab nursing? Yes Does the patient require coordinated care of a physician, rehab nurse, PT, OT, and SLP to address physical and functional deficits in the context of the above medical diagnosis(es)? Yes Addressing deficits in the following areas: balance, endurance, locomotion, strength, transferring, bowel/bladder control, bathing, dressing, feeding, grooming, toileting, cognition, speech, and psychosocial support Can the patient actively participate in an intensive therapy program of at least 3 hrs of therapy 5 days a week? Yes The  potential for patient to make measurable gains while on inpatient rehab is excellent Anticipated functional outcomes upon discharge from inpatient rehab: min assist PT, min assist OT, supervision and min assist SLP Estimated rehab length of stay to reach the above functional goals is: 24-28 days Anticipated discharge destination: Home 10. Overall Rehab/Functional Prognosis: excellent     MD Signature: Meredith Staggers, MD, [facility] 02/22/2021

## 2021-02-22 NOTE — Plan of Care (Signed)
  Problem: Education: Goal: Knowledge of disease or condition will improve Outcome: Progressing Goal: Knowledge of secondary prevention will improve Outcome: Progressing Goal: Knowledge of patient specific risk factors addressed and post discharge goals established will improve Outcome: Progressing Goal: Individualized Educational Video(s) Outcome: Progressing   Problem: Coping: Goal: Will verbalize positive feelings about self Outcome: Progressing Goal: Will identify appropriate support needs Outcome: Progressing   Problem: Self-Care: Goal: Ability to participate in self-care as condition permits will improve Outcome: Progressing   Problem: Nutrition: Goal: Risk of aspiration will decrease Outcome: Progressing Goal: Dietary intake will improve Outcome: Progressing   Problem: Ischemic Stroke/TIA Tissue Perfusion: Goal: Complications of ischemic stroke/TIA will be minimized Outcome: Progressing

## 2021-02-23 DIAGNOSIS — I63531 Cerebral infarction due to unspecified occlusion or stenosis of right posterior cerebral artery: Secondary | ICD-10-CM

## 2021-02-23 LAB — CBC WITH DIFFERENTIAL/PLATELET
Abs Immature Granulocytes: 0.06 10*3/uL (ref 0.00–0.07)
Basophils Absolute: 0 10*3/uL (ref 0.0–0.1)
Basophils Relative: 1 %
Eosinophils Absolute: 0.3 10*3/uL (ref 0.0–0.5)
Eosinophils Relative: 4 %
HCT: 46.1 % (ref 39.0–52.0)
Hemoglobin: 16.2 g/dL (ref 13.0–17.0)
Immature Granulocytes: 1 %
Lymphocytes Relative: 26 %
Lymphs Abs: 2 10*3/uL (ref 0.7–4.0)
MCH: 30.8 pg (ref 26.0–34.0)
MCHC: 35.1 g/dL (ref 30.0–36.0)
MCV: 87.6 fL (ref 80.0–100.0)
Monocytes Absolute: 0.7 10*3/uL (ref 0.1–1.0)
Monocytes Relative: 9 %
Neutro Abs: 4.5 10*3/uL (ref 1.7–7.7)
Neutrophils Relative %: 59 %
Platelets: 163 10*3/uL (ref 150–400)
RBC: 5.26 MIL/uL (ref 4.22–5.81)
RDW: 12.4 % (ref 11.5–15.5)
WBC: 7.6 10*3/uL (ref 4.0–10.5)
nRBC: 0 % (ref 0.0–0.2)

## 2021-02-23 LAB — COMPREHENSIVE METABOLIC PANEL
ALT: 58 U/L — ABNORMAL HIGH (ref 0–44)
AST: 26 U/L (ref 15–41)
Albumin: 3.4 g/dL — ABNORMAL LOW (ref 3.5–5.0)
Alkaline Phosphatase: 56 U/L (ref 38–126)
Anion gap: 7 (ref 5–15)
BUN: 13 mg/dL (ref 6–20)
CO2: 25 mmol/L (ref 22–32)
Calcium: 8.8 mg/dL — ABNORMAL LOW (ref 8.9–10.3)
Chloride: 105 mmol/L (ref 98–111)
Creatinine, Ser: 0.63 mg/dL (ref 0.61–1.24)
GFR, Estimated: >60 mL/min (ref >60)
Glucose, Bld: 108 mg/dL — ABNORMAL HIGH (ref 70–99)
Potassium: 3.6 mmol/L (ref 3.5–5.1)
Sodium: 137 mmol/L (ref 135–145)
Total Bilirubin: 0.9 mg/dL (ref 0.3–1.2)
Total Protein: 6.3 g/dL — ABNORMAL LOW (ref 6.5–8.1)

## 2021-02-23 MED ORDER — TRAZODONE HCL 50 MG PO TABS
50.0000 mg | ORAL_TABLET | Freq: Every day | ORAL | Status: DC
  Administered 2021-02-23 – 2021-02-26 (×4): 50 mg via ORAL
  Filled 2021-02-23 (×5): qty 1

## 2021-02-23 NOTE — Discharge Instructions (Addendum)
Inpatient Rehab Discharge Instructions  David Craig Discharge date and time: No discharge date for patient encounter.   Activities/Precautions/ Functional Status: Activity: activity as tolerated Diet: regular diet Wound Care: Routine skin checks Functional status:  ___ No restrictions     ___ Walk up steps independently ___ 24/7 supervision/assistance   ___ Walk up steps with assistance ___ Intermittent supervision/assistance  ___ Bathe/dress independently ___ Walk with walker     __x_ Bathe/dress with assistance ___ Walk Independently    ___ Shower independently ___ Walk with assistance    ___ Shower with assistance ___ No alcohol     ___ Return to work/school ________   COMMUNITY REFERRALS UPON DISCHARGE:    Outpatient: PT     OT    ST                 Agency:[facility]                         Address: [address] [place], [place], [place] [address]                       Phone:[phone]              Appointment Date/Time:*Please expect follow-up within 7-10 business days. If you have not received follow-up, be sure to follow-up with the site directly.*  Medical Equipment/Items Ordered: wheelchair                                                 Agency/Supplier: [facility] [phone]    Special Instructions: No driving smoking alcohol or illicit drug use  Continue aspirin 81 mg and Plavix 75 mgSTROKE/TIA DISCHARGE INSTRUCTIONS SMOKING Cigarette smoking nearly doubles your risk of having a stroke & is the single most alterable risk factor  If you smoke or have smoked in the last 12 months, you are advised to quit smoking for your health. Most of the excess cardiovascular risk related to smoking disappears within a year of stopping. Ask you doctor about anti-smoking medications  Quit Line: 1-800-QUIT NOW Free Smoking Cessation Classes [phone]  CHOLESTEROL Know your levels; limit fat & cholesterol in your diet  Lipid Panel     Component Value Date/Time   CHOL  197 02/15/2021 0243   TRIG 190 (H) 02/15/2021 0243   HDL 33 (L) 02/15/2021 0243   CHOLHDL 6.0 02/15/2021 0243   VLDL 38 02/15/2021 0243   LDLCALC 126 (H) 02/15/2021 0243     Many patients benefit from treatment even if their cholesterol is at goal. Goal: Total Cholesterol (CHOL) less than 160 Goal:  Triglycerides (TRIG) less than 150 Goal:  HDL greater than 40 Goal:  LDL (LDLCALC) less than 100   BLOOD PRESSURE American Stroke Association blood pressure target is less that 120/80 mm/Hg  Your discharge blood pressure is:  BP: 114/75 Monitor your blood pressure Limit your salt and alcohol intake Many individuals will require more than one medication for high blood pressure  DIABETES (A1c is a blood sugar average for last 3 months) Goal HGBA1c is under 7% (HBGA1c is blood sugar average for last 3 months)  Diabetes: No known diagnosis of diabetes    Lab Results  Component Value Date   HGBA1C 5.0 02/15/2021    Your HGBA1c can be lowered with  medications, healthy diet, and exercise. Check your blood sugar as directed by your physician Call your physician if you experience unexplained or low blood sugars.  PHYSICAL ACTIVITY/REHABILITATION Goal is 30 minutes at least 4 days per week  Activity: Increase activity slowly, Therapies: Physical Therapy: Home Health Return to work:  Activity decreases your risk of heart attack and stroke and makes your heart stronger.  It helps control your weight and blood pressure; helps you relax and can improve your mood. Participate in a regular exercise program. Talk with your doctor about the best form of exercise for you (dancing, walking, swimming, cycling).  DIET/WEIGHT Goal is to maintain a healthy weight  Your discharge diet is:  Diet Order             Diet regular Room service appropriate? Yes; Fluid consistency: Thin  Diet effective now                   liquids Your height is:  Height: 5\' 8"  (172.7 cm) Your current weight is: Weight:  98.7 kg Your Body Mass Index (BMI) is:  BMI (Calculated): 33.09 Following the type of diet specifically designed for you will help prevent another stroke. Your goal weight range is:   Your goal Body Mass Index (BMI) is 19-24. Healthy food habits can help reduce 3 risk factors for stroke:  High cholesterol, hypertension, and excess weight.  RESOURCES Stroke/Support Group:  Call [phone]   STROKE EDUCATION PROVIDED/REVIEWED AND GIVEN TO PATIENT Stroke warning signs and symptoms How to activate emergency medical system (call 911). Medications prescribed at discharge. Need for follow-up after discharge. Personal risk factors for stroke. Pneumonia vaccine given: No Flu vaccine given: No My questions have been answered, the writing is legible, and I understand these instructions.  I will adhere to these goals & educational materials that have been provided to me after my discharge from the hospital.     x3 months then aspirin alone   My questions have been answered and I understand these instructions. I will adhere to these goals and the provided educational materials after my discharge from the hospital.  Patient/Caregiver Signature _______________________________ Date __________  Clinician Signature _______________________________________ Date __________  Please bring this form and your medication list with you to all your follow-up doctor's appointments.

## 2021-02-23 NOTE — Evaluation (Signed)
Physical Therapy Assessment and Plan  Patient Details  Name: David Craig MRN: [id number] Date of Birth: 09/17/1985  PT Diagnosis: Abnormality of gait, Difficulty walking, and Hemiplegia non-dominant Rehab Potential: Good ELOS: 3.5-4 weeks   Today's Date: 02/23/2021 PT Individual Time: 1304-1400 PT Individual Time Calculation (min): 56 min    Hospital Problem: Principal Problem:   Cerebrovascular accident (CVA) due to occlusion of right posterior cerebral artery ([place])   Past Medical History: History reviewed. No pertinent past medical history. Past Surgical History:  Past Surgical History:  Procedure Laterality Date   BUBBLE STUDY  02/21/2021   Procedure: BUBBLE STUDY;  Surgeon: Skeet Latch, MD;  Location: [place];  Service: Cardiovascular;;   IR ANGIO INTRA EXTRACRAN SEL COM CAROTID INNOMINATE UNI L MOD SED  02/15/2021   IR ANGIO VERTEBRAL SEL VERTEBRAL BILAT MOD SED  02/15/2021   IR CT HEAD LTD  02/15/2021   IR PERCUTANEOUS ART THROMBECTOMY/INFUSION INTRACRANIAL INC DIAG ANGIO  02/15/2021   IR US GUIDE VASC ACCESS RIGHT  02/15/2021   RADIOLOGY WITH ANESTHESIA N/A 02/14/2021   Procedure: IR WITH ANESTHESIA;  Surgeon: Luanne Bras, MD;  Location: [facility];  Service: Radiology;  Laterality: N/A;   TEE WITHOUT CARDIOVERSION N/A 02/21/2021   Procedure: TRANSESOPHAGEAL ECHOCARDIOGRAM (TEE);  Surgeon: Skeet Latch, MD;  [facility];  Service: Cardiovascular;  Laterality: N/A;    Assessment & Plan Clinical Impression: Patient is a 35 y/o right handed male with history of polysubstance abuse/tobacco ,migrane headaches and obesity with BMI 34.97.  Per chart review patient lives with spouse and 2 young children.  Reportedly independent prior to admission.  Working in Press photographer for [employer].  Presented 02/14/2021 with left-sided weakness of acute onset as well as headache.  Cranial CT scan negative.  CT angiogram head and neck showed suspected dissection of the distal  right internal carotid artery beginning at the distal cavernous segment, which became occluded proximal to the carotid terminus.  The right middle and anterior cerebral arteries fill via flow across the anterior communicating artery.  Large area of ischemic penumbra within the right hemisphere in a watershed/hypoperfusion type distribution.  Patient did not receive tPA.  Admission chemistries unremarkable except glucose 131, WBC 13,900, urine drug screen positive cocaine as well as marijuana.  Attempts of mechanical thrombectomy for right ICA occlusion failed per interventional radiology.  MRI showed multifocal ischemic infarcts of right PCA territory involving occipital, medial temporal, posterior limb of internal capsule and ventrolateral thalamic regions.  Echocardiogram with ejection fraction of 65 to 70% no wall motion abnormality.  Lower extremity Dopplers negative.  Transcranial Doppler study revealed grade 1 PFO.  TEE completed 02/21/2021 showing no thrombus or mass small PFO.  Currently maintained on aspirin 81 mg daily and Plavix 75 mg daily x90 days then aspirin alone.  Subcutaneous Lovenox for DVT prophylaxis.  His diet has been advanced to regular consistency.   Patient transferred to CIR on 02/22/2021 .   Patient currently requires max with mobility secondary to muscle weakness, decreased cardiorespiratoy endurance, decreased motor planning, decreased attention to right, and decreased sitting balance, decreased standing balance, decreased postural control, hemiplegia, and decreased balance strategies.  Prior to hospitalization, patient was independent  with mobility and lived with Spouse, Family (2 young sons, 81 y/o and 61 y/o) in a House home.  Home access is 4Stairs to enter.  Patient will benefit from skilled PT intervention to maximize safe functional mobility, minimize fall risk, and decrease caregiver burden for planned discharge home with  24 hour supervision.  Anticipate patient will benefit  from follow up [place] at discharge.  PT - End of Session Activity Tolerance: Tolerates 30+ min activity with multiple rests Endurance Deficit: Yes Endurance Deficit Description: Rest breaks during functional mobility PT Assessment Rehab Potential (ACUTE/IP ONLY): Good PT Barriers to Discharge: Home environment access/layout PT Patient demonstrates impairments in the following area(s): Balance;Behavior;Endurance;Motor;Pain;Perception;Safety PT Transfers Functional Problem(s): Bed Mobility;Bed to Chair;Car;Furniture PT Locomotion Functional Problem(s): Ambulation;Stairs;Wheelchair Mobility PT Plan PT Intensity: Minimum of 1-2 x/day ,45 to 90 minutes PT Frequency: 5 out of 7 days PT Duration Estimated Length of Stay: 3.5-4 weeks PT Treatment/Interventions: Ambulation/gait training;Community reintegration;DME/adaptive equipment instruction;Neuromuscular re-education;Psychosocial support;Stair training;UE/LE Strength taining/ROM;Wheelchair propulsion/positioning;Balance/vestibular training;Discharge planning;Functional electrical stimulation;Pain management;Skin care/wound management;Therapeutic Activities;UE/LE Coordination activities;Cognitive remediation/compensation;Disease management/prevention;Patient/family education;Functional mobility training;Therapeutic Exercise;Splinting/orthotics;Visual/perceptual remediation/compensation PT Transfers Anticipated Outcome(s): CGA PT Locomotion Anticipated Outcome(s): CGA PT Recommendation Follow Up Recommendations: 24 hour supervision/assistance;Home health PT Patient destination: Home Equipment Recommended: To be determined   PT Evaluation Precautions/Restrictions Precautions Precautions: Fall Precaution Comments: L hemi, L homonymous hemianopsia, R gaze preference General Chart Reviewed: Yes Family/Caregiver Present: Yes  Pain Interference Pain Interference Pain Effect on Sleep: 0. Does not apply - I have not had any pain or hurting in the  past 5 days Pain Interference with Therapy Activities: 1. Rarely or not at all Pain Interference with Day-to-Day Activities: 1. Rarely or not at all Home Living/Prior [place] Available Help at Discharge: Family;Available 24 hours/day Type of Home: House Home Access: Stairs to enter [employer] of Steps: 4 Home Layout: One level Bathroom Shower/Tub: Chiropodist: Standard Bathroom Accessibility: No  Lives With: Spouse;Family (2 young sons, 43 y/o and 33 y/o) Prior Function Level of Independence: Independent with basic ADLs;Independent with transfers Driving: Yes Vision/Perception  Vision - History Ability to See in Adequate Light: 1 Impaired Vision - Assessment Alignment/Gaze Preference: Gaze right Perception Perception: Impaired Inattention/Neglect: Does not attend to left side of body;Does not attend to left visual field Praxis Praxis: Impaired Praxis Impairment Details: Perseveration;Motor planning  Cognition Overall Cognitive Status: Impaired/Different from baseline Arousal/Alertness: Awake/alert Year: 2022 Month: September Day of Week: Correct Attention: Sustained Sustained Attention: Impaired Sustained Attention Impairment: Verbal basic;Functional basic Memory: Impaired Memory Impairment: Storage deficit;Decreased recall of new information;Decreased short term memory Immediate Memory Recall: Sock;Blue;Bed Memory Recall Sock: Without Cue Memory Recall Blue: Without Cue Memory Recall Bed: Without Cue Awareness: Impaired Awareness Impairment: Intellectual impairment Problem Solving: Impaired Problem Solving Impairment: Functional basic Behaviors: Impulsive Safety/Judgment: Impaired Comments: Pt impulsive with movement while completing tasks EOB, decreased insights into deficits including his fall risk Sensation Coordination Gross Motor Movements are Fluid and Coordinated: No Fine Motor Movements are Fluid and  Coordinated: No Coordination and Movement Description: Affected by left hemiparesis Motor  Motor Motor: Hemiplegia;Abnormal tone;Abnormal postural alignment and control Motor - Skilled Clinical Observations: Lt sided hemiplegia  Trunk/Postural Assessment  Cervical Assessment Cervical Assessment: Exceptions to [facility] (mild forward head) Thoracic Assessment Thoracic Assessment: Exceptions to [facility] (rounded shoulders) Lumbar Assessment Lumbar Assessment: Exceptions to [facility] (posterior pelvic tilt) Postural Control Postural Control: Deficits on evaluation (decreased in sitting + standing)  Balance Balance Balance Assessed: Yes Dynamic Sitting Balance Dynamic Sitting - Balance Support: During functional activity;No upper extremity supported Dynamic Sitting - Level of Assistance: 3: Mod assist;2: Max assist (washing his Lt foot, foot pulled up close towards body) Dynamic Standing Balance Dynamic Standing - Balance Support: During functional activity Dynamic Standing - Level of Assistance: 3: Mod assist;2: Max assist Dynamic Standing -  Balance Activities: Lateral lean/weight shifting;Forward lean/weight shifting (completing pericare) Extremity Assessment  RUE Assessment RUE Assessment: Within Functional Limits Active Range of Motion (AROM) Comments: WNL LUE Assessment LUE Assessment: Exceptions to [facility] LUE Body System: Neuro Brunstrum levels for arm and hand: Hand;Arm Brunstrum level for arm: Stage II Synergy is developing Brunstrum level for hand: Stage I Flaccidity LUE Tone LUE Tone: Hypotonic RLE Assessment RLE Assessment: Within Functional Limits LLE Assessment Passive Range of Motion (PROM) Comments: WFL General Strength Comments: No voluntary activation noted  Care Tool Care Tool Bed Mobility Roll left and right activity   Roll left and right assist level: Moderate Assistance - Patient 50 - 74%    Sit to lying activity   Sit to lying assist level: Moderate Assistance - Patient  50 - 74%    Lying to sitting on side of bed activity   Lying to sitting on side of bed assist level: the ability to move from lying on the back to sitting on the side of the bed with no back support.: Moderate Assistance - Patient 50 - 74%     Care Tool Transfers Sit to stand transfer   Sit to stand assist level: Moderate Assistance - Patient 50 - 74%    Chair/bed transfer   Chair/bed transfer assist level: Moderate Assistance - Patient 50 - 74%     Toilet transfer   Assist Level: Moderate Assistance - Patient 50 - 74%    Car transfer   Car transfer assist level: Maximal Assistance - Patient 25 - 49%      Care Tool Locomotion Ambulation   Assist level: 2 helpers Assistive device:  (R hand rail) Max distance: 25'  Walk 10 feet activity   Assist level: 2 helpers Assistive device:  (R hand rail)   Walk 50 feet with 2 turns activity Walk 50 feet with 2 turns activity did not occur: Safety/medical concerns      Walk 150 feet activity Walk 150 feet activity did not occur: Safety/medical concerns      Walk 10 feet on uneven surfaces activity Walk 10 feet on uneven surfaces activity did not occur: Safety/medical concerns      Stairs Stair activity did not occur: Safety/medical concerns        Walk up/down 1 step activity Walk up/down 1 step or curb (drop down) activity did not occur: Safety/medical concerns     Walk up/down 4 steps activity did not occuR: Safety/medical concerns  Walk up/down 4 steps activity      Walk up/down 12 steps activity Walk up/down 12 steps activity did not occur: Safety/medical concerns      Pick up small objects from floor Pick up small object from the floor (from standing position) activity did not occur: Safety/medical concerns      Wheelchair Is the patient using a wheelchair?: Yes Type of Wheelchair: Manual   Wheelchair assist level: Dependent - Patient 0% Max wheelchair distance: 150'  Wheel 50 feet with 2 turns activity   Assist  Level: Dependent - Patient 0%  Wheel 150 feet activity   Assist Level: Dependent - Patient 0%    Refer to Care Plan for Long Term Goals  SHORT TERM GOAL WEEK 1 PT Short Term Goal 1 (Week 1): Pt will perform bed mobility consistently with minA. PT Short Term Goal 2 (Week 1): Pt will perform sit to stand transfer consistently with minA. PT Short Term Goal 3 (Week 1): Pt will perform bed to chair transfer consistently with  minA. PT Short Term Goal 4 (Week 1): Pt will ambulate x50' with modA +1 and LRAD.  Recommendations for other services: None   Skilled Therapeutic Intervention  Evaluation completed (see details above and below) with education on PT POC and goals and individual treatment initiated with focus on bed mobility, balance, transfers, NMR of L hemibody, and ambulation. Pt received supine in bed and agrees to therapy. No complaint of pain. Supine to sit with modA and cues for sequencing and positioning at EOB. Pt performs sit to stand and stand pivot transfer to [facility] with modA and blocking of L knee, with cues for body mechanics, sequencing, and hand placement. WC transport to gym for time management. Pt performs stand pivot transfer to/from car with maxA and blocking of L knee. Pt performs sit to stand with modA so that PT can place seat cushion in WC. Pt then performs squat pivot to mat table to the L with maxA. From edge of mat, pt utilizes mirror to obtain neutral posture and gaze as pt has tendency for R gaze and R lateral weight shift. Pt able to position self close to midline and maintain forward gaze, then performs sit to stand with modA and multimodal cues for weight transition and body mechanics. In standing, pt performs forward and R lateral toe taps with R foot to encourage lateral weight shift to the L, with PT providing manual assistance for weight shift and full blocking of L knee, which does buckle several times during session. Pt then ambulates x25' with R handrail and  modA/maxA from PT to manage and block L knee, and +2 WC follow. Verbal cues for reciprocal gait pattern and midline positioning of trunk and shoulders. Squat pivot back to bed with maxA. Sit to supine with modA. Pt left supine with alarm intact and all needs within reach.   Mobility Bed Mobility Bed Mobility: Supine to Sit;Sit to Supine Supine to Sit: Moderate Assistance - Patient 50-74% Sit to Supine: Moderate Assistance - Patient 50-74% Transfers Transfers: Sit to Stand;Stand to Sit;Stand Pivot Transfers;Squat Pivot Transfers Sit to Stand: Moderate Assistance - Patient 50-74% Stand to Sit: Moderate Assistance - Patient 50-74% Stand Pivot Transfers: Moderate Assistance - Patient 50 - 74% Stand Pivot Transfer Details: Tactile cues for initiation;Tactile cues for posture;Verbal cues for sequencing;Tactile cues for placement;Tactile cues for sequencing;Manual facilitation for weight shifting Squat Pivot Transfers: Maximal Assistance - Patient 25-49% Transfer (Assistive device): 1 person hand held assist Locomotion  Gait Ambulation: Yes Gait Assistance: 2 Helpers Gait Distance (Feet): 25 Feet Assistive device: Other (Comment) (R hand rail) Gait Assistance Details: Tactile cues for initiation;Tactile cues for posture;Verbal cues for sequencing;Verbal cues for gait pattern;Verbal cues for technique;Manual facilitation for weight shifting Gait Gait: Yes Gait Pattern: Impaired Gait Pattern:  (L hemi gait) Gait velocity: Decreased Stairs / Additional Locomotion Stairs: No Wheelchair Mobility Wheelchair Mobility: Yes Wheelchair Assistance: Dependent - Patient 0% Distance: 150'   Discharge Criteria: Patient will be discharged from PT if patient refuses treatment 3 consecutive times without medical reason, if treatment goals not met, if there is a change in medical status, if patient makes no progress towards goals or if patient is discharged from hospital.  The above assessment, treatment  plan, treatment alternatives and goals were discussed and mutually agreed upon: by patient  Breck Coons, PT, DPT 02/23/2021, 3:39 PM

## 2021-02-23 NOTE — Progress Notes (Addendum)
PROGRESS NOTE   Subjective/Complaints: Had a pretty good night. Slept with trazodone. Up with OT early this morning  ROS: Patient denies fever, rash, sore throat,  nausea, vomiting, diarrhea, cough, shortness of breath or chest pain, joint or back pain, headache, or mood change.    Objective:   No results found. Recent Labs    02/22/21 0339 02/23/21 0602  WBC 7.7 7.6  HGB 16.0 16.2  HCT 45.4 46.1  PLT 166 163   Recent Labs    02/21/21 0707 02/23/21 0602  NA 139 137  K 4.2 3.6  CL 105 105  CO2 25 25  GLUCOSE 107* 108*  BUN 11 13  CREATININE 0.61 0.63  CALCIUM 9.1 8.8*    Intake/Output Summary (Last 24 hours) at 02/23/2021 1110 Last data filed at 02/23/2021 0900 Gross per 24 hour  Intake 240 ml  Output 321 ml  Net -81 ml        Physical Exam: Vital Signs Blood pressure 114/75, pulse (!) 58, temperature 97.8 F (36.6 C), temperature source Oral, resp. rate 20, height 5\' 8"  (1.727 m), weight 98.7 kg, SpO2 96 %.  General: Alert and oriented x 3, No apparent distress HEENT: Head is normocephalic, atraumatic, PERRLA, EOMI, sclera anicteric, oral mucosa pink and moist, dentition intact, ext ear canals clear,  Neck: Supple without JVD or lymphadenopathy Heart: Reg rate and rhythm. No murmurs rubs or gallops Chest: CTA bilaterally without wheezes, rales, or rhonchi; no distress Abdomen: Soft, non-tender, non-distended, bowel sounds positive. Extremities: No clubbing, cyanosis, or edema. Pulses are 2+ Psych: Pt's affect is appropriate. Pt is cooperative Skin: Clean and intact without signs of breakdown Neuro:  oriented to person, place, date and reason. Left central 7 and left HH, vision blurred, decreased lateral tracking. LUE with 1/5 delts biceps, 0/5 distally. LLE 1+ HF, KE and 0/5 distally. Senses pain on left. RUE and RLE 4-5/5.  Musculoskeletal: Full ROM, No pain with AROM or PROM in the neck, trunk, or  extremities.      Assessment/Plan: 1. Functional deficits which require 3+ hours per day of interdisciplinary therapy in a comprehensive inpatient rehab setting. Physiatrist is providing close team supervision and 24 hour management of active medical problems listed below. Physiatrist and rehab team continue to assess barriers to discharge/monitor patient progress toward functional and medical goals  Care Tool:  Bathing              Bathing assist       Upper Body Dressing/Undressing Upper body dressing   What is the patient wearing?: Hospital gown only    Upper body assist Assist Level: Maximal Assistance - Patient 25 - 49%    Lower Body Dressing/Undressing Lower body dressing            Lower body assist       Toileting Toileting    Toileting assist Assist for toileting: Total Assistance - Patient < 25%     Transfers Chair/bed transfer  Transfers assist     Chair/bed transfer assist level: Moderate Assistance - Patient 50 - 74%     Locomotion Ambulation   Ambulation assist  Walk 10 feet activity   Assist           Walk 50 feet activity   Assist           Walk 150 feet activity   Assist           Walk 10 feet on uneven surface  activity   Assist           Wheelchair     Assist               Wheelchair 50 feet with 2 turns activity    Assist            Wheelchair 150 feet activity     Assist          Blood pressure 114/75, pulse (!) 58, temperature 97.8 F (36.6 C), temperature source Oral, resp. rate 20, height 5\' 8"  (1.727 m), weight 98.7 kg, SpO2 96 %.  Medical Problem List and Plan: 1.  Left-sided weakness secondary to right PCA infarction secondary to terminal right ICA occlusion status post failed mechanical thrombectomy etiology likely secondary to cocaine use/vasospasm             -patient may  shower             -ELOS/Goals: 24-28 days with min assist to  supervision goals  --Patient is beginning CIR therapies today including PT, OT, and SLP   -grounds pass ordered 2.  Antithrombotics: -DVT/anticoagulation: Lower extremity Dopplers negative Pharmaceutical: Lovenox             -antiplatelet therapy: Aspirin 81 mg daily and Plavix 75 mg daily x90 days then aspirin alone 3. Pain Management: Tylenol as needed  -denies pain today 4. Mood/sleep: Provide emotional support  -will schedule trazodone at HS             -antipsychotic agents: N/A 5. Neuropsych: This patient is capable of making decisions on his own behalf. 6. Skin/Wound Care: Routine skin checks. Encourage appropriate nutrition 7. Fluids/Electrolytes/Nutrition: encourage PO  -I personally reviewed the patient's labs today.   8.  Polysubstance abuse.  Provide counseling 9.  Hyperlipidemia.  Lipitor  10.  Obesity.  BMI 34.97.  Dietary follow-up 11. Dysphagia: showing improvement. Has been advanced to regular diet but still dysarthric and obvious oro-motor control issues. Monitor for now             -make sure patient is up and at 90 degrees to eat             -small bites, take extra time, etc    LOS: 1 days A FACE TO FACE EVALUATION WAS PERFORMED  02/23/2021, 11:10 AM

## 2021-02-23 NOTE — Plan of Care (Signed)
  Problem: RH Balance Goal: LTG: Patient will maintain dynamic sitting balance (OT) Description: LTG:  Patient will maintain dynamic sitting balance with assistance during activities of daily living (OT) Flowsheets (Taken 02/23/2021 1236) LTG: Pt will maintain dynamic sitting balance during ADLs with: Supervision/Verbal cueing Goal: LTG Patient will maintain dynamic standing with ADLs (OT) Description: LTG:  Patient will maintain dynamic standing balance with assist during activities of daily living (OT)  Flowsheets (Taken 02/23/2021 1236) LTG: Pt will maintain dynamic standing balance during ADLs with: Supervision/Verbal cueing   Problem: Sit to Stand Goal: LTG:  Patient will perform sit to stand in prep for activites of daily living with assistance level (OT) Description: LTG:  Patient will perform sit to stand in prep for activites of daily living with assistance level (OT) Flowsheets (Taken 02/23/2021 1236) LTG: PT will perform sit to stand in prep for activites of daily living with assistance level: Supervision/Verbal cueing   Problem: RH Eating Goal: LTG Patient will perform eating w/assist, cues/equip (OT) Description: LTG: Patient will perform eating with assist, with/without cues using equipment (OT) Flowsheets (Taken 02/23/2021 1236) LTG: Pt will perform eating with assistance level of: Supervision/Verbal cueing   Problem: RH Grooming Goal: LTG Patient will perform grooming w/assist,cues/equip (OT) Description: LTG: Patient will perform grooming with assist, with/without cues using equipment (OT) Flowsheets (Taken 02/23/2021 1236) LTG: Pt will perform grooming with assistance level of: Supervision/Verbal cueing   Problem: RH Bathing Goal: LTG Patient will bathe all body parts with assist levels (OT) Description: LTG: Patient will bathe all body parts with assist levels (OT) Flowsheets (Taken 02/23/2021 1236) LTG: Pt will perform bathing with assistance level/cueing:  Supervision/Verbal cueing   Problem: RH Dressing Goal: LTG Patient will perform upper body dressing (OT) Description: LTG Patient will perform upper body dressing with assist, with/without cues (OT). Flowsheets (Taken 02/23/2021 1236) LTG: Pt will perform upper body dressing with assistance level of: Supervision/Verbal cueing Goal: LTG Patient will perform lower body dressing w/assist (OT) Description: LTG: Patient will perform lower body dressing with assist, with/without cues in positioning using equipment (OT) Flowsheets (Taken 02/23/2021 1236) LTG: Pt will perform lower body dressing with assistance level of: Minimal Assistance - Patient > 75%   Problem: RH Toileting Goal: LTG Patient will perform toileting task (3/3 steps) with assistance level (OT) Description: LTG: Patient will perform toileting task (3/3 steps) with assistance level (OT)  Flowsheets (Taken 02/23/2021 1236) LTG: Pt will perform toileting task (3/3 steps) with assistance level: Minimal Assistance - Patient > 75%   Problem: RH Toilet Transfers Goal: LTG Patient will perform toilet transfers w/assist (OT) Description: LTG: Patient will perform toilet transfers with assist, with/without cues using equipment (OT) Flowsheets (Taken 02/23/2021 1236) LTG: Pt will perform toilet transfers with assistance level of: Supervision/Verbal cueing   Problem: RH Tub/Shower Transfers Goal: LTG Patient will perform tub/shower transfers w/assist (OT) Description: LTG: Patient will perform tub/shower transfers with assist, with/without cues using equipment (OT) Flowsheets (Taken 02/23/2021 1236) LTG: Pt will perform tub/shower stall transfers with assistance level of: Contact Guard/Touching assist

## 2021-02-23 NOTE — Evaluation (Signed)
Speech Language Pathology Assessment and Plan  Patient Details  Name: David Craig MRN: [id number] Date of Birth: 04-Sep-1985  SLP Diagnosis: Dysarthria;Cognitive Impairments;Dysphagia  Rehab Potential: Excellent ELOS: 4 weeks    Today's Date: 02/23/2021 SLP Individual Time: 0930-1030 SLP Individual Time Calculation (min): 60 min   Hospital Problem: Principal Problem:   Cerebrovascular accident (CVA) due to occlusion of right posterior cerebral artery ([place])  Past Medical History: History reviewed. No pertinent past medical history. Past Surgical History:  Past Surgical History:  Procedure Laterality Date   BUBBLE STUDY  02/21/2021   Procedure: BUBBLE STUDY;  Surgeon: Skeet Latch, MD;  Location: [place];  Service: Cardiovascular;;   IR ANGIO INTRA EXTRACRAN SEL COM CAROTID INNOMINATE UNI L MOD SED  02/15/2021   IR ANGIO VERTEBRAL SEL VERTEBRAL BILAT MOD SED  02/15/2021   IR CT HEAD LTD  02/15/2021   IR PERCUTANEOUS ART THROMBECTOMY/INFUSION INTRACRANIAL INC DIAG ANGIO  02/15/2021   IR US GUIDE VASC ACCESS RIGHT  02/15/2021   RADIOLOGY WITH ANESTHESIA N/A 02/14/2021   Procedure: IR WITH ANESTHESIA;  Surgeon: Luanne Bras, MD;  Location: [place];  Service: Radiology;  Laterality: N/A;   TEE WITHOUT CARDIOVERSION N/A 02/21/2021   Procedure: TRANSESOPHAGEAL ECHOCARDIOGRAM (TEE);  Surgeon: Skeet Latch, MD;  Location: [facility];  Service: Cardiovascular;  Laterality: N/A;    Assessment / Plan / Recommendation Clinical Impression Patient is a 35 y/o right handed male with history of polysubstance abuse/tobacco ,migrane headaches and obesity. Presented 02/14/2021 with left-sided weakness of acute onset as well as headache.  Cranial CT scan negative.  CT angiogram head and neck showed suspected dissection of the distal right internal carotid artery beginning at the distal cavernous segment, which became occluded proximal to the carotid terminus.  The right middle and anterior  cerebral arteries fill via flow across the anterior communicating artery.  Large area of ischemic penumbra within the right hemisphere in a watershed/hypoperfusion type distribution.   Admission chemistries unremarkable except glucose 131, WBC 13,900, urine drug screen positive cocaine as well as marijuana.  Attempts of mechanical thrombectomy for right ICA occlusion failed per interventional radiology.  MRI showed multifocal ischemic infarcts of right PCA territory involving occipital, medial temporal, posterior limb of internal capsule and ventrolateral thalamic regions. Transcranial Doppler study revealed grade 1 PFO.  Therapy evaluations completed due to patient's left-sided weakness was admitted for a comprehensive rehab program 02/22/21.  Upon arrival, patient was asleep in bed and required extra time and moderate verbal and tactile cues to rouse. Patient remained lethargic throughout the evaluation with overall Moderate verbal cues needed to maintain appropriate alertness and attention. SLP administered the Cognistat. Patient scored WFL on all subtests with the exception of mild impairments in short-term recall and problem solving. Deficits in attention to left visual field/body were also evident. Patient with mild left oral-motor weakness resulting in mildly imprecise consonants which was further exacerbated by fatigue. However, patient remained grossly intelligible throughout. Patient observed with a snack of regular textures with thin liquids. Patient was impulsive with PO intake and mild oral residue and left anterior spillage was noted with liquids and solids. Patient required cues to attend to anterior spillage but independently utilized a finger sweep to clear left lateral sulci. One overt coughing episode was noted with mixed consistencies, suspect due to bolus size and rate of intake. Recommend patient continue current diet of regular textures with thin liquids but with full supervision to maximize  utilization of swallowing compensatory strategies and safety with PO  intake. Patient's wife present throughout and educated on results of SLP evaluation. She verbalized understanding and all questions were answered. Patient would benefit from skilled SLP intervention to maximize his cognitive and swallowing function prior to discharge.    Skilled Therapeutic Interventions          Administered a cognitive-linguistic evaluation and BSE, please see above for details.   SLP Assessment  Patient will need skilled Speech Lanaguage Pathology Services during CIR admission    Recommendations  SLP Diet Recommendations: Age appropriate regular solids;Thin Liquid Administration via: Cup;Straw Medication Administration: Whole meds with liquid Supervision: Patient able to self feed;Full supervision/cueing for compensatory strategies Compensations: Slow rate;Minimize environmental distractions;Small sips/bites;Lingual sweep for clearance of pocketing;Other (Comment) (finger sweep) Postural Changes and/or Swallow Maneuvers: Seated upright 90 degrees Oral Care Recommendations: Oral care BID Recommendations for Other Services: Neuropsych consult Patient destination: Home Follow up Recommendations: 24 hour supervision/assistance;Outpatient SLP Equipment Recommended: None recommended by SLP    SLP Frequency 3 to 5 out of 7 days   SLP Duration  SLP Intensity  SLP Treatment/Interventions 4 weeks  Minumum of 1-2 x/day, 30 to 90 minutes  Cognitive remediation/compensation;Dysphagia/aspiration precaution training;Internal/external aids;Cueing hierarchy;Environmental controls;Therapeutic Activities;Functional tasks;Patient/family education;Speech/Language facilitation    Pain No/Denies Pain   Prior Functioning Type of Home: House  Lives With: Spouse;Family (2 young sons, 2 y/o and 56 y/o) Available Help at Discharge: Family;Available 24 hours/day  SLP Evaluation Cognition Overall Cognitive Status:  Impaired/Different from baseline Arousal/Alertness: Awake/alert Year: 2022 Month: September Day of Week: Correct Attention: Sustained Sustained Attention: Impaired Sustained Attention Impairment: Verbal basic;Functional basic Memory: Impaired Memory Impairment: Storage deficit;Decreased recall of new information;Decreased short term memory Immediate Memory Recall: Sock;Blue;Bed Memory Recall Sock: Without Cue Memory Recall Blue: Without Cue Memory Recall Bed: Without Cue Awareness: Impaired Awareness Impairment: Intellectual impairment Problem Solving: Impaired Problem Solving Impairment: Functional basic Behaviors: Impulsive Safety/Judgment: Impaired Comments: Pt impulsive with movement while completing tasks EOB, decreased insights into deficits including his fall risk  Comprehension Auditory Comprehension Overall Auditory Comprehension: Appears within functional limits for tasks assessed Expression Expression Primary Mode of Expression: Verbal Verbal Expression Overall Verbal Expression: Appears within functional limits for tasks assessed Written Expression Dominant Hand: Right Oral Motor Oral Motor/Sensory Function Overall Oral Motor/Sensory Function: Mild impairment Facial ROM: Reduced left;Suspected CN VII (facial) dysfunction Facial Symmetry: Abnormal symmetry left;Suspected CN VII (facial) dysfunction Facial Sensation: Reduced left;Suspected CN V (Trigeminal) dysfunction Lingual Symmetry: Abnormal symmetry left;Suspected CN XII (hypoglossal) dysfunction Velum: Within Functional Limits Motor Speech Overall Motor Speech: Impaired Respiration: Within functional limits Phonation: Normal Resonance: Within functional limits Articulation: Impaired Level of Impairment: Sentence Motor Planning: Witnin functional limits Effective Techniques: Over-articulate  Care Tool Care Tool Cognition Ability to hear (with hearing aid or hearing appliances if normally used Ability  to hear (with hearing aid or hearing appliances if normally used): 0. Adequate - no difficulty in normal conservation, social interaction, listening to TV   Expression of Ideas and Wants Expression of Ideas and Wants: 3. Some difficulty - exhibits some difficulty with expressing needs and ideas (e.g, some words or finishing thoughts) or speech is not clear   Understanding Verbal and Non-Verbal Content Understanding Verbal and Non-Verbal Content: 3. Usually understands - understands most conversations, but misses some part/intent of message. Requires cues at times to understand  Memory/Recall Ability Memory/Recall Ability : Location of own room;Staff names and faces;That he or she is in a hospital/hospital unit   Bedside Swallowing Assessment General Date of Onset: 02/15/21 Previous Swallow Assessment:  BSE on9/15: Recommended Dys. 3 textures with thin liquids but has since been upgrade to regular textures Diet Prior to this Study: Regular;Thin liquids Temperature Spikes Noted: No Respiratory Status: Room air History of Recent Intubation: Yes Date extubated: 02/15/21 Behavior/Cognition: Cooperative;Pleasant mood;Lethargic/Drowsy Oral Cavity - Dentition: Adequate natural dentition Self-Feeding Abilities: Able to feed self Vision: Functional for self-feeding Patient Positioning: Upright in bed Baseline Vocal Quality: Normal Volitional Cough: Strong Volitional Swallow: Able to elicit  Thin Liquid Thin Liquid: Impaired Presentation: Cup;Straw Oral Phase Functional Implications: Left anterior spillage Nectar Thick Nectar Thick Liquid: Not tested Honey Thick Honey Thick Liquid: Not tested Puree Puree: Not tested Solid Solid: Impaired Presentation: Self Fed Oral Phase Functional Implications: Left lateral sulci pocketing;Oral residue Other Comments: 1 cough noted with mixed consistencies, suspect due to impulsivity BSE Assessment Risk for Aspiration Impact on safety and function: Mild  aspiration risk Other Related Risk Factors: Lethargy;Cognitive impairment  Short Term Goals: Week 1: SLP Short Term Goal 1 (Week 1): Patient will consume current diet with minimal overt s/s of aspiration with Min verbal cues for use of swallowing compensatory strategies. SLP Short Term Goal 2 (Week 1): Patient will utilize speech intelligibility strategies at the sentence level to improve articulatory precision with supervision level verbal cues. SLP Short Term Goal 3 (Week 1): Patient will demosntrate sustained attention to functional tasks for 15 minutes with Mod verbal cues for redirection. SLP Short Term Goal 4 (Week 1): Patient will demonstrate functional problem solving for basic and familiar tasks with Mod verbal cues. SLP Short Term Goal 5 (Week 1): Patient will utilize external memory aids to recall new, daily information with Mod verbal cues.  Refer to Care Plan for Long Term Goals  Recommendations for other services: Neuropsych  Discharge Criteria: Patient will be discharged from SLP if patient refuses treatment 3 consecutive times without medical reason, if treatment goals not met, if there is a change in medical status, if patient makes no progress towards goals or if patient is discharged from hospital.  The above assessment, treatment plan, treatment alternatives and goals were discussed and mutually agreed upon: by patient and by family  Myonna Chisom 02/23/2021, 2:52 PM

## 2021-02-23 NOTE — IPOC Note (Signed)
Overall Plan of Care [facility]) Patient Details Name: David Craig MRN: [id number] DOB: [birth year]  Admitting Diagnosis: Cerebrovascular accident (CVA) due to occlusion of right posterior cerebral artery [facility])  Hospital Problems: Principal Problem:   Cerebrovascular accident (CVA) due to occlusion of right posterior cerebral artery (HCC)     Functional Problem List: Nursing Bladder, Edema, Endurance, Medication Management, Safety, Perception  PT Balance, Behavior, Endurance, Motor, Pain, Perception, Safety  OT Balance, Behavior, Vision, Cognition, Endurance, Motor, Perception, Safety  SLP Cognition, Linguistic, Nutrition, Safety  TR         Basic ADL's: OT Eating, Grooming, Bathing, Dressing, Toileting     Advanced  ADL's: OT Simple Meal Preparation     Transfers: PT Bed Mobility, Bed to Chair, Car, Occupational psychologist, Research scientist (life sciences): PT Ambulation, Educational psychologist, Psychologist, prison and probation services     Additional Impairments: OT Fuctional Use of Upper Extremity  SLP Swallowing, Communication, Social Cognition expression Social Interaction, Problem Solving, Memory, Attention, Awareness  TR      Anticipated Outcomes Item Anticipated Outcome  Self Feeding Supervision  Swallowing  Mod I   Basic self-care  Supervision-Min A  Toileting  Min A   Bathroom Transfers Supervision  Bowel/Bladder  min assist  Transfers  CGA  Locomotion  CGA  Communication  Mod I  Cognition  Supervision  Pain  n/a  Safety/Judgment  min assist and no falls   Therapy Plan: PT Intensity: Minimum of 1-2 x/day ,45 to 90 minutes PT Frequency: 5 out of 7 days PT Duration Estimated Length of Stay: 3.5-4 weeks OT Intensity: Minimum of 1-2 x/day, 45 to 90 minutes OT Frequency: 5 out of 7 days OT Duration/Estimated Length of Stay: 3 to 3.5 weeks SLP Intensity: Minumum of 1-2 x/day, 30 to 90 minutes SLP Frequency: 3 to 5 out of 7 days SLP Duration/Estimated Length of Stay: 4 weeks   Due to the  current state of emergency, patients may not be receiving their 3-hours of Medicare-mandated therapy.   Team Interventions: Nursing Interventions Patient/Family Education, Bladder Management, Disease Management/Prevention, Medication Management, Discharge Planning  PT interventions Ambulation/gait training, Community reintegration, DME/adaptive equipment instruction, Neuromuscular re-education, Psychosocial support, Stair training, UE/LE Strength taining/ROM, Wheelchair propulsion/positioning, Warden/ranger, Discharge planning, Functional electrical stimulation, Pain management, Skin care/wound management, Therapeutic Activities, UE/LE Coordination activities, Cognitive remediation/compensation, Disease management/prevention, Patient/family education, Functional mobility training, Therapeutic Exercise, Splinting/orthotics, Visual/perceptual remediation/compensation  OT Interventions Balance/vestibular training, Disease mangement/prevention, Neuromuscular re-education, Self Care/advanced ADL retraining, Therapeutic Exercise, Wheelchair propulsion/positioning, UE/LE Strength taining/ROM, Pain management, DME/adaptive equipment instruction, Cognitive remediation/compensation, Community reintegration, Functional electrical stimulation, Patient/family education, UE/LE Coordination activities, Splinting/orthotics, Discharge planning, Functional mobility training, Psychosocial support, Therapeutic Activities, Visual/perceptual remediation/compensation  SLP Interventions Cognitive remediation/compensation, Dysphagia/aspiration precaution training, Internal/external aids, Cueing hierarchy, Environmental controls, Therapeutic Activities, Functional tasks, Patient/family education, Speech/Language facilitation  TR Interventions    SW/CM Interventions Discharge Planning, Psychosocial Support, Patient/Family Education   Barriers to Discharge MD  Medical stability  Nursing Decreased caregiver support,  Home environment access/layout, Incontinence, Lack of/limited family support, Medication compliance, Weight Lives in 1 level home with 4 steps to enter and left rail. Lives with spouse and 2 children. Spouse can provide min assist at discharge.  PT Home environment access/layout    OT Home environment access/layout, Lack of/limited family support, Behavior    SLP      SW       Team Discharge Planning: Destination: PT-Home ,OT- Home , SLP-Home Projected Follow-up: PT-24 hour supervision/assistance, Home health PT, OT-  24 hour supervision/assistance, Home health OT, SLP-24 hour supervision/assistance, Outpatient SLP Projected Equipment Needs: PT-To be determined, OT- To be determined, SLP-None recommended by SLP Equipment Details: PT- , OT-  Patient/family involved in discharge planning: PT- Patient, Family member/caregiver,  OT-Patient, Family member/caregiver, SLP-Family member/caregiver  MD ELOS: 24-28 days Medical Rehab Prognosis:  Excellent Assessment: The patient has been admitted for CIR therapies with the diagnosis of right PCA infarct. The team will be addressing functional mobility, strength, stamina, balance, safety, adaptive techniques and equipment, self-care, bowel and bladder mgt, patient and caregiver education, NMR, visual-spatial awareness, pain mgt, egosupport. Goals have been set at supervision to min assist with self-care, CG assist with basic mobility and supervision with cognition .   Due to the current state of emergency, patients may not be receiving their 3 hours per day of Medicare-mandated therapy.    Ranelle Oyster, MD, FAAPMR     See Team Conference Notes for weekly updates to the plan of care

## 2021-02-23 NOTE — Progress Notes (Signed)
Inpatient Rehabilitation  Patient information reviewed and entered into eRehab system by Venetta Knee Zell Doucette, OTR/L.   Information including medical coding, functional ability and quality indicators will be reviewed and updated through discharge.    

## 2021-02-23 NOTE — Progress Notes (Signed)
Patient ID: David Craig, male   DOB: [birth year], 35 y.o.   MRN: [id number]  SW went to patient to complete assessment but pt was sleeping. Pt wife in room at time of visit. SW introduced self, explain role, and discussed discharge process. Pt wife reports there is support from patient's siblings who live across the street, and his parents live close. When discussing amount of support that can be provided during the day since others work, not quite sure. SW explained will continue to determine what his care needs are before discussing this further. Wife works from home and cares for their 1 and 37 y.o. boys. SW will make efforts to meet with pt to complete assessment.   Cecile Sheerer, MSW, LCSWA Office: [phone] Cell: [phone] Fax: [phone]

## 2021-02-24 NOTE — Progress Notes (Signed)
Occupational Therapy Session Note  Patient Details  Name: David Craig MRN: [id number] Date of Birth: [birth year]  Today's Date: 02/24/2021 OT Individual Time: 0233-4356 OT Individual Time Calculation (min): 42 min   Today's Date: 02/24/2021 OT Individual Time: 1133-1200 OT Individual Time Calculation (min): 27 min   Short Term Goals: Week 1:  OT Short Term Goal 1 (Week 1): Pt will complete BSC or toilet transfer with Max A without Stedy OT Short Term Goal 2 (Week 1): Pt will complete LB dressing with no more than Mod balance assistance in standing OT Short Term Goal 3 (Week 1): Pt will complete UB dressing with Mod A using hemi techniques  Skilled Therapeutic Interventions/Progress Updates:    Session 1:  Pt received in bed with no pain reported   ADL: Pt completes ADL at overall MOD A Level. Skilled interventions include: squat pivot transfer training with VC for waiting to initate, hand placement and L knee block. Pt mildly impulsive with movment but demo better sitting balance this date. Bathing UB completed seated at sink in w/c with L attention for all ADL items with question cues to find items on L. Pt able to recall L hemi strategy of threading LUE first but attempts to thread through head hole. Pt requris MOD A for STS at sink with L knee block using mirror for midline orientation. Grooming with cuing to recall toothpates application.  Neuromuscular Reeducation HOH A for scap mobilization on R sidelying for LUE facilitation for gravty eliminated L shoulder/elbow- reach forward/ull backwards for Sneads. With with shoulder flexion/elboe extension but no shoulder extension/elbow flexion. VC for visual attention to LUE>   Pt left at end of session in bed with exit alarm on, call light in reach and all needs met  Session 2:   Pt received in bed asleep with wife present and no pain. ADL: Pt completes ADL at overall MIN A level for UB and MAX A level for donning pants at sit to stand  level at EOB. Skilled interventions include: ADL items set up on L for L attention, min a/VC for sitting balance & midline orientation, multimodal & question cuing for recalling/executing hemi strategies and blocking of L knee during STS from EOB  Neuromuscular Reeducation Supine NMR elbow/flex exit with VC for visual fixation on LUE and shoulder flex/elbow extension as earlier in session but this time against gravity. Pt with noted elbow flexion when in supine trying to "touch your nose" but when including the shoulder pt unable to actively bend elbow and retract shoulder into bed.  Pt left at end of session in w/c with exit alarm on, call light in reach and all needs met   Therapy Documentation Precautions:  Precautions Precautions: Fall Precaution Comments: L hemi, L homonymous hemianopsia, R gaze preference Restrictions Weight Bearing Restrictions: No    Therapy/Group: Individual Therapy  Tonny Branch 02/24/2021, 6:56 AM

## 2021-02-24 NOTE — IPOC Note (Signed)
Overall Plan of Care [facility]) Patient Details Name: David Craig MRN: [id number] DOB: Aug 03, 1985  Admitting Diagnosis: Cerebrovascular accident (CVA) due to occlusion of right posterior cerebral artery [facility])  Hospital Problems: Principal Problem:   Cerebrovascular accident (CVA) due to occlusion of right posterior cerebral artery (HCC)     Functional Problem List: Nursing Bladder, Edema, Endurance, Medication Management, Safety, Perception  PT Balance, Behavior, Endurance, Motor, Pain, Perception, Safety  OT Balance, Behavior, Vision, Cognition, Endurance, Motor, Perception, Safety  SLP Cognition, Linguistic, Nutrition, Safety  TR         Basic ADL's: OT Eating, Grooming, Bathing, Dressing, Toileting     Advanced  ADL's: OT Simple Meal Preparation     Transfers: PT Bed Mobility, Bed to Chair, Car, Occupational psychologist, Research scientist (life sciences): PT Ambulation, Educational psychologist, Psychologist, prison and probation services     Additional Impairments: OT Fuctional Use of Upper Extremity  SLP Swallowing, Communication, Social Cognition expression Social Interaction, Problem Solving, Memory, Attention, Awareness  TR      Anticipated Outcomes Item Anticipated Outcome  Self Feeding Supervision  Swallowing  Mod I   Basic self-care  Supervision-Min A  Toileting  Min A   Bathroom Transfers Supervision  Bowel/Bladder  min assist  Transfers  CGA  Locomotion  CGA  Communication  Mod I  Cognition  Supervision  Pain  n/a  Safety/Judgment  min assist and no falls   Therapy Plan: PT Intensity: Minimum of 1-2 x/day ,45 to 90 minutes PT Frequency: 5 out of 7 days PT Duration Estimated Length of Stay: 3.5-4 weeks OT Intensity: Minimum of 1-2 x/day, 45 to 90 minutes OT Frequency: 5 out of 7 days OT Duration/Estimated Length of Stay: 3 to 3.5 weeks SLP Intensity: Minumum of 1-2 x/day, 30 to 90 minutes SLP Frequency: 3 to 5 out of 7 days SLP Duration/Estimated Length of Stay: 4 weeks   Due to the  current state of emergency, patients may not be receiving their 3-hours of Medicare-mandated therapy.   Team Interventions: Nursing Interventions Patient/Family Education, Bladder Management, Disease Management/Prevention, Medication Management, Discharge Planning  PT interventions Ambulation/gait training, Community reintegration, DME/adaptive equipment instruction, Neuromuscular re-education, Psychosocial support, Stair training, UE/LE Strength taining/ROM, Wheelchair propulsion/positioning, Warden/ranger, Discharge planning, Functional electrical stimulation, Pain management, Skin care/wound management, Therapeutic Activities, UE/LE Coordination activities, Cognitive remediation/compensation, Disease management/prevention, Patient/family education, Functional mobility training, Therapeutic Exercise, Splinting/orthotics, Visual/perceptual remediation/compensation  OT Interventions Balance/vestibular training, Disease mangement/prevention, Neuromuscular re-education, Self Care/advanced ADL retraining, Therapeutic Exercise, Wheelchair propulsion/positioning, UE/LE Strength taining/ROM, Pain management, DME/adaptive equipment instruction, Cognitive remediation/compensation, Community reintegration, Functional electrical stimulation, Patient/family education, UE/LE Coordination activities, Splinting/orthotics, Discharge planning, Functional mobility training, Psychosocial support, Therapeutic Activities, Visual/perceptual remediation/compensation  SLP Interventions Cognitive remediation/compensation, Dysphagia/aspiration precaution training, Internal/external aids, Cueing hierarchy, Environmental controls, Therapeutic Activities, Functional tasks, Patient/family education, Speech/Language facilitation  TR Interventions    SW/CM Interventions Discharge Planning, Psychosocial Support, Patient/Family Education   Barriers to Discharge MD  Medical stability  Nursing Decreased caregiver support,  Home environment access/layout, Incontinence, Lack of/limited family support, Medication compliance, Weight Lives in 1 level home with 4 steps to enter and left rail. Lives with spouse and 2 children. Spouse can provide min assist at discharge.  PT Home environment access/layout    OT Home environment access/layout, Lack of/limited family support, Behavior    SLP      SW       Team Discharge Planning: Destination: PT-Home ,OT- Home , SLP-Home Projected Follow-up: PT-24 hour supervision/assistance, Home health PT, OT-  24 hour supervision/assistance, Home health OT, SLP-24 hour supervision/assistance, Outpatient SLP Projected Equipment Needs: PT-To be determined, OT- To be determined, SLP-None recommended by SLP Equipment Details: PT- , OT-  Patient/family involved in discharge planning: PT- Patient, Family member/caregiver,  OT-Patient, Family member/caregiver, SLP-Family member/caregiver  MD ELOS: 21-24 days Medical Rehab Prognosis:  Excellent Assessment: The patient has been admitted for CIR therapies with the diagnosis of PCA infarct with left hemiparesis. The team will be addressing functional mobility, strength, stamina, balance, safety, adaptive techniques and equipment, self-care, bowel and bladder mgt, patient and caregiver education, NMR, cognition, visual-spatial awareness, ego support, community reentry. Goals have been set at supervision to min assist with self-care, contact guard assist with mobility and supervision with cognition.   Due to the current state of emergency, patients may not be receiving their 3 hours per day of Medicare-mandated therapy.    Ranelle Oyster, MD, FAAPMR     See Team Conference Notes for weekly updates to the plan of care

## 2021-02-24 NOTE — Progress Notes (Addendum)
Physical Therapy Session Note  Patient Details  Name: David Craig MRN: [id number] Date of Birth: 1985/09/10  Today's Date: 02/24/2021 Session 1: PT Amount of Missed Time (min): 30 Minutes PT Missed Treatment Reason: Patient fatigue Session 2: PT Individual Time: 1016-1130  PT Individual Time Calculation (min): 74 min    Short Term Goals: Week 1:  PT Short Term Goal 1 (Week 1): Pt will perform bed mobility consistently with minA. PT Short Term Goal 2 (Week 1): Pt will perform sit to stand transfer consistently with minA. PT Short Term Goal 3 (Week 1): Pt will perform bed to chair transfer consistently with minA. PT Short Term Goal 4 (Week 1): Pt will ambulate x50' with modA +1 and LRAD.  Skilled Therapeutic Interventions/Progress Updates:  Session 1: Patient supine in bed on entrance to room and snoring. Unable to fully rouse pt with verbal and tactile attempts to wake. Pt turns away from therapist and continuing to sleep/ snore. Will attempt additional time at return visit later in morning.  Session 2: Patient supine in bed on entrance to room. Patient alert and agreeable to PT session, though is slow to initiate participation. Plays music on phone prior to initiating bed mobility.   Patient with no pain complaint throughout session.  Therapeutic Activity: Bed Mobility: Patient performed supine --> sit with Mod A. VC/ tc required for technique to reach squared position on EOB. Pt able to perform scoot along EOB to position properly prior to transfer to w/c. At end of session, pt able to return to supine with light Min A for LLE to bed surface.  Transfers: Patient performed squat pivot transfer to R side from bed to w/c with Min/ Mod A. Sit<>stand transfers within parallel bars with CGA. Heavy lean to R side. Squat pivot transfer w/c to bed toward R side with Min A at end of session. VC throughout for technique, hand position, foot position, forward lean for proper unweighting.    Neuromuscular Re-ed: NMR facilitated during session with focus on standing balance, proprioception, visual field, muscle activation/ facilitation. Pt guided in standing in parallel bars, progressed to lateral weight shifting requiring vc/ tc for proper pelvic shift as pt tending to twist trunk and pelvis posteriorly and to the L to initiate and produce weight shift initially. Then progressed to fwd/ bkwd stepping with RLE to facilitate L quad activation requiring physical assist to block L knee in extension. Next progressed pt to minisquats with pt able to progress performance to shift weight to midline and then with bias over LLE with good L quad activation and minimal guard needed for LLE. NMR performed for improvements in motor control and coordination, balance, sequencing, judgement, and self confidence/ efficacy in performing all aspects of mobility at highest level of independence.   Gait Training:  Patient ambulated 38 ft using R sided HR with Mod A for progression of LLE. Wife providing +2 for w/c follow. Initially able to initiate LLE advancement requiring assist to clear, complete step, and place foot. Improves slightly in amount of advancement. But requires assist throughout for L knee block, proper weight shift, pelvic advancement and upright trunk. Provided vc/ tc for sequencing and maintaining upright posture. Pt fatigued at end of gait bout and requesting end of session and return to bed.   Patient supine  in bed at end of session with brakes locked, bed alarm set, and all needs within reach.  Wife present.  Therapy Documentation Precautions:  Precautions Precautions: Fall Precaution Comments: L hemi,  L homonymous hemianopsia, R gaze preference Restrictions Weight Bearing Restrictions: No  Pain:  No pain complaint this session.   Therapy/Group: Individual Therapy  Loel Dubonnet PT, DPT 02/24/2021, 8:30 AM

## 2021-02-24 NOTE — Progress Notes (Signed)
PROGRESS NOTE   Subjective/Complaints: No new issues. Rested last night. No pain complaints  ROS: Patient denies fever, rash, sore throat, blurred vision, nausea, vomiting, diarrhea, cough, shortness of breath or chest pain, joint or back pain, headache, or mood change.     Objective:   No results found. Recent Labs    02/22/21 0339 02/23/21 0602  WBC 7.7 7.6  HGB 16.0 16.2  HCT 45.4 46.1  PLT 166 163   Recent Labs    02/23/21 0602  NA 137  K 3.6  CL 105  CO2 25  GLUCOSE 108*  BUN 13  CREATININE 0.63  CALCIUM 8.8*    Intake/Output Summary (Last 24 hours) at 02/24/2021 1154 Last data filed at 02/24/2021 0524 Gross per 24 hour  Intake 480 ml  Output 833 ml  Net -353 ml        Physical Exam: Vital Signs Blood pressure 114/81, pulse (!) 55, temperature 98.2 F (36.8 C), temperature source Oral, resp. rate 18, height 5\' 8"  (1.727 m), weight 98.7 kg, SpO2 98 %.  Constitutional: No distress . Vital signs reviewed. HEENT: NCAT, EOMI, oral membranes moist Neck: supple Cardiovascular: RRR without murmur. No JVD    Respiratory/Chest: CTA Bilaterally without wheezes or rales. Normal effort    GI/Abdomen: BS +, non-tender, non-distended Ext: no clubbing, cyanosis, or edema Psych: pleasant and cooperative  Skin: Clean and intact without signs of breakdown Neuro:  oriented to person, place, date and reason. Left central 7 and left HH, vision blurred, decreased lateral tracking. LUE with 1/5 delts biceps, 0/5 distally. LLE 1+ HF, KE and 0/5 distally. Senses pain on left. RUE and RLE 4-5/5.  Musculoskeletal: Full ROM, No pain with AROM or PROM in the neck, trunk, or extremities.      Assessment/Plan: 1. Functional deficits which require 3+ hours per day of interdisciplinary therapy in a comprehensive inpatient rehab setting. Physiatrist is providing close team supervision and 24 hour management of active medical  problems listed below. Physiatrist and rehab team continue to assess barriers to discharge/monitor patient progress toward functional and medical goals  Care Tool:  Bathing    Body parts bathed by patient: Chest, Abdomen, Front perineal area, Buttocks, Right upper leg, Left upper leg, Face   Body parts bathed by helper: Right arm, Left arm     Bathing assist Assist Level: 2 Helpers     Upper Body Dressing/Undressing Upper body dressing   What is the patient wearing?: Pull over shirt    Upper body assist Assist Level: Maximal Assistance - Patient 25 - 49%    Lower Body Dressing/Undressing Lower body dressing      What is the patient wearing?: Pants     Lower body assist Assist for lower body dressing: Maximal Assistance - Patient 25 - 49%     Toileting Toileting    Toileting assist Assist for toileting: Moderate Assistance - Patient 50 - 74%     Transfers Chair/bed transfer  Transfers assist     Chair/bed transfer assist level: Moderate Assistance - Patient 50 - 74%     Locomotion Ambulation   Ambulation assist      Assist level: 2 helpers  Assistive device:  (R hand rail) Max distance: 25'   Walk 10 feet activity   Assist     Assist level: 2 helpers Assistive device:  (R hand rail)   Walk 50 feet activity   Assist Walk 50 feet with 2 turns activity did not occur: Safety/medical concerns         Walk 150 feet activity   Assist Walk 150 feet activity did not occur: Safety/medical concerns         Walk 10 feet on uneven surface  activity   Assist Walk 10 feet on uneven surfaces activity did not occur: Safety/medical concerns         Wheelchair     Assist Is the patient using a wheelchair?: Yes Type of Wheelchair: Manual    Wheelchair assist level: Dependent - Patient 0% Max wheelchair distance: 150'    Wheelchair 50 feet with 2 turns activity    Assist        Assist Level: Dependent - Patient 0%    Wheelchair 150 feet activity     Assist      Assist Level: Dependent - Patient 0%   Blood pressure 114/81, pulse (!) 55, temperature 98.2 F (36.8 C), temperature source Oral, resp. rate 18, height 5\' 8"  (1.727 m), weight 98.7 kg, SpO2 98 %.  Medical Problem List and Plan: 1.  Left-sided weakness secondary to right PCA infarction secondary to terminal right ICA occlusion status post failed mechanical thrombectomy etiology likely secondary to cocaine use/vasospasm             -patient may  shower             -ELOS/Goals: 24-28 days with min assist to supervision goals  -Continue CIR therapies including PT, OT, and SLP   -grounds pass ordered 2.  Antithrombotics: -DVT/anticoagulation: Lower extremity Dopplers negative Pharmaceutical: Lovenox             -antiplatelet therapy: Aspirin 81 mg daily and Plavix 75 mg daily x90 days then aspirin alone 3. Pain Management: Tylenol as needed  -denies pain today 4. Mood/sleep: Provide emotional support  -scheduled trazodone at HS             -antipsychotic agents: N/A 5. Neuropsych: This patient is capable of making decisions on his own behalf. 6. Skin/Wound Care: Routine skin checks. Encourage appropriate nutrition 7. Fluids/Electrolytes/Nutrition: encourage PO  -labs reviewed   8.  Polysubstance abuse.  Provide counseling 9.  Hyperlipidemia.  Lipitor  10.  Obesity.  BMI 34.97.  Dietary follow-up 11. Dysphagia:  Has been advanced to regular diet              -make sure patient is up and at 90 degrees to eat             -small bites, take extra time, etc    LOS: 2 days A FACE TO FACE EVALUATION WAS PERFORMED  02/24/2021, 11:54 AM

## 2021-02-25 NOTE — Progress Notes (Signed)
Physical Therapy Session Note  Patient Details  Name: David Craig MRN: [id number] Date of Birth: 05-Dec-1985  Today's Date: 02/25/2021 PT Individual Time: 8270-7867 PT Individual Time Calculation (min): 25 min   Short Term Goals: Week 1:  PT Short Term Goal 1 (Week 1): Pt will perform bed mobility consistently with minA. PT Short Term Goal 2 (Week 1): Pt will perform sit to stand transfer consistently with minA. PT Short Term Goal 3 (Week 1): Pt will perform bed to chair transfer consistently with minA. PT Short Term Goal 4 (Week 1): Pt will ambulate x50' with modA +1 and LRAD.  Skilled Therapeutic Interventions/Progress Updates:    Session focused on NMR to address transitional movements, activation in LUE/LLE during functional mobility, and postural control retraining during bed mobility (rolling R and L and supine <> sit both R and L), scooting EOB, forced use and weightbearing in LUE, and squat pivot transfer to the R.   Pt requires cues for attention and management of L hemi body as well for overall slowing of movement for increased safety and intentional movement. Pt attempts to use compensatory strategies for bed mobility but slowed down and broke down tasks in to parts to increase functional use of L hemibody during transitions especially with rolling and supine <> sit. Education provided to patient and wife throughout session on importance of awareness of L hemi body during mobility and positioning at rest to promote integrity of joints and for overall safety . Verbalized understanding.  Seated EOB focused on self dressing technique recall from OT sessions yesterday requiring mod verbal cues and some hand over hand assist due to pt rushing through task and decreased attention to the positioning of the shirt (inside out, put head through first after verbalizing need to put LUE through arm sleeve first, etc). Pt maintains balance with close supervision. Forced weighbearing through LUE to  activate musculature in seated position x 5 reps.   Rolling to the L requires increased cues for position of LUE and attention to LLE placement with mod assist to facilitate at trunk as pt just wants to pull on therapist. Performed mod assist squat pivot transfer to the R to the recliner with focus on technique, attention to L foot placement, management of LUE, and slowing movement to allow for more control of transfer. All safety mechanisms in place and call bell in reach. Wife at bedside.   Therapy Documentation Precautions:  Precautions Precautions: Fall Precaution Comments: L hemi, L homonymous hemianopsia, R gaze preference Restrictions Weight Bearing Restrictions: No  Pain:  Denies pain.    Therapy/Group: Individual Therapy  Karolee Stamps Darrol Poke, PT, DPT, CBIS  02/25/2021, 10:39 AM
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
## 2021-02-26 NOTE — Progress Notes (Signed)
PROGRESS NOTE   Subjective/Complaints: Pt says he slept. Denies pain, headache. Sexually inappropriate activity reported by rn.   ROS: Patient denies fever, rash, sore throat, blurred vision, nausea, vomiting, diarrhea, cough, shortness of breath or chest pain, joint or back pain, headache, or mood change.    Objective:   No results found. No results for input(s): WBC, HGB, HCT, PLT in the last 72 hours.  No results for input(s): NA, K, CL, CO2, GLUCOSE, BUN, CREATININE, CALCIUM in the last 72 hours.   Intake/Output Summary (Last 24 hours) at 02/26/2021 0913 Last data filed at 02/25/2021 1900 Gross per 24 hour  Intake 120 ml  Output 350 ml  Net -230 ml        Physical Exam: Vital Signs Blood pressure 117/83, pulse 60, temperature 98.1 F (36.7 C), resp. rate 19, height 5\' 8"  (1.727 m), weight 98.7 kg, SpO2 96 %.  Constitutional: No distress . Vital signs reviewed. HEENT: NCAT, EOMI, oral membranes moist Neck: supple Cardiovascular: RRR without murmur. No JVD    Respiratory/Chest: CTA Bilaterally without wheezes or rales. Normal effort    GI/Abdomen: BS +, non-tender, non-distended Ext: no clubbing, cyanosis, or edema Psych: pleasant and cooperative  Skin: Clean and intact without signs of breakdown Neuro:  oriented to person, place, date and reason. Left central 7 and left HH, vision blurred, decreased lateral tracking. LUE with 1/5 delts biceps, 0/5 distally. LLE 1+ HF, KE and 0/5 distally. Senses pain on left. RUE and RLE 4-5/5.  Musculoskeletal: Full ROM, No pain with AROM or PROM in the neck, trunk, or extremities.      Assessment/Plan: 1. Functional deficits which require 3+ hours per day of interdisciplinary therapy in a comprehensive inpatient rehab setting. Physiatrist is providing close team supervision and 24 hour management of active medical problems listed below. Physiatrist and rehab team continue to  assess barriers to discharge/monitor patient progress toward functional and medical goals  Care Tool:  Bathing    Body parts bathed by patient: Chest, Abdomen, Front perineal area, Buttocks, Right upper leg, Left upper leg, Face   Body parts bathed by helper: Right arm, Left arm     Bathing assist Assist Level: 2 Helpers     Upper Body Dressing/Undressing Upper body dressing   What is the patient wearing?: Pull over shirt    Upper body assist Assist Level: Maximal Assistance - Patient 25 - 49%    Lower Body Dressing/Undressing Lower body dressing      What is the patient wearing?: Pants     Lower body assist Assist for lower body dressing: Maximal Assistance - Patient 25 - 49%     Toileting Toileting    Toileting assist Assist for toileting: Independent with assistive device Assistive Device Comment: urinal   Transfers Chair/bed transfer  Transfers assist     Chair/bed transfer assist level: Moderate Assistance - Patient 50 - 74%     Locomotion Ambulation   Ambulation assist      Assist level: 2 helpers Assistive device:  (R hand rail) Max distance: 25'   Walk 10 feet activity   Assist     Assist level: 2 helpers Assistive device:  (  R hand rail)   Walk 50 feet activity   Assist Walk 50 feet with 2 turns activity did not occur: Safety/medical concerns         Walk 150 feet activity   Assist Walk 150 feet activity did not occur: Safety/medical concerns         Walk 10 feet on uneven surface  activity   Assist Walk 10 feet on uneven surfaces activity did not occur: Safety/medical concerns         Wheelchair     Assist Is the patient using a wheelchair?: Yes Type of Wheelchair: Manual    Wheelchair assist level: Dependent - Patient 0% Max wheelchair distance: 150'    Wheelchair 50 feet with 2 turns activity    Assist        Assist Level: Dependent - Patient 0%   Wheelchair 150 feet activity      Assist      Assist Level: Dependent - Patient 0%   Blood pressure 117/83, pulse 60, temperature 98.1 F (36.7 C), resp. rate 19, height 5\' 8"  (1.727 m), weight 98.7 kg, SpO2 96 %.  Medical Problem List and Plan: 1.  Left-sided weakness secondary to right PCA infarction secondary to terminal right ICA occlusion status post failed mechanical thrombectomy etiology likely secondary to cocaine use/vasospasm             -patient may  shower             -ELOS/Goals: 24-28 days with min assist to supervision goals  -Continue CIR therapies including PT, OT, and SLP    -grounds pass ordered 2.  Antithrombotics: -DVT/anticoagulation: Lower extremity Dopplers negative Pharmaceutical: Lovenox             -antiplatelet therapy: Aspirin 81 mg daily and Plavix 75 mg daily x90 days then aspirin alone 3. Pain Management: Tylenol as needed  -denies pain today 4. Mood/sleep: Provide emotional support  -scheduled trazodone at HS             -antipsychotic agents: N/A 5. Neuropsych: This patient is capable of making decisions on his own behalf. 6. Skin/Wound Care: Routine skin checks. Encourage appropriate nutrition 7. Fluids/Electrolytes/Nutrition: encourage PO  -good po intake 8.  Polysubstance abuse.  Provide counseling 9.  Hyperlipidemia.  Lipitor  10.  Obesity.  BMI 34.97.  Dietary follow-up 11. Dysphagia:  Has been advanced to regular diet              -make sure patient is up and at 90 degrees to eat             -small bites, take extra time, etc    LOS: 4 days A FACE TO FACE EVALUATION WAS PERFORMED  02/26/2021, 9:13 AM

## 2021-02-26 NOTE — Progress Notes (Signed)
Patient ID: David Craig, male   DOB: 1986/03/15, 35 y.o.   MRN: [id number]  SW made efforts to meet with pt but pt sleeping. SW informed pt mother will try to meet with pt another time.  Cecile Sheerer, MSW, LCSWA Office: [phone] Cell: [phone] Fax: [phone]

## 2021-02-26 NOTE — Progress Notes (Signed)
   02/26/21 1130  Clinical Encounter Type  Visited With Family;Patient not available  Visit Type Initial  Referral From Nurse  Consult/Referral To Chaplain   Chaplain responded to the consult request for an Advanced Directive. The patient was not in the room. His mother, Elease Hashimoto was at the bedside. She stated she would inform him of my visit but doubt he requested an AD because his wife, Seward Grater handles everything. Chaplain provided spiritual support to the mother and actively listened as she spoke of her son talking more about his faith since he has been in the hospital. Visit ended with prayer. This note was prepared by Deneen Harts, M.Div..  For questions please contact by phone [phone].

## 2021-02-26 NOTE — Progress Notes (Signed)
Occupational Therapy Session Note  Patient Details  Name: David Craig MRN: [id number] Date of Birth: [birth year]  Today's Date: 02/26/2021 OT Individual Time: 1300-1330 OT Individual Time Calculation (min): 30 min    Short Term Goals: Week 1:  OT Short Term Goal 1 (Week 1): Pt will complete BSC or toilet transfer with Max A without Stedy OT Short Term Goal 2 (Week 1): Pt will complete LB dressing with no more than Mod balance assistance in standing OT Short Term Goal 3 (Week 1): Pt will complete UB dressing with Mod A using hemi techniques  Skilled Therapeutic Interventions/Progress Updates:     Pt received in bed with male +2 present. Pt recognizes clinician from Saturday able to state what was worked on over the weekend. NO family present. Ot slightly shorter verbally with direct VC for hand placement to transfer into chair. Pt with no incidence of touching or groping therapist this date  however intermittent visual fixation on Ots chest. Pt requires MOD A for squat pivot transfer to R and MAX A to L at end of session.   Neuromuscular Reeducation Seated at tabletop pt participates in towel slides for Ramos- shoulder sliding forward/backward on the table and elbow flexion/extension. Pt with decreased activation noted with max cuing provided for visual attention. Pt frustrated that "you are helping my hand move but you wont let me help my hand move." OT explained that this clinician needs to feel the percentage of help to know if arm is improving. Pt verbalized understanding. Pt with improved activation away from the table "pushing" OT away- no "pulling" retraction/elbow felx & shoulder ext noted. Pt with improved activation with these motions in sidelying and supine.   Before returning to bed with w/c set up, pt asks, "Can you tell me how it is ok to come on to a nurse." OT clarified if pt was trying to hit on this clinician or another staff member beofre educating that it is not appropriate to  hit on strangers/healthcare workers and redirected that is it not fair to his wife. Pt states, "well I dont need you condemning me, I need you to help me." OT acknowledges pt is having strong urges and feelings but continues to reinforce that pt focus should be on personal recovery and health as it is not appropriate to advance on staff. Pt left at end of session in bed with exit alarm on, call light in reach and all needs met   Therapy Documentation Precautions:  Precautions Precautions: Fall Precaution Comments: L hemi, L homonymous hemianopsia, R gaze preference Restrictions Weight Bearing Restrictions: No General:    Therapy/Group: Individual Therapy  Tonny Branch 02/26/2021, 6:58 AM

## 2021-02-26 NOTE — Progress Notes (Signed)
Occupational Therapy Session Note  Patient Details  Name: David Craig MRN: [id number] Date of Birth: 05/18/1986  Today's Date: 02/26/2021 OT Individual Time: 0735-0800 OT Individual Time Calculation (min): 25 min  and Today's Date: 02/26/2021 OT Missed Time: 50 Minutes Missed Time Reason: Other (comment) (inappropriate with therapist)   Short Term Goals: Week 1:  OT Short Term Goal 1 (Week 1): Pt will complete BSC or toilet transfer with Max A without Stedy OT Short Term Goal 2 (Week 1): Pt will complete LB dressing with no more than Mod balance assistance in standing OT Short Term Goal 3 (Week 1): Pt will complete UB dressing with Mod A using hemi techniques  Skilled Therapeutic Interventions/Progress Updates:    Pt greeted asleep in bed, easy to wake and agreeable to OT treatment session. Pt able to follow commands and was assisted into sitting EOB with mod A to elevate trunk. Pt making inappropriate comments to therapist with OT attempting to redirect pt to ADL task. Tried talking about his wife and family to also help redirect patient. Educated on modification for doffing shirt at EOB. OT then provided hand over hand A to integrate L UE into bathing tasks for neuro re-ed while seated EOB. Pt then grabbed therapists buttocks and attempted to pull OT into bed. OT educated on behaviors being inappropriate and continued to try to redirect pt. Pt with erection and continued to make lude commends, so OT ended session early due to sexual aggression. OT will discuss with team on how to manage care with male therapists or male Techs to assist. Will follow up per plan of care.  Therapy Documentation Precautions:  Precautions Precautions: Fall Precaution Comments: L hemi, L homonymous hemianopsia, R gaze preference Restrictions Weight Bearing Restrictions: No General: General OT Amount of Missed Time: 50 Minutes  Denies pain   Therapy/Group: Individual Therapy  Mal Amabile 02/26/2021,  8:08 AM

## 2021-02-26 NOTE — Progress Notes (Signed)
Speech Language Pathology Daily Session Note  Patient Details  Name: David Craig MRN: [id number] Date of Birth: [birth year]  Today's Date: 02/26/2021 SLP Individual Time: 1415-1500 SLP Individual Time Calculation (min): 45 min  Short Term Goals: Week 1: SLP Short Term Goal 1 (Week 1): Patient will consume current diet with minimal overt s/s of aspiration with Min verbal cues for use of swallowing compensatory strategies. SLP Short Term Goal 2 (Week 1): Patient will utilize speech intelligibility strategies at the sentence level to improve articulatory precision with supervision level verbal cues. SLP Short Term Goal 3 (Week 1): Patient will demosntrate sustained attention to functional tasks for 15 minutes with Mod verbal cues for redirection. SLP Short Term Goal 4 (Week 1): Patient will demonstrate functional problem solving for basic and familiar tasks with Mod verbal cues. SLP Short Term Goal 5 (Week 1): Patient will utilize external memory aids to recall new, daily information with Mod verbal cues.  Skilled Therapeutic Interventions: Skilled treatment session focused on dysphagia and cognitive goals. SLP facilitated session by providing skilled observation with snack of regular textures and thin liquids. Patient consumed meal with overt cough X 1 and required Mod verbal and tactile cues for use of swallowing compensatory strategies. Recommend to continue current diet with full supervision. Patient's mother present and provided education regarding current swallowing function and compensatory strategies. She verbalized understanding and provided appropriate cueing, therefore, recommend that she can provide supervision with meals. SLP also facilitated session by providing supervision-Min A verbal cues for functional problem solving during a basic money and medication management task. Patient demonstrated appropriate attention to task for ~25 minutes with Min verbal cues. Patient left upright in bed  with alarm on and all needs within reach. Continue with current plan of care.      Pain No/Denies Pain   Therapy/Group: Individual Therapy  [name] 02/26/2021, 3:14 PM

## 2021-02-26 NOTE — Care Management (Signed)
[facility]patient Rehabilitation [facility] Individual Statement of Services  Patient Name:  David Craig  Date:  02/26/2021  Welcome to the Inpatient Rehabilitation Center.  Our goal is to provide you with an individualized program based on your diagnosis and situation, designed to meet your specific needs.  With this comprehensive rehabilitation program, you will be expected to participate in at least 3 hours of rehabilitation therapies Monday-Friday, with modified therapy programming on the weekends.  Your rehabilitation program will include the following services:  Physical Therapy (PT), Occupational Therapy (OT), Speech Therapy (ST), 24 hour per day rehabilitation nursing, Therapeutic Recreaction (TR), Psychology, Neuropsychology, Care Coordinator, Rehabilitation Medicine, Nutrition Services, Pharmacy Services, and Other  Weekly team conferences will be held on Tuesdays to discuss your progress.  Your Inpatient Rehabilitation Care Coordinator will talk with you frequently to get your input and to update you on team discussions.  Team conferences with you and your family in attendance may also be held.  Expected length of stay: 3-4 weeks  Overall anticipated outcome: Contact Guard to Supervision  Depending on your progress and recovery, your program may change. Your Inpatient Rehabilitation Care Coordinator will coordinate services and will keep you informed of any changes. Your Inpatient Rehabilitation Care Coordinator's name and contact numbers are listed  below.  The following services may also be recommended but are not provided by the Inpatient Rehabilitation Center:  Driving Evaluations Home Health Rehabiltiation Services Outpatient Rehabilitation Services Vocational Rehabilitation   Arrangements will be made to provide these services after discharge if needed.  Arrangements include referral to agencies that provide these services.  Your insurance has been verified to be:  BCBS  Your primary  doctor is:  No PCP  Pertinent information will be shared with your doctor and your insurance company.  Inpatient Rehabilitation Care Coordinator:  Susie Cassette [phone] or ([phone]  Information discussed with and copy given to patient by: Gretchen Short, 02/26/2021, 1:00 PM

## 2021-02-27 MED ORDER — QUETIAPINE FUMARATE 25 MG PO TABS
25.0000 mg | ORAL_TABLET | Freq: Every day | ORAL | Status: DC
  Administered 2021-02-27 – 2021-03-04 (×6): 25 mg via ORAL
  Filled 2021-02-27 (×7): qty 1

## 2021-02-27 MED ORDER — MAGNESIUM GLUCONATE 500 MG PO TABS
500.0000 mg | ORAL_TABLET | Freq: Every day | ORAL | Status: DC
  Administered 2021-02-27 – 2021-03-22 (×24): 500 mg via ORAL
  Filled 2021-02-27 (×25): qty 1

## 2021-02-27 MED ORDER — QUETIAPINE FUMARATE 25 MG PO TABS
25.0000 mg | ORAL_TABLET | Freq: Every evening | ORAL | Status: DC | PRN
  Administered 2021-03-02 – 2021-03-21 (×9): 25 mg via ORAL
  Filled 2021-02-27 (×9): qty 1

## 2021-02-27 MED ORDER — B COMPLEX-C PO TABS
1.0000 | ORAL_TABLET | Freq: Every day | ORAL | Status: DC
  Administered 2021-02-27 – 2021-03-22 (×24): 1 via ORAL
  Filled 2021-02-27 (×26): qty 1

## 2021-02-27 NOTE — Progress Notes (Signed)
PROGRESS NOTE   Subjective/Complaints: No major issues overnight. Working with PT early this morning.   ROS: Patient denies fever, rash, sore throat, blurred vision, nausea, vomiting, diarrhea, cough, shortness of breath or chest pain, joint or back pain, headache, or mood change.    Objective:   No results found. No results for input(s): WBC, HGB, HCT, PLT in the last 72 hours.  No results for input(s): NA, K, CL, CO2, GLUCOSE, BUN, CREATININE, CALCIUM in the last 72 hours.   Intake/Output Summary (Last 24 hours) at 02/27/2021 0914 Last data filed at 02/27/2021 0824 Gross per 24 hour  Intake 240 ml  Output 1925 ml  Net -1685 ml        Physical Exam: Vital Signs Blood pressure 116/82, pulse 63, temperature 98.2 F (36.8 C), resp. rate 14, height 5\' 8"  (1.727 m), weight 98.7 kg, SpO2 94 %.  Constitutional: No distress . Vital signs reviewed. HEENT: NCAT, EOMI, oral membranes moist Neck: supple Cardiovascular: RRR without murmur. No JVD    Respiratory/Chest: CTA Bilaterally without wheezes or rales. Normal effort    GI/Abdomen: BS +, non-tender, non-distended Ext: no clubbing, cyanosis, or edema Psych: pleasant and cooperative, sl flat  Skin: Clean and intact without signs of breakdown Neuro:  oriented to person, place, date and reason. Left central 7 and left HH, vision blurred, decreased lateral tracking. LUE with 1/5 delts biceps, 0/5 distally. LLE 1+ HF, KE and 0/5 distally. Senses pain on left. Sl neglect. RUE and RLE 4-5/5.  Musculoskeletal: Full ROM, No pain with AROM or PROM in the neck, trunk, or extremities.      Assessment/Plan: 1. Functional deficits which require 3+ hours per day of interdisciplinary therapy in a comprehensive inpatient rehab setting. Physiatrist is providing close team supervision and 24 hour management of active medical problems listed below. Physiatrist and rehab team continue to  assess barriers to discharge/monitor patient progress toward functional and medical goals  Care Tool:  Bathing    Body parts bathed by patient: Chest, Abdomen, Front perineal area, Buttocks, Right upper leg, Left upper leg, Face   Body parts bathed by helper: Right arm, Left arm     Bathing assist Assist Level: 2 Helpers     Upper Body Dressing/Undressing Upper body dressing   What is the patient wearing?: Pull over shirt    Upper body assist Assist Level: Maximal Assistance - Patient 25 - 49%    Lower Body Dressing/Undressing Lower body dressing      What is the patient wearing?: Pants     Lower body assist Assist for lower body dressing: Maximal Assistance - Patient 25 - 49%     Toileting Toileting    Toileting assist Assist for toileting: Independent with assistive device Assistive Device Comment: urinal   Transfers Chair/bed transfer  Transfers assist     Chair/bed transfer assist level: Moderate Assistance - Patient 50 - 74%     Locomotion Ambulation   Ambulation assist      Assist level: 2 helpers Assistive device:  (R hand rail) Max distance: 25'   Walk 10 feet activity   Assist     Assist level: 2 helpers Assistive device:  (  R hand rail)   Walk 50 feet activity   Assist Walk 50 feet with 2 turns activity did not occur: Safety/medical concerns         Walk 150 feet activity   Assist Walk 150 feet activity did not occur: Safety/medical concerns         Walk 10 feet on uneven surface  activity   Assist Walk 10 feet on uneven surfaces activity did not occur: Safety/medical concerns         Wheelchair     Assist Is the patient using a wheelchair?: Yes Type of Wheelchair: Manual    Wheelchair assist level: Dependent - Patient 0% Max wheelchair distance: 150'    Wheelchair 50 feet with 2 turns activity    Assist        Assist Level: Dependent - Patient 0%   Wheelchair 150 feet activity      Assist      Assist Level: Dependent - Patient 0%   Blood pressure 116/82, pulse 63, temperature 98.2 F (36.8 C), resp. rate 14, height 5\' 8"  (1.727 m), weight 98.7 kg, SpO2 94 %.  Medical Problem List and Plan: 1.  Left-sided weakness secondary to right PCA infarction secondary to terminal right ICA occlusion status post failed mechanical thrombectomy etiology likely secondary to cocaine use/vasospasm             -patient may  shower             -ELOS/Goals: 24-28 days with min assist to supervision goals  -Continue CIR therapies including PT, OT, and SLP. Interdisciplinary team conference today to discuss goals, barriers to discharge, and dc planning.     -grounds pass ordered 2.  Antithrombotics: -DVT/anticoagulation: Lower extremity Dopplers negative Pharmaceutical: Lovenox             -antiplatelet therapy: Aspirin 81 mg daily and Plavix 75 mg daily x90 days then aspirin alone 3. Pain Management: Tylenol as needed  -denies pain today 4. Mood/sleep: Provide emotional support  -scheduled trazodone at HS             -antipsychotic agents: N/A 5. Neuropsych: This patient is capable of making decisions on his own behalf. 6. Skin/Wound Care: Routine skin checks. Encourage appropriate nutrition 7. Fluids/Electrolytes/Nutrition: encourage PO  -good po intake 8.  Polysubstance abuse.  Provide counseling 9.  Hyperlipidemia.  Lipitor  10.  Obesity.  BMI 34.97.  Dietary follow-up 11. Dysphagia:  advanced to regular diet, tolerating    LOS: 5 days A FACE TO FACE EVALUATION WAS PERFORMED  02/27/2021, 9:14 AM

## 2021-02-27 NOTE — Progress Notes (Signed)
Physical Therapy Session Note  Patient Details  Name: David Craig MRN: [id number] Date of Birth: [birth year]  Today's Date: 02/27/2021 PT Individual Time: 0802-0859 and 4270-6237 PT Individual Time Calculation (min): 57 min and 27 min  Short Term Goals: Week 1:  PT Short Term Goal 1 (Week 1): Pt will perform bed mobility consistently with minA. PT Short Term Goal 2 (Week 1): Pt will perform sit to stand transfer consistently with minA. PT Short Term Goal 3 (Week 1): Pt will perform bed to chair transfer consistently with minA. PT Short Term Goal 4 (Week 1): Pt will ambulate x50' with modA +1 and LRAD.  Skilled Therapeutic Interventions/Progress Updates:     1st Session:  Pt received supine in bed and agrees to therapy. No complaint of pain. Supine to sit with modA and cues for sequencing and body mechanics. Seated at EOB, pt dons R shoe with setup assistance and requires maxA to don L shoe, though is able to lift L foot off the ground to assist. Pt performs stand pivot transfer to WC with modA. WC transport to gym for time management. Litegait training over treadmill performed:  Sit to stand with R upper extremity holding onto litegait handle with minA from PT and cues for midline positioning for neutral posture and even weight distribution. Pt sits and stands several times to don harness. Pt then steps up onto treadmill with modA and cues for step sequencing. Pt ambulates x2:10 at 0.5 mph for 77' with moderate bodyweight support. Mirror provided for visual feedback and PT provides maxA assistance for management of L leg, including progression during swing phase and blocking L knee during stance phase. During seated rest break, PT wraps pt's L foot with ace wrap for dorsiflexion and eversion assistance, and also ace wraps L hand for grip support on handrail. Pt completes additional 1:51 at 05-0. for 91'. Pt noted to be ambulating with trunk flexed and gluteals minimally engaged. PT provides  multimodal cueing for improved posture to facilitate optimal gait pattern, as well as reciprocal stepping to encourage L lateral weight shifting and increased WB through L lower extremity.  Pt verbalizes need for bowel movement. ModA to step down from treadmill and remove harness. WC transport to bathroom. Pt performs stand pivot transfer to toilet with grab bar and modA, with cues for initiation, sequencing, and positioning for safety. Pt requires additional cueing for safety due to attempting to stand up several times prior to being cued by PT. Pt performs pericare with minA. Stand pivot transfer from toilet>WC>bed with modA. Left supine with alarm intact and all needs within reach.  2nd Session: Pt received seated in recliner but had slid down so that buttocks was on leg rests. PT provides cues for hand placement and body mechanics and provides maxA to scoot up in chair for safety. Pt does not complain of pain. Stand pivot transfer to [facility] with modA. WC transport to gym for time management. Pt performs squat pivot from WC to mat to the L with modA. Sit to supine with minA and pt showing activation of L hip flexors. Pt performs side to side rolling from L<>R with multimodal cues for muscle activation and sequencing. Performed to engage L hemibody core muscles and work on functional bed mobility. Pt able to complete with supervision level of assistance. Following, pt performs supine bridging with PT assisting to stabilize L hip. Pt completes 3x10 with 10 count hold on final rep. For final set PT provides less assistance for stabilization  to force pt to engage L hip adductors. Supine to sit with modA and cues for sequencing. Squat pivot to WC with modA and cues for head hips relationship. Left seated in WC with alarm intact and all needs within reach.  Therapy Documentation Precautions:  Precautions Precautions: Fall Precaution Comments: L hemi, L homonymous hemianopsia, R gaze  preference Restrictions Weight Bearing Restrictions: No   Therapy/Group: Individual Therapy  Beau Fanny, PT, DPT 02/27/2021, 3:45 PM

## 2021-02-27 NOTE — Progress Notes (Signed)
Speech Language Pathology Daily Session Note  Patient Details  Name: David Craig MRN: [id number] Date of Birth: 10/07/1985  Today's Date: 02/27/2021 SLP Individual Time: 0930-1000 SLP Individual Time Calculation (min): 30 min  Short Term Goals: Week 1: SLP Short Term Goal 1 (Week 1): Patient will consume current diet with minimal overt s/s of aspiration with Min verbal cues for use of swallowing compensatory strategies. SLP Short Term Goal 2 (Week 1): Patient will utilize speech intelligibility strategies at the sentence level to improve articulatory precision with supervision level verbal cues. SLP Short Term Goal 3 (Week 1): Patient will demosntrate sustained attention to functional tasks for 15 minutes with Mod verbal cues for redirection. SLP Short Term Goal 4 (Week 1): Patient will demonstrate functional problem solving for basic and familiar tasks with Mod verbal cues. SLP Short Term Goal 5 (Week 1): Patient will utilize external memory aids to recall new, daily information with Mod verbal cues.  Skilled Therapeutic Interventions: Skilled treatment session focused on cognitive goals. Upon arrival, patient was asleep and required extra verbal cues to rouse. SLP facilitated session by providing overall Mod A verbal and visual cues for visual scanning during a calendar task. Patient also participated in a single stimuli visual scanning task in which patient missed 9/105 occurences all within the left and center visual fields. Patient left upright in bed with alarm on and all needs within reach. Continue with current plan of care.      Pain No/Denies Pain   Therapy/Group: Individual Therapy  Mamie Diiorio 02/27/2021, 4:12 PM

## 2021-02-27 NOTE — Patient Care Conference (Signed)
Inpatient RehabilitationTeam Conference and Plan of Care Update Date: 02/27/2021   Time: 10:35 AM   Patient Name: David Craig      Medical Record Number: [id number]  Date of Birth: 1985-07-21 Sex: Male         Room/Bed: 4W08C/4W08C-01 Payor Info: Payor: BLUE CROSS BLUE [employer]IELD / Plan: BCBS COMM PPO / Product Type: *No Product type* /    Admit Date/Time:  02/22/2021  3:23 PM  Primary Diagnosis:  Cerebrovascular accident (CVA) due to occlusion of right posterior cerebral artery [facility])  Hospital Problems: Principal Problem:   Cerebrovascular accident (CVA) due to occlusion of right posterior cerebral artery [facility])    Expected Discharge Date: Expected Discharge Date: 03/22/21  Team Members Present: Physician leading conference: Dr. Faith Rogue Social Worker Present: Cecile Sheerer, LCSWA Nurse Present: Kennyth Arnold, RN PT Present: Malachi Pro, PT OT Present: Kearney Hard, OT SLP Present: Feliberto Gottron, SLP PPS Coordinator present : Fae Pippin, SLP     Current Status/Progress Goal Weekly Team Focus  Bowel/Bladder   continent B/B, lbm 9/23  remain continent  work on consistent bowel movenments   Swallow/Nutrition/ Hydration   Regular textures with thin liquids, Min-Mod A for use of swallowing strategies  Mod I  use of swallow strategies   ADL's   Max A overall, poor attention and awareness,  lofty supervision, will likely downgrade ro min A  appropriateness with staff, transfers, L NMR, balance, NMES, awareness, t   Mobility   modA bed mobility and stand pivot transfers, maxA squat pivot transfers, modA gait x8' in parallel bars  CGA  appropriate interactions with therapy, safety awareness, L hemibody NMR   Communication   Supervision  Mod I  use of speech intelligibility strategies   Safety/Cognition/ Behavioral Observations  Min-Mod A  Supervision  sustained attention, functional problem solving, emergent awareness, recall   Pain   None reported, Tylenol  available  < 3  assess pain q 4 hr and prn   Skin   CDI  no new breakdown  assess skin q shift and prn     Discharge Planning:  Pt to discharge to home with intermittent support from his wife who works from home and cares for their 1 and 3 y.o. sons. PRN support from family that live close to their home.   Team Discussion: Right PCA infarct, medically stable. Continent B/B, LBM 02/27/21. No pain reported. Trazodone for sleep, skin is CDI. Nursing educating on medications and secondary stroke prevention. Sexually inappropriate with male staff. Cousin took him outside to smoke over the weekend. Has a good support system at home. Using male staff for bathing and dressing. Sexually inappropriate with male therapists. Mod/max assist with PT. Needs to listen to staff for mobility and safety. Wife aware of inappropriate behaviors. SLP awareness and appropriate behaviors. Lethargic and always wants to go back to bed.  Patient on target to meet rehab goals: yes  *See Care Plan and progress notes for long and short-term goals.   Revisions to Treatment Plan:  Possible implementation of behavior plan.  Teaching Needs: Family education, medication management, sleep management, tobacco and drug education, transfer training, gait training, balance training, endurance training, safety awareness, behavior management.  Current Barriers to Discharge: Decreased caregiver support, Medical stability, Home enviroment access/layout, Lack of/limited family support, Weight, Medication compliance, Behavior, and tobacco/drugs.  Possible Resolutions to Barriers: Continue current medications, behavior education with family, tobacco/drug abuse education, provide emotional support.     Medical Summary Current Status: right PCA  infarct with left hp, hemianopsia. polysubstance abuse  Barriers to Discharge: Medical stability   Possible Resolutions to [employer] Focus: daily assessment of pt labs and data.  education about drug/tobacco abuse   Continued Need for Acute Rehabilitation Level of Care: The patient requires daily medical management by a physician with specialized training in physical medicine and rehabilitation for the following reasons: Direction of a multidisciplinary physical rehabilitation program to maximize functional independence : Yes Medical management of patient stability for increased activity during participation in an intensive rehabilitation regime.: Yes Analysis of laboratory values and/or radiology reports with any subsequent need for medication adjustment and/or medical intervention. : Yes   I attest that I was present, lead the team conference, and concur with the assessment and plan of the team.   Tennis Must 02/27/2021, 2:11 PM

## 2021-02-27 NOTE — Progress Notes (Signed)
Patient ID: David Craig, male   DOB: December 26, 1985, 35 y.o.   MRN: [id number]  SW met with pt and pt wife in room to provide updates from team conference, and d/c date 10/20. SW informed will continue to provide updates after team conference, and family education will be scheduled closer towards d/c.   Loralee Pacas, MSW, [place] Office: [phone] Cell: [phone] Fax: [phone]

## 2021-02-27 NOTE — Progress Notes (Signed)
Occupational Therapy Session Note  Patient Details  Name: David Craig MRN: [id number] Date of Birth: [birth year]  Today's Date: 02/27/2021 OT Individual Time: 1300-1400 OT Individual Time Calculation (min): 60 min    Short Term Goals: Week 1:  OT Short Term Goal 1 (Week 1): Pt will complete BSC or toilet transfer with Max A without Stedy OT Short Term Goal 2 (Week 1): Pt will complete LB dressing with no more than Mod balance assistance in standing OT Short Term Goal 3 (Week 1): Pt will complete UB dressing with Mod A using hemi techniques  Skilled Therapeutic Interventions/Progress Updates:    Pt resting in bed upon arrival with wife present. Pt's wife requested pt have a shower this afternoon. Pt leaning over in bed towards Rt side but able to reposition self when requested. Supine>sit EOB with min A. Squat pivot transfer to w/c with mod A. W/c>TTB with min A using grab bars. Pt mod A overall for bathing at shower level. Pt kept pulling call bell cord after requested to leave alone. Pt's wife assisted with bathing. Pt perseverated on washing Rt side of body and turned water back on after it had been turned off. Pt stated that he just wanted to stay in the shower all day.  UB dressing with mod A and LB dressing with max A. Sit<>stand from w/c with min A and Rt knee blocked. Pt c/o that everything was too hard and appeared angry about his situation. Pt agreed that he liked talking junk. Discussed importance of showing respect to staff/therapists and affect on his improvement. Pt verbalized understanding. Pt with R gaze preference throughout session. Pt transferred to recliner. Belt alarm activated. All needs within reach. Wife present.   Therapy Documentation Precautions:  Precautions Precautions: Fall Precaution Comments: L hemi, L homonymous hemianopsia, R gaze preference Restrictions Weight Bearing Restrictions: No   Pain:  Pt denies pain this afternoon    Therapy/Group: Individual  Therapy  Rich Brave 02/27/2021, 2:45 PM

## 2021-02-28 NOTE — Progress Notes (Signed)
Inpatient Rehabilitation Care Coordinator Assessment and Plan Patient Details  Name: David Craig MRN: [id number] Date of Birth: 1986/01/24  Today's Date: 02/28/2021  Hospital Problems: Principal Problem:   Cerebrovascular accident (CVA) due to occlusion of right posterior cerebral artery [facility])  Past Medical History: History reviewed. No pertinent past medical history. Past Surgical History:  Past Surgical History:  Procedure Laterality Date   BUBBLE STUDY  02/21/2021   Procedure: BUBBLE STUDY;  Surgeon: Skeet Latch, MD;  Location: [place];  Service: Cardiovascular;;   IR ANGIO INTRA EXTRACRAN SEL COM CAROTID INNOMINATE UNI L MOD SED  02/15/2021   IR ANGIO VERTEBRAL SEL VERTEBRAL BILAT MOD SED  02/15/2021   IR CT HEAD LTD  02/15/2021   IR PERCUTANEOUS ART THROMBECTOMY/INFUSION INTRACRANIAL INC DIAG ANGIO  02/15/2021   IR US GUIDE VASC ACCESS RIGHT  02/15/2021   RADIOLOGY WITH ANESTHESIA N/David 02/14/2021   Procedure: IR WITH ANESTHESIA;  Surgeon: Luanne Bras, MD;  Location: [place];  Service: Radiology;  Laterality: N/David;   TEE WITHOUT CARDIOVERSION N/David 02/21/2021   Procedure: TRANSESOPHAGEAL ECHOCARDIOGRAM (TEE);  Surgeon: Skeet Latch, MD;  Location: [place];  Service: Cardiovascular;  Laterality: N/David;   Social History:  reports that he quit smoking about 5 years ago. He has never used smokeless tobacco. He reports current alcohol use. He reports current drug use. Drugs: Cocaine, Marijuana, and Oxycodone.  Family / Support Systems Marital Status: Married How Long?: 4 years Patient Roles: Spouse, Parent Spouse/Significant Other: David Craig (wife): [phone] Children: Two sons- 16 and 62 y.o. Other Supports: parents and his brother Anticipated Caregiver: Wife Ability/Limitations of Caregiver: Wife works from home and cares for their children. Caregiver Availability: 24/7 Family Dynamics: Pt lives with his wife and two children.  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 Education: high school grad Health Literacy - How often do you need to have someone help you when you read instructions, pamphlets, or other written material from your doctor or pharmacy?: Never Writes: Yes Employment Status: Employed Name of Employer: [employer] Length of Employment: 5 (months) Return to Work Plans: TBD. Patient's wife reports that she has been working with his HR Dept and will be eligible for STD. Legal History/Current Legal Issues: Denies Guardian/Conservator: N/David   Abuse/Neglect Abuse/Neglect Assessment Can Be Completed: Unable to assess, patient is non-responsive or altered mental status (Pt wife had to assist with filling in answers for patient, as pt has intermittent confusion) Physical Abuse: Denies Verbal Abuse: Denies Sexual Abuse: Denies Exploitation of patient/patient's resources: Denies Self-Neglect: Denies  Patient response to: Social Isolation - How often do you feel lonely or isolated from those around you?: Never  Emotional Status Pt's affect, behavior and adjustment status: Pt in good spirits at time of visit. Recent Psychosocial Issues: Denies Psychiatric History: Denies Substance Abuse History: Pt was unsure on amount of cigarettes he smokes as he was not accurate in his report. Wife reported pt smokes 1/2 ppd. Pt denies etoh. Pt admits to daily marijuana use and atleast 1 joint per day. Per EMR, UDS+positive for cocaine.  Patient / Family Perceptions, Expectations & Goals Pt/Family understanding of illness & functional limitations: Pt wife and family has David general understanding of pt care needs Premorbid pt/family roles/activities: Independent Anticipated changes in roles/activities/participation: Assistance with ADLs/IADLs Pt/family expectations/goals: Pt goal is "being able to walk and using left side and arm and being able  to pick up kids since they run and jump on me."  [employer]: None Premorbid Home Care/DME Agencies: None Transportation available at discharge: TBD Is the patient able to respond to transportation needs?: Yes In the past 12 months, has lack of transportation kept you from medical appointments or from getting medications?: No In the past 12 months, has lack of transportation kept you from meetings, work, or from getting things needed for daily living?: No Resource referrals recommended: Neuropsychology  Discharge Planning Living Arrangements: Spouse/significant other, Children Support Systems: Spouse/significant other, Children, Other relatives, Parent Type of Residence: Private residence Insurance Resources: Multimedia programmer (specify) Nurse, mental health) Financial Resources: Employment, Secondary school teacher Screen Referred: No Living Expenses: Medical laboratory scientific officer Management: Spouse Does the patient have any problems obtaining your medications?: No Home Management: Pt managed all yardwork and animals (3 chickens, dog and rabbit); wife managed all cooking/cleaning in the home. Patient/Family Preliminary Plans: TBD Care Coordinator Barriers to Discharge: Decreased caregiver support, Lack of/limited family support Care Coordinator Anticipated Follow Up Needs: HH/OP  Clinical Impression SW met with pt and pt wife in room to complete assessment. No HCPOA. Pt is not David English as David second language teacher.No DME.   David Craig David Craig 02/28/2021, 10:25 AM

## 2021-02-28 NOTE — Progress Notes (Signed)
PROGRESS NOTE   Subjective/Complaints: Resting in bed. Says he slept well without any issues.   ROS: Patient denies fever, rash, sore throat, blurred vision, nausea, vomiting, diarrhea, cough, shortness of breath or chest pain, joint or back pain, headache, or mood change.    Objective:   No results found. No results for input(s): WBC, HGB, HCT, PLT in the last 72 hours.  No results for input(s): NA, K, CL, CO2, GLUCOSE, BUN, CREATININE, CALCIUM in the last 72 hours.   Intake/Output Summary (Last 24 hours) at 02/28/2021 0810 Last data filed at 02/27/2021 2230 Gross per 24 hour  Intake 240 ml  Output 500 ml  Net -260 ml        Physical Exam: Vital Signs Blood pressure 133/80, pulse 68, temperature 98.6 F (37 C), temperature source Oral, resp. rate 18, height 5\' 8"  (1.727 m), weight 98.7 kg, SpO2 98 %.  Constitutional: No distress . Vital signs reviewed. HEENT: NCAT, EOMI, oral membranes moist Neck: supple Cardiovascular: RRR without murmur. No JVD    Respiratory/Chest: CTA Bilaterally without wheezes or rales. Normal effort    GI/Abdomen: BS +, non-tender, non-distended Ext: no clubbing, cyanosis, or edema Psych: cooperative, generally appropriate.  Skin: Clean and intact without signs of breakdown Neuro:  oriented to person, place, date and reason. Left central 7 and left HH, vision blurred, decreased lateral tracking. LUE with 1/5 delts biceps, 0/5 distally. LLE 1+ HF, KE and 0/5 distally. Senses pain on left. Ongoing Sl neglect. RUE and RLE 4-5/5.  Musculoskeletal: Full ROM, No pain with AROM or PROM in the neck, trunk, or extremities.      Assessment/Plan: 1. Functional deficits which require 3+ hours per day of interdisciplinary therapy in a comprehensive inpatient rehab setting. Physiatrist is providing close team supervision and 24 hour management of active medical problems listed below. Physiatrist and  rehab team continue to assess barriers to discharge/monitor patient progress toward functional and medical goals  Care Tool:  Bathing    Body parts bathed by patient: Left arm, Chest, Abdomen, Front perineal area, Right upper leg, Left upper leg, Face   Body parts bathed by helper: Right arm, Buttocks, Right lower leg, Left lower leg     Bathing assist Assist Level: Moderate Assistance - Patient 50 - 74%     Upper Body Dressing/Undressing Upper body dressing   What is the patient wearing?: Pull over shirt    Upper body assist Assist Level: Moderate Assistance - Patient 50 - 74%    Lower Body Dressing/Undressing Lower body dressing      What is the patient wearing?: Underwear/pull up, Pants     Lower body assist Assist for lower body dressing: Maximal Assistance - Patient 25 - 49%     Toileting Toileting    Toileting assist Assist for toileting: Independent with assistive device Assistive Device Comment: urinal   Transfers Chair/bed transfer  Transfers assist     Chair/bed transfer assist level: Moderate Assistance - Patient 50 - 74%     Locomotion Ambulation   Ambulation assist      Assist level: 2 helpers Assistive device:  (R hand rail) Max distance: 25'   Walk 10  feet activity   Assist     Assist level: 2 helpers Assistive device:  (R hand rail)   Walk 50 feet activity   Assist Walk 50 feet with 2 turns activity did not occur: Safety/medical concerns         Walk 150 feet activity   Assist Walk 150 feet activity did not occur: Safety/medical concerns         Walk 10 feet on uneven surface  activity   Assist Walk 10 feet on uneven surfaces activity did not occur: Safety/medical concerns         Wheelchair     Assist Is the patient using a wheelchair?: Yes Type of Wheelchair: Manual    Wheelchair assist level: Dependent - Patient 0% Max wheelchair distance: 150'    Wheelchair 50 feet with 2 turns  activity    Assist        Assist Level: Dependent - Patient 0%   Wheelchair 150 feet activity     Assist      Assist Level: Dependent - Patient 0%   Blood pressure 133/80, pulse 68, temperature 98.6 F (37 C), temperature source Oral, resp. rate 18, height 5\' 8"  (1.727 m), weight 98.7 kg, SpO2 98 %.  Medical Problem List and Plan: 1.  Left-sided weakness secondary to right PCA infarction secondary to terminal right ICA occlusion status post failed mechanical thrombectomy etiology likely secondary to cocaine use/vasospasm             -patient may  shower             -ELOS/Goals: 24-28 days with min assist to supervision goals  -Continue CIR therapies including PT, OT, and SLP    -grounds pass ordered  -discussed vitamin supplements and stroke recovery yesterday with pt and wife. Resumed some vitamins he's taking at home.  2.  Antithrombotics: -DVT/anticoagulation: Lower extremity Dopplers negative Pharmaceutical: Lovenox             -antiplatelet therapy: Aspirin 81 mg daily and Plavix 75 mg daily x90 days then aspirin alone 3. Pain Management: Tylenol as needed  -denies pain today 4. Mood/sleep: Provide emotional support  -scheduled trazodone at HS             -antipsychotic agents: N/A 5. Neuropsych: This patient is capable of making decisions on his own behalf. 6. Skin/Wound Care: Routine skin checks. Encourage appropriate nutrition 7. Fluids/Electrolytes/Nutrition: encourage PO  -eating well 8.  Polysubstance abuse.  Provide counseling 9.  Hyperlipidemia.  Lipitor  10.  Obesity.  BMI 34.97.  Dietary follow-up 11. Dysphagia:  advanced to regular diet, tolerating    LOS: 6 days A FACE TO FACE EVALUATION WAS PERFORMED  02/28/2021, 8:10 AM

## 2021-02-28 NOTE — Progress Notes (Signed)
Occupational Therapy Session Note  Patient Details  Name: Rilen Shukla MRN: [id number] Date of Birth: 1986-02-23  Today's Date: 02/28/2021 OT Individual Time: 4008-6761 OT Individual Time Calculation (min): 59 min    Short Term Goals: Week 1:  OT Short Term Goal 1 (Week 1): Pt will complete BSC or toilet transfer with Max A without Stedy OT Short Term Goal 2 (Week 1): Pt will complete LB dressing with no more than Mod balance assistance in standing OT Short Term Goal 3 (Week 1): Pt will complete UB dressing with Mod A using hemi techniques  Skilled Therapeutic Interventions/Progress Updates:    Pt in bed asleep to start, he was able to arouse easily to verbal command.  He transferred supine to sit with mod assist and then completed squat pivot transfer to the wheelchair at the same level.  Took him down to the therapy gym where he transferred to the mat at the same level with transition to supine.  He was able to complete rolling to the right with mod assist for initiation of left arm/shoulder with min assist for positioning of the LLE.  Transitioned to having him work on bilateral shoulder flexion in supine with use of the hula hoop and ace bandage applied to the left hand to assist with maintaining grip.  Mod to max assist to maintain shoulder flexion and elbow extension on the left for simple small range repetitions.  He was able to complete transition to sitting where he worked on LUE weightbearing, working on maintaining the LUE on the mat while completing left leans and while reaching to target on the left side with the RUE.  Mod assist to maintain  LUE extension.  Progressed to working on pushing tilted stool to target.  Noted increased internal rotation when attempting to push the stool.  Pt with frequent yawning as well which results in increased extensor tone in the elbow as well as increase tone in the internal rotators, digit extensors, and wrist flexors.  Left visual field cut still  present as well.  He transferred to the wheelchair stand pivot at [facility] assist with return to the room.  Call button and phone in reach with pt's father in the room at end of session.  Safety alarm in place with NT in to supervise for self feeding.    Therapy Documentation Precautions:  Precautions Precautions: Fall Precaution Comments: L hemi, L homonymous hemianopsia, R gaze preference Restrictions Weight Bearing Restrictions: No   Pain: Pain Assessment Pain Scale: Faces Pain Score: 0-No pain ADL: See Care Tool Section for some details of mobility and selfcare   Therapy/Group: Individual Therapy  Tamberlyn Midgley OTR/L 02/28/2021, 10:37 AM

## 2021-02-28 NOTE — Progress Notes (Signed)
Physical Therapy TBI Note  Patient Details  Name: David Craig MRN: [id number] Date of Birth: 22-Dec-1985  Today's Date: 02/28/2021 PT Individual Time: 1105-1150 and 1300-1345 PT Individual Time Calculation (min): 45 min and 45 min  Short Term Goals: Week 1:  PT Short Term Goal 1 (Week 1): Pt will perform bed mobility consistently with minA. PT Short Term Goal 2 (Week 1): Pt will perform sit to stand transfer consistently with minA. PT Short Term Goal 3 (Week 1): Pt will perform bed to chair transfer consistently with minA. PT Short Term Goal 4 (Week 1): Pt will ambulate x50' with modA +1 and LRAD.  Skilled Therapeutic Interventions/Progress Updates:     Pt received seated in [facility] with speech therapy present. No complaint of pain. Pt had just received food from cousins and requesting to eat it prior to therapy. PT present as pt eats food, very quickly, despite multiple cues to slow down intake. Pt does not follow PT cues and eats food as quickly as possible, with very large bites. Following, pt maneuvers WC to sink with modA from PT and washes hands with cueing to attend to L hand for sequencing and L sided attention. PT requesting that pt leave room for therapy but pt insists on returning to bed at this time, saying that he needs a nap. Pt pulling his WC toward bed and attempting to exit WC without assistance. PT cues for safety and assists pt with safe positioning of WC to initiate transfer. Pt performs stand pivot transfer to the R with modA/maxA and cues for body mechanics. Sit to supine with minA and cues for use of R hand on bedrails and R leg for bridging to scoot up in bed. Pt's mother arrives and has many questions for PT regarding pt's behavior and how best to interact with pt limit inappropriate behaviors and contribute to maximizing therapeutic interventions. Following, PT also has discussion with pt's wife via telephone explaining why pt is missing therapy time and expectations for pt  management going forward. Wife verbalizes understanding. Pt left supine in bed with alarm intact and all needs within reach. Pt misses 15 minutes of skilled PT due to fatigue and refusal to participate.  2nd Session: Pt received supine in bed asleep. Easily roused and agrees to therapy. No complaint of pain. Supine to sit with modA and cues for body mechanics. ModA for stand pivot transfer to [facility] with multimodal cues for hand placement and sequencing. WC transport to gym for time management. Squat pivot to mat with modA and cues for head hips relationship and initiation. Pt performs sit to stand from mat with modA and participates in NMR for L hemibody re-ed and balance. Pt performs targeted toe taps with R lower extremity with PT blocking L knee and facilitating L lateral weight shifts. Pt able to engage L knee extensors but does periodically buckle. Activity progressed to performing with 2 inch step. Pt takes multiple seated rest breaks and cued to attempt knee extension in sitting. Initially pt is unable but with PT providing tactile cueing and optimal length-tension ratio, pt is able to extend knee through partial ROM against gravity.   Pt ambulates x5' back to [facility] with modA and cues for weight shifting and foot placement. Pt left in [facility] initially with alarm in place. Before PT leaves room pt seen removing belt and lunging for bed prior to PT being able to prevent attempt. Luckily, pt safely transfers to bed. PT provides education to pt and pt's  mother on safety and need to adhere to staff  recommendations.  Pt left supine in bed with alarm intact and all needs within reach.  Therapy Documentation Precautions:  Precautions Precautions: Fall Precaution Comments: L hemi, L homonymous hemianopsia, R gaze preference Restrictions Weight Bearing Restrictions: No  Therapy/Group: Individual Therapy  Beau Fanny, PT, DPT 02/28/2021, 4:21 PM

## 2021-02-28 NOTE — Progress Notes (Signed)
Speech Language Pathology Daily Session Note  Patient Details  Name: David Craig MRN: [id number] Date of Birth: 1985/08/26  Today's Date: 02/28/2021 SLP Individual Time: 1030-1100 SLP Individual Time Calculation (min): 30 min  Short Term Goals: Week 1: SLP Short Term Goal 1 (Week 1): Patient will consume current diet with minimal overt s/s of aspiration with Min verbal cues for use of swallowing compensatory strategies. SLP Short Term Goal 2 (Week 1): Patient will utilize speech intelligibility strategies at the sentence level to improve articulatory precision with supervision level verbal cues. SLP Short Term Goal 3 (Week 1): Patient will demosntrate sustained attention to functional tasks for 15 minutes with Mod verbal cues for redirection. SLP Short Term Goal 4 (Week 1): Patient will demonstrate functional problem solving for basic and familiar tasks with Mod verbal cues. SLP Short Term Goal 5 (Week 1): Patient will utilize external memory aids to recall new, daily information with Mod verbal cues.  Skilled Therapeutic Interventions: Skilled treatment session focused on cognitive goals. Upon arrival, patient was asleep in bed and required encouragement and Max cues to sit EOB. SLP transferred patient to the wheelchair with +2 for safety and overall Mod verbal cues to reduce impulsivity. Patient participated in a mildly complex medication error task that focused on attention, scanning and problem solving. Overall, patient required Mod-Max A verbal cues to complete task due to impulsivity. Patient also perseverative on awaiting breakfast that family had ordered from [employer]. Patient handed off to PT. Continue with current plan of care.      Pain Pain Assessment Pain Scale: Faces Pain Score: 0-No pain  Therapy/Group: Individual Therapy  David Craig 02/28/2021, 11:28 AM

## 2021-03-01 MED ORDER — NICOTINE 14 MG/24HR TD PT24
14.0000 mg | MEDICATED_PATCH | Freq: Every day | TRANSDERMAL | Status: DC
  Administered 2021-03-01 – 2021-03-15 (×12): 14 mg via TRANSDERMAL
  Filled 2021-03-01 (×22): qty 1

## 2021-03-01 MED ORDER — AMANTADINE HCL 100 MG PO CAPS
100.0000 mg | ORAL_CAPSULE | Freq: Every day | ORAL | Status: DC
  Administered 2021-03-01 – 2021-03-05 (×5): 100 mg via ORAL
  Filled 2021-03-01 (×5): qty 1

## 2021-03-01 NOTE — Progress Notes (Signed)
Physical Therapy Session Note  Patient Details  Name: David Craig MRN: [id number] Date of Birth: [birth year]  Today's Date: 03/01/2021 PT Individual Time: 1705-1735 PT Individual Time Calculation (min): 30 min   Short Term Goals: Week 1:  PT Short Term Goal 1 (Week 1): Pt will perform bed mobility consistently with minA. PT Short Term Goal 2 (Week 1): Pt will perform sit to stand transfer consistently with minA. PT Short Term Goal 3 (Week 1): Pt will perform bed to chair transfer consistently with minA. PT Short Term Goal 4 (Week 1): Pt will ambulate x50' with modA +1 and LRAD.   Skilled Therapeutic Interventions/Progress Updates:   Pt received supine in bed and agreeable to PT. Supine>sit transfer with mod assist at trunk. Sqaut pivot transfer to and from [facility] with min assist overall and cues for safety to reduce impulsivity.    Nustep recicprocal movement training x 8 min with BUE/BLE x 1 min then pt reporting pain at the LUE at [facility] joint. Completed last 7 minutes with BLE only and mod assist for improved hip control on the L as well improved symmetry of movement.   BITS visual scanning completed 2 x 2 min with min cues for cues for visual scanning to the L.   Patient returned to room and left sitting in [facility] with call bell in reach and all needs met.          Therapy Documentation Precautions:  Precautions Precautions: Fall Precaution Comments: L hemi, L homonymous hemianopsia, R gaze preference Restrictions Weight Bearing Restrictions: No  Pain: denies    Therapy/Group: Individual Therapy  [name] 03/01/2021, 5:56 PM

## 2021-03-01 NOTE — Progress Notes (Signed)
Occupational Therapy Session Note  Patient Details  Name: David Craig MRN: [id number] Date of Birth: 1985/12/14  Today's Date: 03/01/2021 OT Individual Time: 1021-1173 OT Individual Time Calculation (min): 59 min    Short Term Goals: Week 1:  OT Short Term Goal 1 (Week 1): Pt will complete BSC or toilet transfer with Max A without Stedy OT Short Term Goal 2 (Week 1): Pt will complete LB dressing with no more than Mod balance assistance in standing OT Short Term Goal 3 (Week 1): Pt will complete UB dressing with Mod A using hemi techniques  Skilled Therapeutic Interventions/Progress Updates:    Pt worked on bathing and dressing sit to stand at shower level.  Mod assist for transfer from supine to sit and for transfer squat pivot to the wheelchair and stand pivot with use of the grab bars to the tub bench.  Mod assist for removal of clothing sit to stand.  He was able to complete bathing sit to stand with min assist for UB and then LB with mod assist.  Max hand over hand assist for use of the LUE for washing the RUE as well as for stabilization of soap to be opened.  Mod instructional cueing for sequencing through bathing for thoroughness.  He was able to transfer out of the shower with mod assist squat pivot to the wheelchair in order to complete dressing at the sink.  He needed mod instructional cueing for orientation of clothing and for hemi dressing techniques as he oriented clothing backwards to start without awareness.  Secondary to impulsiveness, when told they were backwards, he continued trying to put them on.  He was able to donn his pullover shirt with supervision.  Mod assist for sit to stand as well when pulling underpants and pants over his hips.  Finished with work on Manufacturing systems engineer socks with mod demonstrational cueing and min assist.  TEDs were completed with total assist.  He was left with his spouse in the room and with the call button and phone in reach and safety belt in place.     Therapy Documentation Precautions:  Precautions Precautions: Fall Precaution Comments: L hemi, L homonymous hemianopsia, R gaze preference Restrictions Weight Bearing Restrictions: No  Pain:  No report of pain ADL: See Care Tool Section for some details of mobility and selfcare   Therapy/Group: Individual Therapy  Tameko Halder OTR/L 03/01/2021, 12:42 PM

## 2021-03-01 NOTE — Progress Notes (Signed)
Physical Therapy Session Note  Patient Details  Name: David Craig MRN: [id number] Date of Birth: 12-Apr-1986  Today's Date: 03/01/2021 PT Individual Time: 1405-1500 PT Individual Time Calculation (min): 55 min   Short Term Goals: Week 1:  PT Short Term Goal 1 (Week 1): Pt will perform bed mobility consistently with minA. PT Short Term Goal 2 (Week 1): Pt will perform sit to stand transfer consistently with minA. PT Short Term Goal 3 (Week 1): Pt will perform bed to chair transfer consistently with minA. PT Short Term Goal 4 (Week 1): Pt will ambulate x50' with modA +1 and LRAD.  Skilled Therapeutic Interventions/Progress Updates:   Pt received supine in bed and agreeable to PT. Supine>sit transfer with mod assist at trunk. Squat pivot transfer to [facility] with min assist. Pt performed WC mobility with min-mod assist and and hemi technique x 130f and max cues for technique to prevent veer to the L.   Sit<>stand in parallel bars and and at RW with min assist and tactile  cues for improved activation of the LLE.   Pre-gait stepping with BUE supported on parallel bar rail 2 x 8 with BLE and ace wrapt applied to the LLE to improve DF.   Gait training in parallel bars forward/reverse with mod assist overall and max mutilmodal cues for sequencing. Gait training then performed with RW x 226fwith DF wrap and Mod A with +2 for WC follow.   Kinetron BLE reciprocal movement training 3 min x 3 with therapeutic rest break between bouts.   Pt returned to room and performed squat pivot transfer to bed with min assist to block the LLE. Sit>supine completed with min assist to the LLE. PT instruction pt in supine NMR: hip/knee flexion/extension in synergy and D2 PNF pattern x 8 BLE. Bridges with LLE stabilized . Lumbar rotation x 5 AROM and x 5 with manual resistance. AAROM SLR x 8 BLE.    Pt left supine in bed with call bell in reach and all needs met.       Therapy Documentation Precautions:   Precautions Precautions: Fall Precaution Comments: L hemi, L homonymous hemianopsia, R gaze preference Restrictions Weight Bearing Restrictions: No   Pain:  denies    Therapy/Group: Individual Therapy  AuLorie Phenix/29/2022, 3:10 PM

## 2021-03-01 NOTE — Plan of Care (Signed)
  Problem: Consults Goal: RH STROKE PATIENT EDUCATION Description: See Patient Education module for education specifics  Outcome: Progressing   Problem: RH SAFETY Goal: RH STG ADHERE TO SAFETY PRECAUTIONS W/ASSISTANCE/DEVICE Description: STG Adhere to Safety Precautions With Min Assistance/Device. Outcome: Progressing   Problem: RH COGNITION-NURSING Goal: RH STG ANTICIPATES NEEDS/CALLS FOR ASSIST W/ASSIST/CUES Description: STG Anticipates Needs/Calls for Assist With Reminders and Cues. Outcome: Progressing

## 2021-03-01 NOTE — Progress Notes (Signed)
PROGRESS NOTE   Subjective/Complaints: No new complaints. Sleeping well but still feeling tired during the day  ROS: Patient denies fever, rash, sore throat, blurred vision, nausea, vomiting, diarrhea, cough, shortness of breath or chest pain, joint or back pain, headache, or mood change.    Objective:   No results found. No results for input(s): WBC, HGB, HCT, PLT in the last 72 hours.  No results for input(s): NA, K, CL, CO2, GLUCOSE, BUN, CREATININE, CALCIUM in the last 72 hours.  No intake or output data in the 24 hours ending 03/01/21 0914       Physical Exam: Vital Signs Blood pressure 118/84, pulse (!) 55, temperature 98.7 F (37.1 C), resp. rate 18, height 5\' 8"  (1.727 m), weight 98.7 kg, SpO2 97 %.  Constitutional: No distress . Vital signs reviewed. HEENT: NCAT, EOMI, oral membranes moist Neck: supple Cardiovascular: RRR without murmur. No JVD    Respiratory/Chest: CTA Bilaterally without wheezes or rales. Normal effort    GI/Abdomen: BS +, non-tender, non-distended Ext: no clubbing, cyanosis, or edema Psych: pleasant and generally appropriate  Skin: Clean and intact without signs of breakdown Neuro: alert,  oriented to person, place, date and reason. Left central 7 and left HH. LUE with 1/5 delts biceps, 0/5 distally. LLE 1+ HF, KE and 0/5 distally. Senses pain on left. Ongoing Sl neglect. RUE and RLE 4-5/5. No resting tone appreciated Musculoskeletal: Full ROM, No pain with AROM or PROM in the neck, trunk, or extremities.      Assessment/Plan: 1. Functional deficits which require 3+ hours per day of interdisciplinary therapy in a comprehensive inpatient rehab setting. Physiatrist is providing close team supervision and 24 hour management of active medical problems listed below. Physiatrist and rehab team continue to assess barriers to discharge/monitor patient progress toward functional and medical  goals  Care Tool:  Bathing    Body parts bathed by patient: Left arm, Chest, Abdomen, Front perineal area, Right upper leg, Left upper leg, Face   Body parts bathed by helper: Right arm, Buttocks, Right lower leg, Left lower leg     Bathing assist Assist Level: Moderate Assistance - Patient 50 - 74%     Upper Body Dressing/Undressing Upper body dressing   What is the patient wearing?: Pull over shirt    Upper body assist Assist Level: Moderate Assistance - Patient 50 - 74%    Lower Body Dressing/Undressing Lower body dressing      What is the patient wearing?: Underwear/pull up, Pants     Lower body assist Assist for lower body dressing: Maximal Assistance - Patient 25 - 49%     Toileting Toileting    Toileting assist Assist for toileting: Independent with assistive device Assistive Device Comment: urinal   Transfers Chair/bed transfer  Transfers assist     Chair/bed transfer assist level: Moderate Assistance - Patient 50 - 74%     Locomotion Ambulation   Ambulation assist      Assist level: 2 helpers Assistive device:  (R hand rail) Max distance: 25'   Walk 10 feet activity   Assist     Assist level: 2 helpers Assistive device:  (R hand rail)  Walk 50 feet activity   Assist Walk 50 feet with 2 turns activity did not occur: Safety/medical concerns         Walk 150 feet activity   Assist Walk 150 feet activity did not occur: Safety/medical concerns         Walk 10 feet on uneven surface  activity   Assist Walk 10 feet on uneven surfaces activity did not occur: Safety/medical concerns         Wheelchair     Assist Is the patient using a wheelchair?: Yes Type of Wheelchair: Manual    Wheelchair assist level: Dependent - Patient 0% Max wheelchair distance: 150'    Wheelchair 50 feet with 2 turns activity    Assist        Assist Level: Dependent - Patient 0%   Wheelchair 150 feet activity      Assist      Assist Level: Dependent - Patient 0%   Blood pressure 118/84, pulse (!) 55, temperature 98.7 F (37.1 C), resp. rate 18, height 5\' 8"  (1.727 m), weight 98.7 kg, SpO2 97 %.  Medical Problem List and Plan: 1.  Left-sided weakness secondary to right PCA infarction secondary to terminal right ICA occlusion status post failed mechanical thrombectomy etiology likely secondary to cocaine use/vasospasm             -patient may  shower             -ELOS/Goals: 24-28 days with min assist to supervision goals  -Continue CIR therapies including PT, OT, and SLP     -grounds pass PRN    2.  Antithrombotics: -DVT/anticoagulation: Lower extremity Dopplers negative Pharmaceutical: Lovenox             -antiplatelet therapy: Aspirin 81 mg daily and Plavix 75 mg daily x90 days then aspirin alone 3. Pain Management: Tylenol as needed  -denies pain today 4. Mood/sleep-wake: Provide emotional support  -scheduled seroquel at HS             -antipsychotic agents: seroquel, to help with irritability also  -9/29 add amantadine to help with daytime fatigue, 100mg  qd   -add nicotine patch for cigarette craving 5. Neuropsych: This patient is capable of making decisions on his own behalf. 6. Skin/Wound Care: Routine skin checks. Encourage appropriate nutrition 7. Fluids/Electrolytes/Nutrition: encourage PO  -eating well 8.  Polysubstance abuse.  Provide counseling 9.  Hyperlipidemia.  Lipitor  10.  Obesity.  BMI 34.97.  Dietary follow-up 11. Dysphagia:  advanced to regular diet, tolerating    LOS: 7 days A FACE TO FACE EVALUATION WAS PERFORMED  10/29 03/01/2021, 9:14 AM

## 2021-03-01 NOTE — Progress Notes (Signed)
Speech Language Pathology Daily Session Note  Patient Details  Name: David Craig MRN: [id number] Date of Birth: 1986/02/11  Today's Date: 03/01/2021 SLP Individual Time: 1035-1130 SLP Individual Time Calculation (min): 55 min  Short Term Goals: Week 1: SLP Short Term Goal 1 (Week 1): Patient will consume current diet with minimal overt s/s of aspiration with Min verbal cues for use of swallowing compensatory strategies. SLP Short Term Goal 2 (Week 1): Patient will utilize speech intelligibility strategies at the sentence level to improve articulatory precision with supervision level verbal cues. SLP Short Term Goal 3 (Week 1): Patient will demosntrate sustained attention to functional tasks for 15 minutes with Mod verbal cues for redirection. SLP Short Term Goal 4 (Week 1): Patient will demonstrate functional problem solving for basic and familiar tasks with Mod verbal cues. SLP Short Term Goal 5 (Week 1): Patient will utilize external memory aids to recall new, daily information with Mod verbal cues.  Skilled Therapeutic Interventions: Skilled treatment session focused on cognitive goals. Upon arrival, patient was awake in bed. With extra time and Mod A, patient sat EOB. Mod verbal cues were needed for safety due to impulsivity prior to transfer to the wheelchair. Patient required Mod-Max A verbal and visual cues for visual scanning, sustained attention and error awareness during a complex medication management task. Patient's overall function impacted by impulsivity with task despite cueing. SLP also engaged patient in conversation regarding activities patient can do in his room to stay cognitively engaged and reduce time spent napping. Patient reported that him and his family would play cards together. SLP utilized the opportunity to teach the patient "Blink." Patient required Min verbal cues for problem solving and left visual scanning throughout task. Educated wife on recommendations, she  verbalized understanding and card holder was given per her request. Patient left upright in wheelchair with alarm on, wife present and all needs within reach. Continue with current plan of care.      Pain No/Denies Pain   Therapy/Group: Individual Therapy  Corynn Solberg 03/01/2021, 12:39 PM

## 2021-03-01 NOTE — Progress Notes (Signed)
Patient continuing to be sexually inappropriate towards male staff members and making them uncomfortable. Male visitors find his inappropriate behavior to be hilarious. Patient reminded to keep conversations appropriate when talking to male staff.

## 2021-03-02 DIAGNOSIS — I69354 Hemiplegia and hemiparesis following cerebral infarction affecting left non-dominant side: Secondary | ICD-10-CM

## 2021-03-02 DIAGNOSIS — R5383 Other fatigue: Secondary | ICD-10-CM

## 2021-03-02 DIAGNOSIS — E8809 Other disorders of plasma-protein metabolism, not elsewhere classified: Secondary | ICD-10-CM

## 2021-03-02 DIAGNOSIS — E785 Hyperlipidemia, unspecified: Secondary | ICD-10-CM

## 2021-03-02 DIAGNOSIS — E46 Unspecified protein-calorie malnutrition: Secondary | ICD-10-CM

## 2021-03-02 MED ORDER — PROSOURCE PLUS PO LIQD
30.0000 mL | Freq: Two times a day (BID) | ORAL | Status: DC
  Administered 2021-03-02 – 2021-03-16 (×14): 30 mL via ORAL
  Filled 2021-03-02 (×26): qty 30

## 2021-03-02 NOTE — Progress Notes (Signed)
Orthopedic Tech Progress Note Patient Details:  David Craig 11-24-1985 [id number] Called order to [facility] Patient ID: David Craig, male   DOB: October 01, 1985, 35 y.o.   MRN: [id number]  [name] 03/02/2021, 6:29 PM

## 2021-03-02 NOTE — Progress Notes (Signed)
PROGRESS NOTE   Subjective/Complaints: Patient seen laying in bed this AM.  He states he slept well overnight, confirmed with sleep chart. He is about to work with therapies.   ROS: Denies CP, SOB, N/V/D  Objective:   No results found. No results for input(s): WBC, HGB, HCT, PLT in the last 72 hours.  No results for input(s): NA, K, CL, CO2, GLUCOSE, BUN, CREATININE, CALCIUM in the last 72 hours.   Intake/Output Summary (Last 24 hours) at 03/02/2021 1109 Last data filed at 03/02/2021 0958 Gross per 24 hour  Intake 220 ml  Output 1150 ml  Net -930 ml         Physical Exam: Vital Signs Blood pressure 122/75, pulse 68, temperature 97.8 F (36.6 C), temperature source Oral, resp. rate 16, height 5\' 8"  (1.727 m), weight 98.7 kg, SpO2 97 %. Constitutional: No distress . Vital signs reviewed. HENT: Normocephalic.  Atraumatic. Eyes: EOMI. No discharge. Cardiovascular: No JVD.  RRR. Respiratory: Normal effort.  No stridor.  Bilateral clear to auscultation. GI: Non-distended.  BS +. Skin: Warm and dry.  Intact. Psych: Normal mood.  Normal behavior. Musc: No edema in extremities.  No tenderness in extremities. Neuro: Alert and oriented, except for date of month Dysarthria Left facial weakness Motor: LUE/LLE: 0/5 proximal to distal  Assessment/Plan: 1. Functional deficits which require 3+ hours per day of interdisciplinary therapy in a comprehensive inpatient rehab setting. Physiatrist is providing close team supervision and 24 hour management of active medical problems listed below. Physiatrist and rehab team continue to assess barriers to discharge/monitor patient progress toward functional and medical goals  Care Tool:  Bathing    Body parts bathed by patient: Left arm, Chest, Abdomen, Front perineal area, Right upper leg, Left upper leg, Face, Right lower leg   Body parts bathed by helper: Left lower leg, Right arm,  Buttocks     Bathing assist Assist Level: Moderate Assistance - Patient 50 - 74%     Upper Body Dressing/Undressing Upper body dressing   What is the patient wearing?: Pull over shirt    Upper body assist Assist Level: Minimal Assistance - Patient > 75%    Lower Body Dressing/Undressing Lower body dressing      What is the patient wearing?: Underwear/pull up, Pants     Lower body assist Assist for lower body dressing: Moderate Assistance - Patient 50 - 74%     Toileting Toileting    Toileting assist Assist for toileting: Independent with assistive device Assistive Device Comment: urinal   Transfers Chair/bed transfer  Transfers assist     Chair/bed transfer assist level: Moderate Assistance - Patient 50 - 74% (squat pivot)     Locomotion Ambulation   Ambulation assist      Assist level: 2 helpers Assistive device:  (R hand rail) Max distance: 25'   Walk 10 feet activity   Assist     Assist level: 2 helpers Assistive device:  (R hand rail)   Walk 50 feet activity   Assist Walk 50 feet with 2 turns activity did not occur: Safety/medical concerns         Walk 150 feet activity   Assist Walk  150 feet activity did not occur: Safety/medical concerns         Walk 10 feet on uneven surface  activity   Assist Walk 10 feet on uneven surfaces activity did not occur: Safety/medical concerns         Wheelchair     Assist Is the patient using a wheelchair?: Yes Type of Wheelchair: Manual    Wheelchair assist level: Dependent - Patient 0% Max wheelchair distance: 150'    Wheelchair 50 feet with 2 turns activity    Assist        Assist Level: Dependent - Patient 0%   Wheelchair 150 feet activity     Assist      Assist Level: Dependent - Patient 0%   Blood pressure 122/75, pulse 68, temperature 97.8 F (36.6 C), temperature source Oral, resp. rate 16, height 5\' 8"  (1.727 m), weight 98.7 kg, SpO2 97 %.  Medical  Problem List and Plan: 1.  Left-sided hemiparesis secondary to right PCA infarction secondary to terminal right ICA occlusion status post failed mechanical thrombectomy etiology likely secondary to cocaine use/vasospasm  Continue CIR    WHO/PRAFO ordered 2.  Antithrombotics: -DVT/anticoagulation: Lower extremity Dopplers negative Pharmaceutical: Lovenox             -antiplatelet therapy: Aspirin 81 mg daily and Plavix 75 mg daily x90 days then aspirin alone 3. Pain Management: Tylenol as needed  Controlled on 9/30 4. Mood/sleep-wake: Provide emotional support  -scheduled seroquel at HS             -antipsychotic agents: seroquel, to help with irritability also  -9/29 added amantadine to help with daytime fatigue, 100mg  qd  -added nicotine patch for cigarette craving  Appears to be improving on 9/30 5. Neuropsych: This patient is capable of making decisions on his own behalf. 6. Skin/Wound Care: Routine skin checks. Encourage appropriate nutrition 7. Fluids/Electrolytes/Nutrition: encourage PO 8.  Polysubstance abuse.  Provide counseling 9.  Hyperlipidemia: Lipitor 10.  Obesity.  BMI 34.97.  Dietary follow-up 11. Post-stroke dysphagia:  advanced to regular diet, tolerating 12. Hypoalbuminemia  Supplement initiated on 9/30  LOS: 8 days A FACE TO FACE EVALUATION WAS PERFORMED  David Craig 10/30 03/02/2021, 11:09 AM

## 2021-03-02 NOTE — Progress Notes (Signed)
Physical Therapy Session Note  Patient Details  Name: David Craig MRN: [id number] Date of Birth: September 05, 1985  Today's Date: 03/02/2021 PT Individual Time: 2458-0998 1430-1455 PT Individual Time Calculation (min): 57 min and 25   Short Term Goals: Week 1:  PT Short Term Goal 1 (Week 1): Pt will perform bed mobility consistently with minA. PT Short Term Goal 2 (Week 1): Pt will perform sit to stand transfer consistently with minA. PT Short Term Goal 3 (Week 1): Pt will perform bed to chair transfer consistently with minA. PT Short Term Goal 4 (Week 1): Pt will ambulate x50' with modA +1 and LRAD.  Skilled Therapeutic Interventions/Progress Updates:  Session 1 no pain reported.   Pt received supine in bed and agreeable to PT. Supine>sit transfer with min assist and cues for attention to the LUE. Sitting balance EOB while performing upper body dressing with hand over hand assist to manage the LUE. Squat pivot transfer to [facility] with min assist on the R.   Pt's breakfast brought into room by NT. Sustained attention task to complete breakfast with cues for visual scanning to the L.   WC mobility through hall with supervision assist with cues for attention to the L side to prevent hitting obstacles.   Gait training with RW mod assist 2 x 8f PT required to assist with limb advancement of the LLE with fatigue as well as provide cues for weight shift and to decrease force of heel contact on the RLE to force improved activation of the LLE in stance.   Pt returned to room and performed stand pivot transfer to bed with min assist for safety. Sit>supine completed with min assist to the LLE and left supine in bed with call bell in reach and all needs met.   Session 2. No pain reported throughout session   Pt received supine in bed and agreeable to PT. Supine>sit transfer with min assist. PT assisted pt to don shoes sitting EOB squat pivot transfer to WC with min A*. Pt transported to rehab gym.  Sit<>stand at high low table with RLE blocked to prevent buckling. Pt engaged in Checkers with use for RUE with LUE supported on table. Noted to have improved weight shift R with increased time in standing. Pt returned to room and performed squat pivot transfer to bed with min assist. Sit>supine completed with min assist and LLE,  and left supine in bed with call bell in reach and all needs met.           Therapy Documentation Precautions:  Precautions Precautions: Fall Precaution Comments: L hemi, L homonymous hemianopsia, R gaze preference Restrictions Weight Bearing Restrictions: No General:     Therapy/Group: Individual Therapy  ALorie Phenix9/30/2022, 9:51 AM

## 2021-03-02 NOTE — Progress Notes (Signed)
Occupational Therapy Weekly Progress Note  Patient Details  Name: David Craig MRN: [id number] Date of Birth: 1986/01/28  Beginning of progress report period: February 23, 2021 End of progress report period: March 02, 2021  Today's Date: 03/02/2021 OT Individual Time: 9563-8756 OT Individual Time Calculation (min): 75 min    Patient has met 2 of 3 short term goals.  David Craig is making steady progress with OT at this time.  He is able to complete UB selfcare at min assist in supported sitting with LB bathing at mod assist and LB dressing at overall max assist, but approaching mod assist.  He completes squat pivot transfers to the right side with min assist and to the left hemi side with mod assist.  Stand pivots to the [facility] are mod assist as well to the right with max assist to the left secondary to not being able to support himself with the LLE.  He continues to be impulsive, requiring cueing to slow down and wait for therapist.  Sustained attention continues to be limited as well with pt demonstrating increased internal distraction during selfcare tasks.  Left visual field cut is also present with pt requiring mod instructional cueing to compensate when attempting to locate items left of midline.  Overall feel pt is progressing well toward min assist/supervision level goals.  Recommend continued CIR level therapy to continue at this time.      Patient continues to demonstrate the following deficits: muscle weakness and muscle paralysis, impaired timing and sequencing, unbalanced muscle activation, and decreased coordination, field cut and hemianopsia, decreased attention to left, decreased attention, decreased safety awareness, and decreased memory, and decreased sitting balance, decreased standing balance, decreased postural control, hemiplegia, and decreased balance strategies and therefore will continue to benefit from skilled OT intervention to enhance overall performance with BADL and  Reduce care partner burden.  Patient progressing toward long term goals..  Continue plan of care.  OT Short Term Goals Week 2:  OT Short Term Goal 1 (Week 2): Pt will complete LB dressing with no more than Mod balance assistance in standing OT Short Term Goal 2 (Week 2): Pt will complete UB dressing with min guard assist following hemi dressing techniques. OT Short Term Goal 3 (Week 2): Pt will complete stand pivot transfers to the 3:1 with mod assist. OT Short Term Goal 4 (Week 2): Pt will complete LB bathing with min assist sit to stand.  Skilled Therapeutic Interventions/Progress Updates:    Pt in bed to start, requiring mod assist for supine to sit with min assist for squat pivot to the wheelchair.  Therapist assisted with donning and tying his shoes.  He was then taken to the therapy gym where he transferred to the mat at [facility] assist stand pivot.  NMES was placed on the left infraspinatus for facilitation of external rotation.  Intensity was set at 52 with PPS a 35 and pulse width at 300.  On time was 10 seconds with off time for 5 seconds.  He tolerated 20 mins of activation while focusing on external rotation in the arm with hand placed on top of a therapy ball.  Trace movements noted at times but not consistent.  No report of pain or discomfort during stimulation or post stimulation.  Next, had him transition to working on active elbow extension with shoulder flexion using a tilted stool.  He was able to complete 2 sets of 10 reps pushing stool to target and then bringing it back to the starting  position.  Min demonstrational cueing for upright posture and to maintain visual attention on the hand when trying to activate movement.  Pt then voiced the need to toilet so completed mod assist squat pivot transfer to the wheelchair and then back to the room.  He completed toilet transfer with mod assist and use of the rail with mod assist for clothing management and toilet hygiene.  Finished session with  washing hands at the sink with hand over hand assist to use the LUE and for transfer to the bed at mod assist to the left.  Educated pt on self PROM exercises for the elbow and shoulder to be completed on his own.  Also stressed the importance of getting up OOB for meals over the weekend and not staying in bed.  Call button and phone in reach with safety alarm in place.    Therapy Documentation Precautions:  Precautions Precautions: Fall Precaution Comments: L hemi, L homonymous hemianopsia, R gaze preference Restrictions Weight Bearing Restrictions: No  Pain: Pain Assessment Pain Scale: Faces Pain Score: 0-No pain ADL: See Care Tool Section for some details of mobility and selfcare   Therapy/Group: Individual Therapy  David Craig OTR/L 03/02/2021, 3:43 PM

## 2021-03-02 NOTE — Progress Notes (Signed)
Speech Language Pathology Weekly Progress and Session Note  Patient Details  Name: Jaise Moser MRN: [id number] Date of Birth: 1986-03-17  Beginning of progress report period: February 22, 2021 End of progress report period: March 02, 2021  Today's Date: 03/02/2021 SLP Individual Time: 1015-1100 SLP Individual Time Calculation (min): 45 min  Short Term Goals: Week 1: SLP Short Term Goal 1 (Week 1): Patient will consume current diet with minimal overt s/s of aspiration with Min verbal cues for use of swallowing compensatory strategies. SLP Short Term Goal 1 - Progress (Week 1): Not met SLP Short Term Goal 2 (Week 1): Patient will utilize speech intelligibility strategies at the sentence level to improve articulatory precision with supervision level verbal cues. SLP Short Term Goal 2 - Progress (Week 1): Not met SLP Short Term Goal 3 (Week 1): Patient will demosntrate sustained attention to functional tasks for 15 minutes with Mod verbal cues for redirection. SLP Short Term Goal 3 - Progress (Week 1): Not met SLP Short Term Goal 4 (Week 1): Patient will demonstrate functional problem solving for basic and familiar tasks with Mod verbal cues. SLP Short Term Goal 4 - Progress (Week 1): Not met SLP Short Term Goal 5 (Week 1): Patient will utilize external memory aids to recall new, daily information with Mod verbal cues. SLP Short Term Goal 5 - Progress (Week 1): Met    New Short Term Goals: Week 2: SLP Short Term Goal 1 (Week 2): Patient will consume current diet with minimal overt s/s of aspiration with Mod verbal cues for use of swallowing compensatory strategies. SLP Short Term Goal 2 (Week 2): Patient will utilize speech intelligibility strategies at the sentence level to improve articulatory precision with Min level verbal cues. SLP Short Term Goal 3 (Week 2): Patient will demosntrate sustained attention to functional tasks for 15 minutes with Mod verbal cues for redirection. SLP  Short Term Goal 4 (Week 2): Patient will utilize external memory aids to recall new, daily information with Min verbal cues. SLP Short Term Goal 5 (Week 2): Patient will demonstrate functional problem solving for basic and familiar tasks with Mod verbal cues.  Weekly Progress Updates: Patient has made minimal gains and has met 1 of 5 STGs consistently at this time. Currently, patient's biggest barrier to progress is his impulsivity and inappropriate behavior, especially with male staff members. Patient is consuming regular textures with thin liquids with minimal overt s/s of aspiration but requires Mod-Max A multimodal cues for use of swallowing compensatory strategies due to impulsivity. Patient continues to demonstrate moderate-severe cognitive impairments and requires Mod-Max A multimodal cues to complete functional and familiar tasks safely in regards to attention, visual scanning, problem solving and awareness. Mild deficits in speech intelligibility are also noted due to imprecise consonants. Patient and family education ongoing. Patient would benefit from continued skilled SLP intervention to maximize his cognitive, swallowing and speech function prior to discharge.     Intensity: Minumum of 1-2 x/day, 30 to 90 minutes Frequency: 3 to 5 out of 7 days Duration/Length of Stay: 03/22/21 Treatment/Interventions: Cognitive remediation/compensation;Dysphagia/aspiration precaution training;Internal/external aids;Cueing hierarchy;Environmental controls;Therapeutic Activities;Functional tasks;Patient/family education;Speech/Language facilitation   Daily Session  Skilled Therapeutic Interventions:   Skilled treatment session focused on cognitive goals. Upon arrival, patient was awake in bed but required extra time and Mod verbal encouragement to initiation siting EOB. Patient transferred to the wheelchair with Mod verbal and tactile cues to maximize safety due to impulsivity.  Patient's mother present  and reported that she brought him  a word search as he enjoyed working on them prior to admission and was asking if he could "still work on them." Patient initially completing the task accurately and located ~4 words in 5 minutes with Min verbal cues, however, due to decreased attention, patient started to circle random letters despite them not forming a word. Max A multimodal cues were needed for left visual scanning throughout as all the word he found was located within his right visual field. Patient's mother educated on difficulty with task. Patient requested coffee. SLP provided decaf coffee with patient performing all the set-up for cream and sugar. Patient provided Max cues to slow rate with drinking due to the coffee being hot, however, patient remained impulsive with moderate right anterior spillage noted with Mod verbal cues needed to self-monitor and correct. Recommend patient continue current diet with full supervision for safety. Patient transferred back to bed at end of session. Patient left with alarm on and all needs within reach. Continue with current plan of care.    Pain No/Denies Pain   Therapy/Group: Individual Therapy  Leen Tworek 03/02/2021, 6:42 AM

## 2021-03-03 NOTE — Progress Notes (Signed)
   03/03/21 1445  What Happened  Was fall witnessed? Yes  Who witnessed fall? Spouse  Patients activity before fall to/from bed, chair, or stretcher;other (comment) Furniture conservator/restorer)  Point of contact buttocks  Was patient injured? Yes (Abrasion)  Follow Up  MD notified Yes  Time MD notified 1513  Family notified Yes - comment  Time family notified 1445  Additional tests No  Simple treatment Dressing  Progress note created (see row info) Yes  Adult Fall Risk Assessment  Risk Factor Category (scoring not indicated) Fall has occurred during this admission (document High fall risk)  Age 35  Fall History: Fall within 6 months prior to admission 5  Elimination; Bowel and/or Urine Incontinence 0  Elimination; Bowel and/or Urine Urgency/Frequency 0  Medications: includes PCA/Opiates, Anti-convulsants, Anti-hypertensives, Diuretics, Hypnotics, Laxatives, Sedatives, and Psychotropics 3  Patient Care Equipment 1  Mobility-Assistance 2  Mobility-Gait 2  Mobility-Sensory Deficit 0  Altered awareness of immediate physical environment 0  Impulsiveness 2  Lack of understanding of one's physical/cognitive limitations 0  Total Score 15  Patient Fall Risk Level High fall risk  Adult Fall Risk Interventions  Required Bundle Interventions *See Row Information* High fall risk - low, moderate, and high requirements implemented  Additional Interventions Use of appropriate toileting equipment (bedpan, BSC, etc.)  Screening for Fall Injury Risk (To be completed on HIGH fall risk patients) - Assessing Need for Floor Mats  Risk For Fall Injury- Criteria for Floor Mats Previous fall this admission  Will Implement Floor Mats Yes  Vitals  Temp 98.2 F (36.8 C)  Temp Source Oral  BP 124/88  BP Location Right Arm  BP Method Automatic  Patient Position (if appropriate) Sitting  Pulse Rate 84  Resp 18  Oxygen Therapy  SpO2 98 %  O2 Device Room Air

## 2021-03-03 NOTE — Plan of Care (Signed)
  Problem: RH SKIN INTEGRITY Goal: RH STG MAINTAIN SKIN INTEGRITY WITH ASSISTANCE Description: STG Maintain Skin Integrity With Min Assistance. Outcome: Progressing   Problem: RH SAFETY Goal: RH STG ADHERE TO SAFETY PRECAUTIONS W/ASSISTANCE/DEVICE Description: STG Adhere to Safety Precautions With Min Assistance/Device. Outcome: Progressing

## 2021-03-04 ENCOUNTER — Other Ambulatory Visit: Payer: Self-pay

## 2021-03-04 NOTE — Progress Notes (Signed)
PROGRESS NOTE   Subjective/Complaints:  Pt asleep- wife says he gets irritable when woken up- saw at 8:15 am- no therapy yet.  Wife at bedside- she's concerned about his level of sedation during the day- she feels he should be more awake.      ROS: limited secondary to sleeping  Objective:   No results found. No results for input(s): WBC, HGB, HCT, PLT in the last 72 hours.  No results for input(s): NA, K, CL, CO2, GLUCOSE, BUN, CREATININE, CALCIUM in the last 72 hours.   Intake/Output Summary (Last 24 hours) at 03/04/2021 1034 Last data filed at 03/04/2021 0400 Gross per 24 hour  Intake 120 ml  Output 1050 ml  Net -930 ml         Physical Exam: Vital Signs Blood pressure 122/81, pulse 70, temperature 98.3 F (36.8 C), temperature source Oral, resp. rate 14, height 5\' 8"  (1.727 m), weight 98.7 kg, SpO2 97 %.    General: asleep- leg thrown over the side of bed; wrist splint not on correctly due to sleep; wife at bedside;  NAD HENT: conjugate gaze; oropharynx moist CV: regular rate; no JVD Pulmonary: CTA B/L; no W/R/R- good air movement GI: soft, NT, ND, (+)BS Psychiatric: asleep Neurological: sleeping peacefully  Skin: Warm and dry.  Intact. Psych: Normal mood.  Normal behavior. Musc: No edema in extremities.  No tenderness in extremities. Neuro: Alert and oriented, except for date of month Dysarthria Left facial weakness Motor: LUE/LLE: 0/5 proximal to distal  Assessment/Plan: 1. Functional deficits which require 3+ hours per day of interdisciplinary therapy in a comprehensive inpatient rehab setting. Physiatrist is providing close team supervision and 24 hour management of active medical problems listed below. Physiatrist and rehab team continue to assess barriers to discharge/monitor patient progress toward functional and medical goals  Care Tool:  Bathing    Body parts bathed by patient: Left arm,  Chest, Abdomen, Front perineal area, Right upper leg, Left upper leg, Face, Right lower leg   Body parts bathed by helper: Left lower leg, Right arm, Buttocks     Bathing assist Assist Level: Moderate Assistance - Patient 50 - 74%     Upper Body Dressing/Undressing Upper body dressing   What is the patient wearing?: Pull over shirt    Upper body assist Assist Level: Minimal Assistance - Patient > 75%    Lower Body Dressing/Undressing Lower body dressing      What is the patient wearing?: Underwear/pull up, Pants     Lower body assist Assist for lower body dressing: Moderate Assistance - Patient 50 - 74%     Toileting Toileting    Toileting assist Assist for toileting: Moderate Assistance - Patient 50 - 74% Assistive Device Comment: urinal   Transfers Chair/bed transfer  Transfers assist     Chair/bed transfer assist level: Moderate Assistance - Patient 50 - 74% (squat pivot)     Locomotion Ambulation   Ambulation assist      Assist level: 2 helpers Assistive device:  (R hand rail) Max distance: 25'   Walk 10 feet activity   Assist     Assist level: 2 helpers Assistive device:  (R hand  rail)   Walk 50 feet activity   Assist Walk 50 feet with 2 turns activity did not occur: Safety/medical concerns         Walk 150 feet activity   Assist Walk 150 feet activity did not occur: Safety/medical concerns         Walk 10 feet on uneven surface  activity   Assist Walk 10 feet on uneven surfaces activity did not occur: Safety/medical concerns         Wheelchair     Assist Is the patient using a wheelchair?: Yes Type of Wheelchair: Manual    Wheelchair assist level: Dependent - Patient 0% Max wheelchair distance: 150'    Wheelchair 50 feet with 2 turns activity    Assist        Assist Level: Dependent - Patient 0%   Wheelchair 150 feet activity     Assist      Assist Level: Dependent - Patient 0%   Blood  pressure 122/81, pulse 70, temperature 98.3 F (36.8 C), temperature source Oral, resp. rate 14, height 5\' 8"  (1.727 m), weight 98.7 kg, SpO2 97 %.  Medical Problem List and Plan: 1.  Left-sided hemiparesis secondary to right PCA infarction secondary to terminal right ICA occlusion status post failed mechanical thrombectomy etiology likely secondary to cocaine use/vasospasm  Continue CIR    WHO/PRAFO ordered  -Continue CIR- PT, OT and SLP- per wife's concern, will ask if an increase in Amantadine is appropriate? 2.  Antithrombotics: -DVT/anticoagulation: Lower extremity Dopplers negative Pharmaceutical: Lovenox             -antiplatelet therapy: Aspirin 81 mg daily and Plavix 75 mg daily x90 days then aspirin alone 3. Pain Management: Tylenol as needed  Controlled on 9/30 4. Mood/sleep-wake: Provide emotional support  -scheduled seroquel at HS             -antipsychotic agents: seroquel, to help with irritability also  -9/29 added amantadine to help with daytime fatigue, 100mg  qd  -added nicotine patch for cigarette craving  Appears to be improving on 9/30  10/2- let Dr 10/30 determine if increase in Amantadine is appropriate 5. Neuropsych: This patient is capable of making decisions on his own behalf. 6. Skin/Wound Care: Routine skin checks. Encourage appropriate nutrition 7. Fluids/Electrolytes/Nutrition: encourage PO 8.  Polysubstance abuse.  Provide counseling 9.  Hyperlipidemia: Lipitor 10.  Obesity.  BMI 34.97.  Dietary follow-up 11. Post-stroke dysphagia:  advanced to regular diet, tolerating 12. Hypoalbuminemia  Supplement initiated on 9/30  LOS: 10 days A FACE TO FACE EVALUATION WAS PERFORMED  Dimas Scheck 03/04/2021, 10:34 AM

## 2021-03-04 NOTE — Progress Notes (Signed)
Patients wife Seward Grater is upset that therapy was rescheduled for later on this afternoon. Patients wife was previously educated that patient is to have male therapists due to inappropriate behavior. I assured patients wife that he would still have therapy today, however patient has to wait until a male therapists is available today. Patient denies pain or discomfort. Wife remains at bedside. Call light within reach, bed in low position. Personal items within reach.

## 2021-03-04 NOTE — Progress Notes (Signed)
Physical Therapy Session Note  Patient Details  Name: David Craig MRN: [id number] Date of Birth: 11/04/1985  Today's Date: 03/04/2021 PT Individual Time: 1300-1400 PT Individual Time Calculation (min): 60 min   Short Term Goals: Week 2:     Skilled Therapeutic Interventions/Progress Updates: Pt presents supine in bed and agreeable to therapy.  Pt required cueing 2/2 impulsive behavior to get untangled from bedsheets before transferring.  Pt required mod A for sup to sit using siderail.  Pt performed squat pivot bed to w/c to Left w/ mod A and verbal cues for safety.  Pt negotiated w/c in hallways up to 100' using hemi technique, occasional verbal cues for list to right.  Pt performed multiple sit to stand transfers w/ mod A and 1 step commands breaking down into steps beginning w/ L foot placement, hand placement, manual placement of L hand in ortho assist on RW,  and then reverse for stand to sit.  Pt performed standing peg board w/ increased weight shift to left for WB to LLE w/ blocking L knee.  Pt amb w/ HHA/mod A up to 10' w/ decreased BOSbut appropriate step length.  Pt amb x 20' w/ RW and mod A, blocking to L knee and assist for placement.  Pt very impulsive requiring cueing for speed and safety and especially w/ turns to approach seat.  Pt required sequencing in 1-step for safe approach, but pt continues to attempt sitting before completing turn or removing L hand from ortho assist.  Pt returned to room and performed SPT w/c > bed w/ mod A.  Pt transferred sit to supine w/ min A for LLE.  Bed alarm on and all needs in reach.     Therapy Documentation Precautions:  Precautions Precautions: Fall Precaution Comments: L hemi, L homonymous hemianopsia, R gaze preference Restrictions Weight Bearing Restrictions: No General:   Vital Signs: Therapy Vitals Temp: 98.2 F (36.8 C) Temp Source: Oral Pulse Rate: 73 Resp: 18 BP: 118/77 Patient Position (if appropriate): Lying Oxygen  Therapy SpO2: 96 % O2 Device: Room Air Pain: no c/o.       Therapy/Group: Individual Therapy  [name] 03/04/2021, 2:32 PM

## 2021-03-05 MED ORDER — AMANTADINE HCL 100 MG PO CAPS
100.0000 mg | ORAL_CAPSULE | Freq: Two times a day (BID) | ORAL | Status: DC
  Administered 2021-03-05 – 2021-03-21 (×30): 100 mg via ORAL
  Filled 2021-03-05 (×35): qty 1

## 2021-03-05 MED ORDER — QUETIAPINE FUMARATE 50 MG PO TABS
50.0000 mg | ORAL_TABLET | Freq: Every day | ORAL | Status: DC
  Administered 2021-03-05 – 2021-03-10 (×6): 50 mg via ORAL
  Filled 2021-03-05 (×7): qty 1

## 2021-03-05 NOTE — Progress Notes (Signed)
Speech Language Pathology Daily Session Note  Patient Details  Name: [name] MRN: [id number] Date of Birth: 1986/02/13  Today's Date: 03/05/2021 SLP Individual Time: 1191-4782 SLP Individual Time Calculation (min): 30 min  Short Term Goals: Week 2: SLP Short Term Goal 1 (Week 2): Patient will consume current diet with minimal overt s/s of aspiration with Mod verbal cues for use of swallowing compensatory strategies. SLP Short Term Goal 2 (Week 2): Patient will utilize speech intelligibility strategies at the sentence level to improve articulatory precision with Min level verbal cues. SLP Short Term Goal 3 (Week 2): Patient will demosntrate sustained attention to functional tasks for 15 minutes with Mod verbal cues for redirection. SLP Short Term Goal 4 (Week 2): Patient will utilize external memory aids to recall new, daily information with Min verbal cues. SLP Short Term Goal 5 (Week 2): Patient will demonstrate functional problem solving for basic and familiar tasks with Mod verbal cues.  Skilled Therapeutic Interventions: Skilled treatment session focused on cognitive goals. Upon arrival, patient was awake in bed and agreeable to SLP session. SLP facilitated session by providing Min verbal cues for safety with transfer from bed to wheelchair. Patient participated in a calendar task that focused on visual scanning and attention with overall supervision level visual cues for error awareness. Patient with decreased impulsivity today and took appropriate time to complete task and independently utilized the calendar on his phone to assess accuracy. No inappropriate behavior noted  with decreased impulsivity and improved speech intelligibility today. Patient transferred back to bed and left with alarm on and all needs within reach. Continue with current plan of care.      Pain No/Denies Pain   Therapy/Group: Individual Therapy  Michaeljames Milnes 03/05/2021, 3:23 PM

## 2021-03-05 NOTE — Progress Notes (Signed)
Physical Therapy Weekly Progress Note  Patient Details  Name: David Craig MRN: [id number] Date of Birth: 1986/05/05  Beginning of progress report period: February 23, 2021 End of progress report period: March 05, 2021  Today's Date: 03/05/2021 PT Individual Time: 1055-1205 PT Individual Time Calculation (min): 70 min   Patient has met 1 of 4 short term goals.  Pt is progressing toward mobility goals, improving independence with bed mobility, transfers, and ambulation. Pt is very impulsive with poor retention/adherence toward cueing, which has slowed progress. Pt has recently improved activation of L lower extremity muscle groups, and has made corresponding progress toward ambulation goals. Pt will benefit from scheduled family education closer to time of discharge.  Patient continues to demonstrate the following deficits muscle weakness, decreased cardiorespiratoy endurance, decreased coordination, decreased attention to left, and decreased sitting balance, decreased standing balance, decreased postural control, hemiplegia, and decreased balance strategies and therefore will continue to benefit from skilled PT intervention to increase functional independence with mobility.  Patient progressing toward long term goals..  Continue plan of care.  PT Short Term Goals Week 1:  PT Short Term Goal 1 (Week 1): Pt will perform bed mobility consistently with minA. PT Short Term Goal 1 - Progress (Week 1): Progressing toward goal PT Short Term Goal 2 (Week 1): Pt will perform sit to stand transfer consistently with minA. PT Short Term Goal 2 - Progress (Week 1): Progressing toward goal PT Short Term Goal 3 (Week 1): Pt will perform bed to chair transfer consistently with minA. PT Short Term Goal 3 - Progress (Week 1): Progressing toward goal PT Short Term Goal 4 (Week 1): Pt will ambulate x50' with modA +1 and LRAD. PT Short Term Goal 4 - Progress (Week 1): Met Week 2:  PT Short Term Goal 1 (Week  2): Pt will perform sit to stand consistently with minA. PT Short Term Goal 2 (Week 2): Pt will perform bed to chair consistently with minA. PT Short Term Goal 3 (Week 2): Pt will ambulate x50' with minA and LRAD. PT Short Term Goal 4 (Week 2): Pt will perform bed mobility consistently with minA.  Skilled Therapeutic Interventions/Progress Updates:  Ambulation/gait training;Community reintegration;DME/adaptive equipment instruction;Neuromuscular re-education;Psychosocial support;Stair training;UE/LE Strength taining/ROM;Wheelchair propulsion/positioning;Balance/vestibular training;Discharge planning;Functional electrical stimulation;Pain management;Skin care/wound management;Therapeutic Activities;UE/LE Coordination activities;Cognitive remediation/compensation;Disease management/prevention;Patient/family education;Functional mobility training;Therapeutic Exercise;Splinting/orthotics;Visual/perceptual remediation/compensation   Pt received supine in bed and agrees to therapy with some encouragement. No complaint of pain. Supine to sit with modA and cues for body mechanics and sequencing. Pt performs stand pivot transfer to [facility] with modA and cues for positioning and safety. WC transport to gym for time management. Pt performs squat pivot transfer to mat table with modA. Pt pulling testicles out of pants while seated on mat. PT educates pt on inappropriateness of pulling out testicles and explains to pt that he needs to adjust approach to therapy due to consistency of inappropriate behaviors. PT also discusses potential benefits of talking to neuropsychologist and pt is agreeable. Pt attitude improves following talk. Pt performs sit to stand with RW and L hand splint and performs R leg lifts with PT providing light blocking of L knee and cues for quad activation. Pt then ambulates x15' with modA, with notable L toe drag/steppage gait. PT ace wraps L foot for dorsiflexion and eversion assistance and pt ambulates  2x50' with RW and L hand splint, with modA. PT facilitates L lateral weight shift and min blocking of L knee for stability, with verbal cues for  swing-through gait pattern on R to promote increased WB through L leg and more typical gait pattern. Following extended seated rest break, pt ambulates x80' with modA +1 and +2 WC follow for safety. Stand pivot transfer back to bed with modA. Pt left supine with alarm intact and all needs within reach.  Therapy Documentation Precautions:  Precautions Precautions: Fall Precaution Comments: L hemi, L homonymous hemianopsia, R gaze preference Restrictions Weight Bearing Restrictions: No  Therapy/Group: Individual Therapy  Breck Coons, PT, DPT 03/05/2021, 12:57 PM

## 2021-03-05 NOTE — Progress Notes (Addendum)
Occupational Therapy Session Note  Patient Details  Name: David Craig MRN: [id number] Date of Birth: 1985-12-12  Today's Date: 03/05/2021 OT Individual Time: 0800-0900 OT Individual Time Calculation (min): 60 min    Short Term Goals: Week 2:  OT Short Term Goal 1 (Week 2): Pt will complete LB dressing with no more than Mod balance assistance in standing OT Short Term Goal 2 (Week 2): Pt will complete UB dressing with min guard assist following hemi dressing techniques. OT Short Term Goal 3 (Week 2): Pt will complete stand pivot transfers to the 3:1 with mod assist. OT Short Term Goal 4 (Week 2): Pt will complete LB bathing with min assist sit to stand.  Skilled Therapeutic Interventions/Progress Updates:    Session 1: (0800-0900)  No report of pain.  Pt worked on shower and dressing during session.  Mod assist for supine to sit EOB with mod assist for squat pivots to the wheelchair and tub bench, all on the right side.  He was able to remove clothing on his LB with mod assist.  He was able to complete UB bathing at min assist level.  Max hand over hand assist for washing the RUE with the left hand.  Mod assist for standing to wash front and back peri area.  He was able to transfer to the wheelchair at mod assist for work on dressing at the sink.  Increased impulsivity noted with work on LB dressing secondary to trying to place the RLE in his shorts before finishing donning the LLE.  Mod assist needed to complete.  Min assist for donning pullover shirt with mod assist for socks using one handed technique with total assist for shoes.  He was left sitting up in the wheelchair with the call button and phone in reach and safety belt in place in preparation for psychology eval.    Session 2: ([id number])  Pt completed supine to sit with mod assist to begin session.  While sitting EOB had pt review self PROM exercises for the left arm and hand.  He was able to return demonstrate with min instructional  cueing following hemi techniques.  Increased left shoulder pain noted with self flexion greater than 90 degrees.  Educated pt to only raise the LUE to a height that was not painful and to not try and push it further.  Discussed subluxation and importance of positioning the LUE to help to decrease pain.  Placed NMES on the left shoulder for treatment of subluxation at level 5 with use of the Saebo Stim One.  Pt was able to tolerate for 60 mins without any adverse reactions or pain. He was transitioned back to bed after placement of NMES at mod assist level with call button and phone in reach as well as safety alarm in place.  See below for NMES parameters and settings.  Saebo Stim One 330 pulse width 35 Hz pulse rate On 8 sec/ off 8 sec Ramp up/ down 2 sec Symmetrical Biphasic wave form  Max intensity at 500 Ohm load   Therapy Documentation Precautions:  Precautions Precautions: Fall Precaution Comments: L hemi, L homonymous hemianopsia, R gaze preference Restrictions Weight Bearing Restrictions: No  Pain: Pain Assessment Pain Scale: 0-10 Pain Score: 0-No pain Faces Pain Scale: No hurt ADL: See Care Tool Section for some details of mobility and selfcare   Therapy/Group: Individual Therapy  David Craig OTR/L 03/05/2021, 12:08 PM

## 2021-03-05 NOTE — Progress Notes (Signed)
PROGRESS NOTE   Subjective/Complaints: Pt up with therapy. No issues this morning. Irritability this weekend? Slept well   ROS: Patient denies fever, rash, sore throat, blurred vision, nausea, vomiting, diarrhea, cough, shortness of breath or chest pain, joint or back pain, headache, or mood change.   Objective:   No results found. No results for input(s): WBC, HGB, HCT, PLT in the last 72 hours.  No results for input(s): NA, K, CL, CO2, GLUCOSE, BUN, CREATININE, CALCIUM in the last 72 hours.   Intake/Output Summary (Last 24 hours) at 03/05/2021 0924 Last data filed at 03/04/2021 1733 Gross per 24 hour  Intake 240 ml  Output 1000 ml  Net -760 ml         Physical Exam: Vital Signs Blood pressure 113/80, pulse (!) 59, temperature 98.3 F (36.8 C), resp. rate 17, height 5\' 8"  (1.727 m), weight 98.7 kg, SpO2 96 %.    Constitutional: No distress . Vital signs reviewed. HEENT: NCAT, EOMI, oral membranes moist Neck: supple Cardiovascular: RRR without murmur. No JVD    Respiratory/Chest: CTA Bilaterally without wheezes or rales. Normal effort    GI/Abdomen: BS +, non-tender, non-distended Ext: no clubbing, cyanosis, or edema Psych: pleasant and cooperative  Skin: Warm and dry.  Intact. Psych: Normal mood.  Normal behavior. Musc: No edema in extremities.  No tenderness in extremities. Neuro: Alert and oriented, except for date of month Dysarthria Left facial weakness Motor: LUE/LLE: still grossly 0-1/5 proximal to distal  Assessment/Plan: 1. Functional deficits which require 3+ hours per day of interdisciplinary therapy in a comprehensive inpatient rehab setting. Physiatrist is providing close team supervision and 24 hour management of active medical problems listed below. Physiatrist and rehab team continue to assess barriers to discharge/monitor patient progress toward functional and medical goals  Care  Tool:  Bathing    Body parts bathed by patient: Left arm, Chest, Abdomen, Front perineal area, Right upper leg, Left upper leg, Face, Right lower leg   Body parts bathed by helper: Left lower leg, Right arm, Buttocks     Bathing assist Assist Level: Moderate Assistance - Patient 50 - 74%     Upper Body Dressing/Undressing Upper body dressing   What is the patient wearing?: Pull over shirt    Upper body assist Assist Level: Minimal Assistance - Patient > 75%    Lower Body Dressing/Undressing Lower body dressing      What is the patient wearing?: Underwear/pull up, Pants     Lower body assist Assist for lower body dressing: Moderate Assistance - Patient 50 - 74%     Toileting Toileting    Toileting assist Assist for toileting: Moderate Assistance - Patient 50 - 74% Assistive Device Comment: urinal   Transfers Chair/bed transfer  Transfers assist     Chair/bed transfer assist level: Moderate Assistance - Patient 50 - 74%     Locomotion Ambulation   Ambulation assist      Assist level: Maximal Assistance - Patient 25 - 49% Assistive device: Walker-rolling Max distance: 20   Walk 10 feet activity   Assist     Assist level: Maximal Assistance - Patient 25 - 49% Assistive device: Walker-rolling  Walk 50 feet activity   Assist Walk 50 feet with 2 turns activity did not occur: Safety/medical concerns         Walk 150 feet activity   Assist Walk 150 feet activity did not occur: Safety/medical concerns         Walk 10 feet on uneven surface  activity   Assist Walk 10 feet on uneven surfaces activity did not occur: Safety/medical concerns         Wheelchair     Assist Is the patient using a wheelchair?: Yes Type of Wheelchair: Manual    Wheelchair assist level: Contact Guard/Touching assist Max wheelchair distance: 100    Wheelchair 50 feet with 2 turns activity    Assist        Assist Level: Contact  Guard/Touching assist   Wheelchair 150 feet activity     Assist      Assist Level: Dependent - Patient 0%   Blood pressure 113/80, pulse (!) 59, temperature 98.3 F (36.8 C), resp. rate 17, height 5\' 8"  (1.727 m), weight 98.7 kg, SpO2 96 %.  Medical Problem List and Plan: 1.  Left-sided hemiparesis secondary to right PCA infarction secondary to terminal right ICA occlusion status post failed mechanical thrombectomy etiology likely secondary to cocaine use/vasospasm  Continue CIR    WHO/PRAFO ordered  -Continue CIR therapies including PT, OT, and SLP  2.  Antithrombotics: -DVT/anticoagulation: Lower extremity Dopplers negative Pharmaceutical: Lovenox             -antiplatelet therapy: Aspirin 81 mg daily and Plavix 75 mg daily x90 days then aspirin alone 3. Pain Management: Tylenol as needed  Controlled on 9/30 4. Mood/sleep-wake: Provide emotional support  -scheduled seroquel at [facility]             -antipsychotic agents: seroquel, to help with irritability also  -9/29 added amantadine to help with daytime fatigue, 100mg  qd  -added nicotine patch for cigarette craving  Appears to be improving on 9/30  10/3 increase amantadine to 100mg  bid   -consider day time seroquel but don't want to exacerabate fatigue 5. Neuropsych: This patient is capable of making decisions on his own behalf. 6. Skin/Wound Care: Routine skin checks. Encourage appropriate nutrition 7. Fluids/Electrolytes/Nutrition: encourage PO 8.  Polysubstance abuse.  Provide counseling 9.  Hyperlipidemia: Lipitor 10.  Obesity.  BMI 34.97.  Dietary follow-up 11. Post-stroke dysphagia:  advanced to regular diet, tolerating 12. Hypoalbuminemia  Supplement initiated on 9/30  LOS: 11 days A FACE TO FACE EVALUATION WAS PERFORMED  10/30 03/05/2021, 9:24 AM

## 2021-03-06 NOTE — Progress Notes (Signed)
PROGRESS NOTE   Subjective/Complaints: Pt up in gym.sleeping ok. Says he feels a little more awake  ROS: Limited due to cognitive/behavioral .   Objective:   No results found. No results for input(s): WBC, HGB, HCT, PLT in the last 72 hours.  No results for input(s): NA, K, CL, CO2, GLUCOSE, BUN, CREATININE, CALCIUM in the last 72 hours.   Intake/Output Summary (Last 24 hours) at 03/06/2021 0913 Last data filed at 03/06/2021 0755 Gross per 24 hour  Intake 120 ml  Output --  Net 120 ml         Physical Exam: Vital Signs Blood pressure 124/83, pulse 65, temperature 97.9 F (36.6 C), temperature source Oral, resp. rate 17, height 5\' 8"  (1.727 m), weight 98.7 kg, SpO2 95 %.    Constitutional: No distress . Vital signs reviewed. HEENT: NCAT, EOMI, oral membranes moist Neck: supple Cardiovascular: RRR without murmur. No JVD    Respiratory/Chest: CTA Bilaterally without wheezes or rales. Normal effort    GI/Abdomen: BS +, non-tender, non-distended Ext: no clubbing, cyanosis, or edema Psych: pleasant and cooperative  Skin: Warm and dry.  Intact. Psych: Normal mood.  Normal behavior. Musc: No edema in extremities.  No tenderness in extremities. Neuro: Alert and oriented Dysarthria Left facial weakness Motor: LUE 1/5 delt,pec, 0/5 distally. LLE 2- to 2/5 HF, KE  Assessment/Plan: 1. Functional deficits which require 3+ hours per day of interdisciplinary therapy in a comprehensive inpatient rehab setting. Physiatrist is providing close team supervision and 24 hour management of active medical problems listed below. Physiatrist and rehab team continue to assess barriers to discharge/monitor patient progress toward functional and medical goals  Care Tool:  Bathing    Body parts bathed by patient: Left arm, Chest, Abdomen, Front perineal area, Right upper leg, Left upper leg, Face, Right lower leg   Body parts bathed  by helper: Left lower leg, Right arm, Buttocks     Bathing assist Assist Level: Moderate Assistance - Patient 50 - 74%     Upper Body Dressing/Undressing Upper body dressing   What is the patient wearing?: Pull over shirt    Upper body assist Assist Level: Minimal Assistance - Patient > 75%    Lower Body Dressing/Undressing Lower body dressing      What is the patient wearing?: Underwear/pull up, Pants     Lower body assist Assist for lower body dressing: Moderate Assistance - Patient 50 - 74%     Toileting Toileting    Toileting assist Assist for toileting: Moderate Assistance - Patient 50 - 74% Assistive Device Comment: urinal   Transfers Chair/bed transfer  Transfers assist     Chair/bed transfer assist level: Moderate Assistance - Patient 50 - 74%     Locomotion Ambulation   Ambulation assist      Assist level: Maximal Assistance - Patient 25 - 49% Assistive device: Walker-rolling Max distance: 20   Walk 10 feet activity   Assist     Assist level: Maximal Assistance - Patient 25 - 49% Assistive device: Walker-rolling   Walk 50 feet activity   Assist Walk 50 feet with 2 turns activity did not occur: Safety/medical concerns  Walk 150 feet activity   Assist Walk 150 feet activity did not occur: Safety/medical concerns         Walk 10 feet on uneven surface  activity   Assist Walk 10 feet on uneven surfaces activity did not occur: Safety/medical concerns         Wheelchair     Assist Is the patient using a wheelchair?: Yes Type of Wheelchair: Manual    Wheelchair assist level: Contact Guard/Touching assist Max wheelchair distance: 100    Wheelchair 50 feet with 2 turns activity    Assist        Assist Level: Contact Guard/Touching assist   Wheelchair 150 feet activity     Assist      Assist Level: Dependent - Patient 0%   Blood pressure 124/83, pulse 65, temperature 97.9 F (36.6 C),  temperature source Oral, resp. rate 17, height 5\' 8"  (1.727 m), weight 98.7 kg, SpO2 95 %.  Medical Problem List and Plan: 1.  Left-sided hemiparesis secondary to right PCA infarction secondary to terminal right ICA occlusion status post failed mechanical thrombectomy etiology likely secondary to cocaine use/vasospasm  Continue CIR    WHO/PRAFO ordered  -Continue CIR therapies including PT, OT, and SLP. Interdisciplinary team conference today to discuss goals, barriers to discharge, and dc planning.   2.  Antithrombotics: -DVT/anticoagulation: Lower extremity Dopplers negative Pharmaceutical: Lovenox             -antiplatelet therapy: Aspirin 81 mg daily and Plavix 75 mg daily x90 days then aspirin alone 3. Pain Management: Tylenol as needed  Controlled on 9/30 4. Mood/sleep-wake: Provide emotional support  -scheduled seroquel at HS             -antipsychotic agents: seroquel, to help with irritability also  -9/29 added amantadine to help with daytime fatigue, 100mg  qd  -added nicotine patch for cigarette craving  Appears to be improving on 9/30  10/3 increased amantadine to 100mg  bid--observe  -consider day time seroquel but don't want to exacerabate fatigue 5. Neuropsych: This patient is capable of making decisions on his own behalf. 6. Skin/Wound Care: Routine skin checks. Encourage appropriate nutrition 7. Fluids/Electrolytes/Nutrition: encourage PO 8.  Polysubstance abuse.  Provide counseling 9.  Hyperlipidemia: Lipitor 10.  Obesity.  BMI 34.97.  Dietary follow-up 11. Post-stroke dysphagia:  advanced to regular diet, tolerating 12. Hypoalbuminemia  Supplement initiated on 9/30  LOS: 12 days A FACE TO FACE EVALUATION WAS PERFORMED  10/30 03/06/2021, 9:13 AM

## 2021-03-06 NOTE — Progress Notes (Addendum)
Occupational Therapy Session Note  Patient Details  Name: David Craig MRN: [id number] Date of Birth: 07-01-1985  Today's Date: 03/06/2021 OT Individual Time: 0802-0906 OT Individual Time Calculation (min): 64 min    Short Term Goals: Week 2:  OT Short Term Goal 1 (Week 2): Pt will complete LB dressing with no more than Mod balance assistance in standing OT Short Term Goal 2 (Week 2): Pt will complete UB dressing with min guard assist following hemi dressing techniques. OT Short Term Goal 3 (Week 2): Pt will complete stand pivot transfers to the 3:1 with mod assist. OT Short Term Goal 4 (Week 2): Pt will complete LB bathing with min assist sit to stand.  Skilled Therapeutic Interventions/Progress Updates:    Pt in bed to start sleeping with spouse present.  He was able to complete supine to sit EOB once awaken with mod assist.  He then worked on donning his socks and shoes with mod assist overall.  Therapist provided max assist for crossing the LLE over the right knee and maintaining while donning the sock and shoe using the RUE only.  He then completed transfer to the wheelchair at [facility] assist and transferred down to the therapy mat in the gym squat pivot at the same level.  Attempted working on weightbearing tasks in sitting with the LUE placed on the mat beside of him.  Had him complete reaching tasks with the RUE while therapist provided mod facilitation at the left knee and max facilitation at the left hand to maintain stability.  Pt with increased internal distraction, rolling a ball down toward the end of the gym to try and get a therapist's attention that has worked with him before.  He also would impulsively grasp the clothespins and try to place them with the RUE, with poor awareness and follow through of direction that therapist was giving him.  Worked on transitions from left lean to sitting as well for increased activation and weightbearing in the left arm.  Max assist to compete.   Finished with work on LUE active movement using a tilted stool.  He was able to push the stool to target with mod instructional cueing and overall min assist.  Completed 3 sets of 10 reps for this task.  Finished session with transfer back to the wheelchair at max assist stand pivot with return to the room.  NMES was placed on the left shoulder for treatment of subluxation.  He was able to tolerate level 7 intensity for 60 mins at the below parameters,without any adverse reactions.  Call button and phone in reach with safety alarm in place and spouse in room.   Saebo Stim One 330 pulse width 35 Hz pulse rate On 8 sec/ off 8 sec Ramp up/ down 2 sec Symmetrical Biphasic wave form  Max intensity at 500 Ohm load   Therapy Documentation Precautions:  Precautions Precautions: Fall Precaution Comments: L hemi, L homonymous hemianopsia, R gaze preference Restrictions Weight Bearing Restrictions: No   Pain: Pain Assessment Pain Scale: 0-10 Pain Score: 0-No pain Faces Pain Scale: Hurts a little bit Pain Type: Acute pain Pain Location: Shoulder Pain Orientation: Left Pain Descriptors / Indicators: Discomfort Pain Onset: With Activity Pain Intervention(s): Repositioned ADL: See Care Tool Section for some details of mobility and selfcare   Therapy/Group: Individual Therapy  Ahmyah Gidley OTR/L 03/06/2021, 12:09 PM

## 2021-03-06 NOTE — Progress Notes (Signed)
Physical Therapy Session Note  Patient Details  Name: David Craig MRN: [id number] Date of Birth: 1986/03/25  Today's Date: 03/06/2021 PT Individual Time: 6387-5643 and 3295-1884 PT Individual Time Calculation (min): 55 min and 55 min  Short Term Goals: Week 2:  PT Short Term Goal 1 (Week 2): Pt will perform sit to stand consistently with minA. PT Short Term Goal 2 (Week 2): Pt will perform bed to chair consistently with minA. PT Short Term Goal 3 (Week 2): Pt will ambulate x50' with minA and LRAD. PT Short Term Goal 4 (Week 2): Pt will perform bed mobility consistently with minA.  Skilled Therapeutic Interventions/Progress Updates:     1st Session: Pt received supine in bed and agrees to therapy. Reports some pain in L shoulder but number not provided. Pt reports pain improved after using Saebo e-stim. PT also provides repositioning and rest for comfort. Pt performs supine to sit with modA and cues for body mechanics and sequencing. Pt verbalizes need for BM. Pt performs sit to stand with RW and L hand splint and ambulates x15' to toilet with modA, with PT cueing for L quad activation, reciprocal gait pattern, and RW management when transferring to toilet. Pt is continent of bowel and requires minA for pericare for thoroughness. Sit to stand and ambulatory transfer back to [facility] with modA.   WC transport to gym for time management. Pt performs stand step transfer to mat table without AD and with modA, with cues for weight shifiting, sequencing, and hand placement when performing stand to sit. Pt performs multiple reps of sit to stand with modA and cues for hand over hand placement on L thigh to promote L lateral weight shift. In standing, PT provides verbal and tactile cues for posture to limit forward flexion at hips and trunk, symmetrical WB between L and R, and modA L knee blocking. Pt progresses to performing standing activity, stepping R foot up onto 5 inch step to encourage increased weight  shifting to the L. Pt takes multiple seated rest breaks.  WC transport back to room. Squat pivot to bed with modA. Left supine with alarm intact and all needs within reach.  2nd Session: Pt received supine in bed asleep. Easily roused and agreeable to therapy. No complaint of pain. Pt attempts supine to sit without physical assistance and is unable to complete. Supine to sit with modA and cues for sequencing and positioning. Stand pivot transfer to [facility] with modA and cues for initiation, body mechanics, and sequencing. WC transport to gym. Pt performs squat pivot toNustep with modA and cues for initiation. Pt completes Nustep with just bilateral upper extremities for strength and endurance training but primarily for reciprocal coordination. PT uses blue theraband around thighs to to keep hips in neutral position. Pt completes x10:00 at workload of 4 with average steps per minute ~25. PT cues for attention to task as well ensuring pt completes full ROM. Squat pivot to WC with modA with cues for initiation and body mechanics.  PT ace wraps L foot to promote dorsiflexion and eversion. Pt performs multiple bouts of sit to stand with minA/modA and cues for anterior weight shift and symmetrical WB between R and L lower extremities. Pt ambulates 2x20' with seated rest break. Pt ambulates without AD, with PT positioned under pt's L arm and providing assistance with L knee stability during stance phase, as well as lateral weight shifting both to the L and R. With adequate weight shift to the R, pt able to  progress L foot without assistance, but tends to adduct L leg, requiring frequent assistance to maintain wider BOS for stability and functional gait pattern.   Squat pivot back to bed with modA and cues to positioning. Pt performs sit to supine with verbal cues for L hip flexor engagement, and pt able to lift leg into bed without phsyical assistance. Pt left supine with alarm intact and all needs within  reach.    Therapy Documentation Precautions:  Precautions Precautions: Fall Precaution Comments: L hemi, L homonymous hemianopsia, R gaze preference Restrictions Weight Bearing Restrictions: No   Therapy/Group: Individual Therapy  Beau Fanny, PT, DPT 03/06/2021, 3:36 PM

## 2021-03-06 NOTE — Progress Notes (Signed)
Speech Language Pathology Daily Session Note  Patient Details  Name: David Craig MRN: [id number] Date of Birth: 05-27-1986  Today's Date: 03/06/2021 SLP Individual Time: 1505-1540 SLP Individual Time Calculation (min): 35 min  Short Term Goals: Week 2: SLP Short Term Goal 1 (Week 2): Patient will consume current diet with minimal overt s/s of aspiration with Mod verbal cues for use of swallowing compensatory strategies. SLP Short Term Goal 2 (Week 2): Patient will utilize speech intelligibility strategies at the sentence level to improve articulatory precision with Min level verbal cues. SLP Short Term Goal 3 (Week 2): Patient will demosntrate sustained attention to functional tasks for 15 minutes with Mod verbal cues for redirection. SLP Short Term Goal 4 (Week 2): Patient will utilize external memory aids to recall new, daily information with Min verbal cues. SLP Short Term Goal 5 (Week 2): Patient will demonstrate functional problem solving for basic and familiar tasks with Mod verbal cues.  Skilled Therapeutic Interventions: Pt received semi-reclined in bed and agreeable to skilled ST intervention with focus on cognitive goals. Pt was accompanied by his spouse. SLP facilitated session by providing sup-to-min A verbal/visual cues for visual scanning and error awareness during mildly complex medication management task by identifying organization errors in BID pillbox. Pt required increased cueing to scan to left and double check for accuracy when assessing >6 medications. Pt completed problem solving scenarios and generated appropriate solutions with min A verbal cues for optimal safety. No inappropriate behavior noted this date. Patient was left in bed with alarm activated and immediate needs within reach at end of session. Continue per current plan of care.      Pain Pain Assessment Pain Scale: 0-10 Pain Score: 0-No pain  Therapy/Group: Individual Therapy  Tamala Ser 03/06/2021,  4:01 PM

## 2021-03-06 NOTE — Patient Care Conference (Signed)
Inpatient RehabilitationTeam Conference and Plan of Care Update Date: 03/06/2021   Time: 10:35 AM    Patient Name: David Craig      Medical Record Number: [id number]  Date of Birth: April 28, 1986 Sex: Male         Room/Bed: 4W08C/4W08C-01 Payor Info: Payor: [employer]UE [employer]UE [employer] / Plan: BCBS COMM PPO / Product Type: *No Product type* /    Admit Date/Time:  02/22/2021  3:23 PM  Primary Diagnosis:  Cerebrovascular accident (CVA) due to occlusion of right posterior cerebral artery [facility])  Hospital Problems: Principal Problem:   Cerebrovascular accident (CVA) due to occlusion of right posterior cerebral artery (HCC) Active Problems:   Hypoalbuminemia due to protein-calorie malnutrition (HCC)   Dyslipidemia   Hemiparesis affecting left side as late effect of stroke [facility])   Lethargy    Expected Discharge Date: Expected Discharge Date: 03/22/21  Team Members Present: Physician leading conference: Dr. Faith Rogue Social Worker Present: Cecile Sheerer, LCSWA Nurse Present: Kennyth Arnold, RN PT Present: Malachi Pro, PT OT Present: Kearney Hard, OT SLP Present: Feliberto Gottron, SLP PPS Coordinator present : Fae Pippin, SLP     Current Status/Progress Goal Weekly Team Focus  Bowel/Bladder   Continent of B & B. LMB 10/3  remain continent  Consistant bowel frequency   Swallow/Nutrition/ Hydration   Regular textures with thin liquids, Mod A for use of swallow strategies  Mod I  use of small bites/sips and a slow rate   ADL's   Mod A overall  lofty supervision, will likely downgrade ro min A  self-care retraining, L NMR< NMES< awareness, appropriateness with staff   Mobility             Communication   Supervision-Mod I  Mod I  use of speech intelligibility strategies   Safety/Cognition/ Behavioral Observations  Min-Mod A  Supervision  sustianed attention, visual sacanning, safety awareness, problem solving   Pain   Patient denies pain  <3  Assess pain Q shift and PRN    Skin   Skin clean, dry, intact  No new breakdown  Assess skin Q shift and PRN     Discharge Planning:  Pt to discharge to home with intermittent support from his wife who works from home and cares for their 1 and 3 y.o. sons. PRN support from family that live close to their home.   Team Discussion: MD continues to adjust medications, has fatigue and impulsive behaviors. Continent B/B, LBM 03/03/2021. Reports no pain, Seroquel for sleep. Had a fall over the weekend. Wife and patient educated on need to wait for staff. Mod assist overall. Continue to work on appropriate behaviors. Having lots of conversations with patient about inappropriate behaviors and awareness of them. Needs lots of cues. Getting some return in left leg. SLP reports less impulsive, no inappropriate comments today during session. Did have memory of conversations today about behaviors and corrected himself. Family wasn't in room today. Patient on target to meet rehab goals: yes  *See Care Plan and progress notes for long and short-term goals.   Revisions to Treatment Plan:  MD continues to adjust medications.  Teaching Needs: Family education, medication management, safety awareness, secondary stroke prevention, behavior management, transfer training, gait training, balance training, endurance training.  Current Barriers to Discharge: Decreased caregiver support, Medical stability, Home enviroment access/layout, Lack of/limited family support, Medication compliance, and Behavior  Possible Resolutions to Barriers: Medication adjustments addressed, behavior management addressed, safety awareness addressed and continues to be addressed.  Medical Summary Current Status: addressing sleep and behavior. amantadine added for agitation. demonstrating improvement in the LUE, eating well  Barriers to Discharge: Medical stability;Behavior   Possible Resolutions to [employer]: ongoing medication assessment and  adjustment, daily assessment of pt data and vs   Continued Need for Acute Rehabilitation Level of Care: The patient requires daily medical management by a physician with specialized training in physical medicine and rehabilitation for the following reasons: Direction of a multidisciplinary physical rehabilitation program to maximize functional independence : Yes Medical management of patient stability for increased activity during participation in an intensive rehabilitation regime.: Yes Analysis of laboratory values and/or radiology reports with any subsequent need for medication adjustment and/or medical intervention. : Yes   I attest that I was present, lead the team conference, and concur with the assessment and plan of the team.   Tennis Must 03/06/2021, 4:25 PM

## 2021-03-06 NOTE — Progress Notes (Signed)
Patient ID: David Craig, male   DOB: August 20, 1985, 35 y.o.   MRN: [id number]  Team Conference Report to Patient/Family  Team Conference discussion was reviewed with the patient and caregiver, including goals, any changes in plan of carein agreement and target discharge date.  Patient and caregiver express understanding and are in agreement.  The patient has a target discharge date of 03/22/21.  SW called pt spouse, provided conference updates. Spouse requesting more information on how we will address patient irritability. No additional questions or concerns, sw will continue to follow up  Andria Rhein 03/06/2021, 12:03 PM

## 2021-03-07 NOTE — Progress Notes (Signed)
Speech Language Pathology Daily Session Note  Patient Details  Name: David Craig MRN: [id number] Date of Birth: 1985/08/18  Today's Date: 03/07/2021 SLP Individual Time: 0930-1015 SLP Individual Time Calculation (min): 45 min  Short Term Goals: Week 2: SLP Short Term Goal 1 (Week 2): Patient will consume current diet with minimal overt s/s of aspiration with Mod verbal cues for use of swallowing compensatory strategies. SLP Short Term Goal 2 (Week 2): Patient will utilize speech intelligibility strategies at the sentence level to improve articulatory precision with Min level verbal cues. SLP Short Term Goal 3 (Week 2): Patient will demosntrate sustained attention to functional tasks for 15 minutes with Mod verbal cues for redirection. SLP Short Term Goal 4 (Week 2): Patient will utilize external memory aids to recall new, daily information with Min verbal cues. SLP Short Term Goal 5 (Week 2): Patient will demonstrate functional problem solving for basic and familiar tasks with Mod verbal cues.  Skilled Therapeutic Interventions: Skilled treatment session focused on cognitive goals. Upon arrival, patient was awake in bed and agreeable to participate in treatment session. Patient was transferred to the wheelchair with Min verbal cues needed to minimize impulsivity. Patient propelled himself to and from the SLP office with Mod verbal cues for obstacle avoidance within his left field of environment. Patient participated in a complex visual scanning/alternating attention PEG task. Patient predicted that he could complete the task in 4 minutes without assistance from SLP. Patient performed task appropriately in regards to attention but demonstrated poor problem solving throughout. Patient required overall Mod A multimodal cues for scanning/problem solving and required 16 minutes to complete. When patient was asked how much time he thought had passed while working on task, he responded, only 6 minutes and  was shocked to learn that 16 minutes had passed. Patient transferred back to bed at end of session. Overall, patient was calm, cooperative and without any inappropriate behaviors. Patient left with alarm on and all needs within reach. Continue with current plan of care.       Pain Pain Assessment Pain Scale: 0-10 Pain Score: 0-No pain  Therapy/Group: Individual Therapy  Kalene Cutler 03/07/2021, 11:16 AM

## 2021-03-07 NOTE — Progress Notes (Signed)
Physical Therapy Session Note  Patient Details  Name: David Craig MRN: [id number] Date of Birth: 1985/10/12  Today's Date: 03/07/2021 PT Individual Time: 0804-0900 and 0973-5329 PT Individual Time Calculation (min): 56 min and 40 min  Short Term Goals: Week 2:  PT Short Term Goal 1 (Week 2): Pt will perform sit to stand consistently with minA. PT Short Term Goal 2 (Week 2): Pt will perform bed to chair consistently with minA. PT Short Term Goal 3 (Week 2): Pt will ambulate x50' with minA and LRAD. PT Short Term Goal 4 (Week 2): Pt will perform bed mobility consistently with minA.  Skilled Therapeutic Interventions/Progress Updates:     1st Session: Pt received supine in bed and agrees to therapy. No complaint of pain. Supine to sit with verbal cues for sequencing and use of bedrails and slightly elevated HOB, but no physical assistance. Pt performs stand pivot transfer to WC with modA. WC transport to gym for time management. Squat pivot to matt able with multimodal cues for body mechanics, initiation, and positioning. Pt noted to have been partially incontinent of urine. PT assists pt with doffing soiled pants and donning clean brief and pants. ModA for return to seated position. Session initially focused on standing blance with emphasis on symmetrical WB, as pt tends to be biased to the R side. Mirro rpositioned for visual feedback. Pt requires modA/minA for sit to stand, with cues for placing hand over hand pressure on L knee to encourage L lateral weight shift. In standing PT blocks L knee and provides mod facilitation of L lateral weight shift, with cues to adjust posture at hips to most effectively shift COG. Pt requires multiple seated rest breaks during activity.   Pt ambulates x35' and x100' with modA, with PT positioned under pt's L arm for support, providing manual stabilization of L knee during stance phase. Pt's L foot ace wrapped for dorsiflexion and eversion assistance, and pt able  to progress L foot during swing phase without manual assistance. Pt left supine in bed with alarm intact and all needs within reach.  2nd Session: Pt received supine in bed and agrees to therapy. No complaint of pain. Supine to sit with bed features and cues for sequencing. Pt performs squat pivot to WC with modA and cues for positioning and body mechanics. WC transport to gym for time management. Pt performs NMR for standing balance and L lower extremity weight bearing and NM return in parallel bars. Multiple reps of sit to stand with minA and cues for body mechanics. In standing pt cued to performs L lateral weight shifts so that L hip touches parallel bar, 2x10 with PT cueing for posture and weight shifting. Following seated rest break, pt performs 3x10 standing hip flexion with alternating lower extremities, cued to touch thigh to parallel bar. Pt requires minA for AAROM of L hip flexion, and mod tactile cueing at L quad for stability when flexing R hip. WC transport back to room. Pt left seated in WC with alarm intact and all needs within reach.  Therapy Documentation Precautions:  Precautions Precautions: Fall Precaution Comments: L hemi, L homonymous hemianopsia, R gaze preference Restrictions Weight Bearing Restrictions: No    Therapy/Group: Individual Therapy  Beau Fanny, PT, DPT 03/07/2021, 3:56 PM

## 2021-03-07 NOTE — Progress Notes (Signed)
Occupational Therapy Session Note  Patient Details  Name: David Craig MRN: [id number] Date of Birth: [birth year]  Today's Date: 03/07/2021 OT Individual Time: 1102-1200 OT Individual Time Calculation (min): 58 min    Short Term Goals: Week 2:  OT Short Term Goal 1 (Week 2): Pt will complete LB dressing with no more than Mod balance assistance in standing OT Short Term Goal 2 (Week 2): Pt will complete UB dressing with min guard assist following hemi dressing techniques. OT Short Term Goal 3 (Week 2): Pt will complete stand pivot transfers to the 3:1 with mod assist. OT Short Term Goal 4 (Week 2): Pt will complete LB bathing with min assist sit to stand.  Skilled Therapeutic Interventions/Progress Updates:    Pt seen for OT treatment with pt in bed to start.  Mod assist for transfer to the EOB with max assist for donning shoes.  He was able to complete squat pivot transfer to the wheelchair with mod assist.  Took him down to the therapy gym where we worked on weightbearing over the LLE and LUE in front of the therapy mat.  Had him work in standing with LUE positioned on the mat in weightbearing while he worked on washing the mat with the RUE and having to stabilize himself with the LUE and mod facilitation.  Mod demonstrational cueing with mod assist needed for sustained activation of left knee extension.  Progressed to functional reaching with the RUE as well to pick up and place clothespins with the RUE for sustained activation of left elbow extension with max facilitation.  Rest breaks provided for 2-3  mins of activity.  Next therapist applied NMES to the left digit extensors for facilitation with use of the Saebo Stim One on level 7.  He was able to tolerate for 10 mins while being instructed to work on active extension along with the stimulation.  Also provided mod to max facilitation for simulated reaching to the therapy mat with the LUE along with stimulation.  No adverse reactions noted to  stimulation.  Therapist then applied stimulation to the left shoulder for treatment of subluxation for 60 mins.  Level seven intensity with parameters below.  No pain noted during or post stimulation.  Returned to the room with transfer to the bed at mod assist stand pivot to complete session.  Call button and phone in reach with safety alarm in place.   Saebo Stim One 330 pulse width 35 Hz pulse rate On 8 sec/ off 8 sec Ramp up/ down 2 sec Symmetrical Biphasic wave form  Max intensity at 500 Ohm load   Therapy Documentation Precautions:  Precautions Precautions: Fall Precaution Comments: L hemi, L homonymous hemianopsia, R gaze preference Restrictions Weight Bearing Restrictions: No  Pain: Pain Assessment Pain Scale: 0-10 Pain Score: 0-No pain ADL: See Care Tool Section for some details of mobility and selfcare   Therapy/Group: Individual Therapy  Nijah Orlich OTR/L 03/07/2021, 3:48 PM

## 2021-03-08 NOTE — Progress Notes (Signed)
PROGRESS NOTE   Subjective/Complaints: He and his wife still asleep when I entered. Reports uneventful night.  Noticed spasms in left hand  ROS: Patient denies fever, rash, sore throat, blurred vision, nausea, vomiting, diarrhea, cough, shortness of breath or chest pain, joint or back pain, headache, or mood change.   Objective:   No results found. No results for input(s): WBC, HGB, HCT, PLT in the last 72 hours.  No results for input(s): NA, K, CL, CO2, GLUCOSE, BUN, CREATININE, CALCIUM in the last 72 hours.   Intake/Output Summary (Last 24 hours) at 03/08/2021 0927 Last data filed at 03/07/2021 1700 Gross per 24 hour  Intake 240 ml  Output 800 ml  Net -560 ml         Physical Exam: Vital Signs Blood pressure 119/84, pulse 65, temperature 97.9 F (36.6 C), resp. rate 18, height 5\' 8"  (1.727 m), weight 98.7 kg, SpO2 95 %.    Constitutional: No distress . Vital signs reviewed. HEENT: NCAT, EOMI, oral membranes moist Neck: supple Cardiovascular: RRR without murmur. No JVD    Respiratory/Chest: CTA Bilaterally without wheezes or rales. Normal effort    GI/Abdomen: BS +, non-tender, non-distended Ext: no clubbing, cyanosis, or edema Psych: pleasant and cooperative  Skin: Warm and dry.  Intact. Psych: Normal mood.  Normal behavior. Musc: No edema in extremities.  No tenderness in extremities. Neuro: Alert and oriented x3. Mild dysarthria.  Left facial weakness Motor: LUE 1/5 delt,pec, 0/5 distally. LLE 2- to 2/5 HF, KE--no changed. Mild flexor tone LUE  Assessment/Plan: 1. Functional deficits which require 3+ hours per day of interdisciplinary therapy in a comprehensive inpatient rehab setting. Physiatrist is providing close team supervision and 24 hour management of active medical problems listed below. Physiatrist and rehab team continue to assess barriers to discharge/monitor patient progress toward functional  and medical goals  Care Tool:  Bathing    Body parts bathed by patient: Left arm, Chest, Abdomen, Front perineal area, Right upper leg, Left upper leg, Face, Right lower leg   Body parts bathed by helper: Left lower leg, Right arm, Buttocks     Bathing assist Assist Level: Moderate Assistance - Patient 50 - 74%     Upper Body Dressing/Undressing Upper body dressing   What is the patient wearing?: Pull over shirt    Upper body assist Assist Level: Minimal Assistance - Patient > 75%    Lower Body Dressing/Undressing Lower body dressing      What is the patient wearing?: Underwear/pull up, Pants     Lower body assist Assist for lower body dressing: Moderate Assistance - Patient 50 - 74%     Toileting Toileting    Toileting assist Assist for toileting: Moderate Assistance - Patient 50 - 74% Assistive Device Comment: urinal   Transfers Chair/bed transfer  Transfers assist     Chair/bed transfer assist level: Moderate Assistance - Patient 50 - 74% (squat pivot)     Locomotion Ambulation   Ambulation assist      Assist level: Maximal Assistance - Patient 25 - 49% Assistive device: Walker-rolling Max distance: 20   Walk 10 feet activity   Assist     Assist level:  Maximal Assistance - Patient 25 - 49% Assistive device: Walker-rolling   Walk 50 feet activity   Assist Walk 50 feet with 2 turns activity did not occur: Safety/medical concerns         Walk 150 feet activity   Assist Walk 150 feet activity did not occur: Safety/medical concerns         Walk 10 feet on uneven surface  activity   Assist Walk 10 feet on uneven surfaces activity did not occur: Safety/medical concerns         Wheelchair     Assist Is the patient using a wheelchair?: Yes Type of Wheelchair: Manual    Wheelchair assist level: Contact Guard/Touching assist Max wheelchair distance: 100    Wheelchair 50 feet with 2 turns activity    Assist         Assist Level: Contact Guard/Touching assist   Wheelchair 150 feet activity     Assist      Assist Level: Dependent - Patient 0%   Blood pressure 119/84, pulse 65, temperature 97.9 F (36.6 C), resp. rate 18, height 5\' 8"  (1.727 m), weight 98.7 kg, SpO2 95 %.  Medical Problem List and Plan: 1.  Left-sided hemiparesis secondary to right PCA infarction secondary to terminal right ICA occlusion status post failed mechanical thrombectomy etiology likely secondary to cocaine use/vasospasm  Continue CIR    WHO/PRAFO ordered  -Continue CIR therapies including PT, OT, and SLP   -early tone, continue ROM, splinting 2.  Antithrombotics: -DVT/anticoagulation: Lower extremity Dopplers negative Pharmaceutical: Lovenox             -antiplatelet therapy: Aspirin 81 mg daily and Plavix 75 mg daily x90 days then aspirin alone 3. Pain Management: Tylenol as needed  Controlled on 9/30 4. Mood/sleep-wake: Provide emotional support  -scheduled seroquel at HS for sleep and irritability             -antipsychotic agents: seroquel   -added nicotine patch for cigarette craving  -continue amantadine for arousal  -consider day time seroquel but don't want to exacerabate fatigue 5. Neuropsych: This patient is capable of making decisions on his own behalf. 6. Skin/Wound Care: Routine skin checks. Encourage appropriate nutrition 7. Fluids/Electrolytes/Nutrition: encourage PO 8.  Polysubstance abuse.  Provide counseling 9.  Hyperlipidemia: Lipitor 10.  Obesity.  BMI 34.97.  Dietary follow-up 11. Post-stroke dysphagia:  advanced to regular diet, tolerating 12. Hypoalbuminemia  Supplement initiated on 9/30  LOS: 14 days A FACE TO FACE EVALUATION WAS PERFORMED  10/30 03/08/2021, 9:27 AM

## 2021-03-08 NOTE — Progress Notes (Signed)
Speech Language Pathology Daily Session Note  Patient Details  Name: David Craig MRN: [id number] Date of Birth: [birth year]  Today's Date: 03/08/2021 SLP Individual Time: 1045-1130 SLP Individual Time Calculation (min): 45 min  Short Term Goals: Week 2: SLP Short Term Goal 1 (Week 2): Patient will consume current diet with minimal overt s/s of aspiration with Mod verbal cues for use of swallowing compensatory strategies. SLP Short Term Goal 2 (Week 2): Patient will utilize speech intelligibility strategies at the sentence level to improve articulatory precision with Min level verbal cues. SLP Short Term Goal 3 (Week 2): Patient will demosntrate sustained attention to functional tasks for 15 minutes with Mod verbal cues for redirection. SLP Short Term Goal 4 (Week 2): Patient will utilize external memory aids to recall new, daily information with Min verbal cues. SLP Short Term Goal 5 (Week 2): Patient will demonstrate functional problem solving for basic and familiar tasks with Mod verbal cues.  Skilled Therapeutic Interventions: Skilled treatment session focused on cognitive goals. Upon arrival, patient was asleep in bed and required extra time to rouse. Patient transferred to the wheelchair with Min verbal cues needed for safety. SLP facilitated session by providing Mod A verbal cues for recall of his current medications and their functions. However, only supervision level verbal cues were needed for visual scanning, error awareness and problem solving while organizing a 3X/day pill box. Patient propelled himself in the hallway and required Min verbal cues to avoid obstacles within his left visual field. Patient handed off to PT. Continue with current plan of care.      Pain Pain Assessment Pain Scale: Faces Pain Score: 0-No pain  Therapy/Group: Individual Therapy  Dayton Sherr 03/08/2021, 2:49 PM

## 2021-03-08 NOTE — Progress Notes (Signed)
Patient has returned to the unit.

## 2021-03-08 NOTE — Progress Notes (Signed)
Physical Therapy Session Note  Patient Details  Name: David Craig MRN: [id number] Date of Birth: 1986-04-29  Today's Date: 03/08/2021 PT Individual Time: 1130-1200 and 4128-7867 PT Individual Time Calculation (min): 30 min and 43 min  Short Term Goals: Week 2:  PT Short Term Goal 1 (Week 2): Pt will perform sit to stand consistently with minA. PT Short Term Goal 2 (Week 2): Pt will perform bed to chair consistently with minA. PT Short Term Goal 3 (Week 2): Pt will ambulate x50' with minA and LRAD. PT Short Term Goal 4 (Week 2): Pt will perform bed mobility consistently with minA.  Skilled Therapeutic Interventions/Progress Updates:     1st session: Pt received seated in dayroom with SLP. Handed off to PT and agreeable to therapy. No complaint of pain. Pt performs sit to stand with minA and cues for body mechanics and sequencing. Pt assisted into quadruped position on mat table with modA, with cues to lead with R lower extremity. In quadruped, PT provides minA/modA to stabilize L elbow and encourage WB through L upper extremity for NM return. Pt performs 3x10 R upper extremity "flexion slides", primarily designed to force pt to stabilize through L hemibody for movement. Pt assisted into sitting with minA. Stand step transfer to [facility] with modA and multimodal cues for positioning and reciprocal gait pattern. PT provides manual AAROM of L upper extremity through internal and external rotation of shoulder, with incorporation of shoulder and elbow extension. After taking pt through motion several times, PT places pt in optimal posture and position to activate shoulder internal rotators and has pt move through available ROM. Pt demonstrates activation of internal rotators and is able to take some resistance with L pectoral complex. PT educates on importance of posture and position for optimal movements, and provides pt with towel on lap tray to decrease friction.  Stand pivot back to bed with modA and cues  for initiation and sequencing. Sit to supine with cues for body mechanics. Left supine with alarm intact and all needs within reach.  2nd Session: Pt received supine in bed and agrees to therapy. No complaint of pain. Supine to sit with bed features and cues for sequencing and positioning at EOB. Pt performs stand pivot transfer to [facility] with modA and cues for body mechanics. WC transport to gym for time management. Session spent at standing frame, without use of sling. Pt stands with minA/modA and cues for symmetrical WB and hand placement. Standing frame used to block pt's L knee and also provide pt with surface for upper extremities to work on [employer] of L arm. In standing, pt cued to perform horizontal adduction to reach for object on R side. Pt requires towel to decrease friction but is able to perform motion without physical assistance. Pt requires manual assist to bring L arm back to neutral as he still lacks adequate strength in posterior delt/scapular muscles to complete horizontal abduction. Activity progressed by placing 2 inch, then 3 inch step under R foot to promote increased WB through L leg during activity. Multiple seated rest breaks taken. Pt left seated in WC with alarm intact and all needs within reach.  Therapy Documentation Precautions:  Precautions Precautions: Fall Precaution Comments: L hemi, L homonymous hemianopsia, R gaze preference Restrictions Weight Bearing Restrictions: Yes    Therapy/Group: Individual Therapy  Beau Fanny, PT, DPT 03/08/2021, 4:14 PM

## 2021-03-08 NOTE — Progress Notes (Signed)
Patient off floor (with wife) at start of shift and remains off floor at this time.

## 2021-03-08 NOTE — Progress Notes (Signed)
Occupational Therapy Session Note  Patient Details  Name: David Craig MRN: [id number] Date of Birth: 1986/05/29  Today's Date: 03/08/2021 OT Individual Time: 1517-6160 OT Individual Time Calculation (min): 75 min    Short Term Goals: Week 2:  OT Short Term Goal 1 (Week 2): Pt will complete LB dressing with no more than Mod balance assistance in standing OT Short Term Goal 2 (Week 2): Pt will complete UB dressing with min guard assist following hemi dressing techniques. OT Short Term Goal 3 (Week 2): Pt will complete stand pivot transfers to the 3:1 with mod assist. OT Short Term Goal 4 (Week 2): Pt will complete LB bathing with min assist sit to stand.  Skilled Therapeutic Interventions/Progress Updates:    Pt completed shower and dressing to start session.  Max assist for ambulation to the shower with mod facilitation to keep the left knee from buckling.  He then completed undressing at supervision level for UB and mod assist for LB.  Shower was completed with overall mod assist sit to stand with decreased ability to reach his feet secondary to spacing as well as inability to wash the RUE without max hand over hand assist.  He was able to stand with min assist primarily weightbearing over the RLE when washing and rinsing peri area with therapist assist.  He was able to transfer out to the wheelchair at mod assist squat pivot for dressing at the sink.  Min instructional cueing with mod assist for hemi dressing techniques to donn underpants and pants.  Max assist for maintaining right LE crossed over the right knee and maintaining it for starting clothing.  He was able to complete donning pullover shirt with setup.  Therapist provided total assist for TEDs and then needed mod assist for donning shoes.  Once complete, took him down to the therapy gym where he transferred to the therapy mat with mod assist.  Worked on activation of the left arm in sitting with use of a therapy ball with the hand placed  on the ball and the ball on top of a flat board.  He was able to exhibit some trace internal and external rotation.  While working with pt discussed his increased risk factors for CVA by smoking and using drugs.  Pt unaware that these may have been the cause of his CVA.  Will likley need support at discharge for this.  Also discussed this with his spouse as well after session in the hallway.  Finished session with return to the room and transfer back to the bed at max assist stand pivot.  Call button and phone in reach with safety alarm in place.     Therapy Documentation Precautions:  Precautions Precautions: Fall Precaution Comments: L hemi, L homonymous hemianopsia, R gaze preference Restrictions Weight Bearing Restrictions: Yes   Pain: Pain Assessment Pain Scale: Faces Pain Score: 0-No pain ADL: See Care Tool Section for some details of mobility and selfcare   Therapy/Group: Individual Therapy  Bravlio Luca OTR/L 03/08/2021, 12:19 PM

## 2021-03-09 NOTE — Progress Notes (Signed)
Physical Therapy Session Note  Patient Details  Name: David Craig MRN: [id number] Date of Birth: [birth year]  Today's Date: 03/09/2021 PT Individual Time: 0815-0900 and and 1500-1532 PT Individual Time Calculation (min): 45 min and 32 min and Today's Date: 03/09/2021 PT Missed Time: 15 Minutes Missed Time Reason: Nursing care  Short Term Goals: Week 2:  PT Short Term Goal 1 (Week 2): Pt will perform sit to stand consistently with minA. PT Short Term Goal 2 (Week 2): Pt will perform bed to chair consistently with minA. PT Short Term Goal 3 (Week 2): Pt will ambulate x50' with minA and LRAD. PT Short Term Goal 4 (Week 2): Pt will perform bed mobility consistently with minA.  Skilled Therapeutic Interventions/Progress Updates:  1st Session:   Pt misses 15 minutes of skilled PT due to RN care, receiving sponge bath. Pt received supine in bed and agrees to therapy. No complaint of pain. Supine to sit from elevated bed with use of bed rails with cues for positioning at EOB. Pt tasked with transferring to [facility] with as little assistance as possible. Pt able to complete squat pivot to the R with minA and cues for sequencing and positoining. WC transport to gym for time management. Pt stand to litegait rig with CGA and cues for hand placement. Pt remains standing with CGA, as PT dons harness. Pt requires modA for step up onto treadmill. Pt attempts treadmill training with litegait for bodyweight support, but has difficulty clearing L foot and also verbalizes some pain in L shoulder when hanging in dependent position. PT alters activity and provides ace wrap for grip support on L hand to alleviate subluxation pain in L shoulder joint. Pt then performs 4x10 reps of R leg lifts with PT facilitating L quad contraction and L lateral weight shift, with verbal and tactile cues to maintain hips in extended position. ModA for step down from treadmill. Pt left seated in WC with alarm intact and all needs within  reach.  2nd Session: Pt received supine in bed. Agrees to therapy and no complaint of pain. Supine to sit with bed features and cues to engage L hip flexors to assist with transition. Pt performs ambulatory transfer from bed to [facility] with modA HHA and cues for weight shifting and hand placement for safety. WC transport to gym. Squat pivot to mat table with modA toward L side for WB and NM feedback. Pt performs x10 reps of sit to stand with minA/CGA, with cues to use hand over hand pressure on L distal femur to encourage L lateral weight shift and WB through L hemibody. Pt still noted to avoid use of L lower extremity, performing majority of work with R leg. Pt then performs alternating side leaning on elbows, primarily on L elbow for WB through L upper extremity, then using contralateral core muscle groups to return to upright sitting. PT provides manual stability of L elbow and shoulder complex when WB. Pt verbalizes need for bowel movement. Stand pivot from mat>WC>toilet with minA. Pt continent of bowel and requires minA for pericare for thoroughness. Stand pivots from toilet>WC with minA and cues for safety, and modA for squat pivot to bed with cues for initiation and positioning. Left supine with alarm intact and all needs within reach.  Therapy Documentation Precautions:  Precautions Precautions: Fall Precaution Comments: L hemi, L homonymous hemianopsia, R gaze preference Restrictions Weight Bearing Restrictions: Yes    Therapy/Group: Individual Therapy  Beau Fanny, PT, DPT 03/09/2021, 3:47 PM

## 2021-03-09 NOTE — Progress Notes (Signed)
Occupational Therapy Weekly Progress Note  Patient Details  Name: Loyalty Brashier MRN: [id number] Date of Birth: 27-May-1986  Beginning of progress report period: March 03, 2021 End of progress report period: March 09, 2021  Today's Date: 03/09/2021 OT Individual Time: 7416-3845 OT Individual Time Calculation (min): 56 min    Patient has met 3 of 4 short term goals.  Mr. Tiger is making steady progress with OT at this time.  He is currently able to complete UB bathing at min assist level and UB dressing at min guard following hemi dressing techniques.  He is able to complete LB bathing at min assist level with LB dressing at mod assist.  Increased impulsivity is present at times with pt trying to complete standing for pulling up clothing before having therapist in position to safely assist.  He continues to demonstrate Brunnstrum stage II-III movement in the left arm with stage I in the hand.  Max facilitation is needed to incorporate the LUE into basic selfcare tasks as a stabilizer.  He has been completing daily self AAROM exercises for the left arm and hand on his own as well.  Left visual field deficit is still present as well with mod instructional cueing needed to compensate with larger head turns to the left when looking for grooming items on the sink.  Decreased anticipatory awareness remains with decreased selective attention as well.  Overall he is progression at expected rate with current LTGs set at supervision to min assist level.  Feel he is currently on target to meet most of these with recommendation for continued CIR level therapy at this time.  Expected discharge on 03/22/21 with his spouse.  Will provide family education as needed.    Patient continues to demonstrate the following deficits: muscle weakness and muscle paralysis, impaired timing and sequencing, abnormal tone, unbalanced muscle activation, and decreased coordination, field cut, decreased attention to left, decreased  attention, decreased awareness, decreased problem solving, decreased safety awareness, and decreased memory, and decreased sitting balance, decreased standing balance, decreased postural control, hemiplegia, and decreased balance strategies and therefore will continue to benefit from skilled OT intervention to enhance overall performance with BADL and Reduce care partner burden.  Patient progressing toward long term goals..  Continue plan of care.  OT Short Term Goals Week 3:  OT Short Term Goal 1 (Week 3): Pt will complete stand pivot transfers to the 3:1 with mod assist. OT Short Term Goal 2 (Week 3): Pt will use the LUE as a stabilizer during bathing tasks to hold the washcloth when applying soap at mod assis level with min instructional cueing. OT Short Term Goal 3 (Week 3): Pt will complete LB dressing to donn underpants and patns at min assist level for two consecutive sessions.  Skilled Therapeutic Interventions/Progress Updates:    Pt sitting up in the wheelchair to begin session with transport down to the ortho gym for attempted use of the UE ergonometer.  Therapist used ace wrap to the wrap the left hand on the handle of the bike for sustained grip.  Pt completed a few revolutions with therapist assist, however slight shoulder pain was noted with full elbow extension and shoulder flexion on the left.  This task was stopped secondary to not wanting to increase pt's shoulder pain and then he was transported to the therapy gym for transfer to the mat at mod assist.  Worked on LUE weightbearing with functional reaching to target with the RUE.  LUE on was placed on the  mat with focus on active elbow extension to prevent LOB with lean to the left.  Worked on scooting and sit to squat transitions as well with min assist.  Integrated tilted stool for sustained activation of elbow extensors and shoulder flexors as well.  Finished session with return to the room and transfer back to the bed at mod assist  stand pivot.  He was able to transfer to supine with min assist.  Therapist placed NMES to the left shoulder for stimulation of treatment of subluxation.  He was able to tolerate on level 10 for 60 mins without any adverse reactions, pain, or redness.  See below for parameters.  Call button and phone in reach with safety alarm in place.    Therapy Documentation Precautions:  Precautions Precautions: Fall Precaution Comments: L hemi, L homonymous hemianopsia, R gaze preference Restrictions Weight Bearing Restrictions: Yes   Pain: Pain Assessment Pain Scale: 0-10 Pain Score: 0-No pain Faces Pain Scale: Hurts a little bit Pain Type: Neuropathic pain Pain Location: Shoulder Pain Orientation: Left Pain Descriptors / Indicators: Discomfort Pain Onset: With Activity Pain Intervention(s): Repositioned ADL: See Care Tool Section for some details of mobility and selfcare   Therapy/Group: Individual Therapy  Anasia Agro OTR/L 03/09/2021, 12:57 PM

## 2021-03-09 NOTE — Progress Notes (Signed)
Speech Language Pathology Weekly Progress and Session Note  Patient Details  Name: David Craig MRN: [id number] Date of Birth: May 09, 1986  Beginning of progress report period: March 02, 2021 End of progress report period: March 09, 2021  Today's Date: 03/09/2021 SLP Individual Time: 5329-9242 SLP Individual Time Calculation (min): 45 min  Short Term Goals: Week 2: SLP Short Term Goal 1 (Week 2): Patient will consume current diet with minimal overt s/s of aspiration with Mod verbal cues for use of swallowing compensatory strategies. SLP Short Term Goal 1 - Progress (Week 2): Met SLP Short Term Goal 2 (Week 2): Patient will utilize speech intelligibility strategies at the sentence level to improve articulatory precision with Min level verbal cues. SLP Short Term Goal 2 - Progress (Week 2): Met SLP Short Term Goal 3 (Week 2): Patient will demosntrate sustained attention to functional tasks for 15 minutes with Mod verbal cues for redirection. SLP Short Term Goal 3 - Progress (Week 2): Met SLP Short Term Goal 4 (Week 2): Patient will utilize external memory aids to recall new, daily information with Min verbal cues. SLP Short Term Goal 4 - Progress (Week 2): Met SLP Short Term Goal 5 (Week 2): Patient will demonstrate functional problem solving for basic and familiar tasks with Mod verbal cues. SLP Short Term Goal 5 - Progress (Week 2): Met    New Short Term Goals: Week 3: SLP Short Term Goal 1 (Week 3): Patient will consume current diet with minimal overt s/s of aspiration with Min verbal cues for use of swallowing compensatory strategies. SLP Short Term Goal 2 (Week 3): Patient will utilize speech intelligibility strategies at the sentence level to improve articulatory precision with Supervision level verbal cues. SLP Short Term Goal 3 (Week 3): Patient will demosntrate sustained attention to functional tasks for 30 minutes with Mod verbal cues for redirection. SLP Short Term Goal 4  (Week 3): Patient will demonstrate functional problem solving for basic and familiar tasks with Min verbal cues. SLP Short Term Goal 5 (Week 3): Patient will utilize external memory aids to recall new, daily information with Supervision verbal cues. SLP Short Term Goal 6 (Week 3): Patient will self-montior and correct errors during functional tasks with Min A verbal cues.  Weekly Progress Updates: Patient has made excellent gains and has met 5 of 5 STGs this reporting period. Currently, patient is consuming regular textures with thin liquids with minimal overt s/s of aspiration with Mod verbal cues for use of swallowing compensatory strategies. Patient demonstrates improved speech intelligibility and is 100% intelligible at the sentence level. Patient demonstrates improved arousal and attention and can complete functional and mildly complex tasks safely with min-mod verbal cues for problem solving, visual scanning and error awareness. Patient remains cooperative throughout with minimal inappropriate behaviors noted. Patient and family education ongoing. Patient would benefit from continued skilled SLP intervention to maximize his cognitive and swallowing function prior to discharge home.      Intensity: Minumum of 1-2 x/day, 30 to 90 minutes Frequency: 3 to 5 out of 7 days Duration/Length of Stay: 03/22/21 Treatment/Interventions: Cognitive remediation/compensation;Dysphagia/aspiration precaution training;Internal/external aids;Cueing hierarchy;Environmental controls;Therapeutic Activities;Functional tasks;Patient/family education;Speech/Language facilitation   Daily Session  Skilled Therapeutic Interventions: Skilled treatment session focused on cognitive goals. SLP facilitated session by providing Min verbal cues for patient to sustain attention to tasks due to using his phone. Patient participated in a navigation task that focused on visual scanning, appropriate social interactions and problem  solving. Patient appropriately asked for directions from staff members  to find the locations and demonstrated appropriate social interactions throughout. Patient independently wrote all of the locations he needed to locate but demonstrated increased difficulty utilizing the list as he often crossed out the wrong location as completed. Patient required Min verbal cues for visual scanning and avoidance of obstacles within his left visual field while propelling his wheelchair. Patient left upright in the wheelchair with alarm on and all needs within reach. Continue with current plan of care.      Pain No/Denies Pain   Therapy/Group: Individual Therapy  Reeya Bound 03/09/2021, 6:32 AM

## 2021-03-09 NOTE — Progress Notes (Signed)
PROGRESS NOTE   Subjective/Complaints: No new issues. Progressing with therapies.   ROS: Patient denies fever, rash, sore throat, blurred vision, nausea, vomiting, diarrhea, cough, shortness of breath or chest pain, joint or back pain, headache, or mood change.   Objective:   No results found. No results for input(s): WBC, HGB, HCT, PLT in the last 72 hours.  No results for input(s): NA, K, CL, CO2, GLUCOSE, BUN, CREATININE, CALCIUM in the last 72 hours.   Intake/Output Summary (Last 24 hours) at 03/09/2021 1006 Last data filed at 03/09/2021 0749 Gross per 24 hour  Intake 480 ml  Output 400 ml  Net 80 ml         Physical Exam: Vital Signs Blood pressure 113/75, pulse 71, temperature 98.1 F (36.7 C), resp. rate 18, height 5\' 8"  (1.727 m), weight 98.7 kg, SpO2 95 %.    Constitutional: No distress . Vital signs reviewed. HEENT: NCAT, EOMI, oral membranes moist Neck: supple Cardiovascular: RRR without murmur. No JVD    Respiratory/Chest: CTA Bilaterally without wheezes or rales. Normal effort    GI/Abdomen: BS +, non-tender, non-distended Ext: no clubbing, cyanosis, or edema Psych: pleasant and cooperative  Skin: Warm and dry.  Intact. Psych: Normal mood.  Normal behavior. Musc: No edema in extremities.  No tenderness in extremities. Neuro: Alert and oriented x3. Mild dysarthria.  Left facial weakness Motor: LUE 1/5 delt,pec, 0/5 distally. LLE 2- to 2/5 HF, KE--no changed. Mild flexor tone LUE  Assessment/Plan: 1. Functional deficits which require 3+ hours per day of interdisciplinary therapy in a comprehensive inpatient rehab setting. Physiatrist is providing close team supervision and 24 hour management of active medical problems listed below. Physiatrist and rehab team continue to assess barriers to discharge/monitor patient progress toward functional and medical goals  Care Tool:  Bathing    Body parts  bathed by patient: Left arm, Chest, Abdomen, Front perineal area, Right upper leg, Left upper leg, Face, Right lower leg   Body parts bathed by helper: Left lower leg, Right arm, Buttocks     Bathing assist Assist Level: Moderate Assistance - Patient 50 - 74%     Upper Body Dressing/Undressing Upper body dressing   What is the patient wearing?: Pull over shirt    Upper body assist Assist Level: Minimal Assistance - Patient > 75%    Lower Body Dressing/Undressing Lower body dressing      What is the patient wearing?: Underwear/pull up, Pants     Lower body assist Assist for lower body dressing: Moderate Assistance - Patient 50 - 74%     Toileting Toileting    Toileting assist Assist for toileting: Moderate Assistance - Patient 50 - 74% Assistive Device Comment: urinal   Transfers Chair/bed transfer  Transfers assist     Chair/bed transfer assist level: Moderate Assistance - Patient 50 - 74% (squat pivot)     Locomotion Ambulation   Ambulation assist      Assist level: Maximal Assistance - Patient 25 - 49% Assistive device: Walker-rolling Max distance: 20   Walk 10 feet activity   Assist     Assist level: Maximal Assistance - Patient 25 - 49% Assistive device: Walker-rolling  Walk 50 feet activity   Assist Walk 50 feet with 2 turns activity did not occur: Safety/medical concerns         Walk 150 feet activity   Assist Walk 150 feet activity did not occur: Safety/medical concerns         Walk 10 feet on uneven surface  activity   Assist Walk 10 feet on uneven surfaces activity did not occur: Safety/medical concerns         Wheelchair     Assist Is the patient using a wheelchair?: Yes Type of Wheelchair: Manual    Wheelchair assist level: Contact Guard/Touching assist Max wheelchair distance: 100    Wheelchair 50 feet with 2 turns activity    Assist        Assist Level: Contact Guard/Touching assist    Wheelchair 150 feet activity     Assist      Assist Level: Dependent - Patient 0%   Blood pressure 113/75, pulse 71, temperature 98.1 F (36.7 C), resp. rate 18, height 5\' 8"  (1.727 m), weight 98.7 kg, SpO2 95 %.  Medical Problem List and Plan: 1.  Left-sided hemiparesis secondary to right PCA infarction secondary to terminal right ICA occlusion status post failed mechanical thrombectomy etiology likely secondary to cocaine use/vasospasm  Continue CIR    WHO/PRAFO ordered  -Continue CIR therapies including PT, OT, and SLP   -early tone, continue ROM, splinting 2.  Antithrombotics: -DVT/anticoagulation: Lower extremity Dopplers negative Pharmaceutical: Lovenox             -antiplatelet therapy: Aspirin 81 mg daily and Plavix 75 mg daily x90 days then aspirin alone 3. Pain Management: Tylenol as needed  Controlled on 9/30 4. Mood/sleep-wake: Provide emotional support  -scheduled seroquel at [facility] for sleep and irritability             -antipsychotic agents: seroquel   -added nicotine patch for cigarette craving  -amantadine for day time arousal 5. Neuropsych: This patient is capable of making decisions on his own behalf. 6. Skin/Wound Care: Routine skin checks. Encourage appropriate nutrition 7. Fluids/Electrolytes/Nutrition: encourage PO 8.  Polysubstance abuse.  Provide counseling 9.  Hyperlipidemia: Lipitor 10.  Obesity.  BMI 34.97.  Dietary follow-up 11. Post-stroke dysphagia:  advanced to regular diet, tolerating 12. Hypoalbuminemia  Supplement initiated on 9/30  LOS: 15 days A FACE TO FACE EVALUATION WAS PERFORMED  10/30 03/09/2021, 10:06 AM

## 2021-03-10 NOTE — Progress Notes (Signed)
PROGRESS NOTE   Subjective/Complaints: No new problems this morning. Is having back pain and asked about getting an air mattress. I told nurses we could try another standard mattress from supply to see if it's better for him  ROS: Patient denies fever, rash, sore throat, blurred vision, nausea, vomiting, diarrhea, cough, shortness of breath or chest pain, headache, or mood change.  Objective:   No results found. No results for input(s): WBC, HGB, HCT, PLT in the last 72 hours.  No results for input(s): NA, K, CL, CO2, GLUCOSE, BUN, CREATININE, CALCIUM in the last 72 hours.   Intake/Output Summary (Last 24 hours) at 03/10/2021 1028 Last data filed at 03/10/2021 0900 Gross per 24 hour  Intake 840 ml  Output 375 ml  Net 465 ml         Physical Exam: Vital Signs Blood pressure 112/73, pulse 70, temperature 98.2 F (36.8 C), resp. rate 18, height 5\' 8"  (1.727 m), weight 98.7 kg, SpO2 96 %.    Constitutional: No distress . Vital signs reviewed. HEENT: NCAT, EOMI, oral membranes moist Neck: supple Cardiovascular: RRR without murmur. No JVD    Respiratory/Chest: CTA Bilaterally without wheezes or rales. Normal effort    GI/Abdomen: BS +, non-tender, non-distended Ext: no clubbing, cyanosis, or edema Psych: pleasant and cooperative Skin: Warm and dry.  Intact. Psych: Normal mood.  Normal behavior. Musc: No edema in extremities.  No tenderness in extremities. Neuro: Alert and oriented x3. Mild dysarthria.  Left facial weakness Motor: LUE 1/5 delt,pec, 0/5 distally. LLE 2- to 2/5 HF, KE--no changed. Mild flexor tone LUE  Assessment/Plan: 1. Functional deficits which require 3+ hours per day of interdisciplinary therapy in a comprehensive inpatient rehab setting. Physiatrist is providing close team supervision and 24 hour management of active medical problems listed below. Physiatrist and rehab team continue to assess  barriers to discharge/monitor patient progress toward functional and medical goals  Care Tool:  Bathing    Body parts bathed by patient: Left arm, Chest, Abdomen, Front perineal area, Right upper leg, Left upper leg, Face, Right lower leg   Body parts bathed by helper: Left lower leg, Right arm, Buttocks     Bathing assist Assist Level: Moderate Assistance - Patient 50 - 74%     Upper Body Dressing/Undressing Upper body dressing   What is the patient wearing?: Pull over shirt    Upper body assist Assist Level: Minimal Assistance - Patient > 75%    Lower Body Dressing/Undressing Lower body dressing      What is the patient wearing?: Underwear/pull up, Pants     Lower body assist Assist for lower body dressing: Moderate Assistance - Patient 50 - 74%     Toileting Toileting    Toileting assist Assist for toileting: Moderate Assistance - Patient 50 - 74% Assistive Device Comment: urinal   Transfers Chair/bed transfer  Transfers assist     Chair/bed transfer assist level: Moderate Assistance - Patient 50 - 74% (squat pivot)     Locomotion Ambulation   Ambulation assist      Assist level: Maximal Assistance - Patient 25 - 49% Assistive device: Walker-rolling Max distance: 20   Walk 10 feet  activity   Assist     Assist level: Maximal Assistance - Patient 25 - 49% Assistive device: Walker-rolling   Walk 50 feet activity   Assist Walk 50 feet with 2 turns activity did not occur: Safety/medical concerns         Walk 150 feet activity   Assist Walk 150 feet activity did not occur: Safety/medical concerns         Walk 10 feet on uneven surface  activity   Assist Walk 10 feet on uneven surfaces activity did not occur: Safety/medical concerns         Wheelchair     Assist Is the patient using a wheelchair?: Yes Type of Wheelchair: Manual    Wheelchair assist level: Contact Guard/Touching assist Max wheelchair distance: 100     Wheelchair 50 feet with 2 turns activity    Assist        Assist Level: Contact Guard/Touching assist   Wheelchair 150 feet activity     Assist      Assist Level: Dependent - Patient 0%   Blood pressure 112/73, pulse 70, temperature 98.2 F (36.8 C), resp. rate 18, height 5\' 8"  (1.727 m), weight 98.7 kg, SpO2 96 %.  Medical Problem List and Plan: 1.  Left-sided hemiparesis secondary to right PCA infarction secondary to terminal right ICA occlusion status post failed mechanical thrombectomy etiology likely secondary to cocaine use/vasospasm  Continue CIR    WHO/PRAFO ordered  -Continue CIR therapies including PT, OT, and SLP   -early tone, continue ROM, splinting--d/w pt 2.  Antithrombotics: -DVT/anticoagulation: Lower extremity Dopplers negative Pharmaceutical: Lovenox             -antiplatelet therapy: Aspirin 81 mg daily and Plavix 75 mg daily x90 days then aspirin alone 3. Pain Management: Tylenol as needed  Change mattress as above. Pt could bring in foam overlay potentially  -order kpad for back 4. Mood/sleep-wake: Provide emotional support  -scheduled seroquel at [facility] for sleep and irritability             -antipsychotic agents: seroquel   -added nicotine patch for cigarette craving  -amantadine for day time arousal has helped 5. Neuropsych: This patient is capable of making decisions on his own behalf. 6. Skin/Wound Care: Routine skin checks. Encourage appropriate nutrition 7. Fluids/Electrolytes/Nutrition: encourage PO 8.  Polysubstance abuse.  Provide counseling 9.  Hyperlipidemia: Lipitor 10.  Obesity.  BMI 34.97.  Dietary follow-up 11. Post-stroke dysphagia:  advanced to regular diet, tolerating 12. Hypoalbuminemia  Supplement initiated on 9/30  LOS: 16 days A FACE TO FACE EVALUATION WAS PERFORMED  10/30 03/10/2021, 10:28 AM

## 2021-03-11 MED ORDER — QUETIAPINE FUMARATE 50 MG PO TABS
75.0000 mg | ORAL_TABLET | Freq: Every day | ORAL | Status: DC
  Administered 2021-03-11 – 2021-03-12 (×2): 75 mg via ORAL
  Filled 2021-03-11 (×2): qty 1

## 2021-03-11 NOTE — Progress Notes (Signed)
Physical Therapy Session Note  Patient Details  Name: David Craig MRN: [id number] Date of Birth: 1985-12-17  Today's Date: 03/11/2021 PT Individual Time: 1450-1529 PT Individual Time Calculation (min): 39 min   Short Term Goals: Week 2:  PT Short Term Goal 1 (Week 2): Pt will perform sit to stand consistently with minA. PT Short Term Goal 2 (Week 2): Pt will perform bed to chair consistently with minA. PT Short Term Goal 3 (Week 2): Pt will ambulate x50' with minA and LRAD. PT Short Term Goal 4 (Week 2): Pt will perform bed mobility consistently with minA.  Skilled Therapeutic Interventions/Progress Updates:     Pt received supine in bed and agrees to therapy. No complaint of pain. Supine to sit with bed features and cues for positioning at Eob. With pt seated on edge of bed,PT dons shoes providing TotalA for time management. Stand pivot transfer from bed to [facility] with modA and cues for body mechanics and sequencing. WC transport to gym for time management. Pt performs Wii bowling activity for balance training and activity tolerance. Pt performs sit to stand with minA, with PT providing light blocking of L knee and encourage symmetrical WB due to pt tendency to bias toward R side. In standing, pt encouraged to shift weight to the L to step through with R foot while performing bowling motion. PT provides frequent verbal and tactile cues to shift hips to the L to transition COG laterally, and provides modA at L knee for stability during step-through of R leg. Pt has x1 LOB during activity, requiring totalA to guide pt back to [facility] for safety. Otherwise pt maintains standing for entire activity. Following seated rest break, pt ambulates back to room, x100', with modA +1, with facilitation of lateral weight shifting, min to mod blocking of L knee during stance phase, verbal and tactile cues for L quad contraction. Pt performs sit to supine with cues for sequencing. Then pt performs supine bridge with PT  stabilizing L thigh, to scoot up to [facility] Left with alarm intact and all needs within reach.  Therapy Documentation Precautions:  Precautions Precautions: Fall Precaution Comments: L hemi, L homonymous hemianopsia, R gaze preference Restrictions Weight Bearing Restrictions: Yes   Therapy/Group: Individual Therapy  Beau Fanny, PT, DPT 03/11/2021, 3:40 PM

## 2021-03-11 NOTE — Progress Notes (Addendum)
PROGRESS NOTE   Subjective/Complaints: Not sleeping well. Complains of mattress. Asked the nurse again last night for an air mattress  ROS: Patient denies fever, rash, sore throat, blurred vision, nausea, vomiting, diarrhea, cough, shortness of breath or chest pain, joint or back pain, headache, or mood change.  .  Objective:   No results found. No results for input(s): WBC, HGB, HCT, PLT in the last 72 hours.  No results for input(s): NA, K, CL, CO2, GLUCOSE, BUN, CREATININE, CALCIUM in the last 72 hours.   Intake/Output Summary (Last 24 hours) at 03/11/2021 0848 Last data filed at 03/10/2021 0900 Gross per 24 hour  Intake 240 ml  Output --  Net 240 ml         Physical Exam: Vital Signs Blood pressure 126/75, pulse 76, temperature 98.1 F (36.7 C), temperature source Oral, resp. rate 16, height 5\' 8"  (1.727 m), weight 98.7 kg, SpO2 99 %.    Constitutional: No distress . Vital signs reviewed. HEENT: NCAT, EOMI, oral membranes moist Neck: supple Cardiovascular: RRR without murmur. No JVD    Respiratory/Chest: CTA Bilaterally without wheezes or rales. Normal effort    GI/Abdomen: BS +, non-tender, non-distended Ext: no clubbing, cyanosis, or edema Psych: flat Skin: Warm and dry.  Intact. Psych: Normal mood.  Normal behavior. Musc: No edema in extremities.  No tenderness in extremities. Neuro: Alert and oriented x3. Mild dysarthria.  Left facial weakness Motor: LUE 1/5 delt,pec, 0/5 distally. LLE 2- to 2/5 HF, KE--no changed. Mild flexor tone LUE  Assessment/Plan: 1. Functional deficits which require 3+ hours per day of interdisciplinary therapy in a comprehensive inpatient rehab setting. Physiatrist is providing close team supervision and 24 hour management of active medical problems listed below. Physiatrist and rehab team continue to assess barriers to discharge/monitor patient progress toward functional and  medical goals  Care Tool:  Bathing    Body parts bathed by patient: Left arm, Chest, Abdomen, Front perineal area, Right upper leg, Left upper leg, Face, Right lower leg   Body parts bathed by helper: Left lower leg, Right arm, Buttocks     Bathing assist Assist Level: Moderate Assistance - Patient 50 - 74%     Upper Body Dressing/Undressing Upper body dressing   What is the patient wearing?: Pull over shirt    Upper body assist Assist Level: Minimal Assistance - Patient > 75%    Lower Body Dressing/Undressing Lower body dressing      What is the patient wearing?: Underwear/pull up, Pants     Lower body assist Assist for lower body dressing: Moderate Assistance - Patient 50 - 74%     Toileting Toileting    Toileting assist Assist for toileting: Moderate Assistance - Patient 50 - 74% Assistive Device Comment: urinal   Transfers Chair/bed transfer  Transfers assist     Chair/bed transfer assist level: Moderate Assistance - Patient 50 - 74% (squat pivot)     Locomotion Ambulation   Ambulation assist      Assist level: Maximal Assistance - Patient 25 - 49% Assistive device: Walker-rolling Max distance: 20   Walk 10 feet activity   Assist     Assist level: Maximal Assistance -  Patient 25 - 49% Assistive device: Walker-rolling   Walk 50 feet activity   Assist Walk 50 feet with 2 turns activity did not occur: Safety/medical concerns         Walk 150 feet activity   Assist Walk 150 feet activity did not occur: Safety/medical concerns         Walk 10 feet on uneven surface  activity   Assist Walk 10 feet on uneven surfaces activity did not occur: Safety/medical concerns         Wheelchair     Assist Is the patient using a wheelchair?: Yes Type of Wheelchair: Manual    Wheelchair assist level: Contact Guard/Touching assist Max wheelchair distance: 100    Wheelchair 50 feet with 2 turns activity    Assist         Assist Level: Contact Guard/Touching assist   Wheelchair 150 feet activity     Assist      Assist Level: Dependent - Patient 0%   Blood pressure 126/75, pulse 76, temperature 98.1 F (36.7 C), temperature source Oral, resp. rate 16, height 5\' 8"  (1.727 m), weight 98.7 kg, SpO2 99 %.  Medical Problem List and Plan: 1.  Left-sided hemiparesis secondary to right PCA infarction secondary to terminal right ICA occlusion status post failed mechanical thrombectomy etiology likely secondary to cocaine use/vasospasm  Continue CIR    WHO/PRAFO ordered  -Continue CIR therapies including PT, OT, and SLP    -early tone, continue ROM, splinting 2.  Antithrombotics: -DVT/anticoagulation: Lower extremity Dopplers negative Pharmaceutical: Lovenox             -antiplatelet therapy: Aspirin 81 mg daily and Plavix 75 mg daily x90 days then aspirin alone 3. Pain Management: Tylenol as needed  Discussed with pt again. Pt could bring in foam overlay potentially  -ordered kpad for back 4. Mood/sleep-wake: Provide emotional support  -scheduled seroquel at [facility] for sleep and irritability--incr to 75mg  qhs 10/9             -antipsychotic agents: seroquel   -added nicotine patch for cigarette craving  -amantadine for day time arousal has helped 5. Neuropsych: This patient is capable of making decisions on his own behalf. 6. Skin/Wound Care: Routine skin checks. Encourage appropriate nutrition 7. Fluids/Electrolytes/Nutrition: encourage PO 8.  Polysubstance abuse.  Provide counseling 9.  Hyperlipidemia: Lipitor 10.  Obesity.  BMI 34.97.  Dietary follow-up 11. Post-stroke dysphagia:  advanced to regular diet, tolerating 12. Hypoalbuminemia  Supplement initiated on 9/30  LOS: 17 days A FACE TO FACE EVALUATION WAS PERFORMED  12/9 03/11/2021, 8:48 AM

## 2021-03-12 LAB — BASIC METABOLIC PANEL
Anion gap: 8 (ref 5–15)
BUN: 13 mg/dL (ref 6–20)
CO2: 28 mmol/L (ref 22–32)
Calcium: 9.2 mg/dL (ref 8.9–10.3)
Chloride: 105 mmol/L (ref 98–111)
Creatinine, Ser: 0.74 mg/dL (ref 0.61–1.24)
GFR, Estimated: >60 mL/min (ref >60)
Glucose, Bld: 108 mg/dL — ABNORMAL HIGH (ref 70–99)
Potassium: 3.9 mmol/L (ref 3.5–5.1)
Sodium: 141 mmol/L (ref 135–145)

## 2021-03-12 LAB — CBC
HCT: 46.2 % (ref 39.0–52.0)
Hemoglobin: 16 g/dL (ref 13.0–17.0)
MCH: 30.3 pg (ref 26.0–34.0)
MCHC: 34.6 g/dL (ref 30.0–36.0)
MCV: 87.5 fL (ref 80.0–100.0)
Platelets: 151 10*3/uL (ref 150–400)
RBC: 5.28 MIL/uL (ref 4.22–5.81)
RDW: 12.7 % (ref 11.5–15.5)
WBC: 6.4 10*3/uL (ref 4.0–10.5)
nRBC: 0 % (ref 0.0–0.2)

## 2021-03-12 NOTE — Progress Notes (Signed)
Speech Language Pathology Daily Session Note  Patient Details  Name: Dawaun Brancato MRN: [id number] Date of Birth: 1985/12/26  Today's Date: 03/12/2021 SLP Individual Time: 0915-1000 SLP Individual Time Calculation (min): 45 min  Short Term Goals: Week 3: SLP Short Term Goal 1 (Week 3): Patient will consume current diet with minimal overt s/s of aspiration with Min verbal cues for use of swallowing compensatory strategies. SLP Short Term Goal 2 (Week 3): Patient will utilize speech intelligibility strategies at the sentence level to improve articulatory precision with Supervision level verbal cues. SLP Short Term Goal 3 (Week 3): Patient will demosntrate sustained attention to functional tasks for 30 minutes with Mod verbal cues for redirection. SLP Short Term Goal 4 (Week 3): Patient will demonstrate functional problem solving for basic and familiar tasks with Min verbal cues. SLP Short Term Goal 5 (Week 3): Patient will utilize external memory aids to recall new, daily information with Supervision verbal cues. SLP Short Term Goal 6 (Week 3): Patient will self-montior and correct errors during functional tasks with Min A verbal cues.  Skilled Therapeutic Interventions: Skilled treatment session focused on cognitive goals. SLP facilitated session by administering the visualspatial subtests of the Cognitive Linguistic Quick Test (CLQT). Patient scored 0/12 points during a symbol cancellation task, 12/13 points during a clock drawing test, 7/10 points with symbol trails, 4/8 points with mazes and 4/13 points during design generation. Overall, patient demonstrated decreased ability to scan to left field of environment this session compared to previous sessions. Patient transferred back to bed at end of session. Patient left supine in bed with alarm on and all needs within reach. Continue with current plan of care.      Pain Pain Assessment Pain Scale: 0-10 Pain Score: 0-No pain  Therapy/Group:  Individual Therapy  Tkeyah Burkman 03/12/2021, 10:15 AM

## 2021-03-12 NOTE — Progress Notes (Signed)
Physical Therapy Session Note  Patient Details  Name: David Craig MRN: [id number] Date of Birth: [birth year]  Today's Date: 03/12/2021 PT Individual Time: 9924-2683 PT Individual Time Calculation (min): 69 min   Short Term Goals: Week 2:  PT Short Term Goal 1 (Week 2): Pt will perform sit to stand consistently with minA. PT Short Term Goal 2 (Week 2): Pt will perform bed to chair consistently with minA. PT Short Term Goal 3 (Week 2): Pt will ambulate x50' with minA and LRAD. PT Short Term Goal 4 (Week 2): Pt will perform bed mobility consistently with minA.  Skilled Therapeutic Interventions/Progress Updates:     Pt received supine in bed and agrees to therapy. No complaint of pain. Supine to sit with bed features and cues for sequencing and positioning. Pt performs stand pivot transfer to [facility] with CGA and cues for body mechanics and positioning. WC transport to gym for time management. Pt performs BITS system activity to work on standing balance, lateral weight shifting, and L sided attention. Pt utilizes RW with L hand splint, with 2 inch step under R foot to facilitate increased WB through L lower extremity> pt performs multiple activities requiring him to reach across body to the L with R hand. PT provides minA/modA at L knee to prevent buckling and facilitate quad contraction, as well as manual facilitation of L weight shift at hips. Multiple seated rest breaks during activity. Pt stands with CGA/minA each bout, with RW and hand splint, requiring assistance to secure hand in splint. Following, pt ambulates x30' with RW and L hand splint, with minA/modA and multimodal cues for weight shifting to the L, and facilitating L quad contraction during stance phase. Pt says that he feels a little "woozy" during ambulation and requires unplanned seated rest break. BP taken at 117/79. Pt then provided with trial anterior strut walk-on AFO for L foot. Pt ambulates x60' with RW and minA/modA, with slightly  improved gait pattern. Pt demos tendency to use L leg adductors to advance leg rather than hip flexors. WC transport back to room. Pt left seated in WC with alarm intact and all needs within reach.  Therapy Documentation Precautions:  Precautions Precautions: Fall Precaution Comments: L hemi, L homonymous hemianopsia, R gaze preference Restrictions Weight Bearing Restrictions: No   Therapy/Group: Individual Therapy  Beau Fanny, PT, DPT 03/12/2021, 4:23 PM

## 2021-03-12 NOTE — Progress Notes (Signed)
Occupational Therapy Session Note  Patient Details  Name: David Craig MRN: [id number] Date of Birth: 10-May-1986  Today's Date: 03/12/2021 OT Individual Time: 0800-0902 OT Individual Time Calculation (min): 62 min    Short Term Goals: Week 3:  OT Short Term Goal 1 (Week 3): Pt will complete stand pivot transfers to the 3:1 with mod assist. OT Short Term Goal 2 (Week 3): Pt will use the LUE as a stabilizer during bathing tasks to hold the washcloth when applying soap at mod assis level with min instructional cueing. OT Short Term Goal 3 (Week 3): Pt will complete LB dressing to donn underpants and patns at min assist level for two consecutive sessions.  Skilled Therapeutic Interventions/Progress Updates:    Pt seen for OT session with focus on selfcare retraining sit to stand.  Mod assist for supine to sit with max assist for functional mobility to the shower bench from the EOB without device.  Mod assist for positioning of the LLE secondary to scissoring as well as for stabilization of the left knee in weightbearing.  Once he was at the shower he was able to complete bathing with min assist and mod instructional cueing for thoroughness and completion.  Max hand over hand assist for integration of the LUE to hold the washcloth as well as wash the RUE.  He was able to complete standing for washing peri area with min assist and then completed transfer out of the shower at mod assist stand pivot.  He donned his underpants and pants at mod assist level with max assist for crossing and maintaining the LLE over the right knee while donning clothing.  Min assist for sit to stand to pull items over his hips with increased impulsivity noted and pt standing prior to therapist cueing him.  He was then able to donn his gripper socks with mod assist as well.  Finished session with pt eating breakfast with setup for cutting up food.  Pt impulsive as well with this, requiring max instructional cueing to slow down.   Therapist applied NMES to the left shoulder for treatment of subluxation at level 8 intensity for 60 mins.  No sign of pain during stimulation or redness noted post removal.  He was left in the wheelchair with the safety belt in place and call button and phone in reach.  See below for parameters of stimulation.   [place] Stim One 330 pulse width 35 Hz pulse rate On 8 sec/ off 8 sec Ramp up/ down 2 sec Symmetrical Biphasic wave form  Max intensity at 500 Ohm load   Therapy Documentation Precautions:  Precautions Precautions: Fall Precaution Comments: L hemi, L homonymous hemianopsia, R gaze preference Restrictions Weight Bearing Restrictions: No   Pain: Pain Assessment Pain Scale: 0-10 Pain Score: 0-No pain ADL: See Care Tool Section for some details of mobility and selfcare   Therapy/Group: Individual Therapy  Karista Aispuro OTR/L 03/12/2021, 10:58 AM

## 2021-03-12 NOTE — Progress Notes (Signed)
PROGRESS NOTE   Subjective/Complaints: Slept better last night. Kiddingly asked when his air mattress is coming.   ROS: Patient denies fever, rash, sore throat, blurred vision, nausea, vomiting, diarrhea, cough, shortness of breath or chest pain, joint or back pain, headache, or mood change.  .  Objective:   No results found. Recent Labs    03/12/21 0748  WBC 6.4  HGB 16.0  HCT 46.2  PLT 151    Recent Labs    03/12/21 0748  NA 141  K 3.9  CL 105  CO2 28  GLUCOSE 108*  BUN 13  CREATININE 0.74  CALCIUM 9.2     Intake/Output Summary (Last 24 hours) at 03/12/2021 1023 Last data filed at 03/12/2021 0546 Gross per 24 hour  Intake 720 ml  Output 1150 ml  Net -430 ml         Physical Exam: Vital Signs Blood pressure 106/73, pulse 61, temperature 97.9 F (36.6 C), temperature source Oral, resp. rate 16, height 5\' 8"  (1.727 m), weight 98.7 kg, SpO2 95 %.    Constitutional: No distress . Vital signs reviewed. HEENT: NCAT, EOMI, oral membranes moist Neck: supple Cardiovascular: RRR without murmur. No JVD    Respiratory/Chest: CTA Bilaterally without wheezes or rales. Normal effort    GI/Abdomen: BS +, non-tender, non-distended Ext: no clubbing, cyanosis, or edema Psych: still somewhat flat Skin: Warm and dry.  Intact. Psych: Normal mood.  Normal behavior. Musc: No edema in extremities.  No tenderness in extremities. Neuro: Alert and oriented x3. Mild dysarthria.  Left facial weakness Motor: LUE 1/5 delt,pec, 0/5 distally. LLE 2- to 2/5 HF, KE--no changed. Mild flexor tone LUE in finger flexor particularly  Assessment/Plan: 1. Functional deficits which require 3+ hours per day of interdisciplinary therapy in a comprehensive inpatient rehab setting. Physiatrist is providing close team supervision and 24 hour management of active medical problems listed below. Physiatrist and rehab team continue to assess  barriers to discharge/monitor patient progress toward functional and medical goals  Care Tool:  Bathing    Body parts bathed by patient: Left arm, Chest, Abdomen, Front perineal area, Right upper leg, Left upper leg, Face, Right lower leg   Body parts bathed by helper: Left lower leg, Right arm, Buttocks     Bathing assist Assist Level: Moderate Assistance - Patient 50 - 74%     Upper Body Dressing/Undressing Upper body dressing   What is the patient wearing?: Pull over shirt    Upper body assist Assist Level: Minimal Assistance - Patient > 75%    Lower Body Dressing/Undressing Lower body dressing      What is the patient wearing?: Underwear/pull up, Pants     Lower body assist Assist for lower body dressing: Moderate Assistance - Patient 50 - 74%     Toileting Toileting    Toileting assist Assist for toileting: Moderate Assistance - Patient 50 - 74% Assistive Device Comment: urinal   Transfers Chair/bed transfer  Transfers assist     Chair/bed transfer assist level: Moderate Assistance - Patient 50 - 74% (squat pivot)     Locomotion Ambulation   Ambulation assist      Assist level: Maximal Assistance -  Patient 25 - 49% Assistive device: Walker-rolling Max distance: 20   Walk 10 feet activity   Assist     Assist level: Maximal Assistance - Patient 25 - 49% Assistive device: Walker-rolling   Walk 50 feet activity   Assist Walk 50 feet with 2 turns activity did not occur: Safety/medical concerns         Walk 150 feet activity   Assist Walk 150 feet activity did not occur: Safety/medical concerns         Walk 10 feet on uneven surface  activity   Assist Walk 10 feet on uneven surfaces activity did not occur: Safety/medical concerns         Wheelchair     Assist Is the patient using a wheelchair?: Yes Type of Wheelchair: Manual    Wheelchair assist level: Contact Guard/Touching assist Max wheelchair distance: 100     Wheelchair 50 feet with 2 turns activity    Assist        Assist Level: Contact Guard/Touching assist   Wheelchair 150 feet activity     Assist      Assist Level: Dependent - Patient 0%   Blood pressure 106/73, pulse 61, temperature 97.9 F (36.6 C), temperature source Oral, resp. rate 16, height 5\' 8"  (1.727 m), weight 98.7 kg, SpO2 95 %.  Medical Problem List and Plan: 1.  Left-sided hemiparesis secondary to right PCA infarction secondary to terminal right ICA occlusion status post failed mechanical thrombectomy etiology likely secondary to cocaine use/vasospasm  Continue CIR    WHO/PRAFO ordered  -Continue CIR therapies including PT, OT    -early tone, continue ROM, splinting 2.  Antithrombotics: -DVT/anticoagulation: Lower extremity Dopplers negative Pharmaceutical: Lovenox             -antiplatelet therapy: Aspirin 81 mg daily and Plavix 75 mg daily x90 days then aspirin alone 3. Pain Management: Tylenol as needed  Discussed with pt again. Pt could bring in foam overlay potentially  - kpad for back 4. Mood/sleep-wake: Provide emotional support  -scheduled seroquel at HS for sleep and irritability--increased to 75mg  qhs 10/9 with benefit             -antipsychotic agents: seroquel   -added nicotine patch for cigarette craving  -amantadine for day time arousal   5. Neuropsych: This patient is capable of making decisions on his own behalf. 6. Skin/Wound Care: Routine skin checks. Encourage appropriate nutrition 7. Fluids/Electrolytes/Nutrition: eating well  I personally reviewed the patient's labs today.  WNL 8.  Polysubstance abuse.  Provide counseling 9.  Hyperlipidemia: Lipitor 10.  Obesity.  BMI 34.97.  Dietary follow-up 11. Post-stroke dysphagia:  advanced to regular diet, tolerating 12. Hypoalbuminemia  Supplement initiated on 9/30  LOS: 18 days A FACE TO FACE EVALUATION WAS PERFORMED  12/9 03/12/2021, 10:23 AM

## 2021-03-13 MED ORDER — QUETIAPINE FUMARATE 50 MG PO TABS
100.0000 mg | ORAL_TABLET | Freq: Every day | ORAL | Status: DC
  Administered 2021-03-13 – 2021-03-14 (×2): 100 mg via ORAL
  Filled 2021-03-13 (×2): qty 2

## 2021-03-13 NOTE — Progress Notes (Signed)
Physical Therapy Session Note  Patient Details  Name: David Craig MRN: [id number] Date of Birth: May 27, 1986  Today's Date: 03/13/2021 PT Individual Time: 1134-1204 PT Individual Time Calculation (min): 30 min   Short Term Goals: Week 3:     Skilled Therapeutic Interventions/Progress Updates: Pt presents supine in bed and agreeable to therapy.  Pt requires verbal cues for bridging to EOB.  Pt attempts to transfer L sidelying to sit but then falls back to sidelying.  Pt educated on speed of transfers to maintain control vs momentum of push up from bed.  On next attempt, pt requires min A only to complete transfer.  Pt and S.O. educated on AFO don to L LE.  Pt transfers from lowest height bed and min A.  Pt cued for LLE positionand use of R hand on surface.  Pt practiced multiple trials bed <> w/c transfers and min A.  Pt transfers bed > w/c (to Right) w/ facilitation on L knee for WB, but HHA for attempts at step transfer w/c > bed to left.  Pt amb x 3' w/c > bed w/ RW and min A.  Pt educated on sequencing for maintaining safety of L UE.  Pt remained in bed and bed alarm on, all needs in reach.      Therapy Documentation Precautions:  Precautions Precautions: Fall Precaution Comments: L hemi, L homonymous hemianopsia, R gaze preference Restrictions Weight Bearing Restrictions: No General:   Vital Signs:   Pain:0/10 Pain Assessment Pain Scale: 0-10 Pain Score: 0-No pain Mobility:       Therapy/Group: Individual Therapy  [name] 03/13/2021, 12:08 PM

## 2021-03-13 NOTE — Patient Care Conference (Signed)
Inpatient RehabilitationTeam Conference and Plan of Care Update Date: 03/13/2021   Time: 10:30 AM    Patient Name: David Craig      Medical Record Number: [id number]  Date of Birth: 10/22/1985 Sex: Male         Room/Bed: 4W08C/4W08C-01 Payor Info: Payor: BLUE [employer] BLUE [employer]IELD / Plan: BCBS COMM PPO / Product Type: *No Product type* /    Admit Date/Time:  02/22/2021  3:23 PM  Primary Diagnosis:  Cerebrovascular accident (CVA) due to occlusion of right posterior cerebral artery [facility])  Hospital Problems: Principal Problem:   Cerebrovascular accident (CVA) due to occlusion of right posterior cerebral artery (HCC) Active Problems:   Hypoalbuminemia due to protein-calorie malnutrition (HCC)   Dyslipidemia   Hemiparesis affecting left side as late effect of stroke [facility])   Lethargy    Expected Discharge Date: Expected Discharge Date: 03/22/21  Team Members Present: Physician leading conference: Dr. Faith Rogue Social Worker Present: Cecile Sheerer, LCSWA Nurse Present: Kennyth Arnold, RN PT Present: Merry Lofty, PT OT Present: Kearney Hard, OT SLP Present: Feliberto Gottron, SLP PPS Coordinator present : Fae Pippin, SLP     Current Status/Progress Goal Weekly Team Focus  Bowel/Bladder   cont x 2  remain cont  consistant bowel frequency   Swallow/Nutrition/ Hydration   Regular textures with thin liquids, Supervision-Min A for use of swallow strategies  Mod I  use of swallowing compensatory strategies   ADL's   Min assist for UB selfcare with mod assist overall for LB selfcare.  Min assist for squat pivots with mod for stand pivots.  Brunnstrum stage II-III in the left arm but with stage I in the hand and no active movement noted.  Decreased selective attention secondary to internal and external distractions in the environment.  Still with impulsivity as well.  supervision to min, will need to be downgraded next PN to min assist level  selfcare retraining, transfer  training, DME education, balance retraining, neuromuscular re-education, NMES   Mobility   supervision bed mobility with bed features, minA/CGA stand pivot transfers, minA/modA gait x60' with RW and L hand splint  CGA  L hemibody NMR, family education, transfers, ambulation, WC training   Communication   Supervision-Mod I  Mod I  use of speech intelligibility strategies in a distracting/noisy enviornment   Safety/Cognition/ Behavioral Observations  Min-Mod A  Supervision  selective attention, left visual scanning, funcitonal problem solving, safety awareness, appropriate social interactions   Pain   pt denies pain  remain painfree  asses pain q shiftand prn   Skin   skin is intact  no new breakdown  asses skin qshift and prn     Discharge Planning:  Pt to discharge to home with intermittent support from his wife who works from home and cares for their 1 and 3 y.o. sons. PRN support from family that live close to their home.   Team Discussion: Working on sleep, adjusting medications. Left arm not coming around. Continent B/B. Pain is controlled. Has left side weakness. Tele-sitter discontinued. Behavior has improved. Progressing well. Will need W/C at discharge and AFO consult. Cognition improved. Working on visual scanning. Patient on target to meet rehab goals: yes, min/mod assist overall. Brunnstrum stage ll-lll in left arm but with stage I in the hand and no active movement noted.   *See Care Plan and progress notes for long and short-term goals.   Revisions to Treatment Plan:  Adjusting medications, working on sleep. Teaching Needs: Family education, medication management, pain  management, sleep management, behavior management, safety awareness, transfer training, gait training, balance training, endurance training, W/C training.  Current Barriers to Discharge: Decreased caregiver support, Medical stability, Home enviroment access/layout, Lack of/limited family support, Weight,  Medication compliance, and Behavior  Possible Resolutions to Barriers: Family education with spouse, continue current medications.     Medical Summary Current Status: slow motor progress LUE. pain controlled. not sleeping too well, adjusting seroquel. behavior improving  Barriers to Discharge: Medical stability   Possible Resolutions to [employer] Focus: daily assessment of labs and pt data. sleep-wake normalization   Continued Need for Acute Rehabilitation Level of Care: The patient requires daily medical management by a physician with specialized training in physical medicine and rehabilitation for the following reasons: Direction of a multidisciplinary physical rehabilitation program to maximize functional independence : Yes Medical management of patient stability for increased activity during participation in an intensive rehabilitation regime.: Yes Analysis of laboratory values and/or radiology reports with any subsequent need for medication adjustment and/or medical intervention. : Yes   I attest that I was present, lead the team conference, and concur with the assessment and plan of the team.   Tennis Must 03/13/2021, 3:44 PM

## 2021-03-13 NOTE — Progress Notes (Signed)
Patient ID: David Craig, male   DOB: 01-08-1986, 35 y.o.   MRN: [id number]  SW met with pt and pt wife in room to provide updates from team conference on gains made, and d/c date remains 10/20. SW discussed family education. Reports she will discuss with his parents when they are able to come in and will f/u. SW informed will provide updates once there is more information with regard to d/c recommendations.   Loralee Pacas, MSW, [place] Office: [phone] Cell: [phone] Fax: [phone]

## 2021-03-13 NOTE — Progress Notes (Signed)
Speech Language Pathology Daily Session Note  Patient Details  Name: David Craig MRN: [id number] Date of Birth: 25-Jul-1985  Today's Date: 03/13/2021 SLP Individual Time: 0900-1000 SLP Individual Time Calculation (min): 60 min  Short Term Goals: Week 3: SLP Short Term Goal 1 (Week 3): Patient will consume current diet with minimal overt s/s of aspiration with Min verbal cues for use of swallowing compensatory strategies. SLP Short Term Goal 2 (Week 3): Patient will utilize speech intelligibility strategies at the sentence level to improve articulatory precision with Supervision level verbal cues. SLP Short Term Goal 3 (Week 3): Patient will demosntrate sustained attention to functional tasks for 30 minutes with Mod verbal cues for redirection. SLP Short Term Goal 4 (Week 3): Patient will demonstrate functional problem solving for basic and familiar tasks with Min verbal cues. SLP Short Term Goal 5 (Week 3): Patient will utilize external memory aids to recall new, daily information with Supervision verbal cues. SLP Short Term Goal 6 (Week 3): Patient will self-montior and correct errors during functional tasks with Min A verbal cues.  Skilled Therapeutic Interventions: Skilled treatment session focused on cognitive goals. SLP facilitated session by utilizing a visual anchor to maximize left visual scanning during a single stimuli cancellation task. Patient only missed 3 out of the 105 occurences compared to 24 in previous session when a visual anchor was not utilized. SLP also facilitated session by providing supervision level verbal cues for error awareness during a multi paragraph reading task. SLP also with decreased speech intelligibility at the sentence level while reading aloud with Min verbal cues needed to self-monitor and correct. Patient attempted to remove shoes prior to transfer to the bed. SLP provided education regarding safety and need for shoes. Patient transferred back to bed and  left with alarm on and all needs within reach. Continue with current plan of care.       Pain No/Denies Pain   Therapy/Group: Individual Therapy  Lillianne Eick 03/13/2021, 10:18 AM

## 2021-03-13 NOTE — Progress Notes (Signed)
Physical Therapy Session Note  Patient Details  Name: David Craig MRN: [id number] Date of Birth: May 08, 1986  Today's Date: 03/13/2021 PT Individual Time: 0800-0857 PT Individual Time Calculation (min): 57 min   Short Term Goals: Week 2:  PT Short Term Goal 1 (Week 2): Pt will perform sit to stand consistently with minA. PT Short Term Goal 2 (Week 2): Pt will perform bed to chair consistently with minA. PT Short Term Goal 3 (Week 2): Pt will ambulate x50' with minA and LRAD. PT Short Term Goal 4 (Week 2): Pt will perform bed mobility consistently with minA.  Skilled Therapeutic Interventions/Progress Updates:     Pt supine in bed sleeping at start of session, wife at the bedside. He awakens to voice and is agreeable to PT tx but requires extra time fully awaken. Donned compression socks and shoes with totalA at bed level. Completes supine<>sit with CGA with bed features and donned L GRAFO with totalA at EOB. Stand<>pivot transfer with minA from EOB to w/c with cues for safety. RN arriving to deliver morning medications and then pt transported to day room rehab gym. Sit<>stand to RW from w/c height with CGA, heavy offloading of LLE noted. Gait training ~12ft + ~78ft with minA and RW - 2nd gait trial provided shoe cover for L foot to reduce friction of R toe drag. Noted R foot externally rotated and often "kicking" RW frame during swing phase. Next, worked on LLE strengthening at [employer]. Worked on stance phase control with unilateral step ups to 2inch block and then progressing to 4inch block, requiring minA for balance and minA for L knee stability - use back of arm chair for RUE support. MD arriving for morning rounds and then pt assisted to w/c via stand<>pivot transfer with minA. Transported back to his room and remained seated in w/c with all needs in reach and safety belt alarm on, wife remained at bedside.   Therapy Documentation Precautions:  Precautions Precautions: Fall Precaution  Comments: L hemi, L homonymous hemianopsia, R gaze preference Restrictions Weight Bearing Restrictions: No General:    Therapy/Group: Individual Therapy  Orrin Brigham 03/13/2021, 7:32 AM

## 2021-03-13 NOTE — Progress Notes (Signed)
Occupational Therapy Session Note  Patient Details  Name: David Craig MRN: [id number] Date of Birth: 1986/04/19  Today's Date: 03/13/2021 OT Individual Time: 1409-1500 OT Individual Time Calculation (min): 51 min    Short Term Goals: Week 3:  OT Short Term Goal 1 (Week 3): Pt will complete stand pivot transfers to the 3:1 with mod assist. OT Short Term Goal 2 (Week 3): Pt will use the LUE as a stabilizer during bathing tasks to hold the washcloth when applying soap at mod assis level with min instructional cueing. OT Short Term Goal 3 (Week 3): Pt will complete LB dressing to donn underpants and patns at min assist level for two consecutive sessions.  Skilled Therapeutic Interventions/Progress Updates:    Pt worked on [employer] for the LUE during session.  Took pt to the therapy gym with transfer into quadriped on the mat with mod assist.  Worked on weightbearing over the LUE with small forward weightshifts onto the left arm and max facilitation to avoid elbow flexion and to stabilize the shoulder joint.  He transitioned to side sitting with min assist for rest breaks as well as leaning back onto his knees.  Completed 3 intervals lasting 2-3  mins each before transitioning to sitting EOM.  He was able to complete small intervals of shoulder flexion with elbow extension in the LUE using slanted board with towel on it.  Mod assist was needed to push hand to target while keeping the hand from adducting secondary to increased tone at the pectoral.  Several intervals were completed with positioning of foam block toward the end of the board and pt instructed to push it off.  Finished session with min assist transfer to the wheelchair and pt left with the call button and phone in reach.    Therapy Documentation Precautions:  Precautions Precautions: Fall Precaution Comments: L hemi, L homonymous hemianopsia, R gaze preference Restrictions Weight Bearing Restrictions:  No  Pain: Pain Assessment Pain Scale: Faces Pain Score: 0-No pain ADL: See Care Tool Section for some details of mobility and selfcare   Therapy/Group: Individual Therapy  David Craig 03/13/2021, 3:59 PM

## 2021-03-13 NOTE — Progress Notes (Signed)
PROGRESS NOTE   Subjective/Complaints: Slept better last night. Kiddingly asked when his air mattress is coming.   ROS: Patient denies fever, rash, sore throat, blurred vision, nausea, vomiting, diarrhea, cough, shortness of breath or chest pain, joint or back pain, headache, or mood change.  .  Objective:   No results found. Recent Labs    03/12/21 0748  WBC 6.4  HGB 16.0  HCT 46.2  PLT 151    Recent Labs    03/12/21 0748  NA 141  K 3.9  CL 105  CO2 28  GLUCOSE 108*  BUN 13  CREATININE 0.74  CALCIUM 9.2     Intake/Output Summary (Last 24 hours) at 03/13/2021 1107 Last data filed at 03/13/2021 0900 Gross per 24 hour  Intake 450 ml  Output 200 ml  Net 250 ml         Physical Exam: Vital Signs Blood pressure 110/70, pulse 62, temperature 97.7 F (36.5 C), temperature source Oral, resp. rate 18, height 5\' 8"  (1.727 m), weight 98.7 kg, SpO2 95 %.    Constitutional: No distress . Vital signs reviewed. HEENT: NCAT, EOMI, oral membranes moist Neck: supple Cardiovascular: RRR without murmur. No JVD    Respiratory/Chest: CTA Bilaterally without wheezes or rales. Normal effort    GI/Abdomen: BS +, non-tender, non-distended Ext: no clubbing, cyanosis, or edema Psych: still somewhat flat Skin: Warm and dry.  Intact. Psych: Normal mood.  Normal behavior. Musc: No edema in extremities.  No tenderness in extremities. Neuro: Alert and oriented x3. Mild dysarthria.  Left facial weakness Motor: LUE 1/5 delt,pec, 0/5 distally. LLE 2- to 2/5 HF, KE--no changed. Mild flexor tone LUE in finger flexor particularly  Assessment/Plan: 1. Functional deficits which require 3+ hours per day of interdisciplinary therapy in a comprehensive inpatient rehab setting. Physiatrist is providing close team supervision and 24 hour management of active medical problems listed below. Physiatrist and rehab team continue to assess  barriers to discharge/monitor patient progress toward functional and medical goals  Care Tool:  Bathing    Body parts bathed by patient: Left arm, Chest, Abdomen, Front perineal area, Right upper leg, Left upper leg, Face, Right lower leg, Buttocks   Body parts bathed by helper: Left lower leg, Right arm     Bathing assist Assist Level: Minimal Assistance - Patient > 75%     Upper Body Dressing/Undressing Upper body dressing   What is the patient wearing?: Pull over shirt    Upper body assist Assist Level: Minimal Assistance - Patient > 75%    Lower Body Dressing/Undressing Lower body dressing      What is the patient wearing?: Underwear/pull up, Pants     Lower body assist Assist for lower body dressing: Moderate Assistance - Patient 50 - 74%     Toileting Toileting    Toileting assist Assist for toileting: Moderate Assistance - Patient 50 - 74% Assistive Device Comment: urinal   Transfers Chair/bed transfer  Transfers assist     Chair/bed transfer assist level: Minimal Assistance - Patient > 75%     Locomotion Ambulation   Ambulation assist      Assist level: Maximal Assistance - Patient 25 -  49% Assistive device: Walker-rolling Max distance: 20   Walk 10 feet activity   Assist     Assist level: Maximal Assistance - Patient 25 - 49% Assistive device: Walker-rolling   Walk 50 feet activity   Assist Walk 50 feet with 2 turns activity did not occur: Safety/medical concerns         Walk 150 feet activity   Assist Walk 150 feet activity did not occur: Safety/medical concerns         Walk 10 feet on uneven surface  activity   Assist Walk 10 feet on uneven surfaces activity did not occur: Safety/medical concerns         Wheelchair     Assist Is the patient using a wheelchair?: Yes Type of Wheelchair: Manual    Wheelchair assist level: Contact Guard/Touching assist Max wheelchair distance: 100    Wheelchair 50 feet  with 2 turns activity    Assist        Assist Level: Contact Guard/Touching assist   Wheelchair 150 feet activity     Assist      Assist Level: Dependent - Patient 0%   Blood pressure 110/70, pulse 62, temperature 97.7 F (36.5 C), temperature source Oral, resp. rate 18, height 5\' 8"  (1.727 m), weight 98.7 kg, SpO2 95 %.  Medical Problem List and Plan: 1.  Left-sided hemiparesis secondary to right PCA infarction secondary to terminal right ICA occlusion status post failed mechanical thrombectomy etiology likely secondary to cocaine use/vasospasm  WHO/PRAFO    -Continue CIR therapies including PT, OT, and SLP. Interdisciplinary team conference today to discuss goals, barriers to discharge, and dc planning.      -early tone, continue ROM, splinting, slow motor return LUE 2.  Antithrombotics: -DVT/anticoagulation: Lower extremity Dopplers negative Pharmaceutical: Lovenox             -antiplatelet therapy: Aspirin 81 mg daily and Plavix 75 mg daily x90 days then aspirin alone 3. Pain Management: Tylenol as needed  Discussed with pt again. Pt could bring in foam overlay potentially  - kpad for back 4. Mood/sleep-wake: Provide emotional support  -scheduled seroquel at HS for sleep and irritability--increase to 100mg              -antipsychotic agents: seroquel   -added nicotine patch for cigarette craving  -amantadine for day time arousal   5. Neuropsych: This patient is capable of making decisions on his own behalf. 6. Skin/Wound Care: Routine skin checks. Encourage appropriate nutrition 7. Fluids/Electrolytes/Nutrition: eating well  Eating well 8.  Polysubstance abuse.  Provide counseling 9.  Hyperlipidemia: Lipitor 10.  Obesity.  BMI 34.97.  Dietary follow-up 11. Post-stroke dysphagia:  advanced to regular diet, tolerating 12. Hypoalbuminemia  Supplement initiated on 9/30  LOS: 19 days A FACE TO FACE EVALUATION WAS PERFORMED  03/13/2021, 11:07 AM

## 2021-03-13 NOTE — Progress Notes (Signed)
Orthopedic Tech Progress Note Patient Details:  David Craig 27-Aug-1985 [id number]  Patient ID: David Craig, male   DOB: 10-02-1985, 35 y.o.   MRN: [id number] Called order into [facility].  [place] A Shraddha Lebron 03/13/2021, 11:06 AM

## 2021-03-14 DIAGNOSIS — R739 Hyperglycemia, unspecified: Secondary | ICD-10-CM

## 2021-03-14 NOTE — Progress Notes (Signed)
Speech Language Pathology Daily Session Note  Patient Details  Name: Jesus Nevills MRN: [id number] Date of Birth: [birth year]  Today's Date: 03/14/2021 SLP Individual Time: 1300-1400 SLP Individual Time Calculation (min): 60 min  Short Term Goals: Week 3: SLP Short Term Goal 1 (Week 3): Patient will consume current diet with minimal overt s/s of aspiration with Min verbal cues for use of swallowing compensatory strategies. SLP Short Term Goal 2 (Week 3): Patient will utilize speech intelligibility strategies at the sentence level to improve articulatory precision with Supervision level verbal cues. SLP Short Term Goal 3 (Week 3): Patient will demosntrate sustained attention to functional tasks for 30 minutes with Mod verbal cues for redirection. SLP Short Term Goal 4 (Week 3): Patient will demonstrate functional problem solving for basic and familiar tasks with Min verbal cues. SLP Short Term Goal 5 (Week 3): Patient will utilize external memory aids to recall new, daily information with Supervision verbal cues. SLP Short Term Goal 6 (Week 3): Patient will self-montior and correct errors during functional tasks with Min A verbal cues.  Skilled Therapeutic Interventions: Skilled treatment session focused on cognitive goals. SLP facilitated session by providing Min A verbal cues for error awareness during a complex calendar task in which patient had to write appointments on a calendar. Patient was overall Mod I for problem solving but required Mod verbal cues for left visual scanning without use of a visual anchor. Patient also participated in a complex scheduling task and required Min-Mod A verbal cues for problem solving and organization. Patient left upright in wheelchair with alarm on, mom present and all needs within reach. Continue with current plan of care.      Pain No/Denies Pain   Therapy/Group: Individual Therapy  Delon Revelo 03/14/2021, 2:20 PM

## 2021-03-14 NOTE — Progress Notes (Signed)
PROGRESS NOTE   Subjective/Complaints: Patient seen sitting up in his chair this morning.  He states he slept well overnight, confirmed with sleep chart.  "Just want my left side to work".  ROS: Denies CP, SOB, N/V/D  Objective:   No results found. Recent Labs    03/12/21 0748  WBC 6.4  HGB 16.0  HCT 46.2  PLT 151     Recent Labs    03/12/21 0748  NA 141  K 3.9  CL 105  CO2 28  GLUCOSE 108*  BUN 13  CREATININE 0.74  CALCIUM 9.2      Intake/Output Summary (Last 24 hours) at 03/14/2021 1058 Last data filed at 03/13/2021 2231 Gross per 24 hour  Intake 360 ml  Output 320 ml  Net 40 ml          Physical Exam: Vital Signs Blood pressure 114/79, pulse 69, temperature 98 F (36.7 C), temperature source Oral, resp. rate 18, height 5\' 8"  (1.727 m), weight 98.7 kg, SpO2 96 %. Constitutional: No distress . Vital signs reviewed. HENT: Normocephalic.  Atraumatic. Eyes: EOMI. No discharge. Cardiovascular: No JVD.  RRR. Respiratory: Normal effort.  No stridor.  Bilateral clear to auscultation. GI: Non-distended.  BS +. Skin: Warm and dry.  Intact. Psych: Normal mood.  Normal behavior. Musc: No edema in extremities.  No tenderness in extremities. Neuro: Alert and oriented Dysarthria Left facial weakness Motor: LUE: Shoulder abduction 1+/5, distally 0/5  LLE: Hip flexion, knee extension 3/5, distally 0/5   Assessment/Plan: 1. Functional deficits which require 3+ hours per day of interdisciplinary therapy in a comprehensive inpatient rehab setting. Physiatrist is providing close team supervision and 24 hour management of active medical problems listed below. Physiatrist and rehab team continue to assess barriers to discharge/monitor patient progress toward functional and medical goals  Care Tool:  Bathing    Body parts bathed by patient: Left arm, Chest, Abdomen, Front perineal area, Right upper leg,  Left upper leg, Face, Right lower leg, Buttocks   Body parts bathed by helper: Left lower leg, Right arm     Bathing assist Assist Level: Minimal Assistance - Patient > 75%     Upper Body Dressing/Undressing Upper body dressing   What is the patient wearing?: Pull over shirt    Upper body assist Assist Level: Minimal Assistance - Patient > 75%    Lower Body Dressing/Undressing Lower body dressing      What is the patient wearing?: Underwear/pull up, Pants     Lower body assist Assist for lower body dressing: Moderate Assistance - Patient 50 - 74%     Toileting Toileting    Toileting assist Assist for toileting: Moderate Assistance - Patient 50 - 74% Assistive Device Comment: urinal   Transfers Chair/bed transfer  Transfers assist     Chair/bed transfer assist level: Minimal Assistance - Patient > 75%     Locomotion Ambulation   Ambulation assist      Assist level: Maximal Assistance - Patient 25 - 49% Assistive device: No Device Max distance: 10'   Walk 10 feet activity   Assist     Assist level: Maximal Assistance - Patient 25 - 49% Assistive  device: Walker-rolling   Walk 50 feet activity   Assist Walk 50 feet with 2 turns activity did not occur: Safety/medical concerns         Walk 150 feet activity   Assist Walk 150 feet activity did not occur: Safety/medical concerns         Walk 10 feet on uneven surface  activity   Assist Walk 10 feet on uneven surfaces activity did not occur: Safety/medical concerns         Wheelchair     Assist Is the patient using a wheelchair?: Yes Type of Wheelchair: Manual    Wheelchair assist level: Contact Guard/Touching assist Max wheelchair distance: 100    Wheelchair 50 feet with 2 turns activity    Assist        Assist Level: Contact Guard/Touching assist   Wheelchair 150 feet activity     Assist      Assist Level: Dependent - Patient 0%   Blood pressure  114/79, pulse 69, temperature 98 F (36.7 C), temperature source Oral, resp. rate 18, height 5\' 8"  (1.727 m), weight 98.7 kg, SpO2 96 %.  Medical Problem List and Plan: 1.  Left-sided hemiparesis secondary to right PCA infarction secondary to terminal right ICA occlusion status post failed mechanical thrombectomy etiology likely secondary to cocaine use/vasospasm  Continue CIR  WHO/PRAFO, continue ROM, splinting, slow motor return LUE 2.  Antithrombotics: -DVT/anticoagulation: Lower extremity Dopplers negative Pharmaceutical: Lovenox             -antiplatelet therapy: Aspirin 81 mg daily and Plavix 75 mg daily x90 days then aspirin alone 3. Pain Management: Tylenol as needed  Discussed with pt again. Pt could bring in foam overlay potentially  - kpad for back  Appears controlled on 10/12 4. Mood/sleep-wake: Provide emotional support  -scheduled seroquel at HS for sleep and irritability--increased to 100mg              -antipsychotic agents: seroquel   -added nicotine patch for cigarette craving  -amantadine for day time arousal    Improving 5. Neuropsych: This patient is capable of making decisions on his own behalf. 6. Skin/Wound Care: Routine skin checks. Encourage appropriate nutrition 7. Fluids/Electrolytes/Nutrition: eating well 8.  Polysubstance abuse.  Provide counseling 9.  Hyperlipidemia: [place] 10.  Obesity.  BMI 34.97.  Dietary follow-up 11. Post-stroke dysphagia:  advanced to regular diet, tolerating 12. Hypoalbuminemia  Supplement initiated on 9/30 13.  Hyperglycemia  Likely stress-induced, continue to monitor  LOS: 20 days A FACE TO FACE EVALUATION WAS PERFORMED  Vika Buske 03/14/2021, 10:58 AM

## 2021-03-14 NOTE — Progress Notes (Signed)
Physical Therapy Weekly Progress Note  Patient Details  Name: David Craig MRN: [id number] Date of Birth: April 14, 1986  Beginning of progress report period: March 05, 2021 End of progress report period: March 14, 2021  Today's Date: 03/14/2021 PT Individual Time: 1416-1530 PT Individual Time Calculation (min): 74 min   Patient has met 3 of 4 short term goals. Pt is progressing well toward mobility goals, improving independence with bed mobility, balance, transfers, and ambulation. Pt routinely performing bed mobility with supervision but requires hospital bed features. Pt able to transfer with CGA to minA, depending on fatigue and motor planning, and is ambulating up to 100' with RW and minA, with use of L hand splint and flexion assist for L lower extremity. Pt has family education scheduled for coming week and is on track for discharge at scheduled date of 10/20.  Patient continues to demonstrate the following deficits muscle weakness, decreased cardiorespiratoy endurance, decreased coordination and decreased motor planning, decreased attention to left, and decreased sitting balance, decreased standing balance, decreased postural control, hemiplegia, and decreased balance strategies and therefore will continue to benefit from skilled PT intervention to increase functional independence with mobility.  Patient progressing toward long term goals..  Continue plan of care.  PT Short Term Goals Week 2:  PT Short Term Goal 1 (Week 2): Pt will perform sit to stand consistently with minA. PT Short Term Goal 1 - Progress (Week 2): Met PT Short Term Goal 2 (Week 2): Pt will perform bed to chair consistently with minA. PT Short Term Goal 2 - Progress (Week 2): Met PT Short Term Goal 3 (Week 2): Pt will ambulate x50' with minA and LRAD. PT Short Term Goal 3 - Progress (Week 2): Met PT Short Term Goal 4 (Week 2): Pt will perform bed mobility consistently with minA. PT Short Term Goal 4 - Progress  (Week 2): Progressing toward goal Week 3:  PT Short Term Goal 1 (Week 3): STGs = LTGs  Skilled Therapeutic Interventions/Progress Updates:     Pt received supine in bed and agrees to therapy. No complaint of pain. Supine to sit with bed features and cues for positioning at EOB. Sit to stand and stand pivot transfer to [facility] with CGA and cues for sequencing and positioning. WC transport to gym for time management. Pt performs stand pivot transfer to mat table with CGA and cues for body mechanics. Pt perform multiple reps of sit to stand during session with close supervision to CGA, depending on fatigue and motor planning. Pt tasked with performing R leg lifts without AD and with PT not providing assistance for weight shifting, balance, or blocking of L knee. On first attempt pt loses balance backward and sits abruptly back on mat. PT cues for sequencing and motor planning and on subsequent rep pt is able to complete without LOB. Pt performs multiple bouts of x10 R leg lifts without physical assistance required. Pt progresses to performing R toe taps with weight shifting onto 3 inch platform with R leg. Pt again initially loses balance backward on first attempt but with cueing and practice is able to improve and complete without physical assistance. At times pt does require minA for assistance weight shifting and correcting posterior bias, but with improved motor planning completes with CGA.   Pt trials ambulation with giv-mohr sling. Without AD, pt ambulates x20' with minA/modA and cues for lateral weight shifting and contraction of L quad during stance phase. Pt noted to be compensating with L hip adductors for  progression of L leg during stance phase. During rest break, PT ties red theraband around L lower extremity for flexion assistance. Pt then ambulates x100' with RW and L hand splint, with PT providing minA for RW management and occasional stability due to fatigue in L leg.   Pt transport back to room  via [place]. Left seated in WC with alarm intact and all needs within reach.  Therapy Documentation Precautions:  Precautions Precautions: Fall Precaution Comments: L hemi, L homonymous hemianopsia, R gaze preference Restrictions Weight Bearing Restrictions: No  Therapy/Group: Individual Therapy  Breck Coons, PT, DPT 03/14/2021, 3:54 PM

## 2021-03-14 NOTE — Progress Notes (Signed)
Occupational Therapy Session Note  Patient Details  Name: David Craig MRN: [id number] Date of Birth: 1985-09-14  Today's Date: 03/14/2021 OT Individual Time: 0802-0901 OT Individual Time Calculation (min): 59 min    Short Term Goals: Week 3:  OT Short Term Goal 1 (Week 3): Pt will complete stand pivot transfers to the 3:1 with mod assist. OT Short Term Goal 2 (Week 3): Pt will use the LUE as a stabilizer during bathing tasks to hold the washcloth when applying soap at mod assis level with min instructional cueing. OT Short Term Goal 3 (Week 3): Pt will complete LB dressing to donn underpants and patns at min assist level for two consecutive sessions.  Skilled Therapeutic Interventions/Progress Updates:    Pt in bed to start with agreement to complete selfcare tasks during session.  Mod assist for supine to sit with max assist for ambulation to the toilet without assistive device.  He was able to complete toileting tasks with min assist sit to stand.  He needed mod assist for removal of LB clothing with transfer over to the tub bench at mod assist after toileting.  He was able to complete bathing at min assist sit to stand with max hand over hand for incorporation of the LUE to wash the right arm.  Did educate pt on hemi technique for bathing and he was able to return demonstrate.  Min assist for sit to stand to wash peri area with completion in sitting with left lateral lean for thoroughness.  He was able to complete transfer squat pivot to the right to the wheelchair at min assist.  He completed donning underpants and pants with mod assist.  Donning pullover shirt was completed at mod assist as well secondary to impulsivity and pt not following hemi techniques.  Finished with therapist assisting with donning gripper socks and application of NMES to the left shoulder.  He was able to tolerate for 60 mins on level 8 intensity for treatment of subluxation and for increased sensory input.  See below  for parameters.  Call button and phone in reach with safety alarm in place.  See below for parameters of NMES Saebo Stim One 330 pulse width 35 Hz pulse rate On 8 sec/ off 8 sec Ramp up/ down 2 sec Symmetrical Biphasic wave form  Max intensity at 500 Ohm load   Therapy Documentation Precautions:  Precautions Precautions: Fall Precaution Comments: L hemi, L homonymous hemianopsia, R gaze preference Restrictions Weight Bearing Restrictions: No   Pain: Pain Assessment Pain Scale: Faces Pain Score: 0-No pain ADL: See Care Tool Section for some details of mobility and selfcare  Therapy/Group: Individual Therapy  Aerion Bagdasarian OTR/L 03/14/2021, 10:39 AM

## 2021-03-15 MED ORDER — MELATONIN 3 MG PO TABS
3.0000 mg | ORAL_TABLET | Freq: Every day | ORAL | Status: DC
  Administered 2021-03-15 – 2021-03-21 (×7): 3 mg via ORAL
  Filled 2021-03-15 (×7): qty 1

## 2021-03-15 MED ORDER — QUETIAPINE FUMARATE 50 MG PO TABS
100.0000 mg | ORAL_TABLET | Freq: Every day | ORAL | Status: DC
  Administered 2021-03-15 – 2021-03-21 (×7): 100 mg via ORAL
  Filled 2021-03-15 (×7): qty 2

## 2021-03-15 NOTE — Progress Notes (Signed)
Physical Therapy Session Note  Patient Details  Name: David Craig MRN: [id number] Date of Birth: 02-19-1986  Today's Date: 03/15/2021 PT Individual Time: 1300-1400 PT Individual Time Calculation (min): 60 min   Short Term Goals: Week 3:  PT Short Term Goal 1 (Week 3): STGs = LTGs  Skilled Therapeutic Interventions/Progress Updates:     Pt supine in bed to start session with wife at the bedside. Pt agreeable to PT tx and reports no pain. Focus of session to review bracing and AFO needs for his paretic LLE in collaboration with [facility] - [place], [place]. Supine<>sit completed with minA, primarily for trunk management. Donned shoes with totalA at EOB for time, without GRAFO brace. Stand<>pivot transfer with minA from EOB to w/c with good understanding of general sequencing. Transported in w/c to main rehab gym for time and energy conservation. Completed sit<>stand to RW with CGA/minA and RW - requires assist for stabilizing LUE while strapping in the RW orthotic. Ambulated ~86ft with minA and RW without any bracing, fair hip flexion with foot clearance and some L knee instability present - less compensation from adductors to swing LLE compared to prior sessions. Determined from gait deficits and with [facility] collaboration that either an [place] PLS vs foot-up brace would be recommended for bracing. Retrieved foot-up brace and this was donned on his shoe with totalA. He ambulated ~50ft with minA and RW - similar deficits noted as above but with improved L foot clearance. In ADL apartment, completed furniture transfers to low sitting sofa couch with minA and RW. Then, worked on isolated L hip flexion on mat table in the rehab gym while having pt lay supine with LLE on large therapy ball - completed 1x5 + 1x10 + 1x20 reps, requiring minA for knee alignment although pt able to complete full L hip flexion ROM in antigravity movement which pt was pleased about. Assisted back to his w/c via stand<>pivot transfer  with minA and then returned to room and assisted back to bed in similar manner. MinA for sit>supine for LLE management. Bed alarm on, all needs in reach, wife at bedside at completion of session.   Therapy Documentation Precautions:  Precautions Precautions: Fall Precaution Comments: L hemi, L homonymous hemianopsia, R gaze preference Restrictions Weight Bearing Restrictions: No General:    Therapy/Group: Individual Therapy  Orrin Brigham 03/15/2021, 7:35 AM

## 2021-03-15 NOTE — Progress Notes (Signed)
Physical Therapy Session Note  Patient Details  Name: David Craig MRN: [id number] Date of Birth: 1986-02-04  Today's Date: 03/15/2021 PT Individual Time: 0831-0930 PT Individual Time Calculation (min): 59 min   Short Term Goals: Week 3:  PT Short Term Goal 1 (Week 3): STGs = LTGs  Skilled Therapeutic Interventions/Progress Updates: Pt presents supine in bed and agreeable to therapy.  Pt transfers to L sidelying and then min A for sidelying to sit.  Pt sat EOB w/o UE support for attempt to don R sock, but unable to perform even w/ attempt at [place] position.  PT performed total A for socks and shoes w/ AFO to L LE.  Pt transferred sit to stand w/ min A and then SPT to w/c on Right w/ improved step pivot.  Pt wheeled to main gym for time conservation.  Pt performed sit to stand w/ min A 3 x 10 w/o UEs.  Pt performed standing toe taps to 1 3/4" platform and then 6" platform 3 x 10 w/ RLE only.  Minimal facilitation at Left knee and use of RW.  Pt performed multiple sit to stand to perform all sequencing for safety - LLE placement, forward lean, placement of L hand in ortho assist, and then in reverse w/ 80% appropriate performance.  Pt amb x  12' multiple trials w/ RW and min A including turns to seat.  Pt returned to room and amb into room and to bed w/ RW and min A.  LLE scuffs , unable to clear foot.  Pt transfers sit to supine w/ min A for LLE only, but able to bridge to scoot butt into center of bed.  Bed alarm on and all needs in reach.     Therapy Documentation Precautions:  Precautions Precautions: Fall Precaution Comments: L hemi, L homonymous hemianopsia, R gaze preference Restrictions Weight Bearing Restrictions: No General:   Vital Signs:  Pain:0/10      Therapy/Group: Individual Therapy  [name] 03/15/2021, 9:30 AM

## 2021-03-15 NOTE — Progress Notes (Signed)
Occupational Therapy Session Note  Patient Details  Name: David Craig MRN: [id number] Date of Birth: 1985-12-21  Today's Date: 03/15/2021 OT Individual Time: 1115-1200 OT Individual Time Calculation (min): 45 min    Short Term Goals: Week 1:  OT Short Term Goal 1 (Week 1): Pt will complete BSC or toilet transfer with Max A without Stedy OT Short Term Goal 1 - Progress (Week 1): Met OT Short Term Goal 2 (Week 1): Pt will complete LB dressing with no more than Mod balance assistance in standing OT Short Term Goal 2 - Progress (Week 1): Not met OT Short Term Goal 3 (Week 1): Pt will complete UB dressing with Mod A using hemi techniques OT Short Term Goal 3 - Progress (Week 1): Met  Skilled Therapeutic Interventions/Progress Updates:     Pt received in bed with intermittent shoulder pain in different ranges and repositioned for comfort.  Neuromuscular Reeducation Pt completes supine>sitting wit VC for slowing pace. Pt completes semi-stand pivot transfer with MIN A with VC for foot placement prior to transfer d/t L inattention. Same transfer to EOM in tx gym. Pt completes supine NMR of shoulder flex/ext scap pro/retraction with mod manual resistance but no activation at wrist. Pt able to flext elbow with gravity/control to keep from hitting face with shoulder flexed at 80* and extend elbow against gravity with min manual resistance. Pt completes seated chair rocking with facilitation of grasp on chair and scap mobilization for NMR/tactile cuing to draw shoulder down and back  Sit>squat with CGA and VC for WB into stool in front of patient as well as shifting weight equally over BLE as pt with R bias weight shift without cuing. Pt completes 2x10 for deep proprioceptive input for NMR.   Saebo Stim One 330 pulse width 35 Hz pulse rate On 8 sec/ off 8 sec Ramp up/ down 2 sec Symmetrical Biphasic wave form  Max intensity 144m at 500 Ohm load STIM to deltoid with good contraction on level 9  and skin in tact upon retrieval with no adverse reactions.   Pt left at end of session in bed with exit alarm on, call light in reach and all needs met   Therapy Documentation Precautions:  Precautions Precautions: Fall Precaution Comments: L hemi, L homonymous hemianopsia, R gaze preference Restrictions Weight Bearing Restrictions: No   Therapy/Group: Individual Therapy  STonny Branch10/13/2022, 7:09 AM

## 2021-03-15 NOTE — Progress Notes (Signed)
Speech Language Pathology Daily Session Note  Patient Details  Name: David Craig MRN: [id number] Date of Birth: 02-20-1986  Today's Date: 03/15/2021 SLP Individual Time: 1005-1050 SLP Individual Time Calculation (min): 45 min  Short Term Goals: Week 3: SLP Short Term Goal 1 (Week 3): Patient will consume current diet with minimal overt s/s of aspiration with Min verbal cues for use of swallowing compensatory strategies. SLP Short Term Goal 2 (Week 3): Patient will utilize speech intelligibility strategies at the sentence level to improve articulatory precision with Supervision level verbal cues. SLP Short Term Goal 3 (Week 3): Patient will demosntrate sustained attention to functional tasks for 30 minutes with Mod verbal cues for redirection. SLP Short Term Goal 4 (Week 3): Patient will demonstrate functional problem solving for basic and familiar tasks with Min verbal cues. SLP Short Term Goal 5 (Week 3): Patient will utilize external memory aids to recall new, daily information with Supervision verbal cues. SLP Short Term Goal 6 (Week 3): Patient will self-montior and correct errors during functional tasks with Min A verbal cues.  Skilled Therapeutic Interventions: Skilled treatment session focused on cognitive goals. SLP facilitated session by providing overall Min A verbal and visual cues for left visual scanning while reading books aloud that he would often read to his children at home. SLP also provided overall Mod A verbal cues for appropriate change in prosody and intonation while reading aloud. Patient left upright in the wheelchair with alarm on, wife present and all needs within reach. Continue with current plan of care.      Pain No/Denies Pain   Therapy/Group: Individual Therapy  Emalyn Schou 03/15/2021, 3:42 PM

## 2021-03-15 NOTE — Progress Notes (Signed)
Patient ID: David Craig, male   DOB: 1986/06/01, 35 y.o.   MRN: [id number]  SW returned phone call to pt wife David Craig [phone]) who was inquiring about d/c recs. SW informed waiting on d/c recs from team. She wanted to know if a ramp is needed. SW informed will discuss in more detail with team members and will f/u. Fam edu scheduled Monday (10/17) 9am-12pm with family.  Cecile Sheerer, MSW, LCSWA Office: [phone] Cell: [phone] Fax: [phone]

## 2021-03-15 NOTE — Progress Notes (Signed)
PROGRESS NOTE   Subjective/Complaints: Says he slept but that he couldn't fall asleep until 12-1. Slow to awaken this morning. Denies pain. Wife says he used to be up early for work  ROS: Patient denies fever, rash, sore throat, blurred vision, nausea, vomiting, diarrhea, cough, shortness of breath or chest pain, joint or back pain, headache, or mood change.   Objective:   No results found. No results for input(s): WBC, HGB, HCT, PLT in the last 72 hours.  No results for input(s): NA, K, CL, CO2, GLUCOSE, BUN, CREATININE, CALCIUM in the last 72 hours.   Intake/Output Summary (Last 24 hours) at 03/15/2021 0918 Last data filed at 03/15/2021 0640 Gross per 24 hour  Intake --  Output 580 ml  Net -580 ml         Physical Exam: Vital Signs Blood pressure (!) 141/73, pulse 70, temperature 97.9 F (36.6 C), resp. rate 18, height 5\' 8"  (1.727 m), weight 98.7 kg, SpO2 95 %. Constitutional: No distress . Vital signs reviewed. HEENT: NCAT, EOMI, oral membranes moist Neck: supple Cardiovascular: RRR without murmur. No JVD    Respiratory/Chest: CTA Bilaterally without wheezes or rales. Normal effort    GI/Abdomen: BS +, non-tender, non-distended Ext: no clubbing, cyanosis, or edema Psych: flat but cooperative  Musc: No edema in extremities.  No tenderness in extremities. Neuro: Alert and oriented Dysarthria Left facial weakness still present Motor: LUE: Shoulder abduction and pecs 1+/5, distally 0/5  LLE: Hip flexion, knee extension 3/5, distally 0/5 , trace flexor tone LUE  Assessment/Plan: 1. Functional deficits which require 3+ hours per day of interdisciplinary therapy in a comprehensive inpatient rehab setting. Physiatrist is providing close team supervision and 24 hour management of active medical problems listed below. Physiatrist and rehab team continue to assess barriers to discharge/monitor patient progress toward  functional and medical goals  Care Tool:  Bathing    Body parts bathed by patient: Left arm, Chest, Abdomen, Front perineal area, Right upper leg, Left upper leg, Face, Right lower leg, Buttocks   Body parts bathed by helper: Left lower leg, Right arm     Bathing assist Assist Level: Minimal Assistance - Patient > 75%     Upper Body Dressing/Undressing Upper body dressing   What is the patient wearing?: Pull over shirt    Upper body assist Assist Level: Minimal Assistance - Patient > 75%    Lower Body Dressing/Undressing Lower body dressing      What is the patient wearing?: Underwear/pull up, Pants     Lower body assist Assist for lower body dressing: Moderate Assistance - Patient 50 - 74%     Toileting Toileting    Toileting assist Assist for toileting: Moderate Assistance - Patient 50 - 74% Assistive Device Comment: urinal   Transfers Chair/bed transfer  Transfers assist     Chair/bed transfer assist level: Minimal Assistance - Patient > 75%     Locomotion Ambulation   Ambulation assist      Assist level: Maximal Assistance - Patient 25 - 49% Assistive device: No Device Max distance: 10'   Walk 10 feet activity   Assist     Assist level: Maximal Assistance -  Patient 25 - 49% Assistive device: Walker-rolling   Walk 50 feet activity   Assist Walk 50 feet with 2 turns activity did not occur: Safety/medical concerns         Walk 150 feet activity   Assist Walk 150 feet activity did not occur: Safety/medical concerns         Walk 10 feet on uneven surface  activity   Assist Walk 10 feet on uneven surfaces activity did not occur: Safety/medical concerns         Wheelchair     Assist Is the patient using a wheelchair?: Yes Type of Wheelchair: Manual    Wheelchair assist level: Contact Guard/Touching assist Max wheelchair distance: 100    Wheelchair 50 feet with 2 turns activity    Assist        Assist  Level: Contact Guard/Touching assist   Wheelchair 150 feet activity     Assist      Assist Level: Dependent - Patient 0%   Blood pressure (!) 141/73, pulse 70, temperature 97.9 F (36.6 C), resp. rate 18, height 5\' 8"  (1.727 m), weight 98.7 kg, SpO2 95 %.  Medical Problem List and Plan: 1.  Left-sided hemiparesis secondary to right PCA infarction secondary to terminal right ICA occlusion status post failed mechanical thrombectomy etiology likely secondary to cocaine use/vasospasm  -Continue CIR therapies including PT, OT, and SLP  WHO/PRAFO, continue ROM, splinting, slow motor return LUE 2.  Antithrombotics: -DVT/anticoagulation: Lower extremity Dopplers negative Pharmaceutical: Lovenox             -antiplatelet therapy: Aspirin 81 mg daily and Plavix 75 mg daily x90 days then aspirin alone 3. Pain Management: Tylenol as needed  Discussed with pt again. Pt could bring in foam overlay potentially  - kpad for back  Appears controlled on 10/13 4. Mood/sleep-wake: Provide emotional support  -scheduled seroquel at HS for sleep and irritability--increased to 100mg --will change timing to 2000 to help him fall asleep earlier   -melatonin also             -antipsychotic agents: seroquel   -added nicotine patch for cigarette craving  -amantadine for day time arousal      5. Neuropsych: This patient is capable of making decisions on his own behalf. 6. Skin/Wound Care: Routine skin checks. Encourage appropriate nutrition 7. Fluids/Electrolytes/Nutrition: eating well 8.  Polysubstance abuse.  Provide counseling 9.  Hyperlipidemia: [place] 10.  Obesity.  BMI 34.97.  Dietary follow-up 11. Post-stroke dysphagia:  advanced to regular diet, tolerating 12. Hypoalbuminemia  Supplement initiated on 9/30 13.  Hyperglycemia  Likely stress-induced, continue to monitor  LOS: 21 days A FACE TO FACE EVALUATION WAS PERFORMED  03/15/2021, 9:18 AM

## 2021-03-16 NOTE — Progress Notes (Signed)
Physical Therapy Session Note  Patient Details  Name: David Craig MRN: [id number] Date of Birth: 17-Oct-1985  Today's Date: 03/16/2021 PT Individual Time: 3917-9217 PT Individual Time Calculation (min): 27 min   Short Term Goals:  Week 3:  PT Short Term Goal 1 (Week 3): STGs = LTGs  Skilled Therapeutic Interventions/Progress Updates:   Pt received supine in bed and agreeable to PT. Supine>sit transfer with min assist at trunk. PT assisted to don shoes sitting EOB with Foot up brace in place. Stand pivot transfer to [facility] with CGA and UE supported on arm rest.   Attempted WC mobility but unable to due poor traction on the sole of the RLE.   Gait training with RW x 150f with min assist overall and moderate cues for improved trunk and hip extension with WB through the LLE as well as visual scanning to the R to avoid obstacles.   Pt returned to room and performed stand pivot transfer to bed with min assist fort LLE position. Sit>supine completed with supervision assist, and left supine in bed with call bell in reach and all needs met.         Therapy Documentation Precautions:  Precautions Precautions: Fall Precaution Comments: L hemi, L homonymous hemianopsia, R gaze preference Restrictions Weight Bearing Restrictions: No    Pain: denies   Therapy/Group: Individual Therapy  ALorie Phenix10/14/2022, 9:48 AM

## 2021-03-16 NOTE — Progress Notes (Signed)
Occupational Therapy Weekly Progress Note  Patient Details  Name: David Craig MRN: [id number] Date of Birth: 1985-06-06  Beginning of progress report period: March 10, 2021 End of progress report period: March 16, 2021  Today's Date: 03/16/2021 OT Individual Time: 1000-1100 OT Individual Time Calculation (min): 60 min    Patient has met 2 of 3 short term goals.  David Craig continues to make steady progress with OT at this time.  He is completing UB selfcare with overall supervision with min guard assist following hemi dressing techniques.  LB bathing is completed at min assist sit to stand with LB dressing at mod assist.  He completes squat pivot transfers with min assist and stand pivots with mod assist.  He continues with limited LUE functional use and movement.  Brunnstrum stage I in the left hand with stage II in the arm.  Slight increased tone is noted in the pectoral as well with the arm tending to adduct with attempted shoulder flexion movements.  He continues with impulsivity as well as left visual field deficit.  Family education is scheduled for 10/17 with recommendation for continued CIR level therapy until expected discharge on 10/20 to reach established min assist level goals.      Patient continues to demonstrate the following deficits: muscle weakness and muscle paralysis, impaired timing and sequencing, abnormal tone, unbalanced muscle activation, and decreased coordination, decreased attention to left, decreased awareness and decreased safety awareness, and decreased sitting balance, decreased standing balance, decreased postural control, hemiplegia, and decreased balance strategies and therefore will continue to benefit from skilled OT intervention to enhance overall performance with BADL and Reduce care partner burden.  Patient progressing toward long term goals..  Continue plan of care.  OT Short Term Goals Week 4:  OT Short Term Goal 1 (Week 4): Continue working on  established LTGs set at min assist overall.  Skilled Therapeutic Interventions/Progress Updates:    Session Note:  Pt worked on bathing, dressing, and toileting during session.  He was able to transfer to the edge of the bed from supine at mod assist level on the left side of the bed.  Therapist assisted with donning gripper socks before ambulating to the bathroom with max assist and no assistive device.  He was able to remove clothing sit to stand at min assist and then worked on bathing at overall min assist level.  He was able to incorporate one handed technique for washing the RUE.  Min assist for sit to stand as well.  Completed quick transfer to the toilet from the shower secondary urgency with mod assist.  He was able to complete toilet hygiene with min assist in sit to stand position.  Transferred squat pivot to the wheelchair at min assist once complete, in order to work on dressing tasks at the sink.  Supervision with min instructional cueing for UB dressing following hemi techniques with mod assist for LB dressing.  Pt continues to not pull them up all the way on the left side, requiring mod instructional cueing and min assist from therapist.  Mod assist for donning gripper socks using one handed technique with therapist assisting to hold the LLE over top of the right knee when dressing.  Finished session with min assist transfer to the bed with min assist to transition to supine.   NMES was applied to the left shoulder for treatment of subluxation.  He was able to tolerate at level 12 intensity for 60 mins without any adverse reactions.  See  below for parameters.    Saebo Stim One 330 pulse width 35 Hz pulse rate On 8 sec/ off 8 sec Ramp up/ down 2 sec Symmetrical Biphasic wave form  Max intensity 150m at 500 Ohm load  Therapy Documentation Precautions:  Precautions Precautions: Fall Precaution Comments: L hemi, L homonymous hemianopsia, R gaze preference Restrictions Weight Bearing  Restrictions: No   Pain: Pain Assessment Pain Scale: Faces Pain Score: 0-No pain ADL: See Care Tool Section for some details of mobility and selfcare   Therapy/Group: Individual Therapy  Avonne Berkery OTR/L 03/16/2021, 12:32 PM

## 2021-03-16 NOTE — Progress Notes (Signed)
Speech Language Pathology Weekly Progress and Session Note  Patient Details  Name: David Craig MRN: [id number] Date of Birth: 1985/07/13  Beginning of progress report period: March 09, 2021 End of progress report period: March 16, 2021  Today's Date: 03/16/2021 SLP Individual Time: 0810-0900 SLP Individual Time Calculation (min): 50 min  Short Term Goals: Week 3: SLP Short Term Goal 1 (Week 3): Patient will consume current diet with minimal overt s/s of aspiration with Min verbal cues for use of swallowing compensatory strategies. SLP Short Term Goal 1 - Progress (Week 3): Met SLP Short Term Goal 2 (Week 3): Patient will utilize speech intelligibility strategies at the sentence level to improve articulatory precision with Supervision level verbal cues. SLP Short Term Goal 2 - Progress (Week 3): Met SLP Short Term Goal 3 (Week 3): Patient will demosntrate sustained attention to functional tasks for 30 minutes with Mod verbal cues for redirection. SLP Short Term Goal 3 - Progress (Week 3): Met SLP Short Term Goal 4 (Week 3): Patient will demonstrate functional problem solving for basic and familiar tasks with Min verbal cues. SLP Short Term Goal 4 - Progress (Week 3): Met SLP Short Term Goal 5 (Week 3): Patient will utilize external memory aids to recall new, daily information with Supervision verbal cues. SLP Short Term Goal 5 - Progress (Week 3): Met SLP Short Term Goal 6 (Week 3): Patient will self-montior and correct errors during functional tasks with Min A verbal cues. SLP Short Term Goal 6 - Progress (Week 3): Met    New Short Term Goals: Week 4: SLP Short Term Goal 1 (Week 4): STGs=LTGs due to ELOS  Weekly Progress Updates: Patient has made excellent gains and has met 4 of 4 STGs this reporting period. Currently, patient is consuming regular textures with thin liquids without overt s/s of aspiration and requires overall supervision level verbal cues for use of swallowing  compensatory strategies. Patient demonstrates improved cognitive functioning and requires supervision-Min A multimodal cues for functional problem solving, selective attention and left visual scanning during functional tasks. Patient is ~90-100% intelligible at the conversation level and requires intermittent supervision verbal cues for use of speech intelligibility strategies. Patient and family education ongoing. Patient would benefit from continued skilled SLP intervention to maximize his speech, cognitive and swallowing function prior to discharge.     Intensity: Minumum of 1-2 x/day, 30 to 90 minutes Frequency: 3 to 5 out of 7 days Duration/Length of Stay: 03/22/21 Treatment/Interventions: Cognitive remediation/compensation;Dysphagia/aspiration precaution training;Internal/external aids;Cueing hierarchy;Environmental controls;Therapeutic Activities;Functional tasks;Patient/family education;Speech/Language facilitation   Daily Session  Skilled Therapeutic Interventions:  Skilled treatment session focused on dysphagia and cognitive goals. SLP facilitated session by providing Max A multinodal cues for use of swallowing compensatory strategies with breakfast meal of regular textures with thin liquids. Patient with overt cough X 1, suspect due to large sequential sips. Recommend patient continue current diet with full supervision. SLP also facilitated session by providing Min A verbal cues for visual scanning to left field of environment while reading aloud. Patient demonstrated improved prosody and intonation while reading aloud, however, patient with decreased speech intelligibility. Suspect due to fatigue. Patient left upright in bed with alarm on and all needs within reach. Continue with current plan of care.      Pain No/Denies Pain   Therapy/Group: Individual Therapy  David Craig 03/16/2021, 6:23 AM

## 2021-03-16 NOTE — Progress Notes (Signed)
Physical Therapy Session Note  Patient Details  Name: Jimmy Plessinger MRN: [id number] Date of Birth: [birth year]  Today's Date: 03/16/2021 PT Individual Time: 1410-1505 PT Individual Time Calculation (min): 55 min   Short Term Goals:  Week 3:  PT Short Term Goal 1 (Week 3): STGs = LTGs   Skilled Therapeutic Interventions/Progress Updates:   Pt received supine in bed and agreeable to PT. Supine>sit transfer with CGA assist and cues for use of bed rail. Stand pivot transfer to [facility] with CGA. PT assisted to don shoes sitting in WC and apply Foot up brace.   Pt transfer to rehab gym in [facility]. Pt performed dynamic balance training while engaged in [employer] cancellation x 2, seqeucning x 2 and visual scanning x 1. CGA and cues for attention to the LLE throughout as well as visual scanning to the L.    Nustep reciprocal movement training with BLE only and Thigh support on the L thigh.   Gait training with RW to bed x 32ft with min assist and moderate cues for posture, hip flexion in swing and hip extension  in stance on the L       Therapy Documentation Precautions:  Precautions Precautions: Fall Precaution Comments: L hemi, L homonymous hemianopsia, R gaze preference Restrictions Weight Bearing Restrictions: No  Pain:  denies    Therapy/Group: Individual Therapy  Golden Pop 03/16/2021, 5:35 PM

## 2021-03-16 NOTE — Progress Notes (Signed)
PROGRESS NOTE   Subjective/Complaints: Up in bed eating with SLP. Literally shoveling food into his mouth despite cues to slow down. Still some struggles falling to sleep.  ROS: Patient denies fever, rash, sore throat, blurred vision, nausea, vomiting, diarrhea, cough, shortness of breath or chest pain, joint or back pain, headache, or mood change.   Objective:   No results found. No results for input(s): WBC, HGB, HCT, PLT in the last 72 hours.  No results for input(s): NA, K, CL, CO2, GLUCOSE, BUN, CREATININE, CALCIUM in the last 72 hours.   Intake/Output Summary (Last 24 hours) at 03/16/2021 0929 Last data filed at 03/16/2021 0900 Gross per 24 hour  Intake 240 ml  Output 420 ml  Net -180 ml         Physical Exam: Vital Signs Blood pressure 100/65, pulse 67, temperature 98 F (36.7 C), temperature source Oral, resp. rate 18, height 5\' 8"  (1.727 m), weight 98.7 kg, SpO2 96 %. Constitutional: No distress . Vital signs reviewed. HEENT: NCAT, EOMI, oral membranes moist Neck: supple Cardiovascular: RRR without murmur. No JVD    Respiratory/Chest: CTA Bilaterally without wheezes or rales. Normal effort    GI/Abdomen: BS +, non-tender, non-distended Ext: no clubbing, cyanosis, or edema Psych: pleasant and engaging  Musc: No edema in extremities.  No tenderness in extremities. Neuro: very alert.  Dysarthria Left facial weakness still present Motor: LUE: Shoulder abduction and pecs 1+/5, distally 0/5  LLE: Hip flexion, knee extension 3/5, distally 0/5 , still with trace flexor tone LUE  Assessment/Plan: 1. Functional deficits which require 3+ hours per day of interdisciplinary therapy in a comprehensive inpatient rehab setting. Physiatrist is providing close team supervision and 24 hour management of active medical problems listed below. Physiatrist and rehab team continue to assess barriers to discharge/monitor  patient progress toward functional and medical goals  Care Tool:  Bathing    Body parts bathed by patient: Left arm, Chest, Abdomen, Front perineal area, Right upper leg, Left upper leg, Face, Right lower leg, Buttocks   Body parts bathed by helper: Left lower leg, Right arm     Bathing assist Assist Level: Minimal Assistance - Patient > 75%     Upper Body Dressing/Undressing Upper body dressing   What is the patient wearing?: Pull over shirt    Upper body assist Assist Level: Minimal Assistance - Patient > 75%    Lower Body Dressing/Undressing Lower body dressing      What is the patient wearing?: Underwear/pull up, Pants     Lower body assist Assist for lower body dressing: Moderate Assistance - Patient 50 - 74%     Toileting Toileting    Toileting assist Assist for toileting: Moderate Assistance - Patient 50 - 74% Assistive Device Comment: urinal   Transfers Chair/bed transfer  Transfers assist     Chair/bed transfer assist level: Minimal Assistance - Patient > 75%     Locomotion Ambulation   Ambulation assist      Assist level: Minimal Assistance - Patient > 75% Assistive device: Walker-rolling Max distance: 87'   Walk 10 feet activity   Assist     Assist level: Minimal Assistance - Patient >  75% Assistive device: Walker-rolling   Walk 50 feet activity   Assist Walk 50 feet with 2 turns activity did not occur: Safety/medical concerns  Assist level: Minimal Assistance - Patient > 75% Assistive device: Walker-rolling    Walk 150 feet activity   Assist Walk 150 feet activity did not occur: Safety/medical concerns         Walk 10 feet on uneven surface  activity   Assist Walk 10 feet on uneven surfaces activity did not occur: Safety/medical concerns         Wheelchair     Assist Is the patient using a wheelchair?: Yes Type of Wheelchair: Manual    Wheelchair assist level: Contact Guard/Touching assist Max  wheelchair distance: 100    Wheelchair 50 feet with 2 turns activity    Assist        Assist Level: Contact Guard/Touching assist   Wheelchair 150 feet activity     Assist      Assist Level: Dependent - Patient 0%   Blood pressure 100/65, pulse 67, temperature 98 F (36.7 C), temperature source Oral, resp. rate 18, height 5\' 8"  (1.727 m), weight 98.7 kg, SpO2 96 %.  Medical Problem List and Plan: 1.  Left-sided hemiparesis secondary to right PCA infarction secondary to terminal right ICA occlusion status post failed mechanical thrombectomy etiology likely secondary to cocaine use/vasospasm  -Continue CIR therapies including PT, OT, and SLP  WHO/PRAFO, continue ROM, splinting, slow motor return LUE 2.  Antithrombotics: -DVT/anticoagulation: Lower extremity Dopplers negative Pharmaceutical: Lovenox             -antiplatelet therapy: Aspirin 81 mg daily and Plavix 75 mg daily x90 days then aspirin alone 3. Pain Management: Tylenol as needed  Discussed with pt again. Pt could bring in foam overlay potentially  - kpad for back  Appears controlled on 10/14 4. Mood/sleep-wake: Provide emotional support  -continue scheduled seroquel at HS for sleep and irritability--adjusted to 100mg  and scheduled at 2000 to help him fall asleep earlier   -melatonin also             -antipsychotic agents: seroquel   -added nicotine patch for cigarette craving  -amantadine for day time arousal      5. Neuropsych: This patient is capable of making decisions on his own behalf. 6. Skin/Wound Care: Routine skin checks. Encourage appropriate nutrition 7. Fluids/Electrolytes/Nutrition: eating well 8.  Polysubstance abuse.  Provide counseling 9.  Hyperlipidemia: [place] 10.  Obesity.  BMI 34.97.  Dietary follow-up 11. Post-stroke dysphagia:  advanced to regular diet, tolerating 12. Hypoalbuminemia  Supplemented  -pt with great appetite 13.  Hyperglycemia  Likely stress-induced, continue to  monitor  LOS: 22 days A FACE TO FACE EVALUATION WAS PERFORMED  11/14 03/16/2021, 9:29 AM

## 2021-03-18 DIAGNOSIS — G479 Sleep disorder, unspecified: Secondary | ICD-10-CM

## 2021-03-18 NOTE — Progress Notes (Signed)
Speech Language Pathology Daily Session Note  Patient Details  Name: David Craig MRN: [id number] Date of Birth: 05-05-1986  Today's Date: 03/18/2021 SLP Individual Time: 0725-0800 SLP Individual Time Calculation (min): 35 min  Short Term Goals: Week 4: SLP Short Term Goal 1 (Week 4): STGs=LTGs due to ELOS  Skilled Therapeutic Interventions: Skilled SLP intervention focused on swallow safety and cognition. Pt seen with regular breakfast tray and thin liquids. Pt explained he has always been a "fast eater." Educated pt on rationale for using slow rate following CVA to minimize aspiration risk. Pt complete moderately complex visual scanning task with cards with supervision A and slightly increased time to locate cards and scan to far left visual field. Cont with therapy per plan of care.      Pain Pain Assessment Pain Scale: Faces Faces Pain Scale: No hurt  Therapy/Group: Individual Therapy  Amil Amen A Mertha Clyatt 03/18/2021, 8:00 AM

## 2021-03-18 NOTE — Progress Notes (Signed)
PROGRESS NOTE   Subjective/Complaints: Patient seen laying in bed this morning.  He states he slept well overnight.  He denies complaints.  ROS: Denies CP, SOB, N/V/D  Objective:   No results found. No results for input(s): WBC, HGB, HCT, PLT in the last 72 hours.  No results for input(s): NA, K, CL, CO2, GLUCOSE, BUN, CREATININE, CALCIUM in the last 72 hours.   Intake/Output Summary (Last 24 hours) at 03/18/2021 0756 Last data filed at 03/18/2021 9528 Gross per 24 hour  Intake 832 ml  Output 600 ml  Net 232 ml          Physical Exam: Vital Signs Blood pressure 107/83, pulse 69, temperature 97.7 F (36.5 C), resp. rate 19, height 5\' 8"  (1.727 m), weight 98.7 kg, SpO2 96 %. Constitutional: No distress . Vital signs reviewed. HENT: Normocephalic.  Atraumatic. Eyes: EOMI. No discharge. Cardiovascular: No JVD.  RRR. Respiratory: Normal effort.  No stridor.  Bilateral clear to auscultation. GI: Non-distended.  BS +. Skin: Warm and dry.  Intact. Psych: Normal mood.  Normal behavior. Musc: No edema in extremities.  No tenderness in extremities. Neuro: Alert Dysarthria, stable Left facial weakness still present Motor: LUE: Shoulder abduction and pecs 1+/5, distally 0/5, stable LLE: Hip flexion, knee extension 3/5, distally 0/5 , still with trace flexor tone LUE  Assessment/Plan: 1. Functional deficits which require 3+ hours per day of interdisciplinary therapy in a comprehensive inpatient rehab setting. Physiatrist is providing close team supervision and 24 hour management of active medical problems listed below. Physiatrist and rehab team continue to assess barriers to discharge/monitor patient progress toward functional and medical goals  Care Tool:  Bathing    Body parts bathed by patient: Left arm, Chest, Abdomen, Front perineal area, Right upper leg, Left upper leg, Face, Right lower leg, Buttocks   Body  parts bathed by helper: Right arm, Left lower leg     Bathing assist Assist Level: Minimal Assistance - Patient > 75%     Upper Body Dressing/Undressing Upper body dressing   What is the patient wearing?: Pull over shirt    Upper body assist Assist Level: Contact Guard/Touching assist    Lower Body Dressing/Undressing Lower body dressing      What is the patient wearing?: Underwear/pull up, Pants     Lower body assist Assist for lower body dressing: Moderate Assistance - Patient 50 - 74%     Toileting Toileting    Toileting assist Assist for toileting: Minimal Assistance - Patient > 75% Assistive Device Comment: urinal   Transfers Chair/bed transfer  Transfers assist     Chair/bed transfer assist level: Minimal Assistance - Patient > 75%     Locomotion Ambulation   Ambulation assist      Assist level: Minimal Assistance - Patient > 75% Assistive device: Walker-rolling Max distance: 75'   Walk 10 feet activity   Assist     Assist level: Minimal Assistance - Patient > 75% Assistive device: Walker-rolling   Walk 50 feet activity   Assist Walk 50 feet with 2 turns activity did not occur: Safety/medical concerns  Assist level: Minimal Assistance - Patient > 75% Assistive device: Walker-rolling  Walk 150 feet activity   Assist Walk 150 feet activity did not occur: Safety/medical concerns         Walk 10 feet on uneven surface  activity   Assist Walk 10 feet on uneven surfaces activity did not occur: Safety/medical concerns         Wheelchair     Assist Is the patient using a wheelchair?: Yes Type of Wheelchair: Manual    Wheelchair assist level: Contact Guard/Touching assist Max wheelchair distance: 100    Wheelchair 50 feet with 2 turns activity    Assist        Assist Level: Contact Guard/Touching assist   Wheelchair 150 feet activity     Assist      Assist Level: Dependent - Patient 0%   Blood  pressure 107/83, pulse 69, temperature 97.7 F (36.5 C), resp. rate 19, height 5\' 8"  (1.727 m), weight 98.7 kg, SpO2 96 %.  Medical Problem List and Plan: 1.  Left-sided hemiparesis secondary to right PCA infarction secondary to terminal right ICA occlusion status post failed mechanical thrombectomy etiology likely secondary to cocaine use/vasospasm  Continue CIR WHO/PRAFO, continue ROM, splinting, slow motor return LUE 2.  Antithrombotics: -DVT/anticoagulation: Lower extremity Dopplers negative Pharmaceutical: Lovenox             -antiplatelet therapy: Aspirin 81 mg daily and Plavix 75 mg daily x90 days then aspirin alone 3. Pain Management: Tylenol as needed  - kpad for back  Controlled on 10/16 4. Mood/sleep-wake: Provide emotional support  -continue scheduled seroquel at HS for sleep and irritability--adjusted to 100mg  and scheduled at 2000 to help him fall asleep earlier   -melatonin also   Sleep improving             -antipsychotic agents: seroquel   -added nicotine patch for cigarette craving  -amantadine for day time arousal   5. Neuropsych: This patient is capable of making decisions on his own behalf. 6. Skin/Wound Care: Routine skin checks. Encourage appropriate nutrition 7. Fluids/Electrolytes/Nutrition: eating well 8.  Polysubstance abuse.  Provide counseling 9.  Hyperlipidemia: Lipitor 10.  Obesity.  BMI 34.97.  Dietary follow-up 11. Post-stroke dysphagia:  advanced to regular diet, tolerating 12. Hypoalbuminemia  Supplemented  -pt with great appetite 13.  Hyperglycemia  Likely stress-induced, continue to monitor  LOS: 24 days A FACE TO FACE EVALUATION WAS PERFORMED  Mindi Akerson 11/16 03/18/2021, 7:56 AM

## 2021-03-19 NOTE — Progress Notes (Addendum)
Physical Therapy Session Note  Patient Details  Name: David Craig MRN: [id number] Date of Birth: [birth year]  Today's Date: 03/19/2021 PT Individual Time: 1415-1445 and 1100-1210 PT Individual Time Calculation (min): 30 min and 70 min  Short Term Goals: Week 1:  PT Short Term Goal 1 (Week 1): Pt will perform bed mobility consistently with minA. PT Short Term Goal 1 - Progress (Week 1): Progressing toward goal PT Short Term Goal 2 (Week 1): Pt will perform sit to stand transfer consistently with minA. PT Short Term Goal 2 - Progress (Week 1): Progressing toward goal PT Short Term Goal 3 (Week 1): Pt will perform bed to chair transfer consistently with minA. PT Short Term Goal 3 - Progress (Week 1): Progressing toward goal PT Short Term Goal 4 (Week 1): Pt will ambulate x50' with modA +1 and LRAD. PT Short Term Goal 4 - Progress (Week 1): Met Week 2:  PT Short Term Goal 1 (Week 2): Pt will perform sit to stand consistently with minA. PT Short Term Goal 1 - Progress (Week 2): Met PT Short Term Goal 2 (Week 2): Pt will perform bed to chair consistently with minA. PT Short Term Goal 2 - Progress (Week 2): Met PT Short Term Goal 3 (Week 2): Pt will ambulate x50' with minA and LRAD. PT Short Term Goal 3 - Progress (Week 2): Met PT Short Term Goal 4 (Week 2): Pt will perform bed mobility consistently with minA. PT Short Term Goal 4 - Progress (Week 2): Progressing toward goal Week 3:  PT Short Term Goal 1 (Week 3): STGs = LTGs Week 4:     Skilled Therapeutic Interventions/Progress Updates:    AM SESSION;  Pt seen today for scheduled Family Training Session.  Wife and brother present for training.  Pt initially oob in recliner, wearing toe up AFO.  stand pivot transfer to wc w/cga, max cues for safety, sequencing, d/t impulsivity and poor safety awarenss. Discussed equipment needs.  Discussed wc and wife stated that majority of doorways in home measure 26in.  Overall width of 18x18 currently  in use = 25in.  Will trial 16 in wc this pm due to accessibility issues.  Pt transported to gym.  Family observed gait w/RW which pt performs w/min assist x 80f progressing to mod assist w/fatigue d/t LLE instability, progressivly decreasing LLE clearance.  Discussed safety awareness, importance of attention to task, impulsivity, breaking down instructions into simple steps, hand positioning in hand orthosis, guarding.  Discussed likely need for PLS vs toe up for improved ankle stability and clearance.  Pt also c/o footpain w/stance which therapist performed stretching of gastroc, soleus, toe flexors.    Car transfer: Therapist assisted pt w/car transfer using RW, verbalized guarding, safety awarness, sequencing.  Pt performed w/min assist, max cues for safety. Wife and brother each practiced transfer w/pt and performed w/good technique w/minimal feedback needed.  At end of session, brother performed stand pivot transfer wc to bed safely w/pt.  Shoes/AFOdoffed by wife without cues.  Pt left supine w/rails up x 4, alarm set, bed in lowest position, and needs in reach. Therapist discussed recs for OPT w/transportation, wc - possibly 16x16/trial today, RW  PM session: Therapist obtained 16X16 wc and cushion for assessment of viability w/pt due to inaccessibility issues w/18X18 as reported by wife. Pt initially supine and attempts to transfer to sitting w/L UE/LLE resting splints on and bedrails up.  Cued to return supine for removal of splints/lowering of bedrail.  Supine to  sit w/rail and supervision.  stand pivot transfer bed to wc w/min assist.  Pt fits into 1616 w/no additional space, states wc is comfortable but encouraged pt to sit in wc for a couple of hrs to ensure tolerance.  Gait 169f w/RW w/L hand orthosis, AFO, wc follow, decreased clearance LLE thru swing, use of adducters to assist w/swing, cues for upright posture and hip extension loading thru terminal stance, increased L lateral trunk  excursions w/stance L (varies), L knee wobbles w/lodading/midstance.  At end of session,Pt left oob in wc w/alarm belt set and needs in reach.  Wife at pt side.       Therapy Documentation Precautions:  Precautions Precautions: Fall Precaution Comments: L hemi, L homonymous hemianopsia, R gaze preference Restrictions Weight Bearing Restrictions: No     Therapy/Group: Individual Therapy' BCallie Fielding PValmeyer10/17/2022, 3:16 PM

## 2021-03-19 NOTE — Progress Notes (Signed)
Speech Language Pathology Daily Session Note  Patient Details  Name: Amanuel Sinkfield MRN: [id number] Date of Birth: [birth year]  Today's Date: 03/19/2021 SLP Individual Time: 1025-1105 SLP Individual Time Calculation (min): 40 min  Short Term Goals: Week 4: SLP Short Term Goal 1 (Week 4): STGs=LTGs due to ELOS  Skilled Therapeutic Interventions: Pt received in wheelchair and agreeable to skilled ST intervention with focus on family education. SLP provided education on progress with speech therapy and continued areas for improvement from a cognitive-communication, speech, and swallowing standpoint. Discussed strategies for left visual scanning, attention, and safety within home environment. SLP provided insight on beneficial functional activities to further promote left visual scanning including sorting/organizing household items, writing daily to-do lists, using calendar to manage upcoming appointments, etc. Also discussed various activities to work on with children including verbally reading stories, coloring, puzzles, etc. All topics were discussed with emphasis on safety considerations. SLP also reinforced education on safe swallow precautions and continued impulsivity with PO intake. Spouse verbalized understanding by summarizing discussed topics and stating she would be present to provide close supervision as she is aware of his impulsivity and reduced safety awareness. Spouse indicated transport to OP therapy at discharge may be difficult due to getting pt into/out of vehicle and discussed how HH is also an option. Spouse stated they may have access to another vehicle and in the process of figuring out the details. Patient was left in wheelchair with alarm activated and immediate needs within reach at end of session. Continue per current plan of care.      Pain Pain Assessment Pain Scale: 0-10 Pain Score: 0-No pain  Therapy/Group: Individual Therapy  Tamala Ser 03/19/2021, 10:57 AM

## 2021-03-19 NOTE — Progress Notes (Signed)
PROGRESS NOTE   Subjective/Complaints:  Pt reports doing well- easier to go/stay asleep.  Wife asking if needs to go home on Lovenox- and if does need to- wants him "to go home on pill if possible- but if not, needs education on Lovenox- not walking very well.    ROS:  Pt denies SOB, abd pain, CP, N/V/C/D, and vision changes   Objective:   No results found. No results for input(s): WBC, HGB, HCT, PLT in the last 72 hours.  No results for input(s): NA, K, CL, CO2, GLUCOSE, BUN, CREATININE, CALCIUM in the last 72 hours.   Intake/Output Summary (Last 24 hours) at 03/19/2021 0959 Last data filed at 03/18/2021 2100 Gross per 24 hour  Intake 356 ml  Output 800 ml  Net -444 ml         Physical Exam: Vital Signs Blood pressure 105/67, pulse 73, temperature 98 F (36.7 C), resp. rate 18, height 5\' 8"  (1.727 m), weight 98.7 kg, SpO2 95 %.    General: awake, alert, appropriate, but asleep initially- woke to verbal stimuli; wife at bedside;  NAD HENT: conjugate gaze; oropharynx moist CV: regular rate; no JVD Pulmonary: CTA B/L; no W/R/R- good air movement GI: soft, NT, ND, (+)BS Psychiatric: appropriate; sleepy Neurological: alert  Skin: Warm and dry.  Intact. Musc: No edema in extremities.  No tenderness in extremities. Dysarthria, stable Left facial weakness still present Motor: LUE: Shoulder abduction and pecs 1+/5, distally 0/5, stable LLE: Hip flexion, knee extension 3/5, distally 0/5 , still with trace flexor tone LUE  Assessment/Plan: 1. Functional deficits which require 3+ hours per day of interdisciplinary therapy in a comprehensive inpatient rehab setting. Physiatrist is providing close team supervision and 24 hour management of active medical problems listed below. Physiatrist and rehab team continue to assess barriers to discharge/monitor patient progress toward functional and medical goals  Care  Tool:  Bathing    Body parts bathed by patient: Left arm, Chest, Abdomen, Front perineal area, Right upper leg, Left upper leg, Face, Right lower leg, Buttocks   Body parts bathed by helper: Right arm, Left lower leg     Bathing assist Assist Level: Minimal Assistance - Patient > 75%     Upper Body Dressing/Undressing Upper body dressing   What is the patient wearing?: Pull over shirt    Upper body assist Assist Level: Contact Guard/Touching assist    Lower Body Dressing/Undressing Lower body dressing      What is the patient wearing?: Underwear/pull up, Pants     Lower body assist Assist for lower body dressing: Moderate Assistance - Patient 50 - 74%     Toileting Toileting    Toileting assist Assist for toileting: Minimal Assistance - Patient > 75% Assistive Device Comment: urinal   Transfers Chair/bed transfer  Transfers assist     Chair/bed transfer assist level: Minimal Assistance - Patient > 75%     Locomotion Ambulation   Ambulation assist      Assist level: Minimal Assistance - Patient > 75% Assistive device: Walker-rolling Max distance: 69'   Walk 10 feet activity   Assist     Assist level: Minimal Assistance - Patient >  75% Assistive device: Walker-rolling   Walk 50 feet activity   Assist Walk 50 feet with 2 turns activity did not occur: Safety/medical concerns  Assist level: Minimal Assistance - Patient > 75% Assistive device: Walker-rolling    Walk 150 feet activity   Assist Walk 150 feet activity did not occur: Safety/medical concerns         Walk 10 feet on uneven surface  activity   Assist Walk 10 feet on uneven surfaces activity did not occur: Safety/medical concerns         Wheelchair     Assist Is the patient using a wheelchair?: Yes Type of Wheelchair: Manual    Wheelchair assist level: Contact Guard/Touching assist Max wheelchair distance: 100    Wheelchair 50 feet with 2 turns  activity    Assist        Assist Level: Contact Guard/Touching assist   Wheelchair 150 feet activity     Assist      Assist Level: Dependent - Patient 0%   Blood pressure 105/67, pulse 73, temperature 98 F (36.7 C), resp. rate 18, height 5\' 8"  (1.727 m), weight 98.7 kg, SpO2 95 %.  Medical Problem List and Plan: 1.  Left-sided hemiparesis secondary to right PCA infarction secondary to terminal right ICA occlusion status post failed mechanical thrombectomy etiology likely secondary to cocaine use/vasospasm  Continue CIR WHO/PRAFO, continue ROM, splinting, slow motor return LUE Continue CIR- PT, OT and SLP  2.  Antithrombotics: -DVT/anticoagulation: Lower extremity Dopplers negative Pharmaceutical: Lovenox  10/17- wife asking if will go home on Lovenox- if not fine; but would rather do pill, or educated on lovenox, if possible.              -antiplatelet therapy: Aspirin 81 mg daily and Plavix 75 mg daily x90 days then aspirin alone 3. Pain Management: Tylenol as needed  - kpad for back  Controlled on 10/16 4. Mood/sleep-wake: Provide emotional support  -continue scheduled seroquel at HS for sleep and irritability--adjusted to 100mg  and scheduled at 2000 to help him fall asleep earlier   -melatonin also   Sleep improving             -antipsychotic agents: seroquel   -added nicotine patch for cigarette craving  -amantadine for day time arousal  10/17- sleep going much better- easier to go/stay asleep- con't regimen  5. Neuropsych: This patient is capable of making decisions on his own behalf. 6. Skin/Wound Care: Routine skin checks. Encourage appropriate nutrition 7. Fluids/Electrolytes/Nutrition: eating well 8.  Polysubstance abuse.  Provide counseling 9.  Hyperlipidemia: Lipitor 10.  Obesity.  BMI 34.97.  Dietary follow-up 11. Post-stroke dysphagia:  advanced to regular diet, tolerating 12. Hypoalbuminemia  Supplemented  -pt with great appetite 13.   Hyperglycemia  Likely stress-induced, continue to monitor  10/17- BG's 108 on last labs- con't t monitor  LOS: 25 days A FACE TO FACE EVALUATION WAS PERFORMED  Keliyah Spillman 03/19/2021, 9:59 AM

## 2021-03-19 NOTE — Progress Notes (Signed)
Occupational Therapy Session Note  Patient Details  Name: David Craig MRN: [id number] Date of Birth: 1985-09-26  Today's Date: 03/19/2021 OT Individual Time: 6789-3810 OT Individual Time Calculation (min): 83 min    Short Term Goals: Week 4:  OT Short Term Goal 1 (Week 4): Continue working on established LTGs set at min assist overall.  Skilled Therapeutic Interventions/Progress Updates:    Pt's spouse and his brother in for family education this session.  Completed supine to sit on the left side of the bed with mod assist.  Discussed ease of completing supine to to sit on the right side of the bed instead of the left secondary to the impaired UE use.  Next, discussed completion of selfcare tasks following hemi techniques.  Had him complete donning socks using one handed method with mod assist for the left and then supervision for the right.  He was able to donn the right shoe with setup as well but needed max assist to tie.  Discussed use of elastic laces at home so the he doesn't have to tie them.  He needed max assist for donning the left shoe and foot up brace.  Educated pts' spouse and his brother on squat pivot and stand pivot transfers without an assistive device to both directions.  They were able to return demonstrate completion with pt performing min assist squat pivots.  Next, had him complete stand pivot with use of the RW with hand splint on the left for support.  He was able to complete with min assist using the RW for support.  Attempted toilet transfer with simulated setup of his bathroom at home.  He was able to complete  side stepping with the RW and min assist for simulated transfer.  Recommended toilet riser secondary to family stating that they don't feel that the 3:1 will fit over the toilet.  Discussed need for a shower bench as well but they feel initially that they can take the pt down to his mom's to shower at the walk-in shower.  Discussed and educated them on technique for  this to step in backwards and have the seat facing out the door.  Then he can step out forward.  Did not get a chance to practice the walk-in shower or simulated toilet transfer with the RW this session.  Provided handout on AAROM exercises for the RUE as well as info on Saebo Stim One.  Will review exercises next visit.  Left pt with SLP in the room for next session.  Plan for family education again tomorrow.     Therapy Documentation Precautions:  Precautions Precautions: Fall Precaution Comments: L hemi, L homonymous hemianopsia, R gaze preference Restrictions Weight Bearing Restrictions: No   Pain: Pain Assessment Pain Scale: 0-10 Pain Score: 0-No pain ADL: See Care Tool Section for some details of mobility and selfcare  Therapy/Group: Individual Therapy  Laronda Lisby OTR/L 03/19/2021, 12:28 PM

## 2021-03-19 NOTE — Progress Notes (Signed)
Patient ID: David Craig, male   DOB: May 18, 1986, 35 y.o.   MRN: [id number]  Per therapy team, d/c recommendations: w/c, RW, and toilet seat riser; Outpatient PT/OT/SLP.  SW discussed above with pt wife. Preferred outpatient location is: [facility] and would like [facility] transportation. SW ordered RW and toilet seat riser as waiting on w/c measurements that allow pt to navigate in the home.   SW ordered RW as waiting on w/c measurements.  Cecile Sheerer, MSW, LCSWA Office: [phone] Cell: [phone] Fax: [phone]

## 2021-03-20 NOTE — Progress Notes (Signed)
Occupational Therapy Session Note  Patient Details  Name: David Craig MRN: [id number] Date of Birth: August 11, 1985  Today's Date: 03/20/2021 OT Individual Time: 3785-8850 OT Individual Time Calculation (min): 59 min    Short Term Goals: Week 4:  OT Short Term Goal 1 (Week 4): Continue working on established LTGs set at min assist overall.  Skilled Therapeutic Interventions/Progress Updates:    Session 1: (2774-1287)  Pt with no report of pain during session.  Min assist for transfer from supine to sit EOB with HOB elevated.  He then needed mod assist for functional mobility to the bathroom without an assistive device.  Decreased LLE control with stepping and with attempt to stabilize the LLE, requiring mod facilitation at times.  Once in the bathroom he was able to transfer to the toilet and complete clothing management and toilet hygiene with supervision.  Next, he transferred over to the tub bench with mod assist for shower.  Supervision for doffing a pullover shirt with mod assist to doff LB clothing in order to remove it from the LLE.  He was able to complete bathing with overall min assist and mod demonstrational cueing for sequencing secondary to distraction with conversation.  He was able to utilize one handed technique for washing some of the RUE but still needed therapist assist.  He was able to complete washing the LLE with min assist as well.  Once finished, he was able to ambulate out to the EOB with min assist using the RW for support.  He was able to donn a pullover shirt with supervision with mod assist for donning all LB clothing secondary to needing assist to cross and maintain the LLE over the right knee to get the items started.  Mod instructional cueing was needed for slowing down and positioning the LLE prior to sit to stand.  Cueing also needed for turning the pants around after placing the LLE in secondary to initially putting the RLE in the same leg hole.  Finished session with  NMES applied to the left shoulder for treatment of subluxation.  He was able to tolerate 60 mins at level 12 at the below parameters. Saebo Stim One 330 pulse width 35 Hz pulse rate On 8 sec/ off 8 sec Ramp up/ down 2 sec Symmetrical Biphasic wave form  Max intensity at 500 Ohm load  Session 2:  (1304-1400)  Pt's spouse, and his sister and nephew present.  Had pt transfer to the EOB with mod assist on the left side of the bed and spouse assisting.  She then helped to stabilize his LLE up on the right knee so that he could use the one handed technique to donn his sock.  He was able to donn the right sock and shoe with setup assist but needed total assist for tying it.  He required total assist for donning the left shoe and AFO as well.  Next, had his spouse as well as the other family members complete simulated toilet transfers with use of the RW and overall min assist.  They were able to demonstrate safe completion of side stepping into the simulated bathroom and out with min assist.  Also had his family complete squat pivot transfers from bed to wheelchair as well with min assist.  All people involved were able to return demonstrate efficiently.  Finished session with SLP in the room and pt sitting in the wheelchair.     Therapy Documentation Precautions:  Precautions Precautions: Fall Precaution Comments: L hemi,  L homonymous hemianopsia, R gaze preference Restrictions Weight Bearing Restrictions: No  Pain: Pain Assessment Pain Scale: Faces Pain Score: 0-No pain ADL: See Care Tool Section for some details of mobility and selfcare  Therapy/Group: Individual Therapy  Marnie Fazzino OTR/L 03/20/2021, 12:10 PM

## 2021-03-20 NOTE — Patient Care Conference (Signed)
Inpatient RehabilitationTeam Conference and Plan of Care Update Date: 03/20/2021   Time: 10:35 AM    Patient Name: David Craig      Medical Record Number: [id number]  Date of Birth: [birth year] Sex: Male         Room/Bed: 4W08C/4W08C-01 Payor Info: Payor: [employer] / Plan: [employer] COMM PPO / Product Type: *No Product type* /    Admit Date/Time:  02/22/2021  3:23 PM  Primary Diagnosis:  Cerebrovascular accident (CVA) due to occlusion of right posterior cerebral artery [facility])  Hospital Problems: Principal Problem:   Cerebrovascular accident (CVA) due to occlusion of right posterior cerebral artery (HCC) Active Problems:   Hypoalbuminemia due to protein-calorie malnutrition (HCC)   Dyslipidemia   Hemiparesis affecting left side as late effect of stroke (HCC)   Lethargy   Hyperglycemia   Sleep disturbance    Expected Discharge Date: Expected Discharge Date: 03/22/21  Team Members Present: Physician leading conference: Dr. Faith Rogue Social Worker Present: Cecile Sheerer, LCSWA Nurse Present: Kennyth Arnold, RN PT Present: Malachi Pro, PT OT Present: Kearney Hard, OT SLP Present: Feliberto Gottron, SLP PPS Coordinator present : Fae Pippin, SLP     Current Status/Progress Goal Weekly Team Focus  Bowel/Bladder   Pt continent of B/B. LBM 03/18/2021  Continued rgular BMs every 3 days or less  Assess B/B every shift and PRN   Swallow/Nutrition/ Hydration   Cont regular textures with thin liquids, sup A for use of swallow strategies  mod I  use of swallowing compensatory strategies and monitoring rate of consumption   ADL's   Min A UB ADLs, Min/mod A LB ADLs, Min A transfers  supervision to min, will need to be downgraded next PN to min assist level  Pt/family education, transfers, balance retraining, NMR, NMES, self-care retraining   Mobility   supervision bed mobility, CGA transfers, gait x160' with RW and L hand splint  CGA  Family ed, DC prep    Communication   mod I-to-sup A  mod I  use of speech intelligiblity strategies   Safety/Cognition/ Behavioral Observations  sup-to-min A  sup A  family ed completed 10/17. Cont functional problem solving, selective attention, left visual scanning, safety awareness, appropriateness   Pain   denies pain  continued pain level of 0/10  Assess pain every shift and PRN   Skin   skin intact  no breakdown  assess skin every shift and PRN     Discharge Planning:  Pt to discharge to home with intermittent support from his wife who works from home and cares for their 1 and 3 y.o. sons. PRN support from family that live close to their home. Pt set up for [facility] PT/OT/SLP. DME recs: W/c. Will order once aware of w/c measurements as family have concerns about how pt will get into the home.   Team Discussion: Sleeping better but not ideal. Eating well. Had conversation about stroke prevention. Continent B/B, LBM 10/16. Seroquel for sleep. Set up with [facility].  Patient on target to meet rehab goals: yes, family education went well. Min assist to contact guard. Additional family education today. Continuing to work on impulsiveness and safety awareness.  *See Care Plan and progress notes for long and short-term goals.   Revisions to Treatment Plan:  Finalizing discharge plans and medications.  Teaching Needs: Family education, medication management, pain management, safety awareness.  Current Barriers to Discharge: Decreased caregiver support, Home enviroment access/layout, Lack of/limited family support, Weight, Medication  compliance, and Behavior  Possible Resolutions to Barriers: Family education, outpatient therapy set up with [facility], 16" W/C ordered.     Medical Summary Current Status: some improvement in LUE but very slow. sleeping better. pain controlled. ongoing memory deficits  Barriers to Discharge: Medical stability   Possible Resolutions to  [employer] Focus: finalize meds and plan for discharge.   Continued Need for Acute Rehabilitation Level of Care: The patient requires daily medical management by a physician with specialized training in physical medicine and rehabilitation for the following reasons: Direction of a multidisciplinary physical rehabilitation program to maximize functional independence : Yes Medical management of patient stability for increased activity during participation in an intensive rehabilitation regime.: Yes Analysis of laboratory values and/or radiology reports with any subsequent need for medication adjustment and/or medical intervention. : Yes   I attest that I was present, lead the team conference, and concur with the assessment and plan of the team.   Tennis Must 03/20/2021, 2:02 PM

## 2021-03-20 NOTE — Progress Notes (Signed)
Patient ID: David Craig, male   DOB: [birth year], 35 y.o.   MRN: [id number]  SW received updates from [place] who reported pt wife cancelled RW and toilet seat riser; stating only wants w/c. SW waiting on w/c measurements.   SW faxed PT/OT/SLP referral to [facility] (p:[phone]/f:[phone]).  SW sent transportation referral to [employer].   SW met with pt and pt wife in room to discuss d/c recommendations. Confirms she declined RW and will purchase her own. Would like to have w/c. SW will order 16x16 w/c for pt.   SW ordered w/c with [employer] via parachute.   Loralee Pacas, MSW, [place] Office: [phone] Cell: [phone] Fax: [phone]

## 2021-03-20 NOTE — Progress Notes (Signed)
PROGRESS NOTE   Subjective/Complaints:  No new issues. Asked questions about "clots in head", blood thinner. We had same discussion Friday.    ROS: Patient denies fever, rash, sore throat, blurred vision, nausea, vomiting, diarrhea, cough, shortness of breath or chest pain, joint or back pain, headache, or mood change.    ROS:  Pt denies SOB, abd pain, CP, N/V/C/D, and vision changes   Objective:   No results found. No results for input(s): WBC, HGB, HCT, PLT in the last 72 hours.  No results for input(s): NA, K, CL, CO2, GLUCOSE, BUN, CREATININE, CALCIUM in the last 72 hours.   Intake/Output Summary (Last 24 hours) at 03/20/2021 0926 Last data filed at 03/20/2021 0730 Gross per 24 hour  Intake 480 ml  Output --  Net 480 ml         Physical Exam: Vital Signs Blood pressure (!) 93/59, pulse 68, temperature 98.1 F (36.7 C), resp. rate 18, height 5\' 8"  (1.727 m), weight 98.7 kg, SpO2 95 %.    Constitutional: No distress . Vital signs reviewed. HEENT: NCAT, EOMI, oral membranes moist Neck: supple Cardiovascular: RRR without murmur. No JVD    Respiratory/Chest: CTA Bilaterally without wheezes or rales. Normal effort    GI/Abdomen: BS +, non-tender, non-distended Ext: no clubbing, cyanosis, or edema Psych: cooperative, waking up from sleep Neurological: alert Skin: Warm and dry.  Intact. Musc: No edema in extremities.  No tenderness in extremities. Dysarthria, stable Left facial weakness persistent Motor: LUE: Shoulder abduction and pecs 1+/5, distally 0/5, stable LLE: Hip flexion, knee extension 3/5, distally 0/5 , still with trace flexor tone LUEE  Assessment/Plan: 1. Functional deficits which require 3+ hours per day of interdisciplinary therapy in a comprehensive inpatient rehab setting. Physiatrist is providing close team supervision and 24 hour management of active medical problems listed  below. Physiatrist and rehab team continue to assess barriers to discharge/monitor patient progress toward functional and medical goals  Care Tool:  Bathing    Body parts bathed by patient: Left arm, Chest, Abdomen, Front perineal area, Right upper leg, Left upper leg, Face, Right lower leg, Buttocks   Body parts bathed by helper: Right arm, Left lower leg     Bathing assist Assist Level: Minimal Assistance - Patient > 75%     Upper Body Dressing/Undressing Upper body dressing   What is the patient wearing?: Pull over shirt    Upper body assist Assist Level: Contact Guard/Touching assist    Lower Body Dressing/Undressing Lower body dressing      What is the patient wearing?: Underwear/pull up, Pants     Lower body assist Assist for lower body dressing: Moderate Assistance - Patient 50 - 74%     Toileting Toileting    Toileting assist Assist for toileting: Minimal Assistance - Patient > 75% Assistive Device Comment: urinal   Transfers Chair/bed transfer  Transfers assist     Chair/bed transfer assist level: Minimal Assistance - Patient > 75%     Locomotion Ambulation   Ambulation assist      Assist level: Minimal Assistance - Patient > 75% Assistive device: Walker-rolling Max distance: 19'   Walk 10 feet activity  Assist     Assist level: Minimal Assistance - Patient > 75% Assistive device: Walker-rolling   Walk 50 feet activity   Assist Walk 50 feet with 2 turns activity did not occur: Safety/medical concerns  Assist level: Minimal Assistance - Patient > 75% Assistive device: Walker-rolling    Walk 150 feet activity   Assist Walk 150 feet activity did not occur: Safety/medical concerns         Walk 10 feet on uneven surface  activity   Assist Walk 10 feet on uneven surfaces activity did not occur: Safety/medical concerns         Wheelchair     Assist Is the patient using a wheelchair?: Yes Type of Wheelchair:  Manual    Wheelchair assist level: Contact Guard/Touching assist Max wheelchair distance: 100    Wheelchair 50 feet with 2 turns activity    Assist        Assist Level: Contact Guard/Touching assist   Wheelchair 150 feet activity     Assist      Assist Level: Dependent - Patient 0%   Blood pressure (!) 93/59, pulse 68, temperature 98.1 F (36.7 C), resp. rate 18, height 5\' 8"  (1.727 m), weight 98.7 kg, SpO2 95 %.  Medical Problem List and Plan: 1.  Left-sided hemiparesis secondary to right PCA infarction secondary to terminal right ICA occlusion status post failed mechanical thrombectomy etiology likely secondary to cocaine use/vasospasm   ELOS 10/20 WHO/PRAFO, continue ROM, splinting, slow motor return LUE -Continue CIR therapies including PT, OT, and SLP. Interdisciplinary team conference today to discuss goals, barriers to discharge, and dc planning.    2.  Antithrombotics: -DVT/anticoagulation: Lower extremity Dopplers negative Pharmaceutical: Lovenox  -he will not go home on lovenox             -antiplatelet therapy: Aspirin 81 mg daily and Plavix 75 mg daily x90 days then aspirin alone 3. Pain Management: Tylenol as needed  - kpad for back  Controlled on 10/18 4. Mood/sleep-wake: Provide emotional support  -continue scheduled seroquel at HS for sleep and irritability--adjusted to 100mg  and scheduled at 2000 to help him fall asleep earlier   -melatonin also   Sleep improving             -antipsychotic agents: seroquel   -added nicotine patch for cigarette craving  -amantadine for day time arousal  10/18- doing fairly well. Anxious to get in own bed 5. Neuropsych: This patient is capable of making decisions on his own behalf. 6. Skin/Wound Care: Routine skin checks. Encourage appropriate nutrition 7. Fluids/Electrolytes/Nutrition: eating well 8.  Polysubstance abuse.  Provide counseling 9.  Hyperlipidemia: Lipitor 10.  Obesity.  BMI 34.97.  Dietary  follow-up 11. Post-stroke dysphagia:  advanced to regular diet, tolerating 12. Hypoalbuminemia  Eating well 13.  Hyperglycemia  Likely stress-induced, continue to monitor  10/17- BG's 108 on last labs- follow up as outpt  LOS: 26 days A FACE TO FACE EVALUATION WAS PERFORMED  11/18 03/20/2021, 9:26 AM

## 2021-03-20 NOTE — Progress Notes (Signed)
Physical Therapy Session Note  Patient Details  Name: David Craig MRN: [id number] Date of Birth: 07-05-1985  Today's Date: 03/20/2021 PT Individual Time: 1435-1530 PT Individual Time Calculation (min): 55 min   Short Term Goals: Week 3:  PT Short Term Goal 1 (Week 3): STGs = LTGs  Skilled Therapeutic Interventions/Progress Updates:     Pt received seated in WC and agrees to therapy. No complaint of pain. Wife, sister, and nephew present for family education. PT re-educates family on pt funcitonal status and importance of ensuring pt has distraction limited and environment set up for safety. WC transport to gym for time management. Pt performs car transfer with PT providing CGA, with cues for sequencing and hand placement. Pt then performs additional car transfer with wife providing assistance. After discussion on various strategies for safe car transfer, family decides to trial actual car transfer with wife's Manufacturing engineer. Pt transported via WC outside. Pt then performs transfer into front seat of pilot with wife providing minA and PT cueing on hand placement and body position, as well as cues for pt body mechanics and sequencing. Following, pt trials transfer into back seat going on driver's side to setup transfer to R side. Pt then scoots across back seat to transfer into WC going toward R. Wife provides CGA/minA to complete. WC transport back to room. Pt left seated in WC with alarm intact and all needs within reach.  Therapy Documentation Precautions:  Precautions Precautions: Fall Precaution Comments: L hemi, L homonymous hemianopsia, R gaze preference Restrictions Weight Bearing Restrictions: No    Therapy/Group: Individual Therapy  Beau Fanny, PT, DPT 03/20/2021, 3:42 PM

## 2021-03-20 NOTE — Progress Notes (Signed)
Speech Language Pathology Daily Session Note  Patient Details  Name: Jamin Panther MRN: [id number] Date of Birth: Jul 06, 1985  Today's Date: 03/20/2021 SLP Individual Time: 5462-7035 SLP Individual Time Calculation (min): 29 min  Short Term Goals: Week 4: SLP Short Term Goal 1 (Week 4): STGs=LTGs due to ELOS  Skilled Therapeutic Interventions: Skilled treatment session focused on completion of family education with the patient, his sister and nephew. SLP facilitated session by providing education regarding patient's current swallowing function, diet recommendations, compensatory strategies and medication administration.  SLP also provided education regarding patient's current cognitive functioning and strategies to utilize at home to maximize attention, problem solving, awareness and overall safety. A list of activities was also provided that patient can safely participate in at home to stay cognitively engaged. Everyone verbalized understanding and agreement and all questions were answered at this time. Patient left upright in wheelchair with family present. Continue with current plan of care.      Pain No/Denies Pain   Therapy/Group: Individual Therapy  Kayela Humphres 03/20/2021, 2:40 PM

## 2021-03-20 NOTE — Discharge Summary (Signed)
Physician Discharge Summary  Patient ID: David Craig MRN: [id number] DOB/AGE: 37-Jan-1987 37 y.o.  Admit date: 02/22/2021 Discharge date: 03/22/2021  Discharge Diagnoses:  Principal Problem:   Cerebrovascular accident (CVA) due to occlusion of right posterior cerebral artery (HCC) Active Problems:   Hypoalbuminemia due to protein-calorie malnutrition (HCC)   Dyslipidemia   Hemiparesis affecting left side as late effect of stroke (HCC)   Lethargy   Hyperglycemia   Sleep disturbance   Sprain of left ankle Hyperlipidemia Obesity Polysubstance abuse  Discharged Condition: Stable  Significant Diagnostic Studies: ECHO TEE  Result Date: 02/21/2021    TRANSESOPHOGEAL ECHO REPORT   Patient Name:   David Craig Date of Exam: 02/21/2021 Medical Rec #:  [id number]    Height:       68.0 in Accession #:    [id number]   Weight:       230.0 lb Date of Birth:  03/06/1986    BSA:          2.169 m Patient Age:    35 years     BP:           116/90 mmHg Patient Gender: M            HR:           80 bpm. Exam Location:  Inpatient Procedure: Transesophageal Echo, Cardiac Doppler, Color Doppler and 3D Echo Indications:     CVA  History:         Patient has prior history of Echocardiogram examinations, most                  recent 02/15/2021. Risk Factors:Hypertension and Dyslipidemia.                  Polysubstance abuse.  Sonographer:     Leta Jungling RDCS Referring Phys:  [id number] Corrin Parker Diagnosing Phys: Chilton Si MD PROCEDURE: After discussion of the risks and benefits of a TEE, an informed consent was obtained from the patient. TEE procedure time was 15 minutes. The transesophogeal probe was passed without difficulty through the esophogus of the patient. Imaged were obtained with the patient in a left lateral decubitus position. Sedation performed by different physician. The patient was monitored while under deep sedation. Anesthestetic sedation was provided intravenously by Anesthesiology:  332.04mg  of Propofol. Image quality was good. The patient's vital signs; including heart rate, blood pressure, and oxygen saturation; remained stable throughout the procedure. The patient developed no complications during the procedure. IMPRESSIONS  1. Left ventricular ejection fraction, by estimation, is 60 to 65%. The left ventricle has normal function. The left ventricle has no regional wall motion abnormalities. Left ventricular diastolic function could not be evaluated.  2. Right ventricular systolic function is normal. The right ventricular size is normal.  3. No left atrial/left atrial appendage thrombus was detected.  4. The mitral valve is normal in structure. Trivial mitral valve regurgitation. No evidence of mitral stenosis.  5. The aortic valve is normal in structure. Aortic valve regurgitation is not visualized. No aortic stenosis is present.  6. Evidence of atrial level shunting detected by color flow Doppler. Agitated saline contrast bubble study was positive with shunting observed within 3-6 cardiac cycles suggestive of interatrial shunt. There is a small patent foramen ovale with predominantly right to left shunting across the atrial septum. Conclusion(s)/Recommendation(s): Normal biventricular function without evidence of hemodynamically significant valvular heart disease. Findings are concerning for an interatrial shunt as detailed above. FINDINGS  Left Ventricle: Left ventricular  ejection fraction, by estimation, is 60 to 65%. The left ventricle has normal function. The left ventricle has no regional wall motion abnormalities. The left ventricular internal cavity size was normal in size. There is  no left ventricular hypertrophy. Left ventricular diastolic function could not be evaluated. Right Ventricle: The right ventricular size is normal. No increase in right ventricular wall thickness. Right ventricular systolic function is normal. Left Atrium: Left atrial size was normal in size. No left  atrial/left atrial appendage thrombus was detected. Right Atrium: Right atrial size was normal in size. Pericardium: There is no evidence of pericardial effusion. Mitral Valve: The mitral valve is normal in structure. Trivial mitral valve regurgitation. No evidence of mitral valve stenosis. Tricuspid Valve: The tricuspid valve is normal in structure. Tricuspid valve regurgitation is trivial. No evidence of tricuspid stenosis. Aortic Valve: The aortic valve is normal in structure. Aortic valve regurgitation is not visualized. No aortic stenosis is present. Pulmonic Valve: The pulmonic valve was normal in structure. Pulmonic valve regurgitation is not visualized. No evidence of pulmonic stenosis. Aorta: The aortic root is normal in size and structure. IAS/Shunts: Evidence of atrial level shunting detected by color flow Doppler. Agitated saline contrast was given intravenously to evaluate for intracardiac shunting. Agitated saline contrast bubble study was positive with shunting observed within 3-6 cardiac cycles suggestive of interatrial shunt. A small patent foramen ovale is detected with predominantly right to left shunting across the atrial septum. There is no evidence of an atrial septal defect.  TRICUSPID VALVE TR Peak grad:   20.4 mmHg TR Vmax:        226.00 cm/s Chilton Si MD Electronically signed by Chilton Si MD Signature Date/Time: 02/21/2021/7:17:35 PM    Final     Labs:  Basic Metabolic Panel: No results for input(s): NA, K, CL, CO2, GLUCOSE, BUN, CREATININE, CALCIUM, MG, PHOS in the last 168 hours.  CBC: No results for input(s): WBC, NEUTROABS, HGB, HCT, MCV, PLT in the last 168 hours.  CBG: No results for input(s): GLUCAP in the last 168 hours.  Family history.  Positive for hypertension as well as hyperlipidemia.  Denies any colon cancer esophageal cancer or rectal cancer  Brief HPI:   David Craig is a 35 y.o. right-handed male with history of polysubstance abuse/tobacco,  migraine headaches and obesity with BMI of 34.97.  Per chart review patient lives with spouse and 2 young children.  Reportedly independent prior to admission.  Working in Airline pilot for [employer].  Presented 02/14/2021 with left-sided weakness subacute onset as well as headache.  Cranial CT scan negative.  CT angiogram head and neck showed suspected dissection of the distal right internal carotid artery beginning at the distal cavernous segment, which became occluded proximal to the carotid terminus.  The right middle and anterior cerebral arteries feel via flow across the anterior communicating artery.  Large area of ischemic penumbra within the right hemisphere in a watershed/hypoperfusion type distribution.  Patient did not receive tPA.  Admission chemistries unremarkable except glucose 131 urine drug screen positive cocaine as well as marijuana.  Attempts at mechanical thrombectomy for right ICA occlusion failed per interventional radiology.  MRI showed multifocal ischemic infarcts of the right PCA territory involving occipital medial temporal posterior limb of internal capsule and ventrolateral thalamic regions.  Echocardiogram with ejection fraction of 65 to 70% no wall motion abnormality.  Lower extremity Dopplers negative.  Transcranial Doppler study revealed grade 1 PFO.  TEE completed 02/21/2021 showing no thrombus or mass with small  PFO.  Currently maintained on aspirin as well as Plavix x90 days then aspirin alone.  Subcutaneous Lovenox for DVT prophylaxis.  Therapy evaluations completed due to patient's left-sided weakness was admitted for a comprehensive rehab program.   Hospital Course: Ivar Domangue was admitted to rehab 02/22/2021 for inpatient therapies to consist of PT, ST and OT at least three hours five days a week. Past admission physiatrist, therapy team and rehab RN have worked together to provide customized collaborative inpatient rehab.  Pertaining to patient's right PCA infarction secondary  to terminal right ICA occlusion status post failed mechanical thrombectomy etiology likely cocaine use/vasospasm.  He remained on aspirin 325 mg mg daily and Plavix 75 mg day x90 days and aspirin alone with follow-up neurology services.  Subcutaneous Lovenox for DVT prophylaxis venous Doppler studies negative.  Mood stabilization with amantadine as well as nightly Seroquel.  He was using melatonin to help aid in sleep.  Lipitor ongoing for hyperlipidemia.  Noted obesity BMI 34.97 with dietary follow-up.  History of polysubstance abuse urine drug screen positive cocaine as well as marijuana patient did receive counts regards to cessation of these illicit products.  He was tolerating a regular diet.   Blood pressures were monitored on TID basis and controlled     Rehab course: During patient's stay in rehab weekly team conferences were held to monitor patient's progress, set goals and discuss barriers to discharge. At admission, patient required moderate assist stand pivot transfers moderate assist supine to sit max assist to feet with a 4 wheeled walker  Physical exam.  Blood pressure 134/84 pulse 65 temperature 98.1 respiration 16 oxygen saturation 97% room air Constitutional.  No acute distress HEENT Head.  Normocephalic and atraumatic Eyes.  Pupils round and reactive to light no discharge without nystagmus Neck.  Supple nontender no JVD without thyromegaly Cardiac regular rate rhythm without extra sounds or murmur heard Abdomen.  Soft nontender positive bowel sounds without rebound Respiratory effort normal no respiratory distress without wheeze Skin.  Warm and dry Neurologic.  Patient alert with right gaze preference.  Follows commands.  Provides name and age.  Left central 7 and dysarthria.  Left homonymous hemianopsia and lateral eye movements slow with left lateral gaze.  Senses pain but light touch may be slightly diminished left upper left lower extremity.  Left upper extremity 1+  deltoid, pec and 0/5 otherwise.  Left lower extremity 1+ to 2 - hip extension knee extension and 0/5 otherwise.  No resting tone  He/She  has had improvement in activity tolerance, balance, postural control as well as ability to compensate for deficits. He/She has had improvement in functional use RUE/LUE  and RLE/LLE as well as improvement in awareness.  Stand pivot transfers to wheelchair with contact-guard using toe up AFO brace.  Ambulates rolling walker minimal assist 80 feet progressing to moderate assist secondary to fatigue.  With noted rest breaks he can increase his ambulation up to 160 feet.  Car transfers using rolling walker performed with family teaching with minimal assist and max assist for cues.  ADLs completed supine to sit on the left side of the bed with moderate assist.  Completed donning socks using 1 hand method with moderate assist for the left and then supervision for the right.  He was able to don the right shoe with set up as well but needed max assist to tie.  Educated patient's spouse and brother on squat pivot and stand pivot transfers without assistive device to both directions.  They  were able to return demonstrate completion with patient performing minimal assist squat pivot transfers.  SLP provided education on progress with speech therapy and continued areas of improvement from a cognitive communication speech and swallowing standpoint.  Discussed strategies for left visual scanning attention and safety within home environment.  Full family teaching completed plan discharged to home       Disposition: Discharged home    Diet: Regular  Special Instructions: No driving smoking alcohol or illicit drug use  Plan aspirin and Plavix 75 mg day x90 days total then switch to aspirin alone  Medications at discharge. 1.  Tylenol as needed 2.  Amantadine 100 mg p.o. twice daily 3.  Aspirin 325 mg p.o. daily 4.  Lipitor 80 mg p.o. daily 5.  B complex with vitamin C 1  tablet daily 6.  Plavix 75 mg p.o. daily 7.  Colace 100 mg p.o. twice daily 8.  Magnesium gluconate 500 mg p.o. daily 9.  Melatonin 3 mg p.o. nightly 10.  NicoDerm patch taper as directed 11.  Protonix 40 mg p.o. daily 12.  MiraLAX daily hold for loose stools 13.  Seroquel 100 mg p.o. nightly 14.  Robaxin 500 mg every 8 hours as needed spasms  30-35 minutes were spent completing discharge summary and discharge planning  Discharge Instructions     Ambulatory referral to Neurology   Complete by: As directed    Follow up with stroke clinic NP (Jessica Vanschaick or Darrol Angel, if both not available, consider Manson Allan, or [facility]) [facility] in about 4 weeks. Thanks.   Ambulatory referral to Neurology   Complete by: As directed    An appointment is requested in approximately: 4 weeks right PCA infarction secondary to right ICA occlusion   Ambulatory referral to Occupational Therapy   Complete by: As directed    Eval and treat   Ambulatory referral to Physical Medicine Rehab   Complete by: As directed    Moderate complexity follow-up 1 to 2 weeks right PCA secondary to right ICA occlusion   Ambulatory referral to Physical Therapy   Complete by: As directed    Ambulatory referral to Speech Therapy   Complete by: As directed    Eval and treat        Follow-up Information     Ranelle Oyster, MD Follow up.   Specialty: Physical Medicine and Rehabilitation Why: Office to call for appointment Contact information: [address] [place] [address] [phone]                 Signed: Charlton Amor 03/22/2021, 5:54 AM

## 2021-03-20 NOTE — Plan of Care (Signed)
  Problem: RH Balance Goal: LTG Patient will maintain dynamic standing with ADLs (OT) Description: LTG:  Patient will maintain dynamic standing balance with assist during activities of daily living (OT)  Flowsheets (Taken 03/20/2021 0746) LTG: Pt will maintain dynamic standing balance during ADLs with: (Goal downgraded based on progress) Minimal Assistance - Patient > 75% Note: Goal downgraded based on progress   Problem: RH Bathing Goal: LTG Patient will bathe all body parts with assist levels (OT) Description: LTG: Patient will bathe all body parts with assist levels (OT) Flowsheets (Taken 03/20/2021 0746) LTG: Pt will perform bathing with assistance level/cueing: (Goal downgraded based on progress) Minimal Assistance - Patient > 75% Note: Goal downgraded based on progress   Problem: RH Dressing Goal: LTG Patient will perform lower body dressing w/assist (OT) Description: LTG: Patient will perform lower body dressing with assist, with/without cues in positioning using equipment (OT) Flowsheets (Taken 03/20/2021 0746) LTG: Pt will perform lower body dressing with assistance level of: (Goal downgraded based on progress) Moderate Assistance - Patient 50 - 74% Note: Goal downgraded based on progress   Problem: RH Toilet Transfers Goal: LTG Patient will perform toilet transfers w/assist (OT) Description: LTG: Patient will perform toilet transfers with assist, with/without cues using equipment (OT) Flowsheets (Taken 03/20/2021 0746) LTG: Pt will perform toilet transfers with assistance level of: (Goal downgraded based on progress) Minimal Assistance - Patient > 75% Note: Goal downgraded based on progress   Problem: RH Tub/Shower Transfers Goal: LTG Patient will perform tub/shower transfers w/assist (OT) Description: LTG: Patient will perform tub/shower transfers with assist, with/without cues using equipment (OT) Flowsheets (Taken 03/20/2021 0746) LTG: Pt will perform tub/shower stall  transfers with assistance level of: (Goal downgraded based on progress) Minimal Assistance - Patient > 75% LTG: Pt will perform tub/shower transfers from: Walk in shower Note: Goal downgraded based on progress

## 2021-03-21 DIAGNOSIS — S93402A Sprain of unspecified ligament of left ankle, initial encounter: Secondary | ICD-10-CM

## 2021-03-21 MED ORDER — QUETIAPINE FUMARATE 100 MG PO TABS: 100.0000 mg | ORAL_TABLET | Freq: Every day | ORAL | 0 refills | Status: DC

## 2021-03-21 MED ORDER — B COMPLEX-C PO TABS: 1.0000 | ORAL_TABLET | Freq: Every day | ORAL | 0 refills | Status: AC

## 2021-03-21 MED ORDER — NICOTINE 14 MG/24HR TD PT24: MEDICATED_PATCH | TRANSDERMAL | 0 refills | Status: DC

## 2021-03-21 MED ORDER — AMANTADINE HCL 100 MG PO CAPS: 100.0000 mg | ORAL_CAPSULE | Freq: Two times a day (BID) | ORAL | 0 refills | Status: DC

## 2021-03-21 MED ORDER — CLOPIDOGREL BISULFATE 75 MG PO TABS: 75.0000 mg | ORAL_TABLET | Freq: Every day | ORAL | 1 refills | Status: DC

## 2021-03-21 MED ORDER — PANTOPRAZOLE SODIUM 40 MG PO TBEC: 40.0000 mg | DELAYED_RELEASE_TABLET | Freq: Every day | ORAL | 0 refills | Status: DC

## 2021-03-21 MED ORDER — MAGNESIUM GLUCONATE 500 MG PO TABS
500.0000 mg | ORAL_TABLET | Freq: Every day | ORAL | 0 refills | Status: DC
Start: 2021-03-21 — End: 2021-05-03

## 2021-03-21 MED ORDER — MELATONIN 3 MG PO TABS: 3.0000 mg | ORAL_TABLET | Freq: Every day | ORAL | 0 refills | Status: DC

## 2021-03-21 MED ORDER — METHOCARBAMOL 500 MG PO TABS: 500.0000 mg | ORAL_TABLET | Freq: Three times a day (TID) | ORAL | Status: DC | PRN

## 2021-03-21 MED ORDER — ATORVASTATIN CALCIUM 80 MG PO TABS: 80.0000 mg | ORAL_TABLET | Freq: Every day | ORAL | 0 refills | Status: DC

## 2021-03-21 NOTE — Progress Notes (Signed)
Speech Language Pathology Discharge Summary  Patient Details  Name: David Craig MRN: [id number] Date of Birth: 1985/07/03  Today's Date: 03/21/2021 SLP Individual Time: 3358-2518 SLP Individual Time Calculation (min): 25 min   Skilled Therapeutic Interventions:   Skilled treatment session focused on cognitive goals. SLP facilitated session by readministering the visualspatial subtests of the Cognitive Linguistic Quick Test (CLQT). Patient scored 12/12 points during a symbol cancellation task, 13/13 points during a clock drawing test, 8/10 points with symbol trails, and 8/8 points with mazes. Overall, patient demonstrated improvement in all subtests since initial evaluation.  Patient left upright in wheelchair with wife present.   Patient has met 6 of 7 long term goals.  Patient to discharge at overall Supervision level.   Reasons goals not met: Patient continues to require overall Min A verbal cues for use of swallowing compenstory strategies.   Clinical Impression/Discharge Summary: Patient has made functional gains and has met 6 of 6 LTGs this admission. Currently, patient is consuming regular textures with thin liquids with minimal overt s/s of aspiration and requires overall supervision-Min A verbal cues for use of swallowing compensatory strategies. Patient is essentially 100% intelligible at the sentence level but intelligibility can decrease to 90% at times when fatigued with cues needed for use of speech intelligibility strategies. Patient continues to demonstrate overall moderate cognitive impairments but requires overall supervision level verbal cues for visual scanning/attention to left field of environment, functional problem solving, emergent awareness of errors, selective attention and recall. Patient also demonstrates improved overall social interaction. Patient and family education is complete and patient will discharge home with 24 hour supervision from family. Patient would benefit  from f/u SLP services to maximize his cognitive and swallowing function as well as his speech intelligibility in order to reduce caregiver burden.   Care Partner:  Caregiver Able to Provide Assistance: Yes  Type of Caregiver Assistance: Physical;Cognitive  Recommendation:  24 hour supervision/assistance;Outpatient SLP  Rationale for SLP Follow Up: Reduce caregiver burden;Maximize cognitive function and independence;Maximize swallowing safety;Maximize functional communication   Equipment: N/A   Reasons for discharge: Discharged from hospital;Treatment goals met   Patient/Family Agrees with Progress Made and Goals Achieved: Yes    [place], [place] 03/21/2021, 6:11 AM

## 2021-03-21 NOTE — Progress Notes (Signed)
Occupational Therapy Discharge Summary  Patient Details  Name: David Craig MRN: [id number] Date of Birth: 01-18-1986  Today's Date: 03/21/2021 OT Individual Time: 0800-0902 OT Individual Time Calculation (min): 62 min   Session Note:  Pt in bed to start session, agreeable to therapy.  Mod assist for transfer from supine to sit on the left side of the bed.  Once sitting, educated pt and spouse on proper fit and application of the resting hand splint as on most days this therapist notes pt's hand has worked it's way out of the splint.  Pt's spouse is familiar with donning and has been assisting pt with this.  Encouraged slight wrist extension in the splint and strapping for the red and gray straps at the wrist first in order to maintain hand in the splint better.  Next, had him work on donning his right sock and both shoes.  He was able to donn the right one over his foot with setup but needed total assist for tying.  Total assist was needed for donning the left shoe and AFO.  Discussed handout given earlier with AAROM exercises for the LUE.  She is aware that she can access videos if needed.  Emphasized the need to be cautious with shoulder flexion and abduction and to not force the UE through the range of motion if there is pain.  She voiced understanding. Pt complete squat pivot transfer to the wheelchair at min assist level and was taken down to the therapy gym.  He transferred to the therapy mat at min assist stand pivot.  Increased left LE pain in the hip and calf reported this am, so completed light stretching of hamstrings, as well as internal and external rotators of the LLE in supine.  Completed simple movements of shoulder flexion with elbow extension and flexion as well EOB for discharge information.  He is able to activate shoulder flexion and elbow extension at a Brunnstrum stage II-III level but elbow flexors are weaker.  Slight tone also noted in the wrist flexors, digit flexors, and internal  rotators of the shoulder.  Finished session with transfer back to the wheelchair with min assist and return to the room and NMES was applied to the left shoulder for facilitation at level 12 intensity.  He was able to tolerate for 60 mins without pain or redness noted upon completion.  See below for parameters of stimulation.  He was left with SLP for next therapy session. [place] Stim One 330 pulse width 35 Hz pulse rate On 8 sec/ off 8 sec Ramp up/ down 2 sec Symmetrical Biphasic wave form  Max intensity 163m at 500 Ohm load  Patient has met 11 of 11 long term goals due to improved balance, postural control, ability to compensate for deficits, functional use of  LEFT upper and LEFT lower extremity, improved attention, improved awareness, and improved coordination.  Patient to discharge at overall Mod Assist level.  Patient's care partner is independent to provide the necessary physical and cognitive assistance at discharge.    Reasons goals not met: NA  Recommendation:  Patient will benefit from ongoing skilled OT services in outpatient setting to continue to advance functional skills in the area of BADL, iADL, Vocation, and Reduce care partner burden.  Pt will continue to benefit from follow-up outpatient to continue progression with ADL function to a modified independent level as well as working on increasing LUE function overall.    Equipment: No equipment provided  Reasons for discharge: treatment goals  met and discharge from hospital  Patient/family agrees with progress made and goals achieved: Yes  OT Discharge Precautions/Restrictions  Precautions Precautions: Fall Precaution Comments: L hemi, L homonymous hemianopsia, R gaze preference Restrictions Weight Bearing Restrictions: No  Pain Pain Assessment Pain Scale: 0-10 Pain Score: 5  Pain Type: Acute pain Pain Location: Foot Pain Orientation: Left Pain Intervention(s): Elevated extremity ADL ADL Eating:  Supervision/safety Where Assessed-Eating: Wheelchair Grooming: Supervision/safety Where Assessed-Grooming: Wheelchair Upper Body Bathing: Minimal assistance Where Assessed-Upper Body Bathing: Chair, Multimedia programmer Lower Body Bathing: Minimal assistance Where Assessed-Lower Body Bathing: Shower, Chair Upper Body Dressing: Supervision/safety Where Assessed-Upper Body Dressing: Edge of bed Lower Body Dressing: Moderate assistance Where Assessed-Lower Body Dressing: Edge of bed Toileting: Minimal assistance Where Assessed-Toileting: Bedside Commode Toilet Transfer: Minimal assistance Toilet Transfer Method: Stand pivot, Ambulating Toilet Transfer Equipment: Radiographer, therapeutic: Not assessed Social research officer, government Method: Radiographer, therapeutic: Shower seat with back Vision Baseline Vision/History: 0 No visual deficits Patient Visual Report: Blurring of vision (Pt reports increased blurriness on the right) Vision Assessment?: Yes Eye Alignment: Within Functional Limits Ocular Range of Motion: Within Functional Limits Tracking/Visual Pursuits: Decreased smoothness of horizontal tracking;Decreased smoothness of vertical tracking (slight jerkiness with tracking) Saccades: Decreased speed of saccadic movement Convergence: Impaired (comment) Visual Fields: Left homonymous hemianopsia Additional Comments: Pt with significant left visual field deficit at this time. Perception  Perception: Impaired Inattention/Neglect: Does not attend to left visual field;Does not attend to left side of body Praxis Praxis: Intact Cognition Overall Cognitive Status: Impaired/Different from baseline Arousal/Alertness: Awake/alert Orientation Level: Oriented X4 Year: 2022 Month: October Day of Week: Correct Attention: Selective Sustained Attention: Appears intact Selective Attention: Impaired Selective Attention Impairment: Functional complex Memory: Appears intact Immediate  Memory Recall: Sock;Blue;Bed Memory Recall Sock: Without Cue Memory Recall Blue: Without Cue Memory Recall Bed: Without Cue Awareness: Impaired Awareness Impairment: Anticipatory impairment Problem Solving: Impaired Problem Solving Impairment: Functional complex Behaviors: Impulsive Safety/Judgment: Impaired Comments: Pt still with impulsivity with movement demonstrating decreased anticipatory awareness. Sensation Sensation Light Touch: Impaired Detail Light Touch Impaired Details: Impaired LUE Hot/Cold: Not tested Proprioception: Appears Intact Stereognosis: Not tested Additional Comments: Pt able to detect light touch throughout the LUE but sligtly diminshed at the hand per report when compared to the right. Coordination Gross Motor Movements are Fluid and Coordinated: No Fine Motor Movements are Fluid and Coordinated: No Coordination and Movement Description: Max hand over hand assistance for integration of the LUE into functional use.  Brunnstrum stage II in the arm and stage I in the hand. Motor  Motor Motor: Hemiplegia;Abnormal tone Motor - Discharge Observations: Still with significant LUE and LLE hermiparesis. Mobility  Bed Mobility Bed Mobility: Supine to Sit;Sit to Supine Supine to Sit: Moderate Assistance - Patient 50-74% Sit to Supine: Minimal Assistance - Patient > 75% Transfers Sit to Stand: Supervision/Verbal cueing Stand to Sit: Supervision/Verbal cueing  Trunk/Postural Assessment  Cervical Assessment Cervical Assessment: Exceptions to [facility] (slight forward head) Thoracic Assessment Thoracic Assessment: Exceptions to [facility] (thoracic rounding) Lumbar Assessment Lumbar Assessment: Exceptions to [facility] (posterior pelvic tilt)  Balance Balance Balance Assessed: Yes Static Sitting Balance Static Sitting - Balance Support: Feet supported Static Sitting - Level of Assistance: 5: Stand by assistance Dynamic Sitting Balance Dynamic Sitting - Balance Support: Feet  unsupported;During functional activity Dynamic Sitting - Level of Assistance: 5: Stand by assistance Static Standing Balance Static Standing - Balance Support: During functional activity;Right upper extremity supported Static Standing - Level of Assistance: 5: Stand by assistance Dynamic  Standing Balance Dynamic Standing - Balance Support: During functional activity;No upper extremity supported Dynamic Standing - Level of Assistance: 3: Mod assist Extremity/Trunk Assessment RUE Assessment RUE Assessment: Within Functional Limits LUE Assessment LUE Assessment: Exceptions to [facility] Passive Range of Motion (PROM) Comments: WFLS for all joints General Strength Comments: Brunnstrum stage I in the hand with stage II in the arm.  Fluctuating tone noted in the digit flexors and wrist flexors as well as internal rotators.  He needs max hand over hand assist for integration into functional activities as a stabilizer.  Tolerating resting hand splint for wear at night. LUE Body System: Neuro Brunstrum levels for arm and hand: Arm;Hand Brunstrum level for arm: Stage II Synergy is developing Brunstrum level for hand: Stage I Flaccidity LUE Tone LUE Tone: Modified Ashworth Body Part - Modified Ashworth Scale: Wrist;Fingers;Elbow Elbow - Modified Ashworth Scale for Grading Hypertonia LUE: Slight increase in muscle tone, manifested by a catch, followed by minimal resistance throughout the remainder (less than half) of the ROM Wrist - Modified Ashworth Scale for Grading Hypertonia LUE: Slight increase in muscle tone, manifested by a catch, followed by minimal resistance throughout the remainder (less than half) of the ROM Fingers - Modified Ashworth Scale for Grading Hypertonia LUE: Slight increase in muscle tone, manifested by a catch, followed by minimal resistance throughout the remainder (less than half) of the ROM   David Craig OTR/L 03/21/2021, 12:51 PM

## 2021-03-21 NOTE — Progress Notes (Signed)
Met with pt to follow up on discharge. Pt verbalized he is ready to go home, equipment issue addressed. Pt reports that wife will be here in the am. Pt is comfortable with discharge and no other issues noted.

## 2021-03-21 NOTE — Progress Notes (Signed)
Physical Therapy Session Note  Patient Details  Name: David Craig MRN: [id number] Date of Birth: 03/18/1986  Today's Date: 03/21/2021 PT Individual Time: 1622-1700 PT Individual Time Calculation (min): 38 min   Short Term Goals:  Week 3:  PT Short Term Goal 1 (Week 3): STGs = LTGs  Skilled Therapeutic Interventions/Progress Updates:   Pt received supine in bed and agreeable to PT. Supine>sit transfer with min assist and cues for use of bed rail on the L side.   Transfers to and from [facility] with CGA with no AD and UE supported on arm rest. Pt attempted to hold PT, but able to perfrom with only use of WC parts. Sit<>stand with RW x 4 with supervision assist. Pt able to manage LUE in hand splint on RW.   Standing balance with 1 UE support on RW while engaged in Wii sports bowling x 71fames with supervision assist. Fine/gross motor with wii sports baseball [facility] attention to task intermittently.    Pt returned to room and performed stand pivot transfer to bed with CGA. Sit>supine completed with supervision assist and left supine in bed with call bell in reach and all needs met.       Therapy Documentation Precautions:  Precautions Precautions: Fall Precaution Comments: L hemi, L homonymous hemianopsia, R gaze preference Restrictions Weight Bearing Restrictions: No General:    Pain:   denies    Therapy/Group: Individual Therapy  ALorie Phenix10/19/2022, 6:07 PM

## 2021-03-21 NOTE — Plan of Care (Signed)
Care plan completed/met 

## 2021-03-21 NOTE — Progress Notes (Signed)
Physical Therapy Discharge Summary  Patient Details  Name: David Craig MRN: [id number] Date of Birth: [birth year]  Today's Date: 03/21/2021 PT Individual Time: 3500-9381 PT Individual Time Calculation (min): 57 min    Patient has met 6 of 8 long term goals due to improved activity tolerance, improved balance, improved postural control, increased strength, ability to compensate for deficits, functional use of  left lower extremity, improved attention, improved awareness, and improved coordination.  Patient to discharge at an ambulatory level [place].   Patient's care partner is independent to provide the necessary physical assistance at discharge.  Reasons goals not met: Pt requires minA for ambulation but family educated to treat pt as "transfers only", until pt progresses with ambulation in outpatient setting.  Recommendation:  Patient will benefit from ongoing skilled PT services in outpatient setting to continue to advance safe functional mobility, address ongoing impairments in strength, balance, ambulation, and minimize fall risk.  Equipment: 16" x 16" manual WC  Reasons for discharge: treatment goals met and discharge from hospital  Patient/family agrees with progress made and goals achieved: Yes  Skilled Therapeutic Interventions: Pt received supine in bed and agrees to therapy. Supine to sit with modA and cues for sequencing. Pt assisted with donning shoes at EOB for time management, including L AFO. Pt performs stand pivot transfer to [facility] with CGA and cues for positioning and initiation. WC transport to gym. PT demos stair navigation technique and then pt completes x5 6" steps with CGA and cues for sequencing. PT notes that pt has been incontinent of urine so pt taken via WC back to room. Ambulatory transfer to toilet with CGA and RW. PT assists with doffing and donning clean clothes with pt utilizing R grab bar for sit to stand multiple times with verbal cues for body mechanics.  WC transport back to gym. Pt completes ramp navigation with CGA and RW, with cues to increase L step height. Pt ambulates x150' with RW, L hand splint, and AFO, with PT providing minA hips to facilitate rotation and provide pt with optimal positioning to engage L hip flexors for leg advancement during swing phase. Pt left seated in WC with alarm intact and all needs within reach.  PT Discharge Precautions/Restrictions Precautions Precautions: Fall Precaution Comments: L hemi, L homonymous hemianopsia, R gaze preference Restrictions Weight Bearing Restrictions: No Pain Interference Pain Interference Pain Effect on Sleep: 1. Rarely or not at all Pain Interference with Therapy Activities: 1. Rarely or not at all Pain Interference with Day-to-Day Activities: 1. Rarely or not at all Vision/Perception  Perception Perception: Impaired Inattention/Neglect: Does not attend to left visual field;Does not attend to left side of body Praxis Praxis: Intact  Cognition Overall Cognitive Status: Impaired/Different from baseline Arousal/Alertness: Awake/alert Year: 2022 Month: October Day of Week: Correct Attention: Selective Sustained Attention: Appears intact Selective Attention: Impaired Selective Attention Impairment: Functional complex Memory: Appears intact Immediate Memory Recall: Sock;Blue;Bed Memory Recall Sock: Without Cue Memory Recall Blue: Without Cue Memory Recall Bed: Without Cue Awareness: Impaired Awareness Impairment: Anticipatory impairment Problem Solving: Impaired Problem Solving Impairment: Functional complex Behaviors: Impulsive Safety/Judgment: Impaired Comments: Pt still with impulsivity with movement demonstrating decreased anticipatory awareness. Sensation Sensation Light Touch: Impaired Detail Light Touch Impaired Details: Impaired LUE Hot/Cold: Not tested Proprioception: Appears Intact Stereognosis: Not tested Additional Comments: Pt able to detect light  touch throughout the LUE but sligtly diminshed at the hand per report when compared to the right. Coordination Gross Motor Movements are Fluid and Coordinated: No Fine Motor  Movements are Fluid and Coordinated: No Coordination and Movement Description: Max hand over hand assistance for integration of the LUE into functional use.  Brunnstrum stage II in the arm and stage I in the hand. Motor  Motor Motor: Hemiplegia;Abnormal tone Motor - Discharge Observations: Still with significant LUE and LLE hermiparesis.  Mobility Bed Mobility Bed Mobility: Supine to Sit;Sit to Supine Supine to Sit: Moderate Assistance - Patient 50-74% Sit to Supine: Minimal Assistance - Patient > 75% Transfers Transfers: Sit to Stand;Stand to Sit;Stand Pivot Transfers;Squat Pivot Transfers Sit to Stand: Supervision/Verbal cueing Stand to Sit: Supervision/Verbal cueing Stand Pivot Transfers: Contact Guard/Touching assist Stand Pivot Transfer Details: Verbal cues for sequencing Squat Pivot Transfers: Minimal Assistance - Patient > 75% Transfer (Assistive device): Rolling walker Locomotion  Gait Ambulation: Yes Gait Assistance: Minimal Assistance - Patient > 75% Gait Distance (Feet): 150 Feet Assistive device: Rolling walker Gait Assistance Details: Tactile cues for initiation;Tactile cues for posture;Verbal cues for sequencing;Verbal cues for gait pattern;Verbal cues for technique;Manual facilitation for weight shifting Gait Gait: Yes Gait Pattern: Impaired (L hemi gait, but improved from eval) Gait velocity: Decreased Stairs / Additional Locomotion Stairs: Yes Stairs Assistance: Contact Guard/Touching assist Stair Management Technique: One rail Left Number of Stairs: 5 Height of Stairs: 6 Ramp: Contact Guard/touching assist Curb: Nurse, mental health Mobility: Yes Wheelchair Assistance: Independent with assistive device Wheelchair Propulsion: Right upper  extremity;Right lower extremity Wheelchair Parts Management: Needs assistance Distance: 150'  Trunk/Postural Assessment  Cervical Assessment Cervical Assessment: Exceptions to [facility] (slight forward head) Thoracic Assessment Thoracic Assessment: Exceptions to [facility] (thoracic rounding) Lumbar Assessment Lumbar Assessment: Exceptions to [facility] (posterior pelvic tilt)  Balance Balance Balance Assessed: Yes Static Sitting Balance Static Sitting - Balance Support: Feet supported Static Sitting - Level of Assistance: 5: Stand by assistance Dynamic Sitting Balance Dynamic Sitting - Balance Support: Feet unsupported;During functional activity Dynamic Sitting - Level of Assistance: 5: Stand by assistance Static Standing Balance Static Standing - Balance Support: During functional activity;Right upper extremity supported Static Standing - Level of Assistance: 5: Stand by assistance Dynamic Standing Balance Dynamic Standing - Balance Support: During functional activity;No upper extremity supported Dynamic Standing - Level of Assistance: 3: Mod assist Extremity Assessment  RUE Assessment RUE Assessment: Within Functional Limits LUE Assessment LUE Assessment: Exceptions to [facility] Passive Range of Motion (PROM) Comments: WFLS for all joints General Strength Comments: Brunnstrum stage I in the hand with stage II in the arm.  Fluctuating tone noted in the digit flexors and wrist flexors as well as internal rotators.  He needs max hand over hand assist for integration into functional activities as a stabilizer.  Tolerating resting hand splint for wear at night. LUE Body System: Neuro Brunstrum levels for arm and hand: Arm;Hand Brunstrum level for arm: Stage II Synergy is developing Brunstrum level for hand: Stage I Flaccidity LUE Tone LUE Tone: Modified Ashworth Body Part - Modified Ashworth Scale: Wrist;Fingers;Elbow Elbow - Modified Ashworth Scale for Grading Hypertonia LUE: Slight increase in muscle  tone, manifested by a catch, followed by minimal resistance throughout the remainder (less than half) of the ROM Wrist - Modified Ashworth Scale for Grading Hypertonia LUE: Slight increase in muscle tone, manifested by a catch, followed by minimal resistance throughout the remainder (less than half) of the ROM Fingers - Modified Ashworth Scale for Grading Hypertonia LUE: Slight increase in muscle tone, manifested by a catch, followed by minimal resistance throughout the remainder (less than half) of the ROM RLE Assessment RLE Assessment:  Within Functional Limits LLE Assessment LLE Assessment: Exceptions to [facility] Passive Range of Motion (PROM) Comments: [facility] General Strength Comments: Grossly 2/5. No ankle activation noted.    Breck Coons, PT, DPT 03/21/2021, 4:24 PM

## 2021-03-21 NOTE — Progress Notes (Signed)
Inpatient Rehabilitation Care Coordinator Discharge Note   Patient Details  Name: Giacomo Valone MRN: [id number] Date of Birth: [birth year]   Discharge location: D/c to home with his wife and support from his parents  Length of Stay: 27 days  Discharge activity level: Mod A  Home/community participation: Limited  Patient response VV:ZSMOLM Literacy - How often do you need to have someone help you when you read instructions, pamphlets, or other written material from your doctor or pharmacy?: Never  Patient response BE:MLJQGB Isolation - How often do you feel lonely or isolated from those around you?: Never  Services provided included: MD, RD, PT, SLP, OT, RN, CM, Pharmacy, Neuropsych, SW, TR  Financial Services:  Field seismologist Utilized: [employer]  Choices offered to/list presented to: Yes  Follow-up services arranged:  Outpatient, DME    Outpatient Servicies: [facility] for PT/OT/SLP DME : [facility] for w/c    Patient response to transportation need: Is the patient able to respond to transportation needs?: Yes In the past 12 months, has lack of transportation kept you from medical appointments or from getting medications?: No In the past 12 months, has lack of transportation kept you from meetings, work, or from getting things needed for daily living?: No  Comments (or additional information):  Patient/Family verbalized understanding of follow-up arrangements:  Yes  Individual responsible for coordination of the follow-up plan: contact pt wife Seward Grater [phone]  Confirmed correct DME delivered: Gretchen Short 03/21/2021    Gretchen Short

## 2021-03-21 NOTE — Progress Notes (Signed)
Patient ID: David Craig, male   DOB: 03-31-1986, 35 y.o.   MRN: [id number]  SW confirms w/c delivered. [employer] missing. SW waiting on updates from [employer] on when this item will be available.  *[facility] reports item is out of stock and can be shipped to the home, or picked up in retail store. SW spoke with pt wife Seward Grater to discuss. She prefers item to be shipped to the home.    Cecile Sheerer, MSW, LCSWA Office: [phone] Cell: [phone] Fax: [phone]

## 2021-03-21 NOTE — Progress Notes (Signed)
PROGRESS NOTE   Subjective/Complaints: Patient seen laying in bed this morning.  He states he slept fairly well overnight, confirmed with sleep chart.  Family at bedside with questions regarding foot pain-noticed patient ambulated differently in therapies yesterday, with?  Sprain.  ROS: Denies CP, SOB, N/V/D  Objective:   No results found. No results for input(s): WBC, HGB, HCT, PLT in the last 72 hours.  No results for input(s): NA, K, CL, CO2, GLUCOSE, BUN, CREATININE, CALCIUM in the last 72 hours.   Intake/Output Summary (Last 24 hours) at 03/21/2021 1149 Last data filed at 03/21/2021 0700 Gross per 24 hour  Intake 240 ml  Output 375 ml  Net -135 ml          Physical Exam: Vital Signs Blood pressure 123/85, pulse 75, temperature (!) 97.5 F (36.4 C), resp. rate 18, height 5\' 8"  (1.727 m), weight 98.7 kg, SpO2 95 %. Constitutional: No distress . Vital signs reviewed. HENT: Normocephalic.  Atraumatic. Eyes: EOMI. No discharge. Cardiovascular: No JVD.  RRR. Respiratory: Normal effort.  No stridor.  Bilateral clear to auscultation. GI: Non-distended.  BS +. Skin: Warm and dry.  Intact. Psych: Normal mood.  Normal behavior. Musc: Left ankle with mild edema and tenderness.  Neuro: Alert and oriented except for date of month Dysarthria, unchanged Left facial weakness persistent Motor: LUE: Shoulder abduction and pecs 1+/5, distally 0/5, unchanged Increased flexor tone noted in wrist LLE: Hip flexion, knee extension 3/5, distally 0/5  Assessment/Plan: 1. Functional deficits which require 3+ hours per day of interdisciplinary therapy in a comprehensive inpatient rehab setting. Physiatrist is providing close team supervision and 24 hour management of active medical problems listed below. Physiatrist and rehab team continue to assess barriers to discharge/monitor patient progress toward functional and medical  goals  Care Tool:  Bathing    Body parts bathed by patient: Left arm, Chest, Abdomen, Front perineal area, Right upper leg, Left upper leg, Face, Right lower leg, Buttocks   Body parts bathed by helper: Left lower leg, Right arm     Bathing assist Assist Level: Minimal Assistance - Patient > 75%     Upper Body Dressing/Undressing Upper body dressing   What is the patient wearing?: Pull over shirt    Upper body assist Assist Level: Supervision/Verbal cueing    Lower Body Dressing/Undressing Lower body dressing      What is the patient wearing?: Underwear/pull up, Pants     Lower body assist Assist for lower body dressing: Moderate Assistance - Patient 50 - 74%     Toileting Toileting    Toileting assist Assist for toileting: Minimal Assistance - Patient > 75% Assistive Device Comment: urinal   Transfers Chair/bed transfer  Transfers assist     Chair/bed transfer assist level: Minimal Assistance - Patient > 75% (stand pivot with the RW)     Locomotion Ambulation   Ambulation assist      Assist level: Minimal Assistance - Patient > 75% Assistive device: Walker-rolling Max distance: 75'   Walk 10 feet activity   Assist     Assist level: Minimal Assistance - Patient > 75% Assistive device: Walker-rolling   Walk 50 feet activity  Assist Walk 50 feet with 2 turns activity did not occur: Safety/medical concerns  Assist level: Minimal Assistance - Patient > 75% Assistive device: Walker-rolling    Walk 150 feet activity   Assist Walk 150 feet activity did not occur: Safety/medical concerns         Walk 10 feet on uneven surface  activity   Assist Walk 10 feet on uneven surfaces activity did not occur: Safety/medical concerns         Wheelchair     Assist Is the patient using a wheelchair?: Yes Type of Wheelchair: Manual    Wheelchair assist level: Contact Guard/Touching assist Max wheelchair distance: 100    Wheelchair  50 feet with 2 turns activity    Assist        Assist Level: Contact Guard/Touching assist   Wheelchair 150 feet activity     Assist      Assist Level: Dependent - Patient 0%   Blood pressure 123/85, pulse 75, temperature (!) 97.5 F (36.4 C), resp. rate 18, height 5\' 8"  (1.727 m), weight 98.7 kg, SpO2 95 %.  Medical Problem List and Plan: 1.  Left-sided hemiparesis secondary to right PCA infarction secondary to terminal right ICA occlusion status post failed mechanical thrombectomy etiology likely secondary to cocaine use/vasospasm WHO/PRAFO, continue ROM, splinting, slow motor return LUE Continue CIR, patient and family education 2.  Antithrombotics: -DVT/anticoagulation: Lower extremity Dopplers negative Pharmaceutical: Lovenox  -he will not go home on lovenox             -antiplatelet therapy: Aspirin 81 mg daily and Plavix 75 mg daily x90 days then aspirin alone 3. Pain Management: Tylenol as needed  - kpad for back  Now with left ankle pain-encouraged ice  Robaxin 500 3 times daily as needed initiated  Consider aircast if persistent for ?left ankle sprain 4. Mood/sleep-wake: Provide emotional support  -continue scheduled seroquel at HS for sleep and irritability--adjusted to 100mg  and scheduled at 2000 to help him fall asleep earlier   -melatonin also   Sleep improving             -antipsychotic agents: seroquel   -added nicotine patch for cigarette craving  -amantadine for day time arousal  5. Neuropsych: This patient is capable of making decisions on his own behalf. 6. Skin/Wound Care: Routine skin checks. Encourage appropriate nutrition 7. Fluids/Electrolytes/Nutrition: eating well 8.  Polysubstance abuse.  Provide counseling 9.  Hyperlipidemia: Lipitor 10.  Obesity.  BMI 34.97.  Dietary follow-up 11. Post-stroke dysphagia:  advanced to regular diet, tolerating 12. Hypoalbuminemia  Eating well 13.  Hyperglycemia  Likely stress-induced, continue to  monitor  10/17- BG's 108 on last labs- follow up as outpt  LOS: 27 days A FACE TO FACE EVALUATION WAS PERFORMED  David Craig 03/21/2021, 11:49 AM

## 2021-03-22 DIAGNOSIS — S93402D Sprain of unspecified ligament of left ankle, subsequent encounter: Secondary | ICD-10-CM

## 2021-03-22 MED ORDER — METHOCARBAMOL 500 MG PO TABS: 500.0000 mg | ORAL_TABLET | Freq: Three times a day (TID) | ORAL | 0 refills | Status: DC | PRN

## 2021-03-22 NOTE — Progress Notes (Signed)
PROGRESS NOTE   Subjective/Complaints: Left ankle pain seems better, mild swelling still.   ROS: Patient denies fever, rash, sore throat, blurred vision, nausea, vomiting, diarrhea, cough, shortness of breath or chest pain, joint or back pain, headache, or mood change.   Objective:   No results found. No results for input(s): WBC, HGB, HCT, PLT in the last 72 hours.  No results for input(s): NA, K, CL, CO2, GLUCOSE, BUN, CREATININE, CALCIUM in the last 72 hours.   Intake/Output Summary (Last 24 hours) at 03/22/2021 0919 Last data filed at 03/21/2021 2000 Gross per 24 hour  Intake --  Output 750 ml  Net -750 ml         Physical Exam: Vital Signs Blood pressure 110/79, pulse 64, temperature 98.4 F (36.9 C), resp. rate 18, height 5\' 8"  (1.727 m), weight 98.7 kg, SpO2 96 %. Constitutional: No distress . Vital signs reviewed. HEENT: NCAT, EOMI, oral membranes moist Neck: supple Cardiovascular: RRR without murmur. No JVD    Respiratory/Chest: CTA Bilaterally without wheezes or rales. Normal effort    GI/Abdomen: BS +, non-tender, non-distended Ext: no clubbing, cyanosis, or edema Psych: pleasant and cooperative  Musc: Left ankle with mild edema and tenderness along lateral malleolus Neuro: alert and oriented to person, place, month Dysarthria, unchanged Left facial weakness persistent Motor: LUE: Shoulder abduction and pecs 1+/5, distally 0/5, unchanged Increased flexor tone noted in wrist LLE: Hip flexion, knee extension 3/5, distally 0/5  Assessment/Plan: 1. Functional deficits which require 3+ hours per day of interdisciplinary therapy in a comprehensive inpatient rehab setting. Physiatrist is providing close team supervision and 24 hour management of active medical problems listed below. Physiatrist and rehab team continue to assess barriers to discharge/monitor patient progress toward functional and medical  goals  Care Tool:  Bathing    Body parts bathed by patient: Left arm, Chest, Abdomen, Front perineal area, Right upper leg, Left upper leg, Face, Right lower leg, Buttocks   Body parts bathed by helper: Left lower leg, Right arm     Bathing assist Assist Level: Minimal Assistance - Patient > 75%     Upper Body Dressing/Undressing Upper body dressing   What is the patient wearing?: Pull over shirt    Upper body assist Assist Level: Supervision/Verbal cueing    Lower Body Dressing/Undressing Lower body dressing      What is the patient wearing?: Underwear/pull up, Pants     Lower body assist Assist for lower body dressing: Moderate Assistance - Patient 50 - 74%     Toileting Toileting    Toileting assist Assist for toileting: Minimal Assistance - Patient > 75% Assistive Device Comment: urinal   Transfers Chair/bed transfer  Transfers assist     Chair/bed transfer assist level: Supervision/Verbal cueing     Locomotion Ambulation   Ambulation assist      Assist level: Minimal Assistance - Patient > 75% Assistive device: Walker-rolling Max distance: 150'   Walk 10 feet activity   Assist     Assist level: Minimal Assistance - Patient > 75% Assistive device: Walker-rolling   Walk 50 feet activity   Assist Walk 50 feet with 2 turns activity did not occur:  Safety/medical concerns  Assist level: Minimal Assistance - Patient > 75% Assistive device: Walker-rolling    Walk 150 feet activity   Assist Walk 150 feet activity did not occur: Safety/medical concerns  Assist level: Minimal Assistance - Patient > 75% Assistive device: Walker-rolling    Walk 10 feet on uneven surface  activity   Assist Walk 10 feet on uneven surfaces activity did not occur: Safety/medical concerns   Assist level: Contact Guard/Touching assist Assistive device: Walker-rolling   Wheelchair     Assist Is the patient using a wheelchair?: Yes Type of  Wheelchair: Manual    Wheelchair assist level: Supervision/Verbal cueing Max wheelchair distance: 150'    Wheelchair 50 feet with 2 turns activity    Assist        Assist Level: Supervision/Verbal cueing   Wheelchair 150 feet activity     Assist      Assist Level: Supervision/Verbal cueing   Blood pressure 110/79, pulse 64, temperature 98.4 F (36.9 C), resp. rate 18, height 5\' 8"  (1.727 m), weight 98.7 kg, SpO2 96 %.  Medical Problem List and Plan: 1.  Left-sided hemiparesis secondary to right PCA infarction secondary to terminal right ICA occlusion status post failed mechanical thrombectomy etiology likely secondary to cocaine use/vasospasm WHO/PRAFO, continue ROM, splinting, slow motor return LUE Dc home today F/u with [facility], neuro 2.  Antithrombotics:               -antiplatelet therapy: Aspirin 81 mg daily and Plavix 75 mg daily x90 days then aspirin alone 3. Pain Management:    - kpad for back  Now with left ankle pain-mild sprain  Robaxin 500 3 times daily as needed    ROM, use PRAFO at night  -may use an ocasional OTC ibuprofen at home, tylenol first choice 4. Mood/sleep-wake: Provide emotional support  -continue scheduled seroquel at HS for sleep and irritability--adjusted to 100mg  and scheduled at 2000 to help him fall asleep earlier   -melatonin also    -amantadine for day time arousal    -continue regimen as outpt   8.  Polysubstance abuse.  Provided counseling 9.  Hyperlipidemia: Lipitor 10.  Obesity.  BMI 34.97.  Dietary follow-up 11. Post-stroke dysphagia:  on regular diet, tolerating 13.  Hyperglycemia  Likely stress-induced, continue to monitor  10/17- BG's 108 on last labs- follow up as outpt  LOS: 28 days A FACE TO FACE EVALUATION WAS PERFORMED  03/22/2021, 9:19 AM

## 2021-03-22 NOTE — Progress Notes (Signed)
Inpatient Rehabilitation Medication Discharge Review by a Pharmacist  A complete drug regimen review was completed for this patient to identify any potential clinically significant medication issues.  High Risk Drug Classes Is patient taking? Indication by Medication  Antipsychotic Yes Seroquel  Anticoagulant Yes  Lovenox for VTE ppx. Dc at discharge  Antibiotic No   Opioid No   Antiplatelet Yes Aspirin and Plavix for CVA  Hypoglycemics/insulin No   Vasoactive Medication No   Chemotherapy No   Other Yes Lipitor, protonix, amantadine     Type of Medication Issue Identified Description of Issue Recommendation(s)  Drug Interaction(s) (clinically significant)     Duplicate Therapy     Allergy     No Medication Administration End Date     Incorrect Dose     Additional Drug Therapy Needed     Significant med changes from prior encounter (inform family/care partners about these prior to discharge).    Other       Clinically significant medication issues were identified that warrant physician communication and completion of prescribed/recommended actions by midnight of the next day:  No  Pharmacist comments: None  Time spent performing this drug regimen review (minutes):  20 minutes  Ulyses Southward, PharmD, [place], AAHIVP, CPP Infectious Disease Pharmacist 03/22/2021 10:03 AM

## 2021-03-28 ENCOUNTER — Other Ambulatory Visit: Payer: Self-pay

## 2021-03-28 ENCOUNTER — Ambulatory Visit: Payer: BC Managed Care – PPO | Attending: Physical Medicine and Rehabilitation | Admitting: Occupational Therapy

## 2021-03-28 ENCOUNTER — Encounter: Payer: Self-pay | Admitting: Occupational Therapy

## 2021-03-28 DIAGNOSIS — R41840 Attention and concentration deficit: Secondary | ICD-10-CM | POA: Insufficient documentation

## 2021-03-28 DIAGNOSIS — R2681 Unsteadiness on feet: Secondary | ICD-10-CM | POA: Diagnosis not present

## 2021-03-28 DIAGNOSIS — R278 Other lack of coordination: Secondary | ICD-10-CM | POA: Insufficient documentation

## 2021-03-28 DIAGNOSIS — R41841 Cognitive communication deficit: Secondary | ICD-10-CM | POA: Insufficient documentation

## 2021-03-28 DIAGNOSIS — M25512 Pain in left shoulder: Secondary | ICD-10-CM | POA: Insufficient documentation

## 2021-03-28 DIAGNOSIS — I69854 Hemiplegia and hemiparesis following other cerebrovascular disease affecting left non-dominant side: Secondary | ICD-10-CM | POA: Insufficient documentation

## 2021-03-28 DIAGNOSIS — M6281 Muscle weakness (generalized): Secondary | ICD-10-CM | POA: Diagnosis not present

## 2021-03-28 DIAGNOSIS — R471 Dysarthria and anarthria: Secondary | ICD-10-CM | POA: Diagnosis not present

## 2021-03-28 DIAGNOSIS — R41842 Visuospatial deficit: Secondary | ICD-10-CM | POA: Diagnosis not present

## 2021-03-28 NOTE — Therapy (Signed)
[facility] [address] [place], [place], [address] Phone: [phone]   Fax:  [phone]  Occupational Therapy Evaluation  Patient Details  Name: David Craig MRN: [id number] Date of Birth: [birth year] Referring Provider (OT): Marissa Nestle   Encounter Date: 03/28/2021   OT End of Session - 03/28/21 1137     Visit Number 1    Number of Visits 15    Date for OT Re-Evaluation 05/30/21   14 visits over 9 weeks d/t any scheduling conflicts   Authorization Type BCBS    Authorization Time Period VL: 30 (PT,OT)    Authorization - Number of Visits 15    OT Start Time 1145    OT Stop Time 1228    OT Time Calculation (min) 43 min    Activity Tolerance Patient tolerated treatment well    Behavior During Therapy [facility] for tasks assessed/performed             History reviewed. No pertinent past medical history.  Past Surgical History:  Procedure Laterality Date   BUBBLE STUDY  02/21/2021   Procedure: BUBBLE STUDY;  Surgeon: Chilton Si, MD;  Location: [facility] ENDOSCOPY;  Service: Cardiovascular;;   IR ANGIO INTRA EXTRACRAN SEL COM CAROTID INNOMINATE UNI L MOD SED  02/15/2021   IR ANGIO VERTEBRAL SEL VERTEBRAL BILAT MOD SED  02/15/2021   IR CT HEAD LTD  02/15/2021   IR PERCUTANEOUS ART THROMBECTOMY/INFUSION INTRACRANIAL INC DIAG ANGIO  02/15/2021   IR US GUIDE VASC ACCESS RIGHT  02/15/2021   RADIOLOGY WITH ANESTHESIA N/A 02/14/2021   Procedure: IR WITH ANESTHESIA;  Surgeon: Julieanne Cotton, MD;  Location: [facility] OR;  Service: Radiology;  Laterality: N/A;   TEE WITHOUT CARDIOVERSION N/A 02/21/2021   Procedure: TRANSESOPHAGEAL ECHOCARDIOGRAM (TEE);  Surgeon: Chilton Si, MD;  Location: [facility] ENDOSCOPY;  Service: Cardiovascular;  Laterality: N/A;    There were no vitals filed for this visit.   Subjective Assessment - 03/28/21 1252     Subjective  Pt is a 35 year old male that presents to Neuro OPOT s/p CVA on 02/14/21 with  admission to ED with left-sided weakness of acute onset as well as headache. Pt with PMH significant for polysubstance abuse, migraines, and obesity. Pt was independent prior to CVA and lives with spouse and 2 young children (1 and 2). Pt reports blurriness in L peripheral field and deficits with walking and moving L arm. Pt reporst goal to "get back to normal self" and to "be able to walk to bathroom and pee and pick up my kids".    Patient is accompanied by: Family member   spouse   Pertinent History PMH: polysubstance abuse/tobacco, migrane headaches and obesity    Limitations Fall Risk, Impulsive, L hemi    Patient Stated Goals "get back to normal self" and to "be able to walk to bathroom and pee and pick up my kids".    Currently in Pain? No/denies    Pain Score 0-No pain   8/10 pain in LUE shoulder during movemen.              [facility] OT Assessment - 03/28/21 1138       Assessment   Medical Diagnosis CVA of R PCA    Referring Provider (OT) Pam Love    Onset Date/Surgical Date 02/14/21    Hand Dominance Right    Prior Therapy Inpatient Rehab      Precautions   Precautions Fall    Precaution Comments R hemi,  R visual field cut    Required Braces or Orthoses Other Brace/Splint    Other Brace/Splint AFO and resting hand splint @ NOC      Balance Screen   Has the patient fallen in the past 6 months Yes    How many times? 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    Leisure fishing,  hanging out with kids      ADL   Eating/Feeding Needs assist with cutting food    Grooming Set up    Upper Body Bathing Moderate assistance    Lower Body Bathing Moderate assistance    Upper Body Dressing Increased time   can do it but it's "slow"   Lower Body Dressing Maximal assistance    Toilet Transfer Supervision/safety    Toileting - Clothing Manipulation Minimal assistance    Toileting -  Hygiene Increase time    Tub/Shower Transfer Supervision/safety      IADL   Prior Level of Function Shopping was not completing before    Prior Level of Function Light Housekeeping not completing prior    Prior Level of Function Meal Prep occassionally    Meal Prep Able to complete simple cold meal and snack prep;Does not utilize stove or oven    Prior Level of Tourist information centre manager Relies on family or friends for transportation    Medication Management Is not capable of dispensing or managing own medication    Prior Level of Function Financial Management spouse handled before    Financial Management Dependent      Mobility   Mobility Status Comments arrived in manual w/c      Written Expression   Dominant Hand Right      Vision - History   Baseline Vision No visual deficits    Additional Comments pt reports blurry on the left      Vision Assessment   Visual Fields Left visual field deficit      Cognition   Overall Cognitive Status Impaired/Different from baseline      Sensation   Light Touch Appears Intact    Hot/Cold Appears Intact    Proprioception Appears Intact      Coordination   Coordination LUE with volitional movements but unable to grasp/release. RUE WFL      ROM / Strength   AROM / PROM / Strength AROM;PROM      AROM   Overall AROM  Deficits;Within functional limits for tasks performed    Overall AROM Comments RUE WFL, LUE Deficits    AROM Assessment Site Shoulder;Elbow;Forearm;Wrist                                 OT Short Term Goals - 03/28/21 1448       OT SHORT TERM GOAL #1   Title Pt will  be independent with initial HEP    Time 4    Period Weeks    Status New    Target Date 04/25/21      OT SHORT TERM GOAL #2   Title Pt will report pain no greater than 7/10 with HEP    Baseline 8/10 with shoulder flexion/abduction    Time 4    Period Weeks    Status New      OT SHORT TERM GOAL #3   Title Pt will verbalize and demonstrate understanding of visual scanning startegies for visual field cut.    Baseline left visual field cut    Time 4    Period Weeks    Status New      OT SHORT TERM GOAL #4   Title Pt will perform environmental scanning with 90% accuracy or greater    Time 4    Period Weeks    Status New               OT Long Term Goals - 03/28/21 1505       OT LONG TERM GOAL #1   Title Pt will be independent with updated HEP    Time 9    Period Weeks    Status New    Target Date 05/30/21      OT LONG TERM GOAL #2   Title Pt will perform table top scanning with 95% accuracy or greater with use of visual scanning strategies.    Baseline 89% accuracy with 88's    Time 9    Period Weeks    Status New      OT LONG TERM GOAL #3   Title Pt will demonstrate ability to grasp and release cylindrical object and/or 1 inch block with LUE.    Baseline trace volitional composite flexion    Time 9    Period Weeks    Status New      OT LONG TERM GOAL #4   Title Pt will demonstrate active LUE movement to reach to low level surface for progression to reaching and obtaining objects    Baseline no active flexion    Time 9    Period Weeks    Status New      OT LONG TERM GOAL #5   Title PT will demonsrate using LUE as active and functional stabilizer for 20% or greater of ADLs and IADLs.    Baseline 0%    Time 9    Period Weeks    Status New                   Plan - 03/28/21 1222     Clinical Impression Statement Pt is a  35 year old male that presents to Neuro OPOT s/p CVA on 02/14/21. Pt presents with LUE hemiparesis with weakness, deficits in sensation, coordination and functional use. Pt also presents with left visual field cut and deficits with alternating attention and safety awareness. Skilled occupational therapy is recommended to target listed areas of deficit and increase independence with ADLs and IADLs and decrease caregiver burden.    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;Decreased knowledge of use of DME;Vision;Sensation;Flexibility;Coordination;IADL;ROM;UE functional use;Tone;Pain;Strength;GMC;Dexterity    Cognitive Skills Attention;Safety Awareness    Rehab Potential Good    Clinical Decision  Making Limited treatment options, no task modification necessary    Comorbidities Affecting Occupational Performance: None    Modification or Assistance to Complete Evaluation  No modification of tasks or assist necessary to complete eval    OT Frequency 2x / week    OT Duration Other (comment)   15 visits over 9 weeks d/t any scheduling conflicts   OT Treatment/Interventions Self-care/ADL training;Aquatic Therapy;Electrical Stimulation;Energy conservation;Manual Therapy;Patient/family education;Visual/perceptual remediation/compensation;Passive range of motion;Neuromuscular education;Building services engineer;Therapeutic exercise;Cognitive remediation/compensation;Moist Heat;Fluidtherapy;DME and/or AE instruction;Splinting;Therapeutic activities;Ultrasound    Plan visual strategies, HEP for self PROM for LUE, Neuroreed of LUE    Consulted and Agree with Plan of Care Patient;Family member/caregiver    Family Member Consulted spouse             Patient will benefit from  skilled therapeutic intervention in order to improve the following deficits and impairments:   Body Structure / Function / Physical Skills: ADL, Decreased knowledge of use of DME, Vision, Sensation, Flexibility, Coordination, IADL, ROM, UE functional use, Tone, Pain, Strength, GMC, Dexterity Cognitive Skills: Attention, Safety Awareness     Visit Diagnosis: Hemiplegia and hemiparesis following other cerebrovascular disease affecting left non-dominant side (HCC)  Muscle weakness (generalized)  Other lack of coordination  Unsteadiness on feet  Acute pain of left shoulder  Attention and concentration deficit  Visuospatial deficit    Problem List Patient Active Problem List   Diagnosis Date Noted   Sprain of left ankle    Sleep disturbance    Hyperglycemia    Hypoalbuminemia due to protein-calorie malnutrition (HCC)    Dyslipidemia    Hemiparesis affecting left side as late effect of stroke (HCC)    Lethargy    Cerebrovascular accident (CVA) due to occlusion of right posterior cerebral artery (HCC) 02/22/2021   Cerebrovascular accident (CVA) (HCC)    Right carotid artery occlusion 02/14/2021    Junious Dresser, OT/L 03/28/2021, 3:13 PM  [facility] [address] [place], [place], [address] Phone: [phone]   Fax:  [phone]  Name: David Craig MRN: [id number] Date of Birth: 11-22-1985

## 2021-03-30 ENCOUNTER — Ambulatory Visit: Payer: BC Managed Care – PPO

## 2021-03-30 ENCOUNTER — Other Ambulatory Visit: Payer: Self-pay

## 2021-03-30 DIAGNOSIS — R471 Dysarthria and anarthria: Secondary | ICD-10-CM | POA: Diagnosis not present

## 2021-03-30 DIAGNOSIS — R2681 Unsteadiness on feet: Secondary | ICD-10-CM | POA: Diagnosis not present

## 2021-03-30 DIAGNOSIS — M25512 Pain in left shoulder: Secondary | ICD-10-CM | POA: Diagnosis not present

## 2021-03-30 DIAGNOSIS — R41842 Visuospatial deficit: Secondary | ICD-10-CM | POA: Diagnosis not present

## 2021-03-30 DIAGNOSIS — R41841 Cognitive communication deficit: Secondary | ICD-10-CM

## 2021-03-30 DIAGNOSIS — I69854 Hemiplegia and hemiparesis following other cerebrovascular disease affecting left non-dominant side: Secondary | ICD-10-CM | POA: Diagnosis not present

## 2021-03-30 DIAGNOSIS — M6281 Muscle weakness (generalized): Secondary | ICD-10-CM | POA: Diagnosis not present

## 2021-03-30 DIAGNOSIS — R41840 Attention and concentration deficit: Secondary | ICD-10-CM | POA: Diagnosis not present

## 2021-03-30 DIAGNOSIS — R278 Other lack of coordination: Secondary | ICD-10-CM | POA: Diagnosis not present

## 2021-03-30 NOTE — Therapy (Signed)
[facility] [address] [place], [place], [address] Phone: [phone]   Fax:  [phone]  Speech Language Pathology Evaluation  Patient Details  Name: David Craig MRN: [id number] Date of Birth: 08/17/1985 Referring Provider (SLP): Jacquelynn Cree, [place]   Encounter Date: 03/30/2021   End of Session - 03/30/21 1851     Visit Number 1    Number of Visits 17    Date for SLP Re-Evaluation 05/25/21    Authorization Type BCBS    SLP Start Time 1101    SLP Stop Time  1147    SLP Time Calculation (min) 46 min    Activity Tolerance Patient tolerated treatment well             History reviewed. No pertinent past medical history.  Past Surgical History:  Procedure Laterality Date   BUBBLE STUDY  02/21/2021   Procedure: BUBBLE STUDY;  Surgeon: Chilton Si, MD;  Location: [facility] ENDOSCOPY;  Service: Cardiovascular;;   IR ANGIO INTRA EXTRACRAN SEL COM CAROTID INNOMINATE UNI L MOD SED  02/15/2021   IR ANGIO VERTEBRAL SEL VERTEBRAL BILAT MOD SED  02/15/2021   IR CT HEAD LTD  02/15/2021   IR PERCUTANEOUS ART THROMBECTOMY/INFUSION INTRACRANIAL INC DIAG ANGIO  02/15/2021   IR US GUIDE VASC ACCESS RIGHT  02/15/2021   RADIOLOGY WITH ANESTHESIA N/A 02/14/2021   Procedure: IR WITH ANESTHESIA;  Surgeon: Julieanne Cotton, MD;  Location: [facility] OR;  Service: Radiology;  Laterality: N/A;   TEE WITHOUT CARDIOVERSION N/A 02/21/2021   Procedure: TRANSESOPHAGEAL ECHOCARDIOGRAM (TEE);  Surgeon: Chilton Si, MD;  Location: [facility] ENDOSCOPY;  Service: Cardiovascular;  Laterality: N/A;    There were no vitals filed for this visit.       SLP Evaluation [facility] - 03/30/21 1104       SLP Visit Information   SLP Received On 03/20/21    Referring Provider (SLP) Jacquelynn Cree, PA-C    Onset Date 02-14-21    Medical Diagnosis Cerebrovascular accident (CVA) due to occlusion of right posterior cerebral      Subjective   Patient/Family Stated  Goal "to get back to normal"      Pain Assessment   Currently in Pain? No/denies      General Information   HPI Pt is a 35 year old male s/p CVA on 02/14/21 with admission to ED with left-sided weakness of acute onset as well as headache. Pt with PMH significant for polysubstance abuse, migraines, and obesity. Pt was independent prior to CVA and lives with spouse and 2 young children.      Balance Screen   Has the patient fallen in the past 6 months Yes    How many times? just at time of stroke      Prior Functional Status   Cognitive/Linguistic Baseline Within functional limits    Type of Home House     Lives With Spouse;Family    Available Support Family;Friend(s)    Education some college    Vocation Other (Comment)   sales HVAC/appliances     Cognition   Overall Cognitive Status Impaired/Different from baseline    Area of Impairment Memory;Attention;Problem solving    Current Attention Level Sustained    Memory Decreased short-term memory    Problem Solving Slow processing;Difficulty sequencing      Auditory Comprehension   Overall Auditory Comprehension Appears within functional limits for tasks assessed      Reading Comprehension   Reading Status Within funtional limits  Expression   Primary Mode of Expression Verbal      Verbal Expression   Overall Verbal Expression Appears within functional limits for tasks assessed      Written Expression   Dominant Hand Right      Oral Motor/Sensory Function   Overall Oral Motor/Sensory Function Appears within functional limits for tasks assessed      Motor Speech   Overall Motor Speech Impaired    Respiration Within functional limits    Phonation Normal    Resonance Within functional limits    Articulation Impaired    Level of Impairment Conversation    Intelligibility Intelligibility reduced    Word 75-100% accurate    Phrase 75-100% accurate    Sentence 75-100% accurate    Conversation 75-100% accurate    Motor  Planning Witnin functional limits    Effective Techniques Over-articulate;Slow rate    Phonation WFL      Standardized Assessments   Standardized Assessments  Montreal Cognitive Assessment [facility])   21/30                            SLP Education - 03/30/21 1850     Education Details eval results, memory/attention strategies, dysarthria HEP    Person(s) Educated Patient    Methods Explanation;Demonstration;Handout    Comprehension Verbalized understanding;Returned demonstration;Need further instruction              SLP Short Term Goals - 03/30/21 1854       SLP SHORT TERM GOAL #1   Title Pt will complete HEP with rare min A over 3 sessions    Time 4    Period Weeks    Status New    Target Date --      SLP SHORT TERM GOAL #2   Title Pt will comprehend and sort medications in role play medication management tasks with 90% accuracy    Time 4    Period Weeks    Status New      SLP SHORT TERM GOAL #3   Title Pt will comprehend and complete finanical management tasks with 80% accuracy over 2 sesssions    Time 4    Period Weeks    Status New      SLP SHORT TERM GOAL #4   Title Pt will demonstrate dysarthria compensations to be 100% intelligble in 10 minute conversations over 2 sessions    Time 4    Period Weeks    Status New      SLP SHORT TERM GOAL #5   Title Pt will complete cognitive communication PROM in first few sessions    Time 4    Period Weeks    Status New              SLP Long Term Goals - 03/30/21 1856       SLP LONG TERM GOAL #1   Title Pt will verbalize and demonstrate memory/attention compensations to aid daily functioning given rare min A over 2 sessions    Time 8    Period Weeks    Status New      SLP LONG TERM GOAL #2   Title Pt will independently manage medications with rare min A from wife over 4 sessions    Time 8    Period Weeks    Status New      SLP LONG TERM GOAL #3   Title Pt will demonstrate dysarthria  compensations to  be 100% intelligble in 20+ minute conversations over 2 sessions    Time 8    Period Weeks    Status New      SLP LONG TERM GOAL #4   Title Pt will report improved cognition and communication via PROM by 2 points at last ST session    Time 8    Period Weeks    Status New              Plan - 03/30/21 1152     Clinical Impression Statement "Lathan" was referred for OPST evaluation secondary to CVA in September 2022. Pt arrived alone this session. Improvements reported for cognition and speech, but pt reported he has yet to return to baseline. Pt endorsed intermittent "slurred speech" and more reliance on wife to complete cognitive tasks, including managing appointments, medications, and finances. Pt recently completed and recalled CLQT from [facility]; therefore, SLP assessed cognition via MOCA due to validity and reliability. Pt scored 21/30, with mild deficits exhibited related to recall, attention, and problem solving. SLP assessed speech intelligiblity in conversation and reading passage, with occasional fast rate and reduced articulatory precision noted. SLP educated and instructed attention/memory compensations as well as dysarthria compensations (slow rate and over-articulation) this session. Pt denied any swallowing difficulty at this time. Pt desires to return to baseline. Pt would benefit from skilled ST targeting cognitive linguistic skills and dysarthria to maximize return to PLOF.    Speech Therapy Frequency 2x / week    Duration 8 weeks    Treatment/Interventions Compensatory strategies;Cueing hierarchy;Functional tasks;Cognitive reorganization;Multimodal communcation approach;Language facilitation;Compensatory techniques;Internal/external aids;SLP instruction and feedback    Potential to Achieve Goals Good    SLP Home Exercise Plan provided    Consulted and Agree with Plan of Care Patient             Patient will benefit from skilled therapeutic intervention in  order to improve the following deficits and impairments:   Cognitive communication deficit  Dysarthria and anarthria    Problem List Patient Active Problem List   Diagnosis Date Noted   Sprain of left ankle    Sleep disturbance    Hyperglycemia    Hypoalbuminemia due to protein-calorie malnutrition (HCC)    Dyslipidemia    Hemiparesis affecting left side as late effect of stroke (HCC)    Lethargy    Cerebrovascular accident (CVA) due to occlusion of right posterior cerebral artery (HCC) 02/22/2021   Cerebrovascular accident (CVA) (HCC)    Right carotid artery occlusion 02/14/2021    Janann Colonel, MA CCC-SLP 03/30/2021, 7:05 PM  [facility] [address] [place], [place], [address] Phone: [phone]   Fax:  [phone]  Name: David Craig MRN: [id number] Date of Birth: 06-17-1985

## 2021-03-30 NOTE — Patient Instructions (Signed)
Memory Compensation Strategies  Use "WARM" strategy. W= write it down A=  associate it R=  repeat it M=  make a mental picture  You can keep a Memory Notebook. Use a 3-ring notebook with sections for the following:  calendar, important names and phone numbers, medications, doctors' names/phone numbers, "to do list"/reminders, and a section to journal what you did each day  Use a calendar to write appointments down.  Write yourself a schedule for the day.  This can be placed on the calendar or in a separate section of the Memory Notebook.  Keeping a regular schedule can help memory.  Use medication organizer with sections for each day or morning/evening pills  You may need help loading it  Keep a basket, or pegboard by the door.   Place items that you need to take out with you in the basket or on the pegboard.  You may also want to include a message board for reminders.  Use sticky notes. Place sticky notes with reminders in a place where the task is performed.  For example:  "turn off the stove" placed by the stove, "lock the door" placed on the door at eye level, "take your medications" on the bathroom mirror or by the place where you normally take your medications  Use alarms, timers, and/or a reminder app. Use while cooking to remind yourself to check on food or as a reminder to take your medicine, or as a reminder to make a call, or as a reminder to perform another task, etc.  Use a voice recorder app or small tape recorder to record important information and notes for yourself. Go back at the end of the day and listen to these.   Strategies for Improving Your Attention and Memory  Use good eye-contact Give the speaker your undivided attention Look directly at the speaker  Complete one task at a time Avoid multitasking Complete one task before starting a new one Write a note to yourself if you think of something else that needs to be done Let others know when you need  quiet time and can't be interrupted Don't answer the phone, texts, or emails while you are working on another task  Put aside distracting thoughts If you find your mind wandering, refocus your attention on the speaker Avoid off-topic comments or responses that may divert your attention  If something important comes to mind, let the speaker know and pause to write yourself a note: "Do you mind holding on a minute, I have to write something down." Put thoughts on hold and focus on salient information  Limit distractions in your environment Think about the environment around you Limit background noise by turning off the TV or music, putting your phone away Close the door and work in quiet  Use active listening Actively participate in the conversation to stay focused Paraphrase what you have heard to include the most important details Adding some associations may help you remember Ask questions to clarify certain points Summarize the speaker's comments periodically Avoid nodding your head and using "mhm" responses as these are more passive and don't help your attention  Alert the other person/people It may be helpful to alert your listener to the fact that you may need reminders to keep on track Tell the speaker in advance that you may need to stop them and have them repeat salient information If you lose focus, interject and let the person know, "I'm sorry, I lost you, can you tell me again?"  Write down information Write down pertinent information as it comes up, such as telephone numbers, names of people, addresses, details from appointments and conversations, etc. Speech Exercises  Do 2 times, 2 times a day  Call the cat "Buttercup" A calendar of [place] Four floors to cover Yellow oil ointment Fellow lovers of felines Catastrophe in [place] Plump plumbers' plums The church's chimes chimed Telling time 'til eleven Five valve levers Keep the gate closed Go see that  guy Fat cows give milk [employer] Gophers Fat frogs flip freely [employer] into bed Get that game to [employer] Thick thistles stick together Cinnamon aluminum linoleum Black bugs blood Lovely lemon linament Red leather, yellow leather  Big grocery buggy    Purple baby carriage [facility] Pro[place] copper coffee pot Ripe purple cabbage Three free throws [employer] tackled  [employer] dipped the dessert  [employer] Buckle that [employer] [place] Shirts shrink, shells shouldn't [place] [employer] Take the tackle box File the flash message Give me five flapjacks Fundamental relatives Dye the pets purple Talking [place] time after time Dark chocolate chunks Political landscape of the kingdom Actuary genius We played yo-yos yesterday

## 2021-04-03 ENCOUNTER — Encounter: Payer: BC Managed Care – PPO | Attending: Registered Nurse | Admitting: Registered Nurse

## 2021-04-03 ENCOUNTER — Encounter: Payer: Self-pay | Admitting: Registered Nurse

## 2021-04-03 ENCOUNTER — Other Ambulatory Visit: Payer: Self-pay

## 2021-04-03 VITALS — BP 146/74 | HR 76 | Temp 98.6°F | Ht 68.0 in | Wt 230.0 lb

## 2021-04-03 DIAGNOSIS — I63531 Cerebral infarction due to unspecified occlusion or stenosis of right posterior cerebral artery: Secondary | ICD-10-CM | POA: Insufficient documentation

## 2021-04-03 DIAGNOSIS — F1910 Other psychoactive substance abuse, uncomplicated: Secondary | ICD-10-CM | POA: Insufficient documentation

## 2021-04-03 DIAGNOSIS — I69354 Hemiplegia and hemiparesis following cerebral infarction affecting left non-dominant side: Secondary | ICD-10-CM | POA: Insufficient documentation

## 2021-04-03 NOTE — Progress Notes (Signed)
Subjective:    Patient ID: David Craig, male    DOB: 1986-05-20, 35 y.o.   MRN: [id number]  HPI: David Craig is a 35 y.o. male who is scheduled for HFU appointment for follow up of his Cerebrovascular accident due to occlusion of right posterior cerebral artery, Hemiparesis affecting left side as late effect of stroke and Polysubstance Abuse. He presented to [facility] on 02/14/2021 for Left- Sided Weakness. Neurology was consulted.  HPI: Dr Harriet Pho  HPI: David Craig is a 35 y.o. male with a past medical history significant for polysubstance abuse (cocaine, tobacco, vaping, oxycodone), migraine headaches, obesity (BMI 34.97).    He was having his typical migraine today, but this was not unusual for him and otherwise he had been in his normal state of health -- he had called out of work due to his headache. Last reported to cocaine use 1 week ago but fairly regular marijuana use at least several times a week.  He reported slipping in the shower due to generally slippery conditions, and initially reported to the ED provider that he had gotten back to his bed normally, but then later reported to me that he had significant difficulty getting back to bed due to left-sided weakness.  ED provider activated a code stroke on arrival appropriately given the left-sided deficits and right gaze preference.  His drug screen was positive for cocaine and marijuana.   CT Head: WO Contrast:  IMPRESSION: 1. Normal head CT. 2. ASPECTS is 10.  CT Angio:  IMPRESSION: 1. 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 The right middle and anterior cerebral arteries fill via flow across the anterior communicating artery. 2. Large area of ischemic penumbra within the right hemisphere in a watershed/hypoperfusion type distribution. No core infarct.  Dr Erlinda Hong: Discharge Summary Note:  02/22/2021 He was initially activated as a code IR for his high NIH stroke scale and favorable ASPECTS score and CT perfusion profile.  While his initial CTA was felt to be most consistent with a dissection his diagnostic cerebral angiogram did not clearly show a dissection, though there was interruption of flow in the right carotid artery after the takeoff of the ophthalmic artery through to the ICA terminus, with good flow across the circle of Willis to the right MCA.  One aspiration attempt was made without successful retrieval of the clot.  Given lack of clarity on the safety of further intervention (aggressive attempts at aspiration in the setting of a dissection would be much more risky than for a simple embolic clot), decision was made to attempt MRI/MRA to clarify dissection versus thrombus.  While not definitive, this imaging does favor likely thrombus.  Imaging also revealed that the patient has had a significant right PCA stroke which was felt to explain his deficits on examination; MRI confirmed that there did not seem to be significant right MCA territory cortical ischemia based on DWI imaging.    David Craig was admitted to inpatient rehabilitation on 02/22/2021 and discharged home on 03/22/2021. He denies any pain. He rates his pain 0. Also reports he has a good appetite. He will be attending outpatient therapy at [facility].   Wife in room.    Pain Inventory Average Pain 0 Pain Right Now 0 My pain is  NO PAIN  LOCATION OF PAIN  No control in left arm & left leg  BOWEL Number of stools per  week: 5   BLADDER Normal   Mobility use a walker how many minutes can you walk? 5 ability to climb steps?  yes do you drive?  no use a wheelchair transfers alone Do you have any goals in this area?  yes  Function employed # of hrs/week On short term disabiltiy I need assistance with the following:  dressing, bathing, toileting, meal prep, household duties, and  shopping Do you have any goals in this area?  yes  Neuro/Psych weakness trouble walking  Prior Studies Any changes since last visit?  no  Physicians involved in your care Any changes since last visit?  no   No family history on file. Social History   Socioeconomic History   Marital status: Single    Spouse name: Not on file   Number of children: 2   Years of education: Not on file   Highest education level: Not on file  Occupational History   Not on file  Tobacco Use   Smoking status: Some Days    Types: Cigarettes    Last attempt to quit: 03/15/2015    Years since quitting: 6.0   Smokeless tobacco: Never  Vaping Use   Vaping Use: Former  Substance and Sexual Activity   Alcohol use: Yes    Comment: occasional   Drug use: Yes    Types: Cocaine, Marijuana, Oxycodone   Sexual activity: Yes    Partners: Female  Other Topics Concern   Not on file  Social History Narrative   Not on file   Social Determinants of Health   Financial Resource Strain: Not on file  Food Insecurity: Not on file  Transportation Needs: Not on file  Physical Activity: Not on file  Stress: Not on file  Social Connections: Not on file   Past Surgical History:  Procedure Laterality Date   BUBBLE STUDY  02/21/2021   Procedure: BUBBLE STUDY;  Surgeon: Skeet Latch, MD;  Location: [place];  Service: Cardiovascular;;   IR ANGIO INTRA EXTRACRAN SEL COM CAROTID INNOMINATE UNI L MOD SED  02/15/2021   IR ANGIO VERTEBRAL SEL VERTEBRAL BILAT MOD SED  02/15/2021   IR CT HEAD LTD  02/15/2021   IR PERCUTANEOUS ART THROMBECTOMY/INFUSION INTRACRANIAL INC DIAG ANGIO  02/15/2021   IR US GUIDE VASC ACCESS RIGHT  02/15/2021   RADIOLOGY WITH ANESTHESIA N/A 02/14/2021   Procedure: IR WITH ANESTHESIA;  Surgeon: Luanne Bras, MD;  Location: [place];  Service: Radiology;  Laterality: N/A;   TEE WITHOUT CARDIOVERSION N/A 02/21/2021   Procedure: TRANSESOPHAGEAL ECHOCARDIOGRAM (TEE);  Surgeon: Skeet Latch, MD;  Location: [place];  Service: Cardiovascular;  Laterality: N/A;   History reviewed. No pertinent past medical history. BP (!) 146/74   Pulse 76   Temp 98.6 F (37 C)   Ht 5\' 8"  (1.727 m)   Wt 230 lb (104.3 kg) Comment: wheelchair  SpO2 95%   BMI 34.97 kg/m   Opioid Risk Score:   Fall Risk Score:  `1  Depression screen PHQ 2/9  No flowsheet data found.  Review of Systems  HENT:  Positive for drooling.   Eyes:  Positive for visual disturbance.  Musculoskeletal:  Positive for gait problem.       Left arm , left leg no control  All other systems reviewed and are negative.     Objective:   Physical Exam Vitals and nursing note reviewed.  Constitutional:      Appearance: Normal appearance.  Cardiovascular:     Rate and  Rhythm: Normal rate and regular rhythm.     Pulses: Normal pulses.     Heart sounds: Normal heart sounds.  Pulmonary:     Effort: Pulmonary effort is normal.     Breath sounds: Normal breath sounds.  Musculoskeletal:     Cervical back: Normal range of motion and neck supple.     Comments: Normal Muscle Bulk and Muscle Testing Reveals:  Upper Extremities: Right: Full ROM and Muscle Strength 5/5 Left Upper Extremity: Decreased ROM and Muscle Strength 1/5 Lower Extremities: Right: Full ROM and Muscle Strength 5/5 Left Lower Extremity: Decreased ROM and he is wearing AFO Arises from chair slowly using walker for support Wide Based  Gait     Skin:    General: Skin is warm and dry.  Neurological:     Mental Status: He is alert and oriented to person, place, and time.  Psychiatric:        Mood and Affect: Mood normal.        Behavior: Behavior normal.         Assessment & Plan:  Cerebrovascular accident due to occlusion of right posterior cerebral artery, Hemiparesis affecting left side as late effect of stroke: He is scheduled for outpatient therapy at [facility]. He has a scheduled appointment with neurology. Continue  current medication regimen. Continue to monitor.  2. Polysubstance Abuse: David Craig stated : He has not use any drugs since his discharge. Educated on cessation, he verbalizes understanding.   F/U with Dr Riley Kill in 4- 6 weeks.

## 2021-04-03 NOTE — Patient Instructions (Signed)
My- Chart Telephone number [phone]

## 2021-04-04 ENCOUNTER — Ambulatory Visit: Payer: BC Managed Care – PPO | Attending: Physical Medicine and Rehabilitation | Admitting: Physical Therapy

## 2021-04-04 DIAGNOSIS — R41840 Attention and concentration deficit: Secondary | ICD-10-CM | POA: Insufficient documentation

## 2021-04-04 DIAGNOSIS — R2689 Other abnormalities of gait and mobility: Secondary | ICD-10-CM | POA: Insufficient documentation

## 2021-04-04 DIAGNOSIS — R278 Other lack of coordination: Secondary | ICD-10-CM | POA: Insufficient documentation

## 2021-04-04 DIAGNOSIS — R2681 Unsteadiness on feet: Secondary | ICD-10-CM

## 2021-04-04 DIAGNOSIS — I63531 Cerebral infarction due to unspecified occlusion or stenosis of right posterior cerebral artery: Secondary | ICD-10-CM | POA: Diagnosis not present

## 2021-04-04 DIAGNOSIS — M6281 Muscle weakness (generalized): Secondary | ICD-10-CM | POA: Insufficient documentation

## 2021-04-04 DIAGNOSIS — I69854 Hemiplegia and hemiparesis following other cerebrovascular disease affecting left non-dominant side: Secondary | ICD-10-CM | POA: Diagnosis not present

## 2021-04-04 DIAGNOSIS — I69354 Hemiplegia and hemiparesis following cerebral infarction affecting left non-dominant side: Secondary | ICD-10-CM

## 2021-04-04 DIAGNOSIS — R41841 Cognitive communication deficit: Secondary | ICD-10-CM | POA: Insufficient documentation

## 2021-04-04 DIAGNOSIS — R471 Dysarthria and anarthria: Secondary | ICD-10-CM | POA: Insufficient documentation

## 2021-04-04 DIAGNOSIS — R29818 Other symptoms and signs involving the nervous system: Secondary | ICD-10-CM | POA: Insufficient documentation

## 2021-04-04 DIAGNOSIS — M25512 Pain in left shoulder: Secondary | ICD-10-CM | POA: Diagnosis not present

## 2021-04-05 ENCOUNTER — Encounter: Payer: Self-pay | Admitting: Physical Therapy

## 2021-04-05 NOTE — Therapy (Signed)
[facility] [address] [place], [place], [address] Phone: [phone]   Fax:  [phone]  Physical Therapy Evaluation  Patient Details  Name: David Craig MRN: [id number] Date of Birth: [birth year] Referring Provider (PT): Delle Reining, [place]   Encounter Date: 04/04/2021   PT End of Session - 04/05/21 1548     Visit Number 1    Number of Visits 15   2x/week x 7 weeks + eval = 15 visits   Authorization Type BCBS    Authorization - Visit Number 1    Authorization - Number of Visits 15    PT Start Time 1105    PT Stop Time 1150    PT Time Calculation (min) 45 min    Equipment Utilized During Treatment Gait belt    Activity Tolerance Patient tolerated treatment well    Behavior During Therapy Impulsive   Decreased safety awareness noted            History reviewed. No pertinent past medical history.  Past Surgical History:  Procedure Laterality Date   BUBBLE STUDY  02/21/2021   Procedure: BUBBLE STUDY;  Surgeon: Chilton Si, MD;  Location: [facility] ENDOSCOPY;  Service: Cardiovascular;;   IR ANGIO INTRA EXTRACRAN SEL COM CAROTID INNOMINATE UNI L MOD SED  02/15/2021   IR ANGIO VERTEBRAL SEL VERTEBRAL BILAT MOD SED  02/15/2021   IR CT HEAD LTD  02/15/2021   IR PERCUTANEOUS ART THROMBECTOMY/INFUSION INTRACRANIAL INC DIAG ANGIO  02/15/2021   IR US GUIDE VASC ACCESS RIGHT  02/15/2021   RADIOLOGY WITH ANESTHESIA N/A 02/14/2021   Procedure: IR WITH ANESTHESIA;  Surgeon: Julieanne Cotton, MD;  Location: [facility] OR;  Service: Radiology;  Laterality: N/A;   TEE WITHOUT CARDIOVERSION N/A 02/21/2021   Procedure: TRANSESOPHAGEAL ECHOCARDIOGRAM (TEE);  Surgeon: Chilton Si, MD;  Location: [facility] ENDOSCOPY;  Service: Cardiovascular;  Laterality: N/A;    There were no vitals filed for this visit.    Subjective Assessment - 04/04/21 1109     Subjective Pt presents to PT eval alone in manual wheelchair - used [facility] transportation to  come to clinic.  Pt has been evaluated by ST & OT (last week); Pt states he is walking with RW by himself in the house; pt states he has decreased Lt peripheral vision and blurred vision - has upcoming appt with Dr. Dione Booze per his report. Pt asks "so how long is it going to take me to get back to normal"?    Pertinent History Rt PCA CVA on 02-14-21; h/o polysubstance abuse (cocaine, marijuana, oxycodone), h/o migraines, h/o Lt ankle sprain    Diagnostic tests CT and MRI  -  revealed Rt PCA infarcts    Patient Stated Goals "Doing what I used to do" - take care of my kids (ages 64 1/2 and 1 1/2)    Currently in Pain? No/denies                [facility] PT Assessment - 04/05/21 0001       Assessment   Medical Diagnosis Rt PCA CVA    Referring Provider (PT) Delle Reining, PA-C    Onset Date/Surgical Date 02/14/21    Hand Dominance Right    Prior Therapy Inpatient Rehab      Precautions   Precautions Fall    Required Braces or Orthoses Other Brace/Splint   Lt AFO and night splint; has Lt resting hand splint; has hand orthosis for RW     Restrictions   Weight  Bearing Restrictions No      Balance Screen   Has the patient fallen in the past 6 months Yes    How many times? 1   fell when he had CVA   Has the patient had a decrease in activity level because of a fear of falling?  Yes    Is the patient reluctant to leave their home because of a fear of falling?  No      Home Environment   Living Environment Private residence    Type of Home House    Home Access Ramped entrance    Entrance Stairs-Number of Steps 5    Home Layout One level      Prior Function   Level of Independence Independent    Vocation Full time employment    Vocation Requirements worked in Airline pilot for [employer];Other (comment)   decr. safety awareness     Observation/Other Assessments   Focus on Therapeutic Outcomes (FOTO)  52.69 - stroke LE; risk adjusted 55/100      Tone    Assessment Location Left Lower Extremity      AROM   Overall AROM  Deficits    AROM Assessment Site Hip;Knee;Ankle      Strength   Overall Strength Deficits    Strength Assessment Site Hip;Knee;Ankle    Right/Left Hip Left    Left Hip Flexion 3-/5    Right/Left Knee Left    Left Knee Flexion 2+/5    Left Knee Extension 4-/5   able to fully extend after 2-3 reps due to extensor tone   Right/Left Ankle Left    Left Ankle Dorsiflexion 1/5    Left Ankle Plantar Flexion 2-/5      Ambulation/Gait   Ambulation/Gait Yes    Ambulation/Gait Assistance 4: Min assist    Ambulation/Gait Assistance Details pt wearing [place] AFO on LLE; hand orthosis used on Lt side of walker    Ambulation Distance (Feet) 130 Feet    Assistive device Rolling walker    Gait Pattern Decreased hip/knee flexion - left;Decreased stance time - left;Decreased weight shift to left;Decreased dorsiflexion - left;Decreased step length - left   Decr. Lt knee extension in stance   Ambulation Surface Level;Indoor      Balance   Balance Assessed Yes      Static Standing Balance   Static Standing - Balance Support No upper extremity supported   walker was in front of patient; able to stand for approx. 15 secs without UE support   Static Standing - Level of Assistance 5: Stand by assistance      LLE Tone   LLE Tone Moderate                        Objective measurements completed on examination: See above findings.                PT Education - 04/05/21 1544     Education Details emphasized safety awareness with importance of locking brakes on wheelchair for safety    Person(s) Educated Patient    Methods Explanation    Comprehension Verbalized understanding;Need further instruction;Verbal cues required              PT Short Term Goals - 04/05/21 1612       PT SHORT TERM GOAL #1   Title Independent in HEP for LLE strengthening and stretching.    Time 3  Period Weeks    Status  New    Target Date 04/27/21      PT SHORT TERM GOAL #2   Title Pt will participate in assessment of Berg balance test.    Time 3    Period Weeks    Status New    Target Date 04/27/21      PT SHORT TERM GOAL #3   Title Pt will amb. with RW 350' with SBA on flat, even surface.    Time 3    Period Weeks    Status New    Target Date 04/27/21      PT SHORT TERM GOAL #4   Title Pt will transfer wheelchair to mat independently without cues to lock brakes.    Baseline SBA but needed assistance with locking brakes    Time 3    Period Weeks    Status New    Target Date 04/27/21               PT Long Term Goals - 04/05/21 1615       PT LONG TERM GOAL #1   Title Pt will amb. 250' with large- based quad cane with CGA.    Time 7    Period Weeks    Status New    Target Date 05/25/21      PT LONG TERM GOAL #2   Title Pt will negotiate steps with 1 hand rail with SBA.    Time 7    Target Date 05/25/21      PT LONG TERM GOAL #3   Title Improve Berg balance score by at least 8 points to demo improvement in balance and to reduce fall risk.    Time 7    Period Weeks    Status New    Target Date 05/25/21      PT LONG TERM GOAL #4   Title Pt will stand for at least 5" without UE support and reach at least 10" outside BOS independently for increased independence with ADL's.    Time 7    Period Weeks    Status New    Target Date 05/25/21      PT LONG TERM GOAL #5   Title Negotiate ramp and curb with LBQC with CGA.    Time 7    Period Weeks    Status New    Target Date 05/25/21                    Plan - 04/05/21 1549     Clinical Impression Statement Pt is a 35 yr old male s/p Rt PCA CVA on 02-14-21 who presents with spastic Lt hemiparesis. Pt presented to [facility] ED on 02-14-21 with Lt sided weakness and HA.  CT scan revealed Rt PCA infarcts. Pt was transferred to CIR on 02-23-19 and discharged home on 03-22-21.    Pt is using manual wheelchair for mobility and is  currently wearing Lt AFO.  Pt able to stand unsupported for approx. 30 secs with SBA but requires assistance for dynamic standing balance.  Pt presents with decreased strength and functional use of LUE and LLE.  Pt demonstrates some impulsivity and decreased safety awareness, as he did not remember to lock brakes with wheelchair to mat transfer.  Pt has moderate extensor tone in LLE with difficulty performing full Lt knee extension on initial rep and has difficulty initiating Lt knee flexion due to extensor tone and hamstring weakness.  Pt will  benefit from PT to address gait and balance deficits, decreased LLE strength and functional use.    Personal Factors and Comorbidities Behavior Pattern;Comorbidity 2;Transportation;Profession    Comorbidities s/p R PCA CVA with Lt hemiparesis, polysubstance abuse, migraines, obesity, h/o Lt sprained ankle    Examination-Activity Limitations Bathing;Locomotion Level;Transfers;Bend;Caring for Others;Dressing;Lift;Stand;Stairs;Squat;Reach Overhead    Examination-Participation Restrictions Cleaning;Community Activity;Driving;Interpersonal Relationship;Laundry;Yard Work;Shop;Occupation;Meal Prep    Stability/Clinical Decision Making Evolving/Moderate complexity    Clinical Decision Making Moderate    Rehab Potential Good    PT Frequency 2x / week    PT Duration Other (comment)   7 weeks  (15 visit limit)   PT Treatment/Interventions ADLs/Self Care Home Management;DME Instruction;Gait training;Stair training;Therapeutic activities;Therapeutic exercise;Balance training;Neuromuscular re-education;Patient/family education;Passive range of motion;Visual/perceptual remediation/compensation    PT Next Visit Plan issue HEP for LLE strengthening and stretching (supine exs that pt can do independently - bridging, Lt hamstring stretch, Lt heel cord stretch if able to perform, Lt hip abdct. in hooklying, heel slides?, LAQ); gait train with RW ; I would like for Berg to be done at  some point in first 2 weeks ;    Recommended Other Services pt is receiving OT and ST    Consulted and Agree with Plan of Care Patient             Patient will benefit from skilled therapeutic intervention in order to improve the following deficits and impairments:  Abnormal gait, Decreased activity tolerance, Decreased balance, Decreased safety awareness, Decreased coordination, Decreased strength, Impaired tone, Impaired vision/preception, Impaired UE functional use  Visit Diagnosis: Hemiplegia and hemiparesis following cerebral infarction affecting left non-dominant side (HCC) - Plan: PT plan of care cert/re-cert  Other abnormalities of gait and mobility - Plan: PT plan of care cert/re-cert  Other symptoms and signs involving the nervous system - Plan: PT plan of care cert/re-cert  Unsteadiness on feet - Plan: PT plan of care cert/re-cert  Muscle weakness (generalized) - Plan: PT plan of care cert/re-cert     Problem List Patient Active Problem List   Diagnosis Date Noted   Sprain of left ankle    Sleep disturbance    Hyperglycemia    Hypoalbuminemia due to protein-calorie malnutrition (HCC)    Dyslipidemia    Hemiparesis affecting left side as late effect of stroke (HCC)    Lethargy    Cerebrovascular accident (CVA) due to occlusion of right posterior cerebral artery (HCC) 02/22/2021   Cerebrovascular accident (CVA) [facility])    Right carotid artery occlusion 02/14/2021    David Craig, Donavan Burnet, PT 04/05/2021, 4:26 PM  [facility] [address] [place], [place], [address] Phone: [phone]   Fax:  [phone]  Name: David Craig MRN: [id number] Date of Birth: 11/30/1985

## 2021-04-10 ENCOUNTER — Other Ambulatory Visit: Payer: Self-pay

## 2021-04-10 ENCOUNTER — Encounter: Payer: Self-pay | Admitting: Occupational Therapy

## 2021-04-10 ENCOUNTER — Ambulatory Visit: Payer: BC Managed Care – PPO | Admitting: Occupational Therapy

## 2021-04-10 ENCOUNTER — Ambulatory Visit: Payer: BC Managed Care – PPO

## 2021-04-10 DIAGNOSIS — R471 Dysarthria and anarthria: Secondary | ICD-10-CM | POA: Diagnosis not present

## 2021-04-10 DIAGNOSIS — I69354 Hemiplegia and hemiparesis following cerebral infarction affecting left non-dominant side: Secondary | ICD-10-CM

## 2021-04-10 DIAGNOSIS — I69854 Hemiplegia and hemiparesis following other cerebrovascular disease affecting left non-dominant side: Secondary | ICD-10-CM | POA: Diagnosis not present

## 2021-04-10 DIAGNOSIS — R278 Other lack of coordination: Secondary | ICD-10-CM | POA: Diagnosis not present

## 2021-04-10 DIAGNOSIS — M6281 Muscle weakness (generalized): Secondary | ICD-10-CM

## 2021-04-10 DIAGNOSIS — M25512 Pain in left shoulder: Secondary | ICD-10-CM | POA: Diagnosis not present

## 2021-04-10 DIAGNOSIS — R2681 Unsteadiness on feet: Secondary | ICD-10-CM

## 2021-04-10 DIAGNOSIS — R2689 Other abnormalities of gait and mobility: Secondary | ICD-10-CM

## 2021-04-10 DIAGNOSIS — R41840 Attention and concentration deficit: Secondary | ICD-10-CM

## 2021-04-10 DIAGNOSIS — I63531 Cerebral infarction due to unspecified occlusion or stenosis of right posterior cerebral artery: Secondary | ICD-10-CM | POA: Diagnosis not present

## 2021-04-10 DIAGNOSIS — R29818 Other symptoms and signs involving the nervous system: Secondary | ICD-10-CM | POA: Diagnosis not present

## 2021-04-10 DIAGNOSIS — R41841 Cognitive communication deficit: Secondary | ICD-10-CM | POA: Diagnosis not present

## 2021-04-10 NOTE — Therapy (Signed)
[facility] [address] [place], [place], [address] Phone: [phone]   Fax:  [phone]  Occupational Therapy Treatment  Patient Details  Name: David Craig MRN: [id number] Date of Birth: [birth year] Referring Provider (OT): Marissa Nestle   Encounter Date: 04/10/2021   OT End of Session - 04/10/21 1637     Visit Number 2    Number of Visits 15    Date for OT Re-Evaluation 05/30/21    Authorization Type BCBS    Authorization Time Period VL: 30 (PT,OT)    Authorization - Visit Number 1    Authorization - Number of Visits 15    OT Start Time 1530    OT Stop Time 1615    OT Time Calculation (min) 45 min    Activity Tolerance Patient tolerated treatment well    Behavior During Therapy [facility] for tasks assessed/performed             History reviewed. No pertinent past medical history.  Past Surgical History:  Procedure Laterality Date   BUBBLE STUDY  02/21/2021   Procedure: BUBBLE STUDY;  Surgeon: Chilton Si, MD;  Location: [facility] ENDOSCOPY;  Service: Cardiovascular;;   IR ANGIO INTRA EXTRACRAN SEL COM CAROTID INNOMINATE UNI L MOD SED  02/15/2021   IR ANGIO VERTEBRAL SEL VERTEBRAL BILAT MOD SED  02/15/2021   IR CT HEAD LTD  02/15/2021   IR PERCUTANEOUS ART THROMBECTOMY/INFUSION INTRACRANIAL INC DIAG ANGIO  02/15/2021   IR US GUIDE VASC ACCESS RIGHT  02/15/2021   RADIOLOGY WITH ANESTHESIA N/A 02/14/2021   Procedure: IR WITH ANESTHESIA;  Surgeon: Julieanne Cotton, MD;  Location: [facility] OR;  Service: Radiology;  Laterality: N/A;   TEE WITHOUT CARDIOVERSION N/A 02/21/2021   Procedure: TRANSESOPHAGEAL ECHOCARDIOGRAM (TEE);  Surgeon: Chilton Si, MD;  Location: [facility] ENDOSCOPY;  Service: Cardiovascular;  Laterality: N/A;    There were no vitals filed for this visit.   Subjective Assessment - 04/10/21 1534     Subjective  I hate my vision now - I used to have perfect vision, now everything is blurry    Pertinent History  PMH: polysubstance abuse/tobacco, migrane headaches and obesity    Limitations Fall Risk, Impulsive, L hemi    Patient Stated Goals "get back to normal self" and to "be able to walk to bathroom and pee and pick up my kids".    Currently in Pain? No/denies    Pain Score 0-No pain                          OT Treatments/Exercises (OP) - 04/10/21 0001       ADLs   ADL Education Given Yes    General Comments reviewed short and long term goals      Neurological Re-education Exercises   Other Exercises 1 Neuromuscular reeducation to address LUE isolated control at shoulder, elbow, forearm, wrist and hand. Patient initially with excessive effort and significant compensatory movements and breath holding with all attemts to move LUE.  With facilitation and cueing, able to isolate shoulder flexion with elbow flexion and extension in supine with support and then in low reach patterns in seated and standing.  Patient able to begin to reduce excessive muscle activation and breath holding with guided movements.  Patient able to grasp and release 1 inch block, and 3 inch ball.                      OT  Short Term Goals - 04/10/21 1535       OT SHORT TERM GOAL #1   Title Pt will be independent with initial HEP    Time 4    Period Weeks    Status On-going    Target Date 04/25/21      OT SHORT TERM GOAL #2   Title Pt will report pain no greater than 7/10 with HEP    Baseline 8/10 with shoulder flexion/abduction    Time 4    Period Weeks    Status On-going      OT SHORT TERM GOAL #3   Title Pt will verbalize and demonstrate understanding of visual scanning startegies for visual field cut.    Baseline left visual field cut    Time 4    Period Weeks    Status On-going      OT SHORT TERM GOAL #4   Title Pt will perform environmental scanning with 90% accuracy or greater    Time 4    Period Weeks    Status On-going               OT Long Term Goals - 04/10/21  1538       OT LONG TERM GOAL #1   Title Pt will be independent with updated HEP    Time 9    Period Weeks    Status On-going      OT LONG TERM GOAL #2   Title Pt will perform table top scanning with 95% accuracy or greater with use of visual scanning strategies.    Baseline 89% accuracy with 88's    Time 9    Period Weeks    Status On-going      OT LONG TERM GOAL #3   Title Pt will demonstrate ability to grasp and release cylindrical object and/or 1 inch block with LUE.    Baseline trace volitional composite flexion    Time 9    Period Weeks    Status On-going      OT LONG TERM GOAL #4   Title Pt will demonstrate active LUE movement to reach to low level surface for progression to reaching and obtaining objects    Baseline no active flexion    Time 9    Period Weeks    Status On-going      OT LONG TERM GOAL #5   Title PT will demonsrate using LUE as active and functional stabilizer for 20% or greater of ADLs and IADLs.    Baseline 0%    Time 9    Period Weeks    Status On-going                   Plan - 04/10/21 1640     Clinical Impression Statement Pt showing significant muscle activity in LUE, yet needs to establish control over this movement.  Patient eager for functional improvement and greater independence with ADL/IADL.    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;Decreased knowledge of use of DME;Vision;Sensation;Flexibility;Coordination;IADL;ROM;UE functional use;Tone;Pain;Strength;GMC;Dexterity    Cognitive Skills Attention;Safety Awareness    Rehab Potential Good    Clinical Decision Making Limited treatment options, no task modification necessary    Comorbidities Affecting Occupational Performance: None     Modification or Assistance to Complete Evaluation  No modification of tasks or assist necessary to complete eval    OT Frequency 2x / week    OT Duration Other (comment)   15 visits over 9 weeks d/t any scheduling conflicts   OT Treatment/Interventions Self-care/ADL training;Aquatic Therapy;Electrical Stimulation;Energy conservation;Manual Therapy;Patient/family education;Visual/perceptual remediation/compensation;Passive range of motion;Neuromuscular education;Building services engineer;Therapeutic exercise;Cognitive remediation/compensation;Moist Heat;Fluidtherapy;DME and/or AE instruction;Splinting;Therapeutic activities;Ultrasound    Plan visual strategies, HEP for self PROM for LUE, Neuroreed of LUE    Consulted and Agree with Plan of Care Patient;Family member/caregiver             Patient will benefit from skilled therapeutic intervention in order to improve the following deficits and impairments:   Body Structure / Function / Physical Skills: ADL, Decreased knowledge of use of DME, Vision, Sensation, Flexibility, Coordination, IADL, ROM, UE functional use, Tone, Pain, Strength, GMC, Dexterity Cognitive Skills: Attention, Safety Awareness     Visit Diagnosis: Hemiplegia and hemiparesis following cerebral infarction affecting left non-dominant side (HCC)  Other symptoms and signs involving the nervous system  Unsteadiness on feet  Muscle weakness (generalized)  Other lack of coordination  Acute pain of left shoulder  Attention and concentration deficit    Problem List Patient Active Problem List   Diagnosis Date Noted   Sprain of left ankle    Sleep disturbance    Hyperglycemia    Hypoalbuminemia due to protein-calorie malnutrition (HCC)    Dyslipidemia    Hemiparesis affecting left side as late effect of stroke (HCC)    Lethargy    Cerebrovascular accident (CVA) due to occlusion of right posterior cerebral artery (HCC) 02/22/2021   Cerebrovascular accident  (CVA) (HCC)    Right carotid artery occlusion 02/14/2021    Collier Salina, OT/L 04/10/2021, 4:42 PM  [facility] [address] [place], [place], [address] Phone: [phone]   Fax:  [phone]  Name: David Craig MRN: [id number] Date of Birth: 1986/05/15

## 2021-04-10 NOTE — Therapy (Signed)
[facility] [address] [place], [place], [address] Phone: [phone]   Fax:  [phone]  Physical Therapy Treatment  Patient Details  Name: David Craig MRN: [id number] Date of Birth: 03/23/1986 Referring Provider (PT): Delle Reining, [place]   Encounter Date: 04/10/2021   PT End of Session - 04/10/21 1449     Visit Number 2    Number of Visits 15   2x/week x 7 weeks + eval = 15 visits   Authorization Type BCBS    Authorization - Visit Number 2    Authorization - Number of Visits 15    PT Start Time 1447    PT Stop Time 1528    PT Time Calculation (min) 41 min    Equipment Utilized During Treatment Gait belt    Activity Tolerance Patient tolerated treatment well    Behavior During Therapy Impulsive   Decreased safety awareness noted            History reviewed. No pertinent past medical history.  Past Surgical History:  Procedure Laterality Date   BUBBLE STUDY  02/21/2021   Procedure: BUBBLE STUDY;  Surgeon: Chilton Si, MD;  Location: [facility] ENDOSCOPY;  Service: Cardiovascular;;   IR ANGIO INTRA EXTRACRAN SEL COM CAROTID INNOMINATE UNI L MOD SED  02/15/2021   IR ANGIO VERTEBRAL SEL VERTEBRAL BILAT MOD SED  02/15/2021   IR CT HEAD LTD  02/15/2021   IR PERCUTANEOUS ART THROMBECTOMY/INFUSION INTRACRANIAL INC DIAG ANGIO  02/15/2021   IR US GUIDE VASC ACCESS RIGHT  02/15/2021   RADIOLOGY WITH ANESTHESIA N/A 02/14/2021   Procedure: IR WITH ANESTHESIA;  Surgeon: Julieanne Cotton, MD;  Location: [facility] OR;  Service: Radiology;  Laterality: N/A;   TEE WITHOUT CARDIOVERSION N/A 02/21/2021   Procedure: TRANSESOPHAGEAL ECHOCARDIOGRAM (TEE);  Surgeon: Chilton Si, MD;  Location: [facility] ENDOSCOPY;  Service: Cardiovascular;  Laterality: N/A;    There were no vitals filed for this visit.   Subjective Assessment - 04/10/21 1450     Subjective Patient report sno new changes. No falls, walking with RW. Reports he is trying to  pick up the arm and place it on things, which is going better.    Pertinent History Rt PCA CVA on 02-14-21; h/o polysubstance abuse (cocaine, marijuana, oxycodone), h/o migraines, h/o Lt ankle sprain    Diagnostic tests CT and MRI  -  revealed Rt PCA infarcts    Patient Stated Goals "Doing what I used to do" - take care of my kids (ages 79 1/2 and 1 1/2)    Currently in Pain? No/denies              [facility] Adult PT Treatment/Exercise - 04/10/21 0001       Transfers   Transfers Sit to Stand;Stand to Sit    Sit to Stand 5: Supervision    Stand to Sit 5: Supervision    Comments completed x 10 reps wthout UE support with equal weight bearing, patient favoring RLE. Then completed staggered stance with LLE to further strength, x 10 reps.      Ambulation/Gait   Ambulation/Gait Yes    Ambulation/Gait Assistance 4: Min guard;4: Min assist    Ambulation/Gait Assistance Details Pt wearing L AFO adn RW with hand orthotic. PT providing faciliation at pelvis to work on reduce compensation and imrpoved step length/weight shift. Cues for hip/knee flexion.    Ambulation Distance (Feet) 230 Feet    Assistive device Rolling walker    Gait Pattern Decreased hip/knee flexion -  left;Decreased stance time - left;Decreased weight shift to left;Decreased dorsiflexion - left;Decreased step length - left    Ambulation Surface Level;Indoor      Exercises   Exercises Knee/Hip      Knee/Hip Exercises: Supine   Heel Slides AROM;Strengthening;Left;1 set;5 reps;Limitations    Heel Slides Limitations completed x 5 reps with supine position, requring PT assistance to maintain position. withheld due to assistance required with knee flexion.    Bridges AROM;Strengthening;Both;2 sets;10 reps;Limitations    Bridges Limitations intermittent cues to maintain neutral alignment, due to weakness require increased assistance keeping LLE in position.    Other Supine Knee/Hip Exercises completed alternating bent knee fall outs,  cues for control, x 10 reps bilaterally. With completion on R side, focus on maintaining LLE steady x 10 reps.              Access Code: [email or web address] URL: [email or web address] Date: 04/10/2021 Prepared by: Jethro Bastos   Exercises Supine Bridge - 1 x daily - 5 x weekly - 3 sets - 5 reps Bent Knee Fallouts with Alternating Legs - 1 x daily - 5 x weekly - 3 sets - 5 reps Staggered Sit-to-Stand - 1 x daily - 5 x weekly - 2 sets - 10 reps       PT Education - 04/10/21 1701     Education Details initial HEP; monitor BP    Person(s) Educated Patient    Methods Explanation;Demonstration;Handout    Comprehension Verbalized understanding;Returned demonstration              PT Short Term Goals - 04/05/21 1612       PT SHORT TERM GOAL #1   Title Independent in HEP for LLE strengthening and stretching.    Time 3    Period Weeks    Status New    Target Date 04/27/21      PT SHORT TERM GOAL #2   Title Pt will participate in assessment of Berg balance test.    Time 3    Period Weeks    Status New    Target Date 04/27/21      PT SHORT TERM GOAL #3   Title Pt will amb. with RW 350' with SBA on flat, even surface.    Time 3    Period Weeks    Status New    Target Date 04/27/21      PT SHORT TERM GOAL #4   Title Pt will transfer wheelchair to mat independently without cues to lock brakes.    Baseline SBA but needed assistance with locking brakes    Time 3    Period Weeks    Status New    Target Date 04/27/21               PT Long Term Goals - 04/05/21 1615       PT LONG TERM GOAL #1   Title Pt will amb. 250' with large- based quad cane with CGA.    Time 7    Period Weeks    Status New    Target Date 05/25/21      PT LONG TERM GOAL #2   Title Pt will negotiate steps with 1 hand rail with SBA.    Time 7    Target Date 05/25/21      PT LONG TERM GOAL #3   Title Improve Berg balance score by at least 8 points to demo improvement in  balance and to reduce fall risk.  Time 7    Period Weeks    Status New    Target Date 05/25/21      PT LONG TERM GOAL #4   Title Pt will stand for at least 5" without UE support and reach at least 10" outside BOS independently for increased independence with ADL's.    Time 7    Period Weeks    Status New    Target Date 05/25/21      PT LONG TERM GOAL #5   Title Negotiate ramp and curb with LBQC with CGA.    Time 7    Period Weeks    Status New    Target Date 05/25/21                   Plan - 04/10/21 1709     Clinical Impression Statement Today's session focused on establishing initial supine/seated HEP to strength LLE. most notable challenge with left knee flexion requiring  assistance. Sit <> stands was able to complete with staggered stance to further strength LLE. Contineud gait training with RW with focus on improved step length and faciliation at pelvis for improved gait pattern> Will cotninue to progress toward all LTGs.    Personal Factors and Comorbidities Behavior Pattern;Comorbidity 2;Transportation;Profession    Comorbidities s/p R PCA CVA with Lt hemiparesis, polysubstance abuse, migraines, obesity, h/o Lt sprained ankle    Examination-Activity Limitations Bathing;Locomotion Level;Transfers;Bend;Caring for Others;Dressing;Lift;Stand;Stairs;Squat;Reach Overhead    Examination-Participation Restrictions Cleaning;Community Activity;Driving;Interpersonal Relationship;Laundry;Yard Work;Shop;Occupation;Meal Prep    Stability/Clinical Decision Making Evolving/Moderate complexity    Rehab Potential Good    PT Frequency 2x / week    PT Duration Other (comment)   7 weeks  (15 visit limit)   PT Treatment/Interventions ADLs/Self Care Home Management;DME Instruction;Gait training;Stair training;Therapeutic activities;Therapeutic exercise;Balance training;Neuromuscular re-education;Patient/family education;Passive range of motion;Visual/perceptual remediation/compensation     PT Next Visit Plan review and add to HEP for LLE. continue heel slides. Potential Berg Balance. gait training. reaching/weight shift activities.    Consulted and Agree with Plan of Care Patient             Patient will benefit from skilled therapeutic intervention in order to improve the following deficits and impairments:  Abnormal gait, Decreased activity tolerance, Decreased balance, Decreased safety awareness, Decreased coordination, Decreased strength, Impaired tone, Impaired vision/preception, Impaired UE functional use  Visit Diagnosis: Hemiplegia and hemiparesis following cerebral infarction affecting left non-dominant side (HCC)  Other abnormalities of gait and mobility  Unsteadiness on feet  Muscle weakness (generalized)     Problem List Patient Active Problem List   Diagnosis Date Noted   Sprain of left ankle    Sleep disturbance    Hyperglycemia    Hypoalbuminemia due to protein-calorie malnutrition (HCC)    Dyslipidemia    Hemiparesis affecting left side as late effect of stroke (HCC)    Lethargy    Cerebrovascular accident (CVA) due to occlusion of right posterior cerebral artery (HCC) 02/22/2021   Cerebrovascular accident (CVA) (HCC)    Right carotid artery occlusion 02/14/2021    Tempie Donning, PT, DPT 04/10/2021, 5:12 PM  [facility] [address] [place], [place], [address] Phone: [phone]   Fax:  [phone]  Name: Belen Zwahlen MRN: [id number] Date of Birth: [birth year]

## 2021-04-10 NOTE — Patient Instructions (Signed)
Access Code: [email or web address] URL: [email or web address] Date: 04/10/2021 Prepared by: Jethro Bastos  Exercises Supine Bridge - 1 x daily - 5 x weekly - 3 sets - 5 reps Bent Knee Fallouts with Alternating Legs - 1 x daily - 5 x weekly - 3 sets - 5 reps Staggered Sit-to-Stand - 1 x daily - 5 x weekly - 2 sets - 10 reps

## 2021-04-12 ENCOUNTER — Encounter: Payer: BC Managed Care – PPO | Admitting: Occupational Therapy

## 2021-04-12 ENCOUNTER — Ambulatory Visit: Payer: BC Managed Care – PPO

## 2021-04-12 ENCOUNTER — Other Ambulatory Visit: Payer: Self-pay

## 2021-04-12 ENCOUNTER — Ambulatory Visit: Payer: BC Managed Care – PPO | Admitting: Physical Therapy

## 2021-04-12 VITALS — BP 140/90

## 2021-04-12 DIAGNOSIS — R41840 Attention and concentration deficit: Secondary | ICD-10-CM | POA: Diagnosis not present

## 2021-04-12 DIAGNOSIS — I69354 Hemiplegia and hemiparesis following cerebral infarction affecting left non-dominant side: Secondary | ICD-10-CM | POA: Diagnosis not present

## 2021-04-12 DIAGNOSIS — R471 Dysarthria and anarthria: Secondary | ICD-10-CM | POA: Diagnosis not present

## 2021-04-12 DIAGNOSIS — I63531 Cerebral infarction due to unspecified occlusion or stenosis of right posterior cerebral artery: Secondary | ICD-10-CM | POA: Diagnosis not present

## 2021-04-12 DIAGNOSIS — M6281 Muscle weakness (generalized): Secondary | ICD-10-CM | POA: Diagnosis not present

## 2021-04-12 DIAGNOSIS — R2689 Other abnormalities of gait and mobility: Secondary | ICD-10-CM | POA: Diagnosis not present

## 2021-04-12 DIAGNOSIS — R2681 Unsteadiness on feet: Secondary | ICD-10-CM

## 2021-04-12 DIAGNOSIS — I69854 Hemiplegia and hemiparesis following other cerebrovascular disease affecting left non-dominant side: Secondary | ICD-10-CM | POA: Diagnosis not present

## 2021-04-12 DIAGNOSIS — R29818 Other symptoms and signs involving the nervous system: Secondary | ICD-10-CM | POA: Diagnosis not present

## 2021-04-12 DIAGNOSIS — R41841 Cognitive communication deficit: Secondary | ICD-10-CM | POA: Diagnosis not present

## 2021-04-12 DIAGNOSIS — R278 Other lack of coordination: Secondary | ICD-10-CM | POA: Diagnosis not present

## 2021-04-12 DIAGNOSIS — M25512 Pain in left shoulder: Secondary | ICD-10-CM | POA: Diagnosis not present

## 2021-04-12 NOTE — Therapy (Signed)
[facility] [address] [place], [place], [address] Phone: [phone]   Fax:  [phone]  Physical Therapy Treatment  Patient Details  Name: David Craig MRN: [id number] Date of Birth: January 13, 1986 Referring Provider (PT): Delle Reining, [place]   Encounter Date: 04/12/2021   PT End of Session - 04/12/21 1448     Visit Number 3    Number of Visits 15   2x/week x 7 weeks + eval = 15 visits   Authorization Type BCBS    Authorization - Visit Number 2    Authorization - Number of Visits 15    PT Start Time 1448    PT Stop Time 1530    PT Time Calculation (min) 42 min    Equipment Utilized During Treatment Gait belt    Activity Tolerance Patient tolerated treatment well    Behavior During Therapy Impulsive   Decreased safety awareness noted            No past medical history on file.  Past Surgical History:  Procedure Laterality Date   BUBBLE STUDY  02/21/2021   Procedure: BUBBLE STUDY;  Surgeon: Chilton Si, MD;  Location: [facility] ENDOSCOPY;  Service: Cardiovascular;;   IR ANGIO INTRA EXTRACRAN SEL COM CAROTID INNOMINATE UNI L MOD SED  02/15/2021   IR ANGIO VERTEBRAL SEL VERTEBRAL BILAT MOD SED  02/15/2021   IR CT HEAD LTD  02/15/2021   IR PERCUTANEOUS ART THROMBECTOMY/INFUSION INTRACRANIAL INC DIAG ANGIO  02/15/2021   IR US GUIDE VASC ACCESS RIGHT  02/15/2021   RADIOLOGY WITH ANESTHESIA N/A 02/14/2021   Procedure: IR WITH ANESTHESIA;  Surgeon: Julieanne Cotton, MD;  Location: [facility] OR;  Service: Radiology;  Laterality: N/A;   TEE WITHOUT CARDIOVERSION N/A 02/21/2021   Procedure: TRANSESOPHAGEAL ECHOCARDIOGRAM (TEE);  Surgeon: Chilton Si, MD;  Location: [facility] ENDOSCOPY;  Service: Cardiovascular;  Laterality: N/A;    Vitals:   04/12/21 1457 04/12/21 1519  BP: (!) 138/92 140/90     Subjective Assessment - 04/12/21 1447     Subjective No new changes. Reports the exercises went well. No falls. No pain.     Pertinent History Rt PCA CVA on 02-14-21; h/o polysubstance abuse (cocaine, marijuana, oxycodone), h/o migraines, h/o Lt ankle sprain    Diagnostic tests CT and MRI  -  revealed Rt PCA infarcts    Patient Stated Goals "Doing what I used to do" - take care of my kids (ages 50 1/2 and 1 1/2)    Currently in Pain? No/denies               [facility] Adult PT Treatment/Exercise - 04/12/21 0001       Transfers   Transfers Sit to Stand;Stand to Sit    Sit to Stand 5: Supervision    Stand to Sit 5: Supervision    Comments completed x 10 reps with LLE posterior, PT blocking RLE from moving posterior to compensate      Ambulation/Gait   Ambulation/Gait Yes    Ambulation/Gait Assistance 4: Min guard;4: Min assist    Ambulation/Gait Assistance Details cues to increased hip flexion on LLE to avoid circumduction/ER of LLE. PT providing faciliation at pelvis for improved weight shift. Cues for improved step length to promote weight shift.    Ambulation Distance (Feet) 230 Feet   x 1, 80 x 1   Assistive device Rolling walker    Gait Pattern Decreased hip/knee flexion - left;Decreased stance time - left;Decreased weight shift to left;Decreased dorsiflexion - left;Decreased  step length - left    Ambulation Surface Level;Indoor      Self-Care   Self-Care Other Self-Care Comments    Other Self-Care Comments  PT educating patient/wife on monitoring BP at home.      Neuro Re-ed    Neuro Re-ed Details  standing without UE support, PT helping faciliate equal weight shift. Then completed lateral weight shift to L with reaching across midline with RUE x 10 reps. PT providing cues for proper technique. Then with single UE support completed steps forward with RLE to target x 15 reps working on imrpoved weight shift to LLE.      Exercises   Exercises Other Exercises    Other Exercises  Completed supine heel slides 2 x 5 reps, cues to avoid holding breath and focus on inhael/exhale with activity. Min A required.                 PT Short Term Goals - 04/05/21 1612       PT SHORT TERM GOAL #1   Title Independent in HEP for LLE strengthening and stretching.    Time 3    Period Weeks    Status New    Target Date 04/27/21      PT SHORT TERM GOAL #2   Title Pt will participate in assessment of Berg balance test.    Time 3    Period Weeks    Status New    Target Date 04/27/21      PT SHORT TERM GOAL #3   Title Pt will amb. with RW 350' with SBA on flat, even surface.    Time 3    Period Weeks    Status New    Target Date 04/27/21      PT SHORT TERM GOAL #4   Title Pt will transfer wheelchair to mat independently without cues to lock brakes.    Baseline SBA but needed assistance with locking brakes    Time 3    Period Weeks    Status New    Target Date 04/27/21               PT Long Term Goals - 04/05/21 1615       PT LONG TERM GOAL #1   Title Pt will amb. 250' with large- based quad cane with CGA.    Time 7    Period Weeks    Status New    Target Date 05/25/21      PT LONG TERM GOAL #2   Title Pt will negotiate steps with 1 hand rail with SBA.    Time 7    Target Date 05/25/21      PT LONG TERM GOAL #3   Title Improve Berg balance score by at least 8 points to demo improvement in balance and to reduce fall risk.    Time 7    Period Weeks    Status New    Target Date 05/25/21      PT LONG TERM GOAL #4   Title Pt will stand for at least 5" without UE support and reach at least 10" outside BOS independently for increased independence with ADL's.    Time 7    Period Weeks    Status New    Target Date 05/25/21      PT LONG TERM GOAL #5   Title Negotiate ramp and curb with LBQC with CGA.    Time 7    Period Weeks  Status New    Target Date 05/25/21                   Plan - 04/12/21 1547     Clinical Impression Statement Continue to have slight elevation in BP, PT educating on monitoring at home and educating wife on how to do this properly.  Continued gait training and NMR focused on improved weight shift/stance on LLE. Patient tolerating well overall, intermittent rest breaks required. Will continue per POC.    Personal Factors and Comorbidities Behavior Pattern;Comorbidity 2;Transportation;Profession    Comorbidities s/p R PCA CVA with Lt hemiparesis, polysubstance abuse, migraines, obesity, h/o Lt sprained ankle    Examination-Activity Limitations Bathing;Locomotion Level;Transfers;Bend;Caring for Others;Dressing;Lift;Stand;Stairs;Squat;Reach Overhead    Examination-Participation Restrictions Cleaning;Community Activity;Driving;Interpersonal Relationship;Laundry;Yard Work;Shop;Occupation;Meal Prep    Stability/Clinical Decision Making Evolving/Moderate complexity    Rehab Potential Good    PT Frequency 2x / week    PT Duration Other (comment)   7 weeks  (15 visit limit)   PT Treatment/Interventions ADLs/Self Care Home Management;DME Instruction;Gait training;Stair training;Therapeutic activities;Therapeutic exercise;Balance training;Neuromuscular re-education;Patient/family education;Passive range of motion;Visual/perceptual remediation/compensation    PT Next Visit Plan review and add to HEP for LLE. continue heel slides. Potential Berg Balance. gait training. reaching/weight shift activities.    Consulted and Agree with Plan of Care Patient             Patient will benefit from skilled therapeutic intervention in order to improve the following deficits and impairments:  Abnormal gait, Decreased activity tolerance, Decreased balance, Decreased safety awareness, Decreased coordination, Decreased strength, Impaired tone, Impaired vision/preception, Impaired UE functional use  Visit Diagnosis: Other abnormalities of gait and mobility  Unsteadiness on feet  Muscle weakness (generalized)  Hemiplegia and hemiparesis following cerebral infarction affecting left non-dominant side [facility])     Problem List Patient Active  Problem List   Diagnosis Date Noted   Sprain of left ankle    Sleep disturbance    Hyperglycemia    Hypoalbuminemia due to protein-calorie malnutrition (HCC)    Dyslipidemia    Hemiparesis affecting left side as late effect of stroke (HCC)    Lethargy    Cerebrovascular accident (CVA) due to occlusion of right posterior cerebral artery (HCC) 02/22/2021   Cerebrovascular accident (CVA) (HCC)    Right carotid artery occlusion 02/14/2021    Tempie Donning, PT, DPT 04/12/2021, 4:56 PM  [facility] [address] [place], [place], [address] Phone: [phone]   Fax:  [phone]  Name: David Craig MRN: [id number] Date of Birth: [birth year]

## 2021-04-12 NOTE — Patient Instructions (Signed)
  SLOW LOUD OVER-ENNUNCIATE PAUSE  PA TA KA  PATA [place]AK[place] PATAKA  BUTTERCUP  CATERPILLAR  BASEBALLL PLAYER  [place]  [place]  SLOW AND BIG - EXAGGERATE YOUR MOUTH, MAKE EACH CONSONANT     Please review Speech Therapy recommendations in your folder and complete tongue twisters. Bring folder back to therapy next time.    See if you get access to MyChart so you can review your appointments on your phone

## 2021-04-12 NOTE — Therapy (Signed)
[facility] [address] [place], [place], [address] Phone: [phone]   Fax:  [phone]  Speech Language Pathology Treatment  Patient Details  Name: David Craig MRN: [id number] Date of Birth: Jan 05, 1986 Referring Provider (SLP): Jacquelynn Cree, [place]   Encounter Date: 04/12/2021   End of Session - 04/12/21 1526     Visit Number 2    Number of Visits 17    Date for SLP Re-Evaluation 05/25/21    Authorization Type BCBS    SLP Start Time 1530    SLP Stop Time  1615    SLP Time Calculation (min) 45 min    Activity Tolerance Patient tolerated treatment well             History reviewed. No pertinent past medical history.  Past Surgical History:  Procedure Laterality Date   BUBBLE STUDY  02/21/2021   Procedure: BUBBLE STUDY;  Surgeon: Chilton Si, MD;  Location: [facility] ENDOSCOPY;  Service: Cardiovascular;;   IR ANGIO INTRA EXTRACRAN SEL COM CAROTID INNOMINATE UNI L MOD SED  02/15/2021   IR ANGIO VERTEBRAL SEL VERTEBRAL BILAT MOD SED  02/15/2021   IR CT HEAD LTD  02/15/2021   IR PERCUTANEOUS ART THROMBECTOMY/INFUSION INTRACRANIAL INC DIAG ANGIO  02/15/2021   IR US GUIDE VASC ACCESS RIGHT  02/15/2021   RADIOLOGY WITH ANESTHESIA N/A 02/14/2021   Procedure: IR WITH ANESTHESIA;  Surgeon: Julieanne Cotton, MD;  Location: [facility] OR;  Service: Radiology;  Laterality: N/A;   TEE WITHOUT CARDIOVERSION N/A 02/21/2021   Procedure: TRANSESOPHAGEAL ECHOCARDIOGRAM (TEE);  Surgeon: Chilton Si, MD;  Location: [facility] ENDOSCOPY;  Service: Cardiovascular;  Laterality: N/A;    There were no vitals filed for this visit.   Subjective Assessment - 04/12/21 1527     Subjective "it's better"    Currently in Pain? No/denies                   ADULT SLP TREATMENT - 04/12/21 1526       General Information   Behavior/Cognition Alert;Cooperative;Pleasant mood      Treatment Provided   Treatment provided  Cognitive-Linquistic      Cognitive-Linquistic Treatment   Treatment focused on Cognition;Dysarthria    Skilled Treatment Pt was accompanied by his wife, David Craig. Pt reports cognition is "better" but speech is still "slurred." Cognitive PROM completed today, in which pt marked "a lot" for managing finanical tasks and managing appointments. With mod questioning cues, pt was reportedly not completing these tasks prior. SLP discussed possible activities for patient to resume at home, which was narrowed down to feeding the dogs and helping with their sons' nighttime routine. Pt continues to c/o feeling "foggy" and "slower." Some delayed processing reported for watching videos. SLP targeted dysarthria compensations with modeling and demo provided. Pt benefited from usual fading to rare cues to over-articulate at word and sentence level. Reduced left facial and lingual ROM and coordination exhibited. Oral motor exercises provided to address.      Assessment / Recommendations / Plan   Plan Continue with current plan of care      Progression Toward Goals   Progression toward goals Progressing toward goals              SLP Education - 04/12/21 1730     Education Details HEP, reminders recommendations, dysarthria HEP    Person(s) Educated Patient    Methods Explanation;Demonstration;Handout;Verbal cues    Comprehension Verbalized understanding;Returned demonstration;Need further instruction  SLP Short Term Goals - 04/12/21 1526       SLP SHORT TERM GOAL #1   Title Pt will complete HEP with rare min A over 3 sessions    Time 4    Period Weeks    Status On-going      SLP SHORT TERM GOAL #2   Title Pt will comprehend and sort medications in role play medication management tasks with 90% accuracy    Time 4    Period Weeks    Status On-going      SLP SHORT TERM GOAL #3   Title Pt will comprehend and complete finanical management tasks with 80% accuracy over 2 sesssions     Time 4    Period Weeks    Status On-going      SLP SHORT TERM GOAL #4   Title Pt will demonstrate dysarthria compensations to be 100% intelligble in 10 minute conversations over 2 sessions    Time 4    Period Weeks    Status On-going      SLP SHORT TERM GOAL #5   Title Pt will complete cognitive communication PROM in first few sessions    Time 4    Period Weeks    Status Achieved              SLP Long Term Goals - 04/12/21 1527       SLP LONG TERM GOAL #1   Title Pt will verbalize and demonstrate memory/attention compensations to aid daily functioning given rare min A over 2 sessions    Time 8    Period Weeks    Status On-going      SLP LONG TERM GOAL #2   Title Pt will independently manage medications with rare min A from wife over 4 sessions    Time 8    Period Weeks    Status On-going      SLP LONG TERM GOAL #3   Title Pt will demonstrate dysarthria compensations to be 100% intelligble in 20+ minute conversations over 2 sessions    Time 8    Period Weeks    Status On-going      SLP LONG TERM GOAL #4   Title Pt will report improved cognition and communication via PROM by 2 points at last ST session    Time 8    Period Weeks    Status On-going              Plan - 04/12/21 1526     Clinical Impression Statement "David Craig" was referred for OPST intervention secondary to CVA in September 2022. Some improvements reported for cognition and speech, but pt reported he has yet to return to baseline. Pt endorsed intermittent "slurred speech" and more reliance on wife to complete cognitive tasks, including managing appointments, medications, and finances. With further prompting, patient was not managing these tasks independently prior. Cognitive PROM completed with no overt challenges reported by patient or wife. Dysarthria compensations and HEP educated and provided today. Reduced labial and lingual ROM and coordination exhibited. Pt desires to return to baseline. Pt  would benefit from skilled ST targeting cognitive linguistic skills and dysarthria to maximize return to PLOF.    Speech Therapy Frequency 2x / week    Duration 8 weeks    Treatment/Interventions Compensatory strategies;Cueing hierarchy;Functional tasks;Cognitive reorganization;Multimodal communcation approach;Language facilitation;Compensatory techniques;Internal/external aids;SLP instruction and feedback    Potential to Achieve Goals Good    SLP Home Exercise Plan provided    Consulted  and Agree with Plan of Care Patient             Patient will benefit from skilled therapeutic intervention in order to improve the following deficits and impairments:   Cognitive communication deficit  Dysarthria and anarthria    Problem List Patient Active Problem List   Diagnosis Date Noted   Sprain of left ankle    Sleep disturbance    Hyperglycemia    Hypoalbuminemia due to protein-calorie malnutrition (HCC)    Dyslipidemia    Hemiparesis affecting left side as late effect of stroke (HCC)    Lethargy    Cerebrovascular accident (CVA) due to occlusion of right posterior cerebral artery (HCC) 02/22/2021   Cerebrovascular accident (CVA) (HCC)    Right carotid artery occlusion 02/14/2021    Janann Colonel, MA CCC-SLP 04/12/2021, 5:33 PM  [facility] [address] [place], [place], [address] Phone: [phone]   Fax:  [phone]   Name: David Craig MRN: [id number] Date of Birth: [birth year]

## 2021-04-13 DIAGNOSIS — I679 Cerebrovascular disease, unspecified: Secondary | ICD-10-CM | POA: Diagnosis not present

## 2021-04-18 ENCOUNTER — Encounter: Payer: Self-pay | Admitting: Occupational Therapy

## 2021-04-18 ENCOUNTER — Other Ambulatory Visit: Payer: Self-pay

## 2021-04-18 ENCOUNTER — Ambulatory Visit: Payer: BC Managed Care – PPO | Admitting: Occupational Therapy

## 2021-04-18 ENCOUNTER — Ambulatory Visit: Payer: BC Managed Care – PPO

## 2021-04-18 VITALS — BP 132/82

## 2021-04-18 DIAGNOSIS — M25512 Pain in left shoulder: Secondary | ICD-10-CM | POA: Diagnosis not present

## 2021-04-18 DIAGNOSIS — I69354 Hemiplegia and hemiparesis following cerebral infarction affecting left non-dominant side: Secondary | ICD-10-CM

## 2021-04-18 DIAGNOSIS — R2689 Other abnormalities of gait and mobility: Secondary | ICD-10-CM

## 2021-04-18 DIAGNOSIS — M6281 Muscle weakness (generalized): Secondary | ICD-10-CM

## 2021-04-18 DIAGNOSIS — I63531 Cerebral infarction due to unspecified occlusion or stenosis of right posterior cerebral artery: Secondary | ICD-10-CM | POA: Diagnosis not present

## 2021-04-18 DIAGNOSIS — R29818 Other symptoms and signs involving the nervous system: Secondary | ICD-10-CM | POA: Diagnosis not present

## 2021-04-18 DIAGNOSIS — R41841 Cognitive communication deficit: Secondary | ICD-10-CM | POA: Diagnosis not present

## 2021-04-18 DIAGNOSIS — R41840 Attention and concentration deficit: Secondary | ICD-10-CM | POA: Diagnosis not present

## 2021-04-18 DIAGNOSIS — I69854 Hemiplegia and hemiparesis following other cerebrovascular disease affecting left non-dominant side: Secondary | ICD-10-CM | POA: Diagnosis not present

## 2021-04-18 DIAGNOSIS — R471 Dysarthria and anarthria: Secondary | ICD-10-CM | POA: Diagnosis not present

## 2021-04-18 DIAGNOSIS — R2681 Unsteadiness on feet: Secondary | ICD-10-CM | POA: Diagnosis not present

## 2021-04-18 DIAGNOSIS — R278 Other lack of coordination: Secondary | ICD-10-CM

## 2021-04-18 NOTE — Therapy (Signed)
[facility] [address] [place], [place], [address] Phone: [phone]   Fax:  [phone]  Occupational Therapy Treatment  Patient Details  Name: David Craig MRN: [id number] Date of Birth: 20-Jan-1986 Referring Provider (OT): Marissa Nestle   Encounter Date: 04/18/2021   OT End of Session - 04/18/21 1149     Visit Number 3    Number of Visits 15    Date for OT Re-Evaluation 05/30/21    Authorization Type BCBS    Authorization Time Period VL: 30 (PT,OT)    Authorization - Visit Number 2    Authorization - Number of Visits 15    OT Start Time 1148    OT Stop Time 1230    OT Time Calculation (min) 42 min    Activity Tolerance Patient tolerated treatment well    Behavior During Therapy [facility] for tasks assessed/performed             History reviewed. No pertinent past medical history.  Past Surgical History:  Procedure Laterality Date   BUBBLE STUDY  02/21/2021   Procedure: BUBBLE STUDY;  Surgeon: Chilton Si, MD;  Location: [facility] ENDOSCOPY;  Service: Cardiovascular;;   IR ANGIO INTRA EXTRACRAN SEL COM CAROTID INNOMINATE UNI L MOD SED  02/15/2021   IR ANGIO VERTEBRAL SEL VERTEBRAL BILAT MOD SED  02/15/2021   IR CT HEAD LTD  02/15/2021   IR PERCUTANEOUS ART THROMBECTOMY/INFUSION INTRACRANIAL INC DIAG ANGIO  02/15/2021   IR US GUIDE VASC ACCESS RIGHT  02/15/2021   RADIOLOGY WITH ANESTHESIA N/A 02/14/2021   Procedure: IR WITH ANESTHESIA;  Surgeon: Julieanne Cotton, MD;  Location: [facility] OR;  Service: Radiology;  Laterality: N/A;   TEE WITHOUT CARDIOVERSION N/A 02/21/2021   Procedure: TRANSESOPHAGEAL ECHOCARDIOGRAM (TEE);  Surgeon: Chilton Si, MD;  Location: [facility] ENDOSCOPY;  Service: Cardiovascular;  Laterality: N/A;    There were no vitals filed for this visit.   Subjective Assessment - 04/18/21 1149     Subjective  Pt denies any pain. Pt reports blurriness with vision all over.    Patient is accompanied by: Family  member   mom   Pertinent History PMH: polysubstance abuse/tobacco, migrane headaches and obesity    Limitations Fall Risk, Impulsive, L hemi    Patient Stated Goals "get back to normal self" and to "be able to walk to bathroom and pee and pick up my kids".    Currently in Pain? No/denies    Pain Score 0-No pain                [facility] OT Assessment - 04/18/21 1238       Vision Assessment   Ocular Range of Motion Within Functional Limits    Alignment/Gaze Preference Within Defined Limits    Tracking/Visual Pursuits Decreased smoothness of horizontal tracking    Saccades Additional eye shifts occurred during testing    Convergence Within functional limits    Visual Fields Left visual field deficit    Acuity Impaired - to be further tested in functional context    Comment pt reports both eyes are blurry however L eye is more blurry than R                      OT Treatments/Exercises (OP) - 04/18/21 0001       Neurological Re-education Exercises   Other Exercises 1 Using UE Ranger with LUE for working on tapping into anterior delt and seratus anterior for forward reaching. Pt with chest  elevation, shoulder elevation and wrist flexion for forward reaching and req'd mod cues (tactile, visual and verbal) for normal movement patterns.    Other Weight-Bearing Exercises 1 in sitting on edge of mat and standing with hands on elevated hi-lo mat and req'd max A for maintaining hand position on 2 inch block and reaching with RUE for increased weight bearing and wrist extension stretching for LUE. Pt reached with RUE to place and remove resistance clothespins 1-8#.                      OT Short Term Goals - 04/10/21 1535       OT SHORT TERM GOAL #1   Title Pt will be independent with initial HEP    Time 4    Period Weeks    Status On-going    Target Date 04/25/21      OT SHORT TERM GOAL #2   Title Pt will report pain no greater than 7/10 with HEP    Baseline  8/10 with shoulder flexion/abduction    Time 4    Period Weeks    Status On-going      OT SHORT TERM GOAL #3   Title Pt will verbalize and demonstrate understanding of visual scanning startegies for visual field cut.    Baseline left visual field cut    Time 4    Period Weeks    Status On-going      OT SHORT TERM GOAL #4   Title Pt will perform environmental scanning with 90% accuracy or greater    Time 4    Period Weeks    Status On-going               OT Long Term Goals - 04/10/21 1538       OT LONG TERM GOAL #1   Title Pt will be independent with updated HEP    Time 9    Period Weeks    Status On-going      OT LONG TERM GOAL #2   Title Pt will perform table top scanning with 95% accuracy or greater with use of visual scanning strategies.    Baseline 89% accuracy with 88's    Time 9    Period Weeks    Status On-going      OT LONG TERM GOAL #3   Title Pt will demonstrate ability to grasp and release cylindrical object and/or 1 inch block with LUE.    Baseline trace volitional composite flexion    Time 9    Period Weeks    Status On-going      OT LONG TERM GOAL #4   Title Pt will demonstrate active LUE movement to reach to low level surface for progression to reaching and obtaining objects    Baseline no active flexion    Time 9    Period Weeks    Status On-going      OT LONG TERM GOAL #5   Title PT will demonsrate using LUE as active and functional stabilizer for 20% or greater of ADLs and IADLs.    Baseline 0%    Time 9    Period Weeks    Status On-going                   Plan - 04/18/21 1247     Clinical Impression Statement Pt responsive to feedback re: muscle movement patterns in LUE but continues to shows excessive chest elevation and shoulder elevation.  OT Occupational Profile and History Problem Focused Assessment - Including review of records relating to presenting problem    Occupational performance deficits (Please refer to  evaluation for details): IADL's;ADL's;Leisure;Work    Games developer / Function / Physical Skills ADL;Decreased knowledge of use of DME;Vision;Sensation;Flexibility;Coordination;IADL;ROM;UE functional use;Tone;Pain;Strength;GMC;Dexterity    Cognitive Skills Attention;Safety Awareness    Rehab Potential Good    Clinical Decision Making Limited treatment options, no task modification necessary    Comorbidities Affecting Occupational Performance: None    Modification or Assistance to Complete Evaluation  No modification of tasks or assist necessary to complete eval    OT Frequency 2x / week    OT Duration Other (comment)   15 visits over 9 weeks d/t any scheduling conflicts   OT Treatment/Interventions Self-care/ADL training;Aquatic Therapy;Electrical Stimulation;Energy conservation;Manual Therapy;Patient/family education;Visual/perceptual remediation/compensation;Passive range of motion;Neuromuscular education;Building services engineer;Therapeutic exercise;Cognitive remediation/compensation;Moist Heat;Fluidtherapy;DME and/or AE instruction;Splinting;Therapeutic activities;Ultrasound    Plan visual strategies, HEP for self PROM for LUE, Neuroreed of LUE    Consulted and Agree with Plan of Care Patient;Family member/caregiver             Patient will benefit from skilled therapeutic intervention in order to improve the following deficits and impairments:   Body Structure / Function / Physical Skills: ADL, Decreased knowledge of use of DME, Vision, Sensation, Flexibility, Coordination, IADL, ROM, UE functional use, Tone, Pain, Strength, GMC, Dexterity Cognitive Skills: Attention, Safety Awareness     Visit Diagnosis: Other lack of coordination  Attention and concentration deficit  Unsteadiness on feet  Hemiplegia and hemiparesis following cerebral infarction affecting left non-dominant side (HCC)  Acute pain of left shoulder    Problem List Patient Active Problem List    Diagnosis Date Noted   Sprain of left ankle    Sleep disturbance    Hyperglycemia    Hypoalbuminemia due to protein-calorie malnutrition (HCC)    Dyslipidemia    Hemiparesis affecting left side as late effect of stroke (HCC)    Lethargy    Cerebrovascular accident (CVA) due to occlusion of right posterior cerebral artery (HCC) 02/22/2021   Cerebrovascular accident (CVA) (HCC)    Right carotid artery occlusion 02/14/2021    Junious Dresser, OT/L 04/18/2021, 12:54 PM  [facility] [address] [place], [place], [address] Phone: [phone]   Fax:  [phone]  Name: David Craig MRN: [id number] Date of Birth: Oct 26, 1985

## 2021-04-18 NOTE — Therapy (Signed)
[facility] [address] [place], [place], [address] Phone: [phone]   Fax:  [phone]  Speech Language Pathology Treatment  Patient Details  Name: David Craig MRN: [id number] Date of Birth: 05-Mar-1986 Referring Provider (SLP): Jacquelynn Cree, [place]   Encounter Date: 04/18/2021   End of Session - 04/18/21 1136     Visit Number 3    Number of Visits 17    Date for SLP Re-Evaluation 05/25/21    Authorization Type BCBS    SLP Start Time 1118   pt arrived late due to cancelled transportation   SLP Stop Time  1145    SLP Time Calculation (min) 27 min    Activity Tolerance Patient tolerated treatment well             History reviewed. No pertinent past medical history.  Past Surgical History:  Procedure Laterality Date   BUBBLE STUDY  02/21/2021   Procedure: BUBBLE STUDY;  Surgeon: Chilton Si, MD;  Location: [facility] ENDOSCOPY;  Service: Cardiovascular;;   IR ANGIO INTRA EXTRACRAN SEL COM CAROTID INNOMINATE UNI L MOD SED  02/15/2021   IR ANGIO VERTEBRAL SEL VERTEBRAL BILAT MOD SED  02/15/2021   IR CT HEAD LTD  02/15/2021   IR PERCUTANEOUS ART THROMBECTOMY/INFUSION INTRACRANIAL INC DIAG ANGIO  02/15/2021   IR US GUIDE VASC ACCESS RIGHT  02/15/2021   RADIOLOGY WITH ANESTHESIA N/A 02/14/2021   Procedure: IR WITH ANESTHESIA;  Surgeon: Julieanne Cotton, MD;  Location: [facility] OR;  Service: Radiology;  Laterality: N/A;   TEE WITHOUT CARDIOVERSION N/A 02/21/2021   Procedure: TRANSESOPHAGEAL ECHOCARDIOGRAM (TEE);  Surgeon: Chilton Si, MD;  Location: [facility] ENDOSCOPY;  Service: Cardiovascular;  Laterality: N/A;    There were no vitals filed for this visit.   Subjective Assessment - 04/18/21 1119     Subjective "it's been crazy" pt arrived 18 minutes late due to transportation    Currently in Pain? No/denies                   ADULT SLP TREATMENT - 04/18/21 1119       General Information    Behavior/Cognition Alert;Cooperative;Pleasant mood      Treatment Provided   Treatment provided Cognitive-Linquistic      Cognitive-Linquistic Treatment   Treatment focused on Cognition;Dysarthria    Skilled Treatment Pt arrived late due to transportation. Pt put therapy appointments in phone until December per SLP recommendation to aid management of appointments. Pt completed tongue twisters, with some improved clarity of speech reported. SLP intermittently cued and demo'd over-articulation at sentence level. SLP again re-iterated focus on speech related tasks versus oral motor exercises, but SLP believes oral motor exercises would not be harmful to try if patient desired to complete. Mother attended session, in which she reported some possible change in multitasking ability. SLP trialed alternating/divided attention tasks with TedTalk playing, in which pt noted to miss components x5 when double checked at end. Pt benefited from more systematic sequencing to aid accuracy and attention, which pt independently implemented as task progressed.      Assessment / Recommendations / Plan   Plan Continue with current plan of care      Progression Toward Goals   Progression toward goals Progressing toward goals              SLP Education - 04/18/21 1406     Education Details HEP, focus on speech related tasks (oral motor exercises okay to do if you Craig)  Person(s) Educated Patient;Parent(s)    Methods Explanation;Demonstration;Verbal cues;Handout    Comprehension Verbalized understanding;Returned demonstration;Need further instruction              SLP Short Term Goals - 04/18/21 1136       SLP SHORT TERM GOAL #1   Title Pt will complete HEP with rare min A over 3 sessions    Baseline 04-18-21    Time 3    Period Weeks    Status On-going      SLP SHORT TERM GOAL #2   Title Pt will comprehend and sort medications in role play medication management tasks with 90% accuracy    Time  3    Period Weeks    Status On-going      SLP SHORT TERM GOAL #3   Title Pt will comprehend and complete finanical management tasks with 80% accuracy over 2 sesssions    Time 3    Period Weeks    Status On-going      SLP SHORT TERM GOAL #4   Title Pt will demonstrate dysarthria compensations to be 100% intelligble in 10 minute conversations over 2 sessions    Time 3    Period Weeks    Status On-going      SLP SHORT TERM GOAL #5   Title Pt will complete cognitive communication PROM in first few sessions    Status Achieved              SLP Long Term Goals - 04/18/21 1137       SLP LONG TERM GOAL #1   Title Pt will verbalize and demonstrate memory/attention compensations to aid daily functioning given rare min A over 2 sessions    Time 7    Period Weeks    Status On-going      SLP LONG TERM GOAL #2   Title Pt will independently manage medications with rare min A from wife over 4 sessions    Time 7    Period Weeks    Status On-going      SLP LONG TERM GOAL #3   Title Pt will demonstrate dysarthria compensations to be 100% intelligble in 20+ minute conversations over 2 sessions    Time 7    Period Weeks    Status On-going      SLP LONG TERM GOAL #4   Title Pt will report improved cognition and communication via PROM by 2 points at last ST session    Time 7    Period Weeks    Status On-going              Plan - 04/18/21 1136     Clinical Impression Statement "David Craig" was referred for OPST intervention secondary to CVA in September 2022. Some improvements reported for cognition and speech, but pt reported he has yet to return to baseline. Dysarthria compensations and HEP reviewed today. Intermittent min A required for over-articulation for sentence level tasks. Increased labial ROM and coordination exhibited with use over-articulation. SLP targeted divided/alternating attention, which did appear to impact accuracy and attention. Pt benefited from double checking.  Pt desires to return to baseline. Pt would benefit from skilled ST targeting cognitive linguistic skills and dysarthria to maximize return to PLOF.    Speech Therapy Frequency 2x / week    Duration 8 weeks    Treatment/Interventions Compensatory strategies;Cueing hierarchy;Functional tasks;Cognitive reorganization;Multimodal communcation approach;Language facilitation;Compensatory techniques;Internal/external aids;SLP instruction and feedback    Potential to Achieve Goals Good  SLP Home Exercise Plan provided    Consulted and Agree with Plan of Care Patient             Patient will benefit from skilled therapeutic intervention in order to improve the following deficits and impairments:   Cognitive communication deficit  Dysarthria and anarthria    Problem List Patient Active Problem List   Diagnosis Date Noted   Sprain of left ankle    Sleep disturbance    Hyperglycemia    Hypoalbuminemia due to protein-calorie malnutrition (HCC)    Dyslipidemia    Hemiparesis affecting left side as late effect of stroke (HCC)    Lethargy    Cerebrovascular accident (CVA) due to occlusion of right posterior cerebral artery (HCC) 02/22/2021   Cerebrovascular accident (CVA) (HCC)    Right carotid artery occlusion 02/14/2021    Janann Colonel, MA CCC-SLP 04/18/2021, 2:09 PM  [facility] [address] [place], [place], [address] Phone: [phone]   Fax:  [phone]   Name: David Craig MRN: [id number] Date of Birth: 1985/12/12

## 2021-04-18 NOTE — Therapy (Signed)
[facility] [address] [place], [place], [address] Phone: [phone]   Fax:  [phone]  Physical Therapy Treatment  Patient Details  Name: David Craig MRN: [id number] Date of Birth: [birth year] Referring Provider (PT): Delle Reining, [place]   Encounter Date: 04/18/2021   PT End of Session - 04/18/21 1232     Visit Number 4    Number of Visits 15   2x/week x 7 weeks + eval = 15 visits   Authorization Type BCBS    Authorization - Visit Number 3    Authorization - Number of Visits 15    PT Start Time 1232    PT Stop Time 1312    PT Time Calculation (min) 40 min    Equipment Utilized During Treatment Gait belt    Activity Tolerance Patient tolerated treatment well    Behavior During Therapy Impulsive   Decreased safety awareness noted            History reviewed. No pertinent past medical history.  Past Surgical History:  Procedure Laterality Date   BUBBLE STUDY  02/21/2021   Procedure: BUBBLE STUDY;  Surgeon: Chilton Si, MD;  Location: [facility] ENDOSCOPY;  Service: Cardiovascular;;   IR ANGIO INTRA EXTRACRAN SEL COM CAROTID INNOMINATE UNI L MOD SED  02/15/2021   IR ANGIO VERTEBRAL SEL VERTEBRAL BILAT MOD SED  02/15/2021   IR CT HEAD LTD  02/15/2021   IR PERCUTANEOUS ART THROMBECTOMY/INFUSION INTRACRANIAL INC DIAG ANGIO  02/15/2021   IR US GUIDE VASC ACCESS RIGHT  02/15/2021   RADIOLOGY WITH ANESTHESIA N/A 02/14/2021   Procedure: IR WITH ANESTHESIA;  Surgeon: Julieanne Cotton, MD;  Location: [facility] OR;  Service: Radiology;  Laterality: N/A;   TEE WITHOUT CARDIOVERSION N/A 02/21/2021   Procedure: TRANSESOPHAGEAL ECHOCARDIOGRAM (TEE);  Surgeon: Chilton Si, MD;  Location: [facility] ENDOSCOPY;  Service: Cardiovascular;  Laterality: N/A;    Vitals:   04/18/21 1237  BP: 132/82     Subjective Assessment - 04/18/21 1232     Subjective No new changes/complaints. No falls to report. No pain.    Pertinent History Rt PCA  CVA on 02-14-21; h/o polysubstance abuse (cocaine, marijuana, oxycodone), h/o migraines, h/o Lt ankle sprain    Diagnostic tests CT and MRI  -  revealed Rt PCA infarcts    Patient Stated Goals "Doing what I used to do" - take care of my kids (ages 51 1/2 and 1 1/2)    Currently in Pain? No/denies               [facility] Adult PT Treatment/Exercise - 04/18/21 0001       Transfers   Transfers Sit to Stand;Stand to Sit    Sit to Stand 5: Supervision    Stand to Sit 5: Supervision      Ambulation/Gait   Ambulation/Gait Yes    Ambulation/Gait Assistance 4: Min guard;4: Min assist    Ambulation/Gait Assistance Details completed gait training with RW and AFO/hand grip on RW completed ambulation x 350 ft. PT providing min A intermittent for LLE working on improved placement. PT also providing faciliation at pelvis for improved step length and weight shift.    Ambulation Distance (Feet) 350 Feet    Assistive device Rolling walker   with L hand orthotic   Gait Pattern Decreased hip/knee flexion - left;Decreased stance time - left;Decreased weight shift to left;Decreased dorsiflexion - left;Decreased step length - left    Ambulation Surface Level;Indoor    Gait Comments Patient requring  assistance for proper placement of L AFO into shoe to promote benefit of use.      Neuro Re-ed    Neuro Re-ed Details  in tall kneeling on mat with bench for UE support through RUE and LUE placed on pebble completed the following: completed R/L weight shifts x 10 reps, then completed lateral weight shift onto LLE with reaching across midline with RUE to PT hand x 10 reps. Cues to promote improved movement pattern. then completed hip hinges with single UE support > no UE support working on hip extension x 15 reps, PT providing tactile cues at pelvis for equal weight shift.      Exercises   Exercises Other Exercises    Other Exercises  seated at EOM completed heel slides x 5 reps with towel under LLE, then completed  with bolster x 8 reps owrking on improved hamstring activation/strength. also cues for focus on control with eccentric movement.                PT Education - 04/18/21 1321     Education Details use of RW and AFO for safety within the home.    Person(s) Educated Patient;Parent(s)    Methods Explanation    Comprehension Verbalized understanding              PT Short Term Goals - 04/05/21 1612       PT SHORT TERM GOAL #1   Title Independent in HEP for LLE strengthening and stretching.    Time 3    Period Weeks    Status New    Target Date 04/27/21      PT SHORT TERM GOAL #2   Title Pt will participate in assessment of Berg balance test.    Time 3    Period Weeks    Status New    Target Date 04/27/21      PT SHORT TERM GOAL #3   Title Pt will amb. with RW 350' with SBA on flat, even surface.    Time 3    Period Weeks    Status New    Target Date 04/27/21      PT SHORT TERM GOAL #4   Title Pt will transfer wheelchair to mat independently without cues to lock brakes.    Baseline SBA but needed assistance with locking brakes    Time 3    Period Weeks    Status New    Target Date 04/27/21               PT Long Term Goals - 04/05/21 1615       PT LONG TERM GOAL #1   Title Pt will amb. 250' with large- based quad cane with CGA.    Time 7    Period Weeks    Status New    Target Date 05/25/21      PT LONG TERM GOAL #2   Title Pt will negotiate steps with 1 hand rail with SBA.    Time 7    Target Date 05/25/21      PT LONG TERM GOAL #3   Title Improve Berg balance score by at least 8 points to demo improvement in balance and to reduce fall risk.    Time 7    Period Weeks    Status New    Target Date 05/25/21      PT LONG TERM GOAL #4   Title Pt will stand for at least 5" without UE support  and reach at least 10" outside BOS independently for increased independence with ADL's.    Time 7    Period Weeks    Status New    Target Date 05/25/21       PT LONG TERM GOAL #5   Title Negotiate ramp and curb with LBQC with CGA.    Time 7    Period Weeks    Status New    Target Date 05/25/21                   Plan - 04/18/21 1401     Clinical Impression Statement Patient's vitals WNL today. Continued gait training and NMR activities in tall kneeling working on improved weight shift/stance on LLE. Most challenge noted with reaching activity in tall kneeling and continued challenge with heel slides due to L hamstring weakness. Will continue to progress toward all LTGs.    Personal Factors and Comorbidities Behavior Pattern;Comorbidity 2;Transportation;Profession    Comorbidities s/p R PCA CVA with Lt hemiparesis, polysubstance abuse, migraines, obesity, h/o Lt sprained ankle    Examination-Activity Limitations Bathing;Locomotion Level;Transfers;Bend;Caring for Others;Dressing;Lift;Stand;Stairs;Squat;Reach Overhead    Examination-Participation Restrictions Cleaning;Community Activity;Driving;Interpersonal Relationship;Laundry;Yard Work;Shop;Occupation;Meal Prep    Stability/Clinical Decision Making Evolving/Moderate complexity    Rehab Potential Good    PT Frequency 2x / week    PT Duration Other (comment)   7 weeks  (15 visit limit)   PT Treatment/Interventions ADLs/Self Care Home Management;DME Instruction;Gait training;Stair training;Therapeutic activities;Therapeutic exercise;Balance training;Neuromuscular re-education;Patient/family education;Passive range of motion;Visual/perceptual remediation/compensation    PT Next Visit Plan monitor BP. I believe patient will benefit from bioness for L Ant Tib and L Hamstring. Set up and trial with gait. Review and add to HEP for LLE. continue heel slides. Potential Berg Balance. gait training. reaching/weight shift activities.    Consulted and Agree with Plan of Care Patient             Patient will benefit from skilled therapeutic intervention in order to improve the following  deficits and impairments:  Abnormal gait, Decreased activity tolerance, Decreased balance, Decreased safety awareness, Decreased coordination, Decreased strength, Impaired tone, Impaired vision/preception, Impaired UE functional use  Visit Diagnosis: Unsteadiness on feet  Other abnormalities of gait and mobility  Muscle weakness (generalized)  Hemiplegia and hemiparesis following cerebral infarction affecting left non-dominant side [facility])     Problem List Patient Active Problem List   Diagnosis Date Noted   Sprain of left ankle    Sleep disturbance    Hyperglycemia    Hypoalbuminemia due to protein-calorie malnutrition (HCC)    Dyslipidemia    Hemiparesis affecting left side as late effect of stroke (HCC)    Lethargy    Cerebrovascular accident (CVA) due to occlusion of right posterior cerebral artery (HCC) 02/22/2021   Cerebrovascular accident (CVA) (HCC)    Right carotid artery occlusion 02/14/2021    Tempie Donning, PT, DPT 04/18/2021, 2:03 PM  [facility] [address] [place], [place], [address] Phone: [phone]   Fax:  [phone]  Name: David Craig MRN: [id number] Date of Birth: 04/16/1986

## 2021-04-18 NOTE — Patient Instructions (Signed)
Write 5 rhyming sentences to practice your over-articulation

## 2021-04-20 ENCOUNTER — Ambulatory Visit: Payer: BC Managed Care – PPO | Admitting: Occupational Therapy

## 2021-04-20 ENCOUNTER — Encounter: Payer: Self-pay | Admitting: Occupational Therapy

## 2021-04-20 ENCOUNTER — Ambulatory Visit: Payer: BC Managed Care – PPO | Admitting: Physical Therapy

## 2021-04-20 ENCOUNTER — Other Ambulatory Visit: Payer: Self-pay

## 2021-04-20 ENCOUNTER — Ambulatory Visit: Payer: BC Managed Care – PPO

## 2021-04-20 DIAGNOSIS — R2681 Unsteadiness on feet: Secondary | ICD-10-CM | POA: Diagnosis not present

## 2021-04-20 DIAGNOSIS — R41840 Attention and concentration deficit: Secondary | ICD-10-CM

## 2021-04-20 DIAGNOSIS — M6281 Muscle weakness (generalized): Secondary | ICD-10-CM

## 2021-04-20 DIAGNOSIS — I69354 Hemiplegia and hemiparesis following cerebral infarction affecting left non-dominant side: Secondary | ICD-10-CM | POA: Diagnosis not present

## 2021-04-20 DIAGNOSIS — I69854 Hemiplegia and hemiparesis following other cerebrovascular disease affecting left non-dominant side: Secondary | ICD-10-CM

## 2021-04-20 DIAGNOSIS — M25512 Pain in left shoulder: Secondary | ICD-10-CM | POA: Diagnosis not present

## 2021-04-20 DIAGNOSIS — R471 Dysarthria and anarthria: Secondary | ICD-10-CM | POA: Diagnosis not present

## 2021-04-20 DIAGNOSIS — R29818 Other symptoms and signs involving the nervous system: Secondary | ICD-10-CM | POA: Diagnosis not present

## 2021-04-20 DIAGNOSIS — R278 Other lack of coordination: Secondary | ICD-10-CM | POA: Diagnosis not present

## 2021-04-20 DIAGNOSIS — R2689 Other abnormalities of gait and mobility: Secondary | ICD-10-CM | POA: Diagnosis not present

## 2021-04-20 DIAGNOSIS — I63531 Cerebral infarction due to unspecified occlusion or stenosis of right posterior cerebral artery: Secondary | ICD-10-CM | POA: Diagnosis not present

## 2021-04-20 DIAGNOSIS — R41841 Cognitive communication deficit: Secondary | ICD-10-CM

## 2021-04-20 NOTE — Patient Instructions (Signed)
° °  Self Passive Range of Motion  ° °While lying down, clasp hands together and reach towards headboard for a stretch for your shoulder. Repeat 10 times and do 2-3 times a day. ° °While lying down, clasp hands together and reach side to side for a stretch for your shoulder. Repeat 10 times and do 2-3 times a day. ° °While lying down, clasp hands together and reach up towards the ceiling for a stretch for your shoulder. Repeat 10 times and do 2-3 times a day.  °

## 2021-04-20 NOTE — Therapy (Addendum)
[facility] [address] [place], [place], [address] Phone: [phone]   Fax:  [phone]  Occupational Therapy Treatment  Patient Details  Name: David Craig MRN: [id number] Date of Birth: [birth year] Referring Provider (OT): Marissa Nestle   Encounter Date: 04/20/2021   OT End of Session - 04/20/21 1234     Visit Number 4    Number of Visits 15    Date for OT Re-Evaluation 05/30/21    Authorization Type BCBS    Authorization Time Period VL: 30 (PT,OT)    Authorization - Visit Number 3    Authorization - Number of Visits 15    OT Start Time 1232    OT Stop Time 1315    OT Time Calculation (min) 43 min    Activity Tolerance Patient tolerated treatment well    Behavior During Therapy [facility] for tasks assessed/performed             History reviewed. No pertinent past medical history.  Past Surgical History:  Procedure Laterality Date   BUBBLE STUDY  02/21/2021   Procedure: BUBBLE STUDY;  Surgeon: Chilton Si, MD;  Location: [facility] ENDOSCOPY;  Service: Cardiovascular;;   IR ANGIO INTRA EXTRACRAN SEL COM CAROTID INNOMINATE UNI L MOD SED  02/15/2021   IR ANGIO VERTEBRAL SEL VERTEBRAL BILAT MOD SED  02/15/2021   IR CT HEAD LTD  02/15/2021   IR PERCUTANEOUS ART THROMBECTOMY/INFUSION INTRACRANIAL INC DIAG ANGIO  02/15/2021   IR US GUIDE VASC ACCESS RIGHT  02/15/2021   RADIOLOGY WITH ANESTHESIA N/A 02/14/2021   Procedure: IR WITH ANESTHESIA;  Surgeon: Julieanne Cotton, MD;  Location: [facility] OR;  Service: Radiology;  Laterality: N/A;   TEE WITHOUT CARDIOVERSION N/A 02/21/2021   Procedure: TRANSESOPHAGEAL ECHOCARDIOGRAM (TEE);  Surgeon: Chilton Si, MD;  Location: [facility] ENDOSCOPY;  Service: Cardiovascular;  Laterality: N/A;    There were no vitals filed for this visit.   Subjective Assessment - 04/20/21 1233     Subjective  "ive been walking around the house like i shouldn't"    Pertinent History PMH: polysubstance  abuse/tobacco, migrane headaches and obesity    Limitations Fall Risk, Impulsive, L hemi    Patient Stated Goals "get back to normal self" and to "be able to walk to bathroom and pee and pick up my kids".    Currently in Pain? No/denies    Pain Score 0-No pain             Self PROM - see pt instructions              OT Treatments/Exercises (OP) - 04/20/21 0001       ADLs   UB Dressing reports completing UB dressing with assistance for zippers and buttons only    LB Dressing needs assistance with LLE threading. needs assistance for socks, shoes and AFO    Bathing reports still needing assistance for bathing - getting to feet to wash, drying self and reaching back.      Neurological Re-education Exercises   Other Exercises 1 targeting isolated movements with shoulder, bicep and for very small movements. Pt has difficulty with tapping into isolated movements and muscles without over activating LUE and difficulty with managing breath and control. Pt will hold breath with trying to do movements.      Modalities   Modalities Geologist, engineering Location Wrist LUE   worked on encouraging neuroplasticity with active wrist extension/hand opening with  run of [employer] Action extension / digit extension   difficulty noted with tapping into appropriate muscles for movement.   Electrical Stimulation Parameters 10 minutes x NMES x small muscle x 29 intensity    Electrical Stimulation Goals Neuromuscular facilitation;Strength                    OT Education - 04/20/21 1241     Education Details self PROM supine    Person(s) Educated Patient    Methods Explanation;Demonstration    Comprehension Verbalized understanding;Need further instruction;Returned demonstration              OT Short Term Goals - 04/10/21 1535       OT SHORT TERM GOAL #1   Title Pt will be independent with  initial HEP    Time 4    Period Weeks    Status On-going    Target Date 04/25/21      OT SHORT TERM GOAL #2   Title Pt will report pain no greater than 7/10 with HEP    Baseline 8/10 with shoulder flexion/abduction    Time 4    Period Weeks    Status On-going      OT SHORT TERM GOAL #3   Title Pt will verbalize and demonstrate understanding of visual scanning startegies for visual field cut.    Baseline left visual field cut    Time 4    Period Weeks    Status On-going      OT SHORT TERM GOAL #4   Title Pt will perform environmental scanning with 90% accuracy or greater    Time 4    Period Weeks    Status On-going               OT Long Term Goals - 04/10/21 1538       OT LONG TERM GOAL #1   Title Pt will be independent with updated HEP    Time 9    Period Weeks    Status On-going      OT LONG TERM GOAL #2   Title Pt will perform table top scanning with 95% accuracy or greater with use of visual scanning strategies.    Baseline 89% accuracy with 88's    Time 9    Period Weeks    Status On-going      OT LONG TERM GOAL #3   Title Pt will demonstrate ability to grasp and release cylindrical object and/or 1 inch block with LUE.    Baseline trace volitional composite flexion    Time 9    Period Weeks    Status On-going      OT LONG TERM GOAL #4   Title Pt will demonstrate active LUE movement to reach to low level surface for progression to reaching and obtaining objects    Baseline no active flexion    Time 9    Period Weeks    Status On-going      OT LONG TERM GOAL #5   Title PT will demonsrate using LUE as active and functional stabilizer for 20% or greater of ADLs and IADLs.    Baseline 0%    Time 9    Period Weeks    Status On-going                   Plan - 04/20/21 1247     Clinical Impression Statement Pt motivated for returned function in LUE but  over compensating with overactivation of muscles in LUE shoulder and arm. Continue  progressing towards goals.    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;Decreased knowledge of use of DME;Vision;Sensation;Flexibility;Coordination;IADL;ROM;UE functional use;Tone;Pain;Strength;GMC;Dexterity    Cognitive Skills Attention;Safety Awareness    Rehab Potential Good    Clinical Decision Making Limited treatment options, no task modification necessary    Comorbidities Affecting Occupational Performance: None    Modification or Assistance to Complete Evaluation  No modification of tasks or assist necessary to complete eval    OT Frequency 2x / week    OT Duration Other (comment)   15 visits over 9 weeks d/t any scheduling conflicts   OT Treatment/Interventions Self-care/ADL training;Aquatic Therapy;Electrical Stimulation;Energy conservation;Manual Therapy;Patient/family education;Visual/perceptual remediation/compensation;Passive range of motion;Neuromuscular education;Building services engineer;Therapeutic exercise;Cognitive remediation/compensation;Moist Heat;Fluidtherapy;DME and/or AE instruction;Splinting;Therapeutic activities;Ultrasound    Plan visual strategies, HEP for self PROM for LUE, Neuroreed of LUE    Consulted and Agree with Plan of Care Patient             Patient will benefit from skilled therapeutic intervention in order to improve the following deficits and impairments:   Body Structure / Function / Physical Skills: ADL, Decreased knowledge of use of DME, Vision, Sensation, Flexibility, Coordination, IADL, ROM, UE functional use, Tone, Pain, Strength, GMC, Dexterity Cognitive Skills: Attention, Safety Awareness     Visit Diagnosis: Other lack of coordination  Muscle weakness (generalized)  Acute pain of left  shoulder  Attention and concentration deficit  Hemiplegia and hemiparesis following other cerebrovascular disease affecting left non-dominant side (HCC)  Unsteadiness on feet    Problem List Patient Active Problem List   Diagnosis Date Noted   Sprain of left ankle    Sleep disturbance    Hyperglycemia    Hypoalbuminemia due to protein-calorie malnutrition (HCC)    Dyslipidemia    Hemiparesis affecting left side as late effect of stroke (HCC)    Lethargy    Cerebrovascular accident (CVA) due to occlusion of right posterior cerebral artery (HCC) 02/22/2021   Cerebrovascular accident (CVA) (HCC)    Right carotid artery occlusion 02/14/2021    Junious Dresser, OT/L 04/20/2021, 2:25 PM  [facility] [address] [place], [place], [address] Phone: [phone]   Fax:  [phone]  Name: David Craig MRN: [id number] Date of Birth: 09-Aug-1985

## 2021-04-21 DIAGNOSIS — I63531 Cerebral infarction due to unspecified occlusion or stenosis of right posterior cerebral artery: Secondary | ICD-10-CM | POA: Diagnosis not present

## 2021-04-22 NOTE — Therapy (Signed)
[facility] [address] [place], [place], [address] Phone: [phone]   Fax:  [phone]  Physical Therapy Treatment  Patient Details  Name: David Craig MRN: [id number] Date of Birth: 10-23-1985 Referring Provider (PT): Delle Reining, [place]   Encounter Date: 04/20/2021   PT End of Session - 04/22/21 1759     Visit Number 5    Number of Visits 15   2x/week x 7 weeks + eval = 15 visits   Authorization Type BCBS    Authorization - Visit Number 4    Authorization - Number of Visits 15    PT Start Time 1450    PT Stop Time 1535    PT Time Calculation (min) 45 min    Equipment Utilized During Treatment Other (comment)   Bioness   Activity Tolerance Patient tolerated treatment well    Behavior During Therapy [facility] for tasks assessed/performed   Decreased safety awareness noted            No past medical history on file.  Past Surgical History:  Procedure Laterality Date   BUBBLE STUDY  02/21/2021   Procedure: BUBBLE STUDY;  Surgeon: Chilton Si, MD;  Location: [facility] ENDOSCOPY;  Service: Cardiovascular;;   IR ANGIO INTRA EXTRACRAN SEL COM CAROTID INNOMINATE UNI L MOD SED  02/15/2021   IR ANGIO VERTEBRAL SEL VERTEBRAL BILAT MOD SED  02/15/2021   IR CT HEAD LTD  02/15/2021   IR PERCUTANEOUS ART THROMBECTOMY/INFUSION INTRACRANIAL INC DIAG ANGIO  02/15/2021   IR US GUIDE VASC ACCESS RIGHT  02/15/2021   RADIOLOGY WITH ANESTHESIA N/A 02/14/2021   Procedure: IR WITH ANESTHESIA;  Surgeon: Julieanne Cotton, MD;  Location: [facility] OR;  Service: Radiology;  Laterality: N/A;   TEE WITHOUT CARDIOVERSION N/A 02/21/2021   Procedure: TRANSESOPHAGEAL ECHOCARDIOGRAM (TEE);  Surgeon: Chilton Si, MD;  Location: [facility] ENDOSCOPY;  Service: Cardiovascular;  Laterality: N/A;    There were no vitals filed for this visit.    04/20/21 1456  Symptoms/Limitations  Subjective Would like to try Bioness on Hamstring.  No falls.  Pertinent  History Rt PCA CVA on 02-14-21; h/o polysubstance abuse (cocaine, marijuana, oxycodone), h/o migraines, h/o Lt ankle sprain  Diagnostic tests CT and MRI  -  revealed Rt PCA infarcts  Patient Stated Goals "Doing what I used to do" - take care of my kids (ages 82 1/2 and 1 1/2)  Pain Assessment  Currently in Pain? No/denies      [facility] Adult PT Treatment/Exercise - 04/22/21 1749       Transfers   Transfers Sit to Stand;Stand to Sit;Stand Pivot Transfers    Sit to Stand 5: Supervision    Sit to Stand Details (indicate cue type and reason) decreased WB through LLE when performing sit > stand    Stand to Sit 5: Supervision    Stand Pivot Transfers 4: Min assist    Stand Pivot Transfer Details (indicate cue type and reason) with AFO; decreased stance on LLE when pivoting      Ambulation/Gait   Ambulation/Gait Yes    Ambulation/Gait Assistance 3: Mod assist    Ambulation/Gait Assistance Details without AFO but with Bioness in gait mode and UE on RW.  Focused on slowing down gait and therapist providing cues to maintain safer distance to RW through more upright trunk posture and use of more upright posture during weight shift forwards to R stance phase and to increase terminal hip extension on L during terminal stance >> hip and  knee flexion during L swing phase to improve advancement and clearance without compensatory use of lateral whip or trunk flexion.  Required assistance for heel strike to release DF stimulation.  No evidence of genu recurvatum in L stance with activation of hamstring    Ambulation Distance (Feet) 230 Feet    Assistive device Rolling walker    Gait Pattern Step-through pattern;Decreased stride length;Decreased hip/knee flexion - left;Decreased dorsiflexion - left;Trunk flexed;Poor foot clearance - left    Ambulation Surface Level;Indoor      Therapeutic Activites    Therapeutic Activities Other Therapeutic Activities    Other Therapeutic Activities Reviewed purpose and use of  Bioness functional electrical stimulation.  Screened pt for any precautions or contraindications; none identified.      Neuro Re-ed    Neuro Re-ed Details  With Bioness in gait mode performed LLE swing phase training at stairs: RUE support on rail and feet in staggered position.  Cued and facilitated anterior/diagonal weight shift to RLE stance and initiation of L swing phase through more upright posture and pelvic rotation to increase activation of hip flexion with knee flexion to advance to first step.  When stepping back with LLE, focused on full LLE extension to increase stretch of hip flexor mm; performed with therapist min-mod A but demonstrated improved activation of hip flexion with blocked practice      Modalities   Modalities Electrical Stimulation      Electrical Stimulation   Electrical Stimulation Location Bioness functional e-stim to L anterior tib and L hamstring    Electrical Stimulation Action open and closed chain ankle DF and knee flexion, hip extension during gait    Electrical Stimulation Parameters see Tablet 1; quick fit lower electrodes    Electrical Stimulation Goals Strength;Neuromuscular facilitation                     PT Education - 04/22/21 1747     Education Details Bioness training    Person(s) Educated Patient    Methods Explanation    Comprehension Verbalized understanding              PT Short Term Goals - 04/05/21 1612       PT SHORT TERM GOAL #1   Title Independent in HEP for LLE strengthening and stretching.    Time 3    Period Weeks    Status New    Target Date 04/27/21      PT SHORT TERM GOAL #2   Title Pt will participate in assessment of Berg balance test.    Time 3    Period Weeks    Status New    Target Date 04/27/21      PT SHORT TERM GOAL #3   Title Pt will amb. with RW 350' with SBA on flat, even surface.    Time 3    Period Weeks    Status New    Target Date 04/27/21      PT SHORT TERM GOAL #4   Title Pt  will transfer wheelchair to mat independently without cues to lock brakes.    Baseline SBA but needed assistance with locking brakes    Time 3    Period Weeks    Status New    Target Date 04/27/21               PT Long Term Goals - 04/05/21 1615       PT LONG TERM GOAL #1   Title Pt  will amb. 250' with large- based quad cane with CGA.    Time 7    Period Weeks    Status New    Target Date 05/25/21      PT LONG TERM GOAL #2   Title Pt will negotiate steps with 1 hand rail with SBA.    Time 7    Target Date 05/25/21      PT LONG TERM GOAL #3   Title Improve Berg balance score by at least 8 points to demo improvement in balance and to reduce fall risk.    Time 7    Period Weeks    Status New    Target Date 05/25/21      PT LONG TERM GOAL #4   Title Pt will stand for at least 5" without UE support and reach at least 10" outside BOS independently for increased independence with ADL's.    Time 7    Period Weeks    Status New    Target Date 05/25/21      PT LONG TERM GOAL #5   Title Negotiate ramp and curb with LBQC with CGA.    Time 7    Period Weeks    Status New    Target Date 05/25/21                   Plan - 04/22/21 1800     Clinical Impression Statement Initiated NMR and gait training with Bioness upper and lower cuff today.  Pt responded well with good activation of ankle DF and hamstring mm groups.  Pt demonstrated significant improvement in gait sequencing in one session.  Will continue to utilize for LLE NMR and gait training.    Personal Factors and Comorbidities Behavior Pattern;Comorbidity 2;Transportation;Profession    Comorbidities s/p R PCA CVA with Lt hemiparesis, polysubstance abuse, migraines, obesity, h/o Lt sprained ankle    Examination-Activity Limitations Bathing;Locomotion Level;Transfers;Bend;Caring for Others;Dressing;Lift;Stand;Stairs;Squat;Reach Overhead    Examination-Participation Restrictions Cleaning;Community  Activity;Driving;Interpersonal Relationship;Laundry;Yard Work;Shop;Occupation;Meal Prep    Stability/Clinical Decision Making Evolving/Moderate complexity    Rehab Potential Good    PT Frequency 2x / week    PT Duration Other (comment)   7 weeks  (15 visit limit)   PT Treatment/Interventions ADLs/Self Care Home Management;DME Instruction;Gait training;Stair training;Therapeutic activities;Therapeutic exercise;Balance training;Neuromuscular re-education;Patient/family education;Passive range of motion;Visual/perceptual remediation/compensation    PT Next Visit Plan Bioness - Tablet 1, quick fit electrodes, R upper cuff for L hamstring.  Gait training - decrease reliance on RW; work on initiation of L swing phase and increased L stance phase/SLS.  Bioness in training mode for hamstring exercises.  Review and add to HEP for LLE. continue heel slides. Potential Berg Balance. reaching/weight shift activities.    Consulted and Agree with Plan of Care Patient             Patient will benefit from skilled therapeutic intervention in order to improve the following deficits and impairments:  Abnormal gait, Decreased activity tolerance, Decreased balance, Decreased safety awareness, Decreased coordination, Decreased strength, Impaired tone, Impaired vision/preception, Impaired UE functional use  Visit Diagnosis: Muscle weakness (generalized)  Hemiplegia and hemiparesis following other cerebrovascular disease affecting left non-dominant side (HCC)  Unsteadiness on feet  Other abnormalities of gait and mobility     Problem List Patient Active Problem List   Diagnosis Date Noted   Sprain of left ankle    Sleep disturbance    Hyperglycemia    Hypoalbuminemia due to protein-calorie malnutrition (HCC)  Dyslipidemia    Hemiparesis affecting left side as late effect of stroke (HCC)    Lethargy    Cerebrovascular accident (CVA) due to occlusion of right posterior cerebral artery (HCC) 02/22/2021    Cerebrovascular accident (CVA) [facility])    Right carotid artery occlusion 02/14/2021    Dierdre Highman, PT, DPT 04/22/21    6:04 PM    [facility] [address] [place], [place], [address] Phone: [phone]   Fax:  [phone]  Name: David Craig MRN: [id number] Date of Birth: [birth year]

## 2021-04-23 NOTE — Therapy (Signed)
[facility] [address] [place], [place], [address] Phone: [phone]   Fax:  [phone]  Speech Language Pathology Treatment  Patient Details  Name: David Craig MRN: [id number] Date of Birth: Mar 17, 1986 Referring Provider (SLP): David Craig, [place]   Encounter Date: 04/20/2021   End of Session - 04/23/21 0814     Visit Number 4    Number of Visits 17    Date for SLP Re-Evaluation 05/25/21    Authorization Type BCBS    SLP Start Time 1404    SLP Stop Time  1445    SLP Time Calculation (min) 41 min    Activity Tolerance Patient tolerated treatment well             History reviewed. No pertinent past medical history.  Past Surgical History:  Procedure Laterality Date   BUBBLE STUDY  02/21/2021   Procedure: BUBBLE STUDY;  Surgeon: David Si, MD;  Location: [facility] ENDOSCOPY;  Service: Cardiovascular;;   IR ANGIO INTRA EXTRACRAN SEL COM CAROTID INNOMINATE UNI L MOD SED  02/15/2021   IR ANGIO VERTEBRAL SEL VERTEBRAL BILAT MOD SED  02/15/2021   IR CT HEAD LTD  02/15/2021   IR PERCUTANEOUS ART THROMBECTOMY/INFUSION INTRACRANIAL INC DIAG ANGIO  02/15/2021   IR US GUIDE VASC ACCESS RIGHT  02/15/2021   RADIOLOGY WITH ANESTHESIA N/A 02/14/2021   Procedure: IR WITH ANESTHESIA;  Surgeon: David Cotton, MD;  Location: [facility] OR;  Service: Radiology;  Laterality: N/A;   TEE WITHOUT CARDIOVERSION N/A 02/21/2021   Procedure: TRANSESOPHAGEAL ECHOCARDIOGRAM (TEE);  Surgeon: David Si, MD;  Location: [facility] ENDOSCOPY;  Service: Cardiovascular;  Laterality: N/A;    There were no vitals filed for this visit.   Subjective Assessment - 04/23/21 0812     Subjective Pt in lobby waiting for SLP after OT.    Currently in Pain? No/denies                   ADULT SLP TREATMENT - 04/23/21 0001       General Information   Behavior/Cognition Alert;Cooperative;Pleasant mood      Cognitive-Linquistic Treatment    Treatment focused on Cognition;Dysarthria    Skilled Treatment Pt did not bring his list of tongue twisters so SLP printed him another one. He did not do his homework of making 5 rhyming sentences. He did not appear bothered by either of these things. Rare min A necessary for using compensation of overarticulating with HEP. 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 Pt stated he knows where his list of phrases (tongue twisters) is. Pt generated 5 sentences with one-two different rhyming patterns in each sentence.      Assessment / Recommendations / Plan   Plan Continue with current plan of care      Progression Toward Goals   Progression toward goals Progressing toward goals   decr'd compliance               SLP Short Term Goals - 04/20/21 1411       SLP SHORT TERM GOAL #1   Title Pt will complete HEP with rare min A over 3 sessions    Baseline 04-18-21, 04-20-21    Time 3    Period Weeks    Status On-going      SLP SHORT TERM GOAL #2   Title Pt will comprehend and sort medications in role play medication management tasks with 90% accuracy    Time 3  Period Weeks    Status On-going      SLP SHORT TERM GOAL #3   Title Pt will comprehend and complete finanical management tasks with 80% accuracy over 2 sesssions    Time 3    Period Weeks    Status On-going      SLP SHORT TERM GOAL #4   Title Pt will demonstrate dysarthria compensations to be 100% intelligble in 10 minute conversations over 2 sessions    Time 3    Period Weeks    Status On-going      SLP SHORT TERM GOAL #5   Title Pt will complete cognitive communication PROM in first few sessions    Status Achieved              SLP Long Term Goals - 04/18/21 1137       SLP LONG TERM GOAL #1   Title Pt will verbalize and demonstrate memory/attention compensations to aid daily functioning given rare min A over 2 sessions    Time 7    Period Weeks    Status On-going       SLP LONG TERM GOAL #2   Title Pt will independently manage medications with rare min A from wife over 4 sessions    Time 7    Period Weeks    Status On-going      SLP LONG TERM GOAL #3   Title Pt will demonstrate dysarthria compensations to be 100% intelligble in 20+ minute conversations over 2 sessions    Time 7    Period Weeks    Status On-going      SLP LONG TERM GOAL #4   Title Pt will report improved cognition and communication via PROM by 2 points at last ST session    Time 7    Period Weeks    Status On-going              Plan - 04/23/21 0815     Clinical Impression Statement "David Craig" was referred for OPST intervention secondary to CVA in September 2022. Some improvements reported for cognition and speech, but pt reported he has yet to return to baseline. Dysarthria compensations and HEP reviewed today. Intermittent min A required for over-articulation for sentence level tasks. Increased labial ROM and coordination exhibited during over-articulation. SLP targeted alternating attention, which did appear to impact accuracy and attention. Pt benefited from double checking. Pt desires to return to baseline. Pt would benefit from skilled ST targeting cognitive linguistic skills and dysarthria to maximize return to PLOF.    Speech Therapy Frequency 2x / week    Duration 8 weeks    Treatment/Interventions Compensatory strategies;Cueing hierarchy;Functional tasks;Cognitive reorganization;Multimodal communcation approach;Language facilitation;Compensatory techniques;Internal/external aids;SLP instruction and feedback    Potential to Achieve Goals Good    SLP Home Exercise Plan provided    Consulted and Agree with Plan of Care Patient             Patient will benefit from skilled therapeutic intervention in order to improve the following deficits and impairments:   Cognitive communication deficit  Dysarthria and anarthria    Problem List Patient Active Problem List    Diagnosis Date Noted   Sprain of left ankle    Sleep disturbance    Hyperglycemia    Hypoalbuminemia due to protein-calorie malnutrition (HCC)    Dyslipidemia    Hemiparesis affecting left side as late effect of stroke (HCC)    Lethargy    Cerebrovascular accident (CVA) due to  occlusion of right posterior cerebral artery (HCC) 02/22/2021   Cerebrovascular accident (CVA) [facility])    Right carotid artery occlusion 02/14/2021    [facility] ,MS, CCC-SLP  04/23/2021, 8:16 AM  [facility] [address] [place], [place], [address] Phone: [phone]   Fax:  [phone]   Name: David Craig MRN: [id number] Date of Birth: 1985/11/17

## 2021-04-24 ENCOUNTER — Encounter: Payer: Self-pay | Admitting: Occupational Therapy

## 2021-04-24 ENCOUNTER — Ambulatory Visit: Payer: BC Managed Care – PPO | Admitting: Physical Therapy

## 2021-04-24 ENCOUNTER — Ambulatory Visit: Payer: BC Managed Care – PPO | Admitting: Occupational Therapy

## 2021-04-24 ENCOUNTER — Other Ambulatory Visit: Payer: Self-pay

## 2021-04-24 ENCOUNTER — Encounter: Payer: Self-pay | Admitting: Physical Therapy

## 2021-04-24 ENCOUNTER — Ambulatory Visit: Payer: BC Managed Care – PPO

## 2021-04-24 DIAGNOSIS — M6281 Muscle weakness (generalized): Secondary | ICD-10-CM | POA: Diagnosis not present

## 2021-04-24 DIAGNOSIS — R2689 Other abnormalities of gait and mobility: Secondary | ICD-10-CM | POA: Diagnosis not present

## 2021-04-24 DIAGNOSIS — R2681 Unsteadiness on feet: Secondary | ICD-10-CM | POA: Diagnosis not present

## 2021-04-24 DIAGNOSIS — R41841 Cognitive communication deficit: Secondary | ICD-10-CM | POA: Diagnosis not present

## 2021-04-24 DIAGNOSIS — M25512 Pain in left shoulder: Secondary | ICD-10-CM | POA: Diagnosis not present

## 2021-04-24 DIAGNOSIS — R41840 Attention and concentration deficit: Secondary | ICD-10-CM | POA: Diagnosis not present

## 2021-04-24 DIAGNOSIS — R471 Dysarthria and anarthria: Secondary | ICD-10-CM | POA: Diagnosis not present

## 2021-04-24 DIAGNOSIS — I69854 Hemiplegia and hemiparesis following other cerebrovascular disease affecting left non-dominant side: Secondary | ICD-10-CM | POA: Diagnosis not present

## 2021-04-24 DIAGNOSIS — I69354 Hemiplegia and hemiparesis following cerebral infarction affecting left non-dominant side: Secondary | ICD-10-CM

## 2021-04-24 DIAGNOSIS — I63531 Cerebral infarction due to unspecified occlusion or stenosis of right posterior cerebral artery: Secondary | ICD-10-CM | POA: Diagnosis not present

## 2021-04-24 DIAGNOSIS — R29818 Other symptoms and signs involving the nervous system: Secondary | ICD-10-CM | POA: Diagnosis not present

## 2021-04-24 DIAGNOSIS — R278 Other lack of coordination: Secondary | ICD-10-CM | POA: Diagnosis not present

## 2021-04-24 NOTE — Therapy (Signed)
[facility] [address] [place], [place], [address] Phone: [phone]   Fax:  [phone]  Speech Language Pathology Treatment  Patient Details  Name: Emanuel Dowson MRN: [id number] Date of Birth: [birth year] Referring Provider (SLP): Jacquelynn Cree, [place]   Encounter Date: 04/24/2021   End of Session - 04/24/21 1557     Visit Number 5    Number of Visits 17    Date for SLP Re-Evaluation 05/25/21    Authorization Type BCBS    SLP Start Time 1532    SLP Stop Time  1615    SLP Time Calculation (min) 43 min    Activity Tolerance Patient tolerated treatment well             History reviewed. No pertinent past medical history.  Past Surgical History:  Procedure Laterality Date   BUBBLE STUDY  02/21/2021   Procedure: BUBBLE STUDY;  Surgeon: Chilton Si, MD;  Location: [facility] ENDOSCOPY;  Service: Cardiovascular;;   IR ANGIO INTRA EXTRACRAN SEL COM CAROTID INNOMINATE UNI L MOD SED  02/15/2021   IR ANGIO VERTEBRAL SEL VERTEBRAL BILAT MOD SED  02/15/2021   IR CT HEAD LTD  02/15/2021   IR PERCUTANEOUS ART THROMBECTOMY/INFUSION INTRACRANIAL INC DIAG ANGIO  02/15/2021   IR US GUIDE VASC ACCESS RIGHT  02/15/2021   RADIOLOGY WITH ANESTHESIA N/A 02/14/2021   Procedure: IR WITH ANESTHESIA;  Surgeon: Julieanne Cotton, MD;  Location: [facility] OR;  Service: Radiology;  Laterality: N/A;   TEE WITHOUT CARDIOVERSION N/A 02/21/2021   Procedure: TRANSESOPHAGEAL ECHOCARDIOGRAM (TEE);  Surgeon: Chilton Si, MD;  Location: [facility] ENDOSCOPY;  Service: Cardiovascular;  Laterality: N/A;    There were no vitals filed for this visit.   Subjective Assessment - 04/24/21 1533     Subjective "I've been walking around"    Currently in Pain? No/denies                   ADULT SLP TREATMENT - 04/24/21 1531       General Information   Behavior/Cognition Alert;Cooperative;Pleasant mood      Treatment Provided   Treatment provided  Cognitive-Linquistic      Cognitive-Linquistic Treatment   Treatment focused on Cognition;Dysarthria    Skilled Treatment Pt reportedly completing dysarthria HEP with improvements in speech reported and indicated by family. Pt deemed nearly 95% intelligible in conversation. Pt is managing his appointments with increased independence with occasional cues for specific appointment information. SLP educated and trained use of external memory aids, including use of reminders app in phone as pt has been delaying medications even with alarms. Occasional min A provided to aid attention and problem solving for app set up. SLP provided more advanced tongue twisters. SLP provided intermittent min cues to slow rate and over-articulate for targeted task.      Assessment / Recommendations / Plan   Plan Continue with current plan of care      Progression Toward Goals   Progression toward goals Progressing toward goals              SLP Education - 04/24/21 1550     Education Details HEP, tongue twisters    Person(s) Educated Patient    Methods Explanation;Demonstration;Handout    Comprehension Verbalized understanding;Returned demonstration;Need further instruction              SLP Short Term Goals - 04/24/21 1538       SLP SHORT TERM GOAL #1   Title Pt will complete HEP  with rare min A over 3 sessions    Baseline 04-18-21, 04-20-21    Time 2    Period Weeks    Status On-going      SLP SHORT TERM GOAL #2   Title Pt will comprehend and sort medications in role play medication management tasks with 90% accuracy    Time 2    Period Weeks    Status On-going      SLP SHORT TERM GOAL #3   Title Pt will comprehend and complete finanical management tasks with 80% accuracy over 2 sesssions    Time 2    Period Weeks    Status Deferred      SLP SHORT TERM GOAL #4   Title Pt will demonstrate dysarthria compensations to be 100% intelligble in 10 minute conversations over 2 sessions    Time  2    Period Weeks    Status On-going      SLP SHORT TERM GOAL #5   Title Pt will complete cognitive communication PROM in first few sessions    Status Achieved              SLP Long Term Goals - 04/24/21 1542       SLP LONG TERM GOAL #1   Title Pt will verbalize and demonstrate memory/attention compensations to aid daily functioning given rare min A over 2 sessions    Baseline 04-24-21    Time 6    Period Weeks    Status On-going      SLP LONG TERM GOAL #2   Title Pt will independently manage medications with rare min A from wife over 4 sessions    Time 6    Period Weeks    Status On-going      SLP LONG TERM GOAL #3   Title Pt will demonstrate dysarthria compensations to be 100% intelligble in 20+ minute conversations over 2 sessions    Time 6    Period Weeks    Status On-going      SLP LONG TERM GOAL #4   Title Pt will report improved cognition and communication via PROM by 2 points at last ST session    Time 6    Period Weeks    Status On-going              Plan - 04/24/21 1557     Clinical Impression Statement "Cullan" was referred for OPST intervention secondary to CVA in September 2022. Some improvements reported for cognition and speech. Dysarthria compensations and HEP reviewed today. Intermittent min A required for over-articulation and slowed rate for more advanced tongue twisters. SLP assisted with establishment and training of external memory aids. Pt desires to return to baseline. Pt would benefit from skilled ST targeting cognitive linguistic skills and dysarthria to maximize return to PLOF.    Speech Therapy Frequency 2x / week    Duration 8 weeks    Treatment/Interventions Compensatory strategies;Cueing hierarchy;Functional tasks;Cognitive reorganization;Multimodal communcation approach;Language facilitation;Compensatory techniques;Internal/external aids;SLP instruction and feedback    Potential to Achieve Goals Good    SLP Home Exercise Plan  provided    Consulted and Agree with Plan of Care Patient             Patient will benefit from skilled therapeutic intervention in order to improve the following deficits and impairments:   Cognitive communication deficit  Dysarthria and anarthria    Problem List Patient Active Problem List   Diagnosis Date Noted   Sprain of left  ankle    Sleep disturbance    Hyperglycemia    Hypoalbuminemia due to protein-calorie malnutrition (HCC)    Dyslipidemia    Hemiparesis affecting left side as late effect of stroke (HCC)    Lethargy    Cerebrovascular accident (CVA) due to occlusion of right posterior cerebral artery (HCC) 02/22/2021   Cerebrovascular accident (CVA) [facility])    Right carotid artery occlusion 02/14/2021    Janann Colonel, CCC-SLP 04/24/2021, 7:50 PM  [facility] [address] [place], [place], [address] Phone: [phone]   Fax:  [phone]   Name: Kastin Cerda MRN: [id number] Date of Birth: 04-06-1986

## 2021-04-24 NOTE — Therapy (Signed)
[facility] [address] [place], [place], [address] Phone: [phone]   Fax:  [phone]  Physical Therapy Treatment  Patient Details  Name: David Craig MRN: [id number] Date of Birth: 1985/12/22 Referring Provider (PT): Reesa Chew, [place]   Encounter Date: 04/24/2021   PT End of Session - 04/24/21 1511     Visit Number 6    Number of Visits 15   2x/week x 7 weeks + eval = 15 visits   Authorization Type BCBS    Authorization - Visit Number 5    Authorization - Number of Visits 15    PT Start Time 4496    PT Stop Time 1445    PT Time Calculation (min) 40 min    Equipment Utilized During Treatment Gait belt   Bioness   Activity Tolerance Patient tolerated treatment well    Behavior During Therapy [facility] for tasks assessed/performed   Decreased safety awareness noted            History reviewed. No pertinent past medical history.  Past Surgical History:  Procedure Laterality Date   BUBBLE STUDY  02/21/2021   Procedure: BUBBLE STUDY;  Surgeon: Skeet Latch, MD;  Location: [facility];  Service: Cardiovascular;;   IR ANGIO INTRA EXTRACRAN SEL COM CAROTID INNOMINATE UNI L MOD SED  02/15/2021   IR ANGIO VERTEBRAL SEL VERTEBRAL BILAT MOD SED  02/15/2021   IR CT HEAD LTD  02/15/2021   IR PERCUTANEOUS ART THROMBECTOMY/INFUSION INTRACRANIAL INC DIAG ANGIO  02/15/2021   IR US GUIDE VASC ACCESS RIGHT  02/15/2021   RADIOLOGY WITH ANESTHESIA N/A 02/14/2021   Procedure: IR WITH ANESTHESIA;  Surgeon: Luanne Bras, MD;  Location: [facility];  Service: Radiology;  Laterality: N/A;   TEE WITHOUT CARDIOVERSION N/A 02/21/2021   Procedure: TRANSESOPHAGEAL ECHOCARDIOGRAM (TEE);  Surgeon: Skeet Latch, MD;  Location: [facility];  Service: Cardiovascular;  Laterality: N/A;    There were no vitals filed for this visit.   Subjective Assessment - 04/24/21 1501     Subjective Pt asks about having the Bioness used today -  states he is unable to activate his hamstrings    Pertinent History Rt PCA CVA on 02-14-21; h/o polysubstance abuse (cocaine, marijuana, oxycodone), h/o migraines, h/o Lt ankle sprain    Diagnostic tests CT and MRI  -  revealed Rt PCA infarcts    Patient Stated Goals "Doing what I used to do" - take care of my kids (ages 32 1/2 and 1 1/2)    Currently in Pain? No/denies                               [facility] Adult PT Treatment/Exercise - 04/24/21 1423       Transfers   Transfers Sit to Stand;Stand to Sit    Sit to Stand 4: Min guard;4: Min assist    Stand to Sit 5: Supervision    Number of Reps 10 reps    Comments Rt foot placed on balance bubble for increased LLE weight bearing      Ambulation/Gait   Ambulation/Gait Yes    Ambulation/Gait Assistance 4: Min assist    Ambulation/Gait Assistance Details AFO on LLE; cues to slow down, stand erect, and extend Lt knee prior to stepping with RLE    Ambulation Distance (Feet) 115 Feet    Assistive device Large base quad cane    Gait Pattern Step-through pattern;Decreased stride length;Decreased hip/knee flexion -  left;Decreased dorsiflexion - left;Trunk flexed;Poor foot clearance - left    Ambulation Surface Level;Indoor      Neuro Re-ed    Neuro Re-ed Details  Pt performed tall kneeling on mat with use of Kay bench in front for UE support; attempted to facilitate weight bearing thru LUE  with hand on Kay bench and fingers placed in extension; had pt to reach with his Rt hand over to Lt side to touch PT's hand held up at approx. 125 degrees flexion; pt's LUE extended due to spasticity; pt performed 10 mini squats with UE support on Kay bench; pt moved RLE forward/back 5 reps and then out/in to facilitate LLE weight bearing in tall kneeling position; cues given to maintain erect posture      Knee/Hip Exercises: Stretches   Press photographer Left;2 reps;30 seconds   with use of wedged block in standing - with RUE support      Knee/Hip Exercises: Seated   Hamstring Curl AAROM;Left;1 set;10 reps   pt able to actively flex Lt knee from full extension to approx. 75 degrees; passively flexed Lt knee to approx. 100 degrees in seated position            Pt performed forward/ backward amb. Inside // bars 10' x 2 reps each with SBA; sideways 10' x 2 reps with SBA          PT Short Term Goals - 04/24/21 1512       PT SHORT TERM GOAL #1   Title Independent in HEP for LLE strengthening and stretching.    Time 3    Period Weeks    Status New    Target Date 04/27/21      PT SHORT TERM GOAL #2   Title Pt will participate in assessment of Berg balance test.    Time 3    Period Weeks    Status New    Target Date 04/27/21      PT SHORT TERM GOAL #3   Title Pt will amb. with RW 350' with SBA on flat, even surface.    Baseline met 04-24-21    Time 3    Period Weeks    Status Achieved    Target Date 04/27/21      PT SHORT TERM GOAL #4   Title Pt will transfer wheelchair to mat independently without cues to lock brakes.    Baseline SBA but needed assistance with locking brakes; pt walked into clinic with RW (is not using wheelchair to access clinic) - 04-24-21    Time 3    Period Weeks    Status Deferred    Target Date 04/27/21               PT Long Term Goals - 04/24/21 1521       PT LONG TERM GOAL #1   Title Pt will amb. 250' with large- based quad cane with CGA.    Time 7    Period Weeks    Status New      PT LONG TERM GOAL #2   Title Pt will negotiate steps with 1 hand rail with SBA.    Time 7      PT LONG TERM GOAL #3   Title Improve Berg balance score by at least 8 points to demo improvement in balance and to reduce fall risk.    Time 7    Period Weeks    Status New      PT LONG TERM  GOAL #4   Title Pt will stand for at least 5" without UE support and reach at least 10" outside BOS independently for increased independence with ADL's.    Time 7    Period Weeks    Status New       PT LONG TERM GOAL #5   Title Negotiate ramp and curb with LBQC with CGA.    Time 7    Period Weeks    Status New                   Plan - 04/24/21 1516     Clinical Impression Statement Pt has met STG #3; STG #4 deferred due to pt using RW and not wheelchair to access clinic. Pt able to perform sit to stand with UE support modified independently.  Pt needs cues to decrease speed with amb. in order to facilitate increased weight shift and weight bearing on LLE.  Pt keeps Lt knee slightly flexed in stance phase and does not fully extend Lt knee in stance.  Pt had moderate difficulty performing backwards ambulation inside // bars due to Lt hamstring weakness.  Pt did amb. with use of LBQC 115' for first time today.  Cont with POC.    Personal Factors and Comorbidities Behavior Pattern;Comorbidity 2;Transportation;Profession    Comorbidities s/p R PCA CVA with Lt hemiparesis, polysubstance abuse, migraines, obesity, h/o Lt sprained ankle    Examination-Activity Limitations Bathing;Locomotion Level;Transfers;Bend;Caring for Others;Dressing;Lift;Stand;Stairs;Squat;Reach Overhead    Examination-Participation Restrictions Cleaning;Community Activity;Driving;Interpersonal Relationship;Laundry;Yard Work;Shop;Occupation;Meal Prep    Stability/Clinical Decision Making Evolving/Moderate complexity    Rehab Potential Good    PT Frequency 2x / week    PT Duration Other (comment)   7 weeks  (15 visit limit)   PT Treatment/Interventions ADLs/Self Care Home Management;DME Instruction;Gait training;Stair training;Therapeutic activities;Therapeutic exercise;Balance training;Neuromuscular re-education;Patient/family education;Passive range of motion;Visual/perceptual remediation/compensation    PT Next Visit Plan Please check STG's #1 and 2:  cont with Bioness - Tablet 1, quick fit electrodes, R upper cuff for L hamstring.  Gait training - decrease reliance on RW; work on initiation of L swing phase  and increased L stance phase/SLS.  Bioness in training mode for hamstring exercises.  Review and add to HEP for LLE. continue heel slides. Potential Berg Balance. reaching/weight shift activities.    Consulted and Agree with Plan of Care Patient             Patient will benefit from skilled therapeutic intervention in order to improve the following deficits and impairments:  Abnormal gait, Decreased activity tolerance, Decreased balance, Decreased safety awareness, Decreased coordination, Decreased strength, Impaired tone, Impaired vision/preception, Impaired UE functional use  Visit Diagnosis: Unsteadiness on feet  Other abnormalities of gait and mobility  Hemiplegia and hemiparesis following cerebral infarction affecting left non-dominant side [facility])     Problem List Patient Active Problem List   Diagnosis Date Noted   Sprain of left ankle    Sleep disturbance    Hyperglycemia    Hypoalbuminemia due to protein-calorie malnutrition (HCC)    Dyslipidemia    Hemiparesis affecting left side as late effect of stroke (HCC)    Lethargy    Cerebrovascular accident (CVA) due to occlusion of right posterior cerebral artery ([place]) 02/22/2021   Cerebrovascular accident (CVA) [facility])    Right carotid artery occlusion 02/14/2021    Pasco Marchitto, [place], PT 04/24/2021, 3:26 PM  [facility] [address] [place], [place], [address] Phone: [phone]  Fax:  [phone]  Name: Devarion Mcclanahan MRN: [id number] Date of Birth: 1986-04-01

## 2021-04-24 NOTE — Patient Instructions (Addendum)
Write down any episodes of forgetfulness over the next week  Use reminders to help you remember to take you medicines in a timely manner   Practice tongue twisters twice a day. Keep in mind over-articulation when you are conversing

## 2021-04-24 NOTE — Therapy (Signed)
[facility] [address] [place], [place], [address] Phone: [phone]   Fax:  [phone]  Occupational Therapy Treatment  Patient Details  Name: David Craig MRN: [id number] Date of Birth: 01-Nov-1985 Referring Provider (OT): Marissa Nestle   Encounter Date: 04/24/2021   OT End of Session - 04/24/21 1455     Visit Number 5    Number of Visits 15    Date for OT Re-Evaluation 05/30/21    Authorization Type BCBS    Authorization Time Period VL: 30 (PT,OT)    Authorization - Visit Number 4    Authorization - Number of Visits 15    OT Start Time 1448    OT Stop Time 1530    OT Time Calculation (min) 42 min    Activity Tolerance Patient tolerated treatment well    Behavior During Therapy [facility] for tasks assessed/performed             History reviewed. No pertinent past medical history.  Past Surgical History:  Procedure Laterality Date   BUBBLE STUDY  02/21/2021   Procedure: BUBBLE STUDY;  Surgeon: Chilton Si, MD;  Location: [facility] ENDOSCOPY;  Service: Cardiovascular;;   IR ANGIO INTRA EXTRACRAN SEL COM CAROTID INNOMINATE UNI L MOD SED  02/15/2021   IR ANGIO VERTEBRAL SEL VERTEBRAL BILAT MOD SED  02/15/2021   IR CT HEAD LTD  02/15/2021   IR PERCUTANEOUS ART THROMBECTOMY/INFUSION INTRACRANIAL INC DIAG ANGIO  02/15/2021   IR US GUIDE VASC ACCESS RIGHT  02/15/2021   RADIOLOGY WITH ANESTHESIA N/A 02/14/2021   Procedure: IR WITH ANESTHESIA;  Surgeon: Julieanne Cotton, MD;  [facility];  Service: Radiology;  Laterality: N/A;   TEE WITHOUT CARDIOVERSION N/A 02/21/2021   Procedure: TRANSESOPHAGEAL ECHOCARDIOGRAM (TEE);  Surgeon: Chilton Si, MD;  Location: [facility] ENDOSCOPY;  Service: Cardiovascular;  Laterality: N/A;    There were no vitals filed for this visit.   Subjective Assessment - 04/24/21 1452     Subjective  "they said my hand is swollen"    Pertinent History PMH: polysubstance abuse/tobacco, migrane  headaches and obesity    Limitations Fall Risk, Impulsive, L hemi    Patient Stated Goals "get back to normal self" and to "be able to walk to bathroom and pee and pick up my kids".    Currently in Pain? No/denies    Pain Score 0-No pain              Neuromuscular Reeducation with getting into prone on forearms and in quadruped for forced use of LUE and weight bearing. Pt placed semicircle pegs into pegboard with RUE with mod A for shoulder stabiity in LUE. HemiGlide with LUE for AAROM with min activation in shoulder/scapula for forward reaching and over activation and decreased breath control. 4  ADL demonstrated UB and LB dressing with donning shorts and shirt in clinic. Pt donned shirt with increased time and verbal cue soft technique. Pt was able to don shorts and doff with supervision and verbal cues and instructed on use of reacher for donning shorts. Completed at edge of mat.  Aquatics discussed possibility of scheduling for aquatics therapy with OT for increased balance and control and LUE NMR. Pt verbalized understanding and is going to speak with family re: transportation and appt times.                     OT Short Term Goals - 04/10/21 1535       OT SHORT TERM  GOAL #1   Title Pt will be independent with initial HEP    Time 4    Period Weeks    Status On-going    Target Date 04/25/21      OT SHORT TERM GOAL #2   Title Pt will report pain no greater than 7/10 with HEP    Baseline 8/10 with shoulder flexion/abduction    Time 4    Period Weeks    Status On-going      OT SHORT TERM GOAL #3   Title Pt will verbalize and demonstrate understanding of visual scanning startegies for visual field cut.    Baseline left visual field cut    Time 4    Period Weeks    Status On-going      OT SHORT TERM GOAL #4   Title Pt will perform environmental scanning with 90% accuracy or greater    Time 4    Period Weeks    Status On-going               OT  Long Term Goals - 04/10/21 1538       OT LONG TERM GOAL #1   Title Pt will be independent with updated HEP    Time 9    Period Weeks    Status On-going      OT LONG TERM GOAL #2   Title Pt will perform table top scanning with 95% accuracy or greater with use of visual scanning strategies.    Baseline 89% accuracy with 88's    Time 9    Period Weeks    Status On-going      OT LONG TERM GOAL #3   Title Pt will demonstrate ability to grasp and release cylindrical object and/or 1 inch block with LUE.    Baseline trace volitional composite flexion    Time 9    Period Weeks    Status On-going      OT LONG TERM GOAL #4   Title Pt will demonstrate active LUE movement to reach to low level surface for progression to reaching and obtaining objects    Baseline no active flexion    Time 9    Period Weeks    Status On-going      OT LONG TERM GOAL #5   Title PT will demonsrate using LUE as active and functional stabilizer for 20% or greater of ADLs and IADLs.    Baseline 0%    Time 9    Period Weeks    Status On-going                   Plan - 04/24/21 1558     Clinical Impression Statement Discussed possibility of aquatics with patient. Pt to discuss with family re: appts and transportation.    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;Decreased knowledge of use of DME;Vision;Sensation;Flexibility;Coordination;IADL;ROM;UE functional use;Tone;Pain;Strength;GMC;Dexterity    Cognitive Skills Attention;Safety Awareness    Rehab Potential Good    Clinical Decision Making Limited treatment options, no task modification necessary    Comorbidities Affecting Occupational Performance: None    Modification or Assistance to Complete Evaluation  No  modification of tasks or assist necessary to complete eval    OT Frequency 2x / week    OT Duration Other (comment)  15 visits over 9 weeks d/t any scheduling conflicts   OT Treatment/Interventions Self-care/ADL training;Aquatic Therapy;Electrical Stimulation;Energy conservation;Manual Therapy;Patient/family education;Visual/perceptual remediation/compensation;Passive range of motion;Neuromuscular education;Building services engineer;Therapeutic exercise;Cognitive remediation/compensation;Moist Heat;Fluidtherapy;DME and/or AE instruction;Splinting;Therapeutic activities;Ultrasound    Plan visual strategies, HEP for self PROM for LUE, Neuroreed of LUE    Consulted and Agree with Plan of Care Patient             Patient will benefit from skilled therapeutic intervention in order to improve the following deficits and impairments:   Body Structure / Function / Physical Skills: ADL, Decreased knowledge of use of DME, Vision, Sensation, Flexibility, Coordination, IADL, ROM, UE functional use, Tone, Pain, Strength, GMC, Dexterity Cognitive Skills: Attention, Safety Awareness     Visit Diagnosis: Acute pain of left shoulder  Attention and concentration deficit  Muscle weakness (generalized)  Hemiplegia and hemiparesis following other cerebrovascular disease affecting left non-dominant side (HCC)  Unsteadiness on feet  Other abnormalities of gait and mobility  Other lack of coordination    Problem List Patient Active Problem List   Diagnosis Date Noted   Sprain of left ankle    Sleep disturbance    Hyperglycemia    Hypoalbuminemia due to protein-calorie malnutrition (HCC)    Dyslipidemia    Hemiparesis affecting left side as late effect of stroke (HCC)    Lethargy    Cerebrovascular accident (CVA) due to occlusion of right posterior cerebral artery (HCC) 02/22/2021   Cerebrovascular accident (CVA) (HCC)    Right carotid artery occlusion 02/14/2021    Junious Dresser, OT/L 04/24/2021, 3:59 PM  [facility] [address] [place], [place], [address] Phone: [phone]   Fax:  [phone]  Name: Mackay Hanauer MRN: [id number] Date of Birth: 1985/07/28

## 2021-04-24 NOTE — Patient Instructions (Signed)
   Aquatic Therapy: What to Expect!  Where:  [facility] [address] [place], [place] [address] [phone]           How to Prepare: Please make sure you drink 8 ounces of water about one hour prior to your pool session A caregiver must attend the entire session with the patient (unless your primary therapists feels this is not necessary). The caregiver will be responsible for assisting with dressing as well as any toileting needs.  Please arrive IN YOUR SUIT and a few minutes prior to your appointment - this helps to avoid delays in starting your session. Please make sure to attend to any toileting needs prior to entering the pool Once on the pool deck your therapist will ask you to sign the Patient  Consent and Assignment of Benefits form Your therapist may take your blood pressure prior to, during and after your session if indicated We usually try and create a home exercise program based on activities we do in the pool.  Please be thinking about who might be able to assist you in the pool should you want to participate in an aquatic home exercise program at the time of discharge.  Some patients do not want to or do not have the ability to participate in an aquatic home program - this is not a barrier in any way to you participating in aquatic therapy as part of your current therapy plan!    About the pool: Entering the pool Your therapist will assist you; there are multiple ways to enter including stairs with railings, a walk in ramp, a roll in chair and a mechanical lift. Your therapist will determine the most appropriate way for you. Water temperature is usually between 86-87 degrees There may be other swimmers in the pool at the same time     Contact Info:             Appointments: [facility]         All sessions are 45 minutes   [address]  [address]            Please call the [facility]  if   [place], [place]  [address]           you need to cancel or reschedule an appointment.  [phone]           Suzanne Dilday, PT    Kris Gellert, OTR/L    11/24/19  

## 2021-04-25 ENCOUNTER — Encounter: Payer: Self-pay | Admitting: Speech Pathology

## 2021-04-25 ENCOUNTER — Ambulatory Visit: Payer: BC Managed Care – PPO

## 2021-04-25 ENCOUNTER — Ambulatory Visit: Payer: BC Managed Care – PPO | Admitting: Occupational Therapy

## 2021-04-25 ENCOUNTER — Encounter: Payer: Self-pay | Admitting: Occupational Therapy

## 2021-04-25 ENCOUNTER — Ambulatory Visit: Payer: BC Managed Care – PPO | Admitting: Speech Pathology

## 2021-04-25 DIAGNOSIS — R41841 Cognitive communication deficit: Secondary | ICD-10-CM | POA: Diagnosis not present

## 2021-04-25 DIAGNOSIS — I69854 Hemiplegia and hemiparesis following other cerebrovascular disease affecting left non-dominant side: Secondary | ICD-10-CM

## 2021-04-25 DIAGNOSIS — M6281 Muscle weakness (generalized): Secondary | ICD-10-CM

## 2021-04-25 DIAGNOSIS — R278 Other lack of coordination: Secondary | ICD-10-CM

## 2021-04-25 DIAGNOSIS — R2689 Other abnormalities of gait and mobility: Secondary | ICD-10-CM | POA: Diagnosis not present

## 2021-04-25 DIAGNOSIS — R2681 Unsteadiness on feet: Secondary | ICD-10-CM | POA: Diagnosis not present

## 2021-04-25 DIAGNOSIS — M25512 Pain in left shoulder: Secondary | ICD-10-CM

## 2021-04-25 DIAGNOSIS — I63531 Cerebral infarction due to unspecified occlusion or stenosis of right posterior cerebral artery: Secondary | ICD-10-CM | POA: Diagnosis not present

## 2021-04-25 DIAGNOSIS — R41840 Attention and concentration deficit: Secondary | ICD-10-CM | POA: Diagnosis not present

## 2021-04-25 DIAGNOSIS — I69354 Hemiplegia and hemiparesis following cerebral infarction affecting left non-dominant side: Secondary | ICD-10-CM | POA: Diagnosis not present

## 2021-04-25 DIAGNOSIS — R471 Dysarthria and anarthria: Secondary | ICD-10-CM

## 2021-04-25 DIAGNOSIS — R29818 Other symptoms and signs involving the nervous system: Secondary | ICD-10-CM | POA: Diagnosis not present

## 2021-04-25 NOTE — Therapy (Signed)
[facility] [address] [place], [place], [address] Phone: [phone]   Fax:  [phone]  Physical Therapy Treatment  Patient Details  Name: David Craig MRN: [id number] Date of Birth: 1986-02-24 Referring Provider (PT): Reesa Chew, [place]   Encounter Date: 04/25/2021   PT End of Session - 04/25/21 1107     Visit Number 7    Number of Visits 15   2x/week x 7 weeks + eval = 15 visits   Authorization Type BCBS    Authorization - Visit Number 6    Authorization - Number of Visits 15    PT Start Time 1017    PT Stop Time 1100    PT Time Calculation (min) 43 min    Equipment Utilized During Treatment Gait belt   Bioness   Activity Tolerance Patient tolerated treatment well    Behavior During Therapy [facility] for tasks assessed/performed   Decreased safety awareness noted            No past medical history on file.  Past Surgical History:  Procedure Laterality Date   BUBBLE STUDY  02/21/2021   Procedure: BUBBLE STUDY;  Surgeon: Skeet Latch, MD;  Location: [facility];  Service: Cardiovascular;;   IR ANGIO INTRA EXTRACRAN SEL COM CAROTID INNOMINATE UNI L MOD SED  02/15/2021   IR ANGIO VERTEBRAL SEL VERTEBRAL BILAT MOD SED  02/15/2021   IR CT HEAD LTD  02/15/2021   IR PERCUTANEOUS ART THROMBECTOMY/INFUSION INTRACRANIAL INC DIAG ANGIO  02/15/2021   IR US GUIDE VASC ACCESS RIGHT  02/15/2021   RADIOLOGY WITH ANESTHESIA N/A 02/14/2021   Procedure: IR WITH ANESTHESIA;  Surgeon: Luanne Bras, MD;  Location: [place] [facility];  Service: Radiology;  Laterality: N/A;   TEE WITHOUT CARDIOVERSION N/A 02/21/2021   Procedure: TRANSESOPHAGEAL ECHOCARDIOGRAM (TEE);  Surgeon: Skeet Latch, MD;  Location: [place];  Service: Cardiovascular;  Laterality: N/A;    There were no vitals filed for this visit.   Subjective Assessment - 04/25/21 1019     Subjective Denies new changes/complaints. No falls. No pain.    Pertinent  History Rt PCA CVA on 02-14-21; h/o polysubstance abuse (cocaine, marijuana, oxycodone), h/o migraines, h/o Lt ankle sprain    Diagnostic tests CT and MRI  -  revealed Rt PCA infarcts    Patient Stated Goals "Doing what I used to do" - take care of my kids (ages 81 1/2 and 1 1/2)    Currently in Pain? No/denies                [facility] PT Assessment - 04/25/21 0001       Standardized Balance Assessment   Standardized Balance Assessment Berg Balance Test      Berg Balance Test   Sit to Stand Able to stand without using hands and stabilize independently    Standing Unsupported Able to stand safely 2 minutes    Sitting with Back Unsupported but Feet Supported on Floor or Stool Able to sit safely and securely 2 minutes    Stand to Sit Sits safely with minimal use of hands    Transfers Able to transfer safely, definite need of hands    Standing Unsupported with Eyes Closed Able to stand 10 seconds safely    Standing Unsupported with Feet Together Able to place feet together independently and stand for 1 minute with supervision    From Standing, Reach Forward with Outstretched Arm Can reach forward >12 cm safely (5")   8"  From Standing Position, Pick up Object from [place] to pick up shoe, needs supervision    From Standing Position, Turn to Look Behind Over each Shoulder Looks behind one side only/other side shows less weight shift    Turn 360 Degrees Needs close supervision or verbal cueing    Standing Unsupported, Alternately Place Feet on Step/Stool Able to complete 4 steps without aid or supervision    Standing Unsupported, One Foot in [place] to take small step independently and hold 30 seconds    Standing on One Leg Tries to lift leg/unable to hold 3 seconds but remains standing independently    Total Score 41    Berg comment: 41/56                [facility] Adult PT Treatment/Exercise - 04/25/21 0001       Transfers   Transfers Sit to Stand;Stand to Sit    Sit to Stand  5: Supervision    Sit to Stand Details (indicate cue type and reason) continued cues for equal weightbearing    Stand to Sit 5: Supervision      Ambulation/Gait   Ambulation/Gait Yes    Ambulation/Gait Assistance 4: Min assist    Ambulation/Gait Assistance Details removed AFO, donned bioness to L ant tib and L hamstring and used in gait mode and use of RW. Completed gait training x 350 ft. PT providing cues for posture, step length, and improved weight shift forwards to increase terminal hip extension on L during terminal stance.  Required assistance for heel strike for DF stimulation.  No evidence of genu recurvatum in L stance with activation of hamstring today.    Ambulation Distance (Feet) 350 Feet    Assistive device Rolling walker    Gait Pattern Step-through pattern;Decreased stride length;Decreased hip/knee flexion - left;Decreased dorsiflexion - left;Trunk flexed;Poor foot clearance - left    Ambulation Surface Level;Indoor      Modalities   Modalities Energy manager Bioness functional e-stim to L anterior tib and L hamstring    Electrical Stimulation Action open/close chain ankle DF and knee flexion with gait training    Electrical Stimulation Parameters see Tablet 1; quick fit lower electrodes    Electrical Stimulation Goals Strength;Neuromuscular facilitation                     PT Education - 04/25/21 1112     Education Details progress toward STG; Air cabin crew) Educated Patient    Methods Explanation    Comprehension Verbalized understanding              PT Short Term Goals - 04/25/21 1153       PT SHORT TERM GOAL #1   Title Independent in HEP for LLE strengthening and stretching.    Baseline reports independence with current HEP, will continue to benefit from progressive HEP    Time 3    Period Weeks    Status Achieved    Target Date 04/27/21      PT SHORT  TERM GOAL #2   Title Pt will participate in assessment of Berg balance test.    Baseline Berg balance assessed on 11/23 scored 41/56    Time 3    Period Weeks    Status Achieved    Target Date 04/27/21      PT SHORT TERM GOAL #3   Title Pt will amb.  with RW 350' with SBA on flat, even surface.    Baseline met 04-24-21    Time 3    Period Weeks    Status Achieved    Target Date 04/27/21      PT SHORT TERM GOAL #4   Title Pt will transfer wheelchair to mat independently without cues to lock brakes.    Baseline SBA but needed assistance with locking brakes; pt walked into clinic with RW (is not using wheelchair to access clinic) - 04-24-21    Time 3    Period Weeks    Status Deferred    Target Date 04/27/21               PT Long Term Goals - 04/24/21 1521       PT LONG TERM GOAL #1   Title Pt will amb. 250' with large- based quad cane with CGA.    Time 7    Period Weeks    Status New      PT LONG TERM GOAL #2   Title Pt will negotiate steps with 1 hand rail with SBA.    Time 7      PT LONG TERM GOAL #3   Title Improve Berg balance score by at least 8 points to demo improvement in balance and to reduce fall risk.    Time 7    Period Weeks    Status New      PT LONG TERM GOAL #4   Title Pt will stand for at least 5" without UE support and reach at least 10" outside BOS independently for increased independence with ADL's.    Time 7    Period Weeks    Status New      PT LONG TERM GOAL #5   Title Negotiate ramp and curb with LBQC with CGA.    Time 7    Period Weeks    Status New                   Plan - 04/25/21 1154     Clinical Impression Statement Finished assesment of patient's progress toward STG. Patient able to meet STG #1 and #2 today. patient scored 41/56 on berg balance indicating high fall risk at this time. Rest of session focused on continued use of bioness with gait training. Patietn continue to respond well with bioness, but require  Min A at times. Will continue per POC.    Personal Factors and Comorbidities Behavior Pattern;Comorbidity 2;Transportation;Profession    Comorbidities s/p R PCA CVA with Lt hemiparesis, polysubstance abuse, migraines, obesity, h/o Lt sprained ankle    Examination-Activity Limitations Bathing;Locomotion Level;Transfers;Bend;Caring for Others;Dressing;Lift;Stand;Stairs;Squat;Reach Overhead    Examination-Participation Restrictions Cleaning;Community Activity;Driving;Interpersonal Relationship;Laundry;Yard Work;Shop;Occupation;Meal Prep    Stability/Clinical Decision Making Evolving/Moderate complexity    Rehab Potential Good    PT Frequency 2x / week    PT Duration Other (comment)   7 weeks  (15 visit limit)   PT Treatment/Interventions ADLs/Self Care Home Management;DME Instruction;Gait training;Stair training;Therapeutic activities;Therapeutic exercise;Balance training;Neuromuscular re-education;Patient/family education;Passive range of motion;Visual/perceptual remediation/compensation    PT Next Visit Plan cont with Bioness - Tablet 1, quick fit electrodes, R upper cuff for L hamstring.  Gait training - decrease reliance on RW; work on initiation of L swing phase and increased L stance phase/SLS.  Bioness in training mode for hamstring exercises.  Review and add to HEP for LLE. continue heel slides. Potential Berg Balance. reaching/weight shift activities.    Consulted and Agree with Plan  of Care Patient             Patient will benefit from skilled therapeutic intervention in order to improve the following deficits and impairments:  Abnormal gait, Decreased activity tolerance, Decreased balance, Decreased safety awareness, Decreased coordination, Decreased strength, Impaired tone, Impaired vision/preception, Impaired UE functional use  Visit Diagnosis: Muscle weakness (generalized)  Other abnormalities of gait and mobility  Unsteadiness on feet     Problem List Patient Active  Problem List   Diagnosis Date Noted   Sprain of left ankle    Sleep disturbance    Hyperglycemia    Hypoalbuminemia due to protein-calorie malnutrition (HCC)    Dyslipidemia    Hemiparesis affecting left side as late effect of stroke (HCC)    Lethargy    Cerebrovascular accident (CVA) due to occlusion of right posterior cerebral artery ([place]) 02/22/2021   Cerebrovascular accident (CVA) ([place])    Right carotid artery occlusion 02/14/2021    Jones Bales, PT, DPT 04/25/2021, 11:58 AM  [facility] [address] [place], [place], [address] Phone: [phone]   Fax:  [phone]  Name: Delontae Lamm MRN: [id number] Date of Birth: 10-19-1985

## 2021-04-25 NOTE — Therapy (Signed)
[facility] [address] [place], [place], [address] Phone: [phone]   Fax:  [phone]  Speech Language Pathology Treatment  Patient Details  Name: David Craig MRN: [id number] Date of Birth: February 03, 1986 Referring Provider (SLP): Jacquelynn Cree, [place]   Encounter Date: 04/25/2021   End of Session - 04/25/21 1139     Visit Number 6    Number of Visits 17    Date for SLP Re-Evaluation 05/25/21    Authorization Type BCBS             History reviewed. No pertinent past medical history.  Past Surgical History:  Procedure Laterality Date   BUBBLE STUDY  02/21/2021   Procedure: BUBBLE STUDY;  Surgeon: Chilton Si, MD;  Location: [facility] ENDOSCOPY;  Service: Cardiovascular;;   IR ANGIO INTRA EXTRACRAN SEL COM CAROTID INNOMINATE UNI L MOD SED  02/15/2021   IR ANGIO VERTEBRAL SEL VERTEBRAL BILAT MOD SED  02/15/2021   IR CT HEAD LTD  02/15/2021   IR PERCUTANEOUS ART THROMBECTOMY/INFUSION INTRACRANIAL INC DIAG ANGIO  02/15/2021   IR US GUIDE VASC ACCESS RIGHT  02/15/2021   RADIOLOGY WITH ANESTHESIA N/A 02/14/2021   Procedure: IR WITH ANESTHESIA;  Surgeon: Julieanne Cotton, MD;  Location[facility];  Service: Radiology;  Laterality: N/A;   TEE WITHOUT CARDIOVERSION N/A 02/21/2021   Procedure: TRANSESOPHAGEAL ECHOCARDIOGRAM (TEE);  Surgeon: Chilton Si, MD;  Location: [facility] ENDOSCOPY;  Service: Cardiovascular;  Laterality: N/A;    There were no vitals filed for this visit.   Subjective Assessment - 04/25/21 0941     Subjective "I do them about 50%" PT, OT exercises    Currently in Pain? No/denies                   ADULT SLP TREATMENT - 04/25/21 0941       General Information   Behavior/Cognition Alert;Cooperative;Pleasant mood      Treatment Provided   Treatment provided Cognitive-Linquistic      Cognitive-Linquistic Treatment   Treatment focused on Cognition;Dysarthria;Patient/family/caregiver  education    Skilled Treatment David Craig reports he continues to use multiple alarms to manage medications. Structured speech task generating sentences with multisyllabic words using over articulation, slow rate with occasional min A for over articulation. David Craig self corrected 2 words which he slurred, independently  In Simple conversation, David Craig maintained intelligible speech with slow rate over 14 minutes [place] carried over strategies to maintain clear speech      Assessment / Recommendations / Plan   Plan Continue with current plan of care      Progression Toward Goals   Progression toward goals Progressing toward goals                SLP Short Term Goals - 04/25/21 1138       SLP SHORT TERM GOAL #1   Title Pt will complete HEP with rare min A over 3 sessions    Baseline 04-18-21, 04-20-21    Time 2    Period Weeks    Status On-going      SLP SHORT TERM GOAL #2   Title Pt will comprehend and sort medications in role play medication management tasks with 90% accuracy    Time 2    Period Weeks    Status On-going      SLP SHORT TERM GOAL #3   Title Pt will comprehend and complete finanical management tasks with 80% accuracy over 2 sesssions    Time 2  Period Weeks    Status Deferred      SLP SHORT TERM GOAL #4   Title Pt will demonstrate dysarthria compensations to be 100% intelligble in 10 minute conversations over 2 sessions    Time 2    Period Weeks    Status On-going      SLP SHORT TERM GOAL #5   Title Pt will complete cognitive communication PROM in first few sessions    Status Achieved              SLP Long Term Goals - 04/25/21 1139       SLP LONG TERM GOAL #1   Title Pt will verbalize and demonstrate memory/attention compensations to aid daily functioning given rare min A over 2 sessions    Baseline 04-24-21; 04/25/21    Time 6    Period Weeks    Status On-going      SLP LONG TERM GOAL #2   Title Pt will independently manage medications with rare  min A from wife over 4 sessions    Time 6    Period Weeks    Status On-going      SLP LONG TERM GOAL #3   Title Pt will demonstrate dysarthria compensations to be 100% intelligble in 20+ minute conversations over 2 sessions    Time 6    Period Weeks    Status On-going      SLP LONG TERM GOAL #4   Title Pt will report improved cognition and communication via PROM by 2 points at last ST session    Time 6    Period Weeks    Status On-going              Plan - 04/25/21 1017     Clinical Impression Statement "David Craig" was referred for OPST intervention secondary to CVA in September 2022. Some improvements reported for cognition and speech. Dysarthria compensations and HEP reviewed today. Intermittent min A required for over-articulation and slowed rate for more advanced tongue twisters. SLP assisted with establishment and training of external memory aids. Pt desires to return to baseline. Pt would benefit from skilled ST targeting cognitive linguistic skills and dysarthria to maximize return to PLOF.    Speech Therapy Frequency 2x / week    Duration 8 weeks    Treatment/Interventions Compensatory strategies;Cueing hierarchy;Functional tasks;Cognitive reorganization;Multimodal communcation approach;Language facilitation;Compensatory techniques;Internal/external aids;SLP instruction and feedback    Potential to Achieve Goals Good             Patient will benefit from skilled therapeutic intervention in order to improve the following deficits and impairments:   Dysarthria and anarthria  Cognitive communication deficit    Problem List Patient Active Problem List   Diagnosis Date Noted   Sprain of left ankle    Sleep disturbance    Hyperglycemia    Hypoalbuminemia due to protein-calorie malnutrition (HCC)    Dyslipidemia    Hemiparesis affecting left side as late effect of stroke (HCC)    Lethargy    Cerebrovascular accident (CVA) due to occlusion of right posterior cerebral  artery (HCC) 02/22/2021   Cerebrovascular accident (CVA) [facility])    Right carotid artery occlusion 02/14/2021    David Craig, Radene Journey, CCC-SLP 04/25/2021, 11:39 AM  [facility] [address] [place], [place], [address] Phone: [phone]   Fax:  [phone]   Name: David Craig MRN: [id number] Date of Birth: 1986/05/21

## 2021-04-25 NOTE — Patient Instructions (Signed)
   Aquatic Therapy: What to Expect!  Where:  [facility] [address] [address] [place], [place] [address] [phone]           How to Prepare: Please make sure you drink 8 ounces of water about one hour prior to your pool session A caregiver must attend the entire session with the patient (unless your primary therapists feels this is not necessary). The caregiver will be responsible for assisting with dressing as well as any toileting needs.  Please arrive IN YOUR SUIT and a few minutes prior to your appointment - this helps to avoid delays in starting your session. Please make sure to attend to any toileting needs prior to entering the pool Once on the pool deck your therapist will ask you to sign the Patient  Consent and Assignment of Benefits form Your therapist may take your blood pressure prior to, during and after your session if indicated We usually try and create a home exercise program based on activities we do in the pool.  Please be thinking about who might be able to assist you in the pool should you want to participate in an aquatic home exercise program at the time of discharge.  Some patients do not want to or do not have the ability to participate in an aquatic home program - this is not a barrier in any way to you participating in aquatic therapy as part of your current therapy plan!    About the pool: Entering the pool Your therapist will assist you; there are multiple ways to enter including stairs with railings, a walk in ramp, a roll in chair and a mechanical lift. Your therapist will determine the most appropriate way for you. Water temperature is usually between 86-87 degrees There may be other swimmers in the pool at the same time     Contact Info:             Appointments: [facility]         All sessions are 45 minutes   [address]  [address]            Please call the [facility]  if   [place], [place]  [address]           you need to cancel or reschedule an appointment.  [phone]           Suzanne Dilday, PT    Karen Pulaski, OTR/L    11/24/19  

## 2021-04-25 NOTE — Patient Instructions (Addendum)
   Add to your chores, maybe folding towels or kids clothes if your wife can handle that the folding is not perfect  PT/OT exercises are important - try for 75%   Be aware of how noisy the environment is, you will need to talk slower and louder if it is noisy  If you are tired and your speech gets worse (which is normal) reduce background noise (mute TV, turn off music or kids toys, loud appliances)

## 2021-04-25 NOTE — Therapy (Signed)
[facility] [address] [place], [place], [address] Phone: [phone]   Fax:  [phone]  Occupational Therapy Treatment  Patient Details  Name: David Craig MRN: [id number] Date of Birth: [birth year] Referring Provider (OT): Algis Liming   Encounter Date: 04/25/2021   OT End of Session - 04/25/21 1101     Visit Number 6    Number of Visits 15    Date for OT Re-Evaluation 05/30/21    Authorization Type BCBS    Authorization Time Period VL: 30 (PT,OT)    Authorization - Visit Number 5    Authorization - Number of Visits 15    OT Start Time 1100    OT Stop Time 1145    OT Time Calculation (min) 45 min    Activity Tolerance Patient tolerated treatment well    Behavior During Therapy [facility] for tasks assessed/performed             History reviewed. No pertinent past medical history.  Past Surgical History:  Procedure Laterality Date   BUBBLE STUDY  02/21/2021   Procedure: BUBBLE STUDY;  Surgeon: Skeet Latch, MD;  Location: [place];  Service: Cardiovascular;;   IR ANGIO INTRA EXTRACRAN SEL COM CAROTID INNOMINATE UNI L MOD SED  02/15/2021   IR ANGIO VERTEBRAL SEL VERTEBRAL BILAT MOD SED  02/15/2021   IR CT HEAD LTD  02/15/2021   IR PERCUTANEOUS ART THROMBECTOMY/INFUSION INTRACRANIAL INC DIAG ANGIO  02/15/2021   IR US GUIDE VASC ACCESS RIGHT  02/15/2021   RADIOLOGY WITH ANESTHESIA N/A 02/14/2021   Procedure: IR WITH ANESTHESIA;  Surgeon: Luanne Bras, MD;  Location: [place];  Service: Radiology;  Laterality: N/A;   TEE WITHOUT CARDIOVERSION N/A 02/21/2021   Procedure: TRANSESOPHAGEAL ECHOCARDIOGRAM (TEE);  Surgeon: Skeet Latch, MD;  Location: [place];  Service: Cardiovascular;  Laterality: N/A;    There were no vitals filed for this visit.   Subjective Assessment - 04/25/21 1100     Subjective  "last night was busy - my in laws came into town and we went and got pizza"    Pertinent History  PMH: polysubstance abuse/tobacco, migrane headaches and obesity    Limitations Fall Risk, Impulsive, L hemi    Patient Stated Goals "get back to normal self" and to "be able to walk to bathroom and pee and pick up my kids".    Currently in Pain? No/denies    Pain Score 0-No pain             Environmental Scanning with 16/16 accuracy = 100% on first pass.    Digital/Analog Times with emphasis on visual scanning. Pt took increased time for items to the left but was able to locate.       OT Treatments/Exercises (OP) - 04/25/21 1120       Modalities   Modalities Electrical Stimulation      Electrical Stimulation   Electrical Stimulation Location Wrist LUE   worked on encouraging neuroplasticity with active wrist extension/hand opening with run of Office manager Action extension / digit extension    Electrical Stimulation Parameters 10 minutes x NMES x small muscle x 24 intensity    Electrical Stimulation Goals Neuromuscular facilitation;Strength              Pt got scheduled for aquatics therapy on Monday and will cancel 1 OT session for next week after Aquatics therapy to adhere to 2x/week frequency. Pt was educated on Aquatics information again and reviewed. Verbalized  understanding.        OT Short Term Goals - 04/25/21 1110       OT SHORT TERM GOAL #1   Title Pt will be independent with initial HEP    Time 4    Period Weeks    Status On-going    Target Date 04/25/21      OT SHORT TERM GOAL #2   Title Pt will report pain no greater than 7/10 with HEP    Baseline 8/10 with shoulder flexion/abduction    Time 4    Period Weeks    Status On-going      OT SHORT TERM GOAL #3   Title Pt will verbalize and demonstrate understanding of visual scanning startegies for visual field cut.    Baseline left visual field cut    Time 4    Period Weeks    Status On-going      OT SHORT TERM GOAL #4   Title Pt will perform environmental scanning with  90% accuracy or greater    Time 4    Period Weeks    Status Achieved   100% on 04/25/21              OT Long Term Goals - 04/10/21 1538       OT LONG TERM GOAL #1   Title Pt will be independent with updated HEP    Time 9    Period Weeks    Status On-going      OT LONG TERM GOAL #2   Title Pt will perform table top scanning with 95% accuracy or greater with use of visual scanning strategies.    Baseline 89% accuracy with 88's    Time 9    Period Weeks    Status On-going      OT LONG TERM GOAL #3   Title Pt will demonstrate ability to grasp and release cylindrical object and/or 1 inch block with LUE.    Baseline trace volitional composite flexion    Time 9    Period Weeks    Status On-going      OT LONG TERM GOAL #4   Title Pt will demonstrate active LUE movement to reach to low level surface for progression to reaching and obtaining objects    Baseline no active flexion    Time 9    Period Weeks    Status On-going      OT LONG TERM GOAL #5   Title PT will demonsrate using LUE as active and functional stabilizer for 20% or greater of ADLs and IADLs.    Baseline 0%    Time 9    Period Weeks    Status On-going                   Plan - 04/25/21 1113     Clinical Impression Statement Pt with improved visual scanning skills since eval - met STG #4 with environmental scanning.    OT Occupational Profile and History Problem Focused Assessment - Including review of records relating to presenting problem    Occupational performance deficits (Please refer to evaluation for details): IADL's;ADL's;Leisure;Work    Marketing executive / Function / Physical Skills ADL;Decreased knowledge of use of DME;Vision;Sensation;Flexibility;Coordination;IADL;ROM;UE functional use;Tone;Pain;Strength;GMC;Dexterity    Cognitive Skills Attention;Safety Awareness    Rehab Potential Good    Clinical Decision Making Limited treatment options, no task modification necessary     Comorbidities Affecting Occupational Performance: None    Modification or Assistance to  Complete Evaluation  No modification of tasks or assist necessary to complete eval    OT Frequency 2x / week    OT Duration Other (comment)   15 visits over 9 weeks d/t any scheduling conflicts   OT Treatment/Interventions Self-care/ADL training;Aquatic Therapy;Electrical Stimulation;Energy conservation;Manual Therapy;Patient/family education;Visual/perceptual remediation/compensation;Passive range of motion;Neuromuscular education;Therapist, nutritional;Therapeutic exercise;Cognitive remediation/compensation;Moist Heat;Fluidtherapy;DME and/or AE instruction;Splinting;Therapeutic activities;Ultrasound    Plan HEP for self PROM for LUE, Neuroreed of LUE    Consulted and Agree with Plan of Care Patient             Patient will benefit from skilled therapeutic intervention in order to improve the following deficits and impairments:   Body Structure / Function / Physical Skills: ADL, Decreased knowledge of use of DME, Vision, Sensation, Flexibility, Coordination, IADL, ROM, UE functional use, Tone, Pain, Strength, GMC, Dexterity Cognitive Skills: Attention, Safety Awareness     Visit Diagnosis: Muscle weakness (generalized)  Hemiplegia and hemiparesis following other cerebrovascular disease affecting left non-dominant side (HCC)  Other abnormalities of gait and mobility  Hemiplegia and hemiparesis following cerebral infarction affecting left non-dominant side (HCC)  Acute pain of left shoulder  Attention and concentration deficit  Unsteadiness on feet  Other lack of coordination    Problem List Patient Active Problem List   Diagnosis Date Noted   Sprain of left ankle    Sleep disturbance    Hyperglycemia    Hypoalbuminemia due to protein-calorie malnutrition (HCC)    Dyslipidemia    Hemiparesis affecting left side as late effect of stroke (HCC)    Lethargy    Cerebrovascular  accident (CVA) due to occlusion of right posterior cerebral artery ([place]) 02/22/2021   Cerebrovascular accident (CVA) ([place])    Right carotid artery occlusion 02/14/2021    David Craig, OT/L 04/25/2021, 11:50 AM  David [facility] [address] [place], [place], [address] Phone: [phone]   Fax:  [phone]  Name: David Craig MRN: [id number] Date of Birth: 1985/12/25

## 2021-04-30 ENCOUNTER — Ambulatory Visit: Payer: BC Managed Care – PPO | Attending: Physical Medicine and Rehabilitation | Admitting: Occupational Therapy

## 2021-04-30 ENCOUNTER — Encounter: Payer: Self-pay | Admitting: Occupational Therapy

## 2021-04-30 ENCOUNTER — Other Ambulatory Visit: Payer: Self-pay

## 2021-04-30 DIAGNOSIS — R278 Other lack of coordination: Secondary | ICD-10-CM | POA: Diagnosis not present

## 2021-04-30 DIAGNOSIS — R41841 Cognitive communication deficit: Secondary | ICD-10-CM | POA: Diagnosis not present

## 2021-04-30 DIAGNOSIS — R29818 Other symptoms and signs involving the nervous system: Secondary | ICD-10-CM | POA: Diagnosis not present

## 2021-04-30 DIAGNOSIS — R471 Dysarthria and anarthria: Secondary | ICD-10-CM | POA: Diagnosis not present

## 2021-04-30 DIAGNOSIS — M25512 Pain in left shoulder: Secondary | ICD-10-CM | POA: Insufficient documentation

## 2021-04-30 DIAGNOSIS — M6281 Muscle weakness (generalized): Secondary | ICD-10-CM | POA: Insufficient documentation

## 2021-04-30 DIAGNOSIS — I69854 Hemiplegia and hemiparesis following other cerebrovascular disease affecting left non-dominant side: Secondary | ICD-10-CM | POA: Insufficient documentation

## 2021-04-30 DIAGNOSIS — R2689 Other abnormalities of gait and mobility: Secondary | ICD-10-CM | POA: Diagnosis not present

## 2021-04-30 DIAGNOSIS — R41840 Attention and concentration deficit: Secondary | ICD-10-CM | POA: Diagnosis not present

## 2021-04-30 DIAGNOSIS — I69354 Hemiplegia and hemiparesis following cerebral infarction affecting left non-dominant side: Secondary | ICD-10-CM | POA: Diagnosis not present

## 2021-04-30 DIAGNOSIS — R2681 Unsteadiness on feet: Secondary | ICD-10-CM | POA: Diagnosis not present

## 2021-04-30 DIAGNOSIS — I63531 Cerebral infarction due to unspecified occlusion or stenosis of right posterior cerebral artery: Secondary | ICD-10-CM | POA: Diagnosis not present

## 2021-04-30 NOTE — Therapy (Signed)
[facility] [address] [place], [place], [address] Phone: [phone]   Fax:  [phone]  Occupational Therapy Treatment  Patient Details  Name: David Craig MRN: [id number] Date of Birth: [birth year] Referring Provider (OT): Marissa Nestle   Encounter Date: 04/30/2021   OT End of Session - 04/30/21 2003     Visit Number 7    Number of Visits 15    Date for OT Re-Evaluation 05/30/21    Authorization Type BCBS    Authorization Time Period VL: 30 (PT,OT)    Authorization - Visit Number 6    Authorization - Number of Visits 15    OT Start Time 1500    OT Stop Time 1600    OT Time Calculation (min) 60 min    Activity Tolerance Patient tolerated treatment well    Behavior During Therapy [facility] for tasks assessed/performed             History reviewed. No pertinent past medical history.  Past Surgical History:  Procedure Laterality Date   BUBBLE STUDY  02/21/2021   Procedure: BUBBLE STUDY;  Surgeon: Chilton Si, MD;  Location: [facility] ENDOSCOPY;  Service: Cardiovascular;;   IR ANGIO INTRA EXTRACRAN SEL COM CAROTID INNOMINATE UNI L MOD SED  02/15/2021   IR ANGIO VERTEBRAL SEL VERTEBRAL BILAT MOD SED  02/15/2021   IR CT HEAD LTD  02/15/2021   IR PERCUTANEOUS ART THROMBECTOMY/INFUSION INTRACRANIAL INC DIAG ANGIO  02/15/2021   IR US GUIDE VASC ACCESS RIGHT  02/15/2021   RADIOLOGY WITH ANESTHESIA N/A 02/14/2021   Procedure: IR WITH ANESTHESIA;  Surgeon: Julieanne Cotton, MD;  [facility];  Service: Radiology;  Laterality: N/A;   TEE WITHOUT CARDIOVERSION N/A 02/21/2021   Procedure: TRANSESOPHAGEAL ECHOCARDIOGRAM (TEE);  Surgeon: Chilton Si, MD;  Location: [facility] ENDOSCOPY;  Service: Cardiovascular;  Laterality: N/A;    There were no vitals filed for this visit.   Subjective Assessment - 04/30/21 1949     Subjective  Patient indicated that he is comfortable in the water.    Patient is accompanied by: Family member     Pertinent History PMH: polysubstance abuse/tobacco, migrane headaches and obesity    Limitations Fall Risk, Impulsive, L hemi    Patient Stated Goals "get back to normal self" and to "be able to walk to bathroom and pee and pick up my kids".    Currently in Pain? No/denies    Pain Score 0-No pain              Patient seen for aquatic therapy visit.  Patient entered and exited pool via stairs and min assist.   Session occurred in 3.5-4.5 ft of water.   This was patient's first aquatic therapy visit.  Patient with difficulty maintaining left foot alignment in walking and standing.  Patient with supinated foot with limited dorsiflexion.  Worked to improve foot alignment as base of support then alignment of left leg and trunk over foot.  Patient able to feel when foot aligned and replicate this pattern with slow forward walking.  Worked on stepping up- standing on left foot to step up with right.  Patient with best response with overt weight shift toward left side.   Worked on reducing tension in LUE through supine floatation and by attaching floatation equipment as support.  Patient with very strong flexor synergy patterns - used supine floatation to reduce tension in shoulder adductors, and neck lateral flexors.  OT Short Term Goals - 04/30/21 2012       OT SHORT TERM GOAL #1   Title Pt will be independent with initial HEP    Time 4    Period Weeks    Status On-going    Target Date 04/25/21      OT SHORT TERM GOAL #2   Title Pt will report pain no greater than 7/10 with HEP    Baseline 8/10 with shoulder flexion/abduction    Time 4    Period Weeks    Status On-going      OT SHORT TERM GOAL #3   Title Pt will verbalize and demonstrate understanding of visual scanning startegies for visual field cut.    Baseline left visual field cut    Time 4    Period Weeks    Status On-going      OT SHORT TERM GOAL #4   Title Pt will perform  environmental scanning with 90% accuracy or greater    Time 4    Period Weeks    Status Achieved   100% on 04/25/21              OT Long Term Goals - 04/30/21 2013       OT LONG TERM GOAL #1   Title Pt will be independent with updated HEP    Time 9    Period Weeks    Status On-going      OT LONG TERM GOAL #2   Title Pt will perform table top scanning with 95% accuracy or greater with use of visual scanning strategies.    Baseline 89% accuracy with 88's    Time 9    Period Weeks    Status On-going      OT LONG TERM GOAL #3   Title Pt will demonstrate ability to grasp and release cylindrical object and/or 1 inch block with LUE.    Baseline trace volitional composite flexion    Time 9    Period Weeks    Status On-going      OT LONG TERM GOAL #4   Title Pt will demonstrate active LUE movement to reach to low level surface for progression to reaching and obtaining objects    Baseline no active flexion    Time 9    Period Weeks    Status On-going      OT LONG TERM GOAL #5   Title PT will demonsrate using LUE as active and functional stabilizer for 20% or greater of ADLs and IADLs.    Baseline 0%    Time 9    Period Weeks    Status On-going                   Plan - 04/30/21 2004     Clinical Impression Statement Pt had first aquatic therapy visit today.  Patient has potential for improved postural control, functional mobility and volitional control of LUE with combination of clinic and pool OT interventions.  Patient with significant muscle spasticity which create challenges to isolated control and safe functional mobility.    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;Decreased knowledge of use of  DME;Vision;Sensation;Flexibility;Coordination;IADL;ROM;UE functional use;Tone;Pain;Strength;GMC;Dexterity    Cognitive Skills Attention;Safety Awareness    Rehab Potential Good    Clinical Decision  Making Limited treatment options, no task modification necessary    Comorbidities Affecting Occupational Performance: None    Modification or Assistance to Complete Evaluation  No modification of tasks or assist necessary to complete eval    OT Frequency 2x / week    OT Duration Other (comment)   15 visits over 9 weeks d/t any scheduling conflicts   OT Treatment/Interventions Self-care/ADL training;Aquatic Therapy;Electrical Stimulation;Energy conservation;Manual Therapy;Patient/family education;Visual/perceptual remediation/compensation;Passive range of motion;Neuromuscular education;Building services engineer;Therapeutic exercise;Cognitive remediation/compensation;Moist Heat;Fluidtherapy;DME and/or AE instruction;Splinting;Therapeutic activities;Ultrasound    Plan HEP for self PROM for LUE, Neuroreed of LUE, aquatics for postural control, functional mobility, and NMR LUE    Consulted and Agree with Plan of Care Patient    Family Member Consulted spouse             Patient will benefit from skilled therapeutic intervention in order to improve the following deficits and impairments:   Body Structure / Function / Physical Skills: ADL, Decreased knowledge of use of DME, Vision, Sensation, Flexibility, Coordination, IADL, ROM, UE functional use, Tone, Pain, Strength, GMC, Dexterity Cognitive Skills: Attention, Safety Awareness     Visit Diagnosis: Hemiplegia and hemiparesis following other cerebrovascular disease affecting left non-dominant side (HCC)  Acute pain of left shoulder  Attention and concentration deficit  Other lack of coordination  Other symptoms and signs involving the nervous system  Muscle weakness (generalized)    Problem List Patient Active Problem List    Diagnosis Date Noted   Sprain of left ankle    Sleep disturbance    Hyperglycemia    Hypoalbuminemia due to protein-calorie malnutrition (HCC)    Dyslipidemia    Hemiparesis affecting left side as late effect of stroke (HCC)    Lethargy    Cerebrovascular accident (CVA) due to occlusion of right posterior cerebral artery (HCC) 02/22/2021   Cerebrovascular accident (CVA) (HCC)    Right carotid artery occlusion 02/14/2021    Collier Salina, OT/L 04/30/2021, 8:14 PM  [facility] [address] [place], [place], [address] Phone: [phone]   Fax:  [phone]  Name: David Craig MRN: [id number] Date of Birth: [birth year]

## 2021-05-01 ENCOUNTER — Encounter: Payer: Self-pay | Admitting: Speech Pathology

## 2021-05-01 ENCOUNTER — Ambulatory Visit: Payer: BC Managed Care – PPO | Admitting: Physical Therapy

## 2021-05-01 ENCOUNTER — Ambulatory Visit: Payer: BC Managed Care – PPO | Admitting: Occupational Therapy

## 2021-05-01 ENCOUNTER — Ambulatory Visit: Payer: BC Managed Care – PPO | Admitting: Speech Pathology

## 2021-05-01 DIAGNOSIS — R2681 Unsteadiness on feet: Secondary | ICD-10-CM | POA: Diagnosis not present

## 2021-05-01 DIAGNOSIS — M25512 Pain in left shoulder: Secondary | ICD-10-CM | POA: Diagnosis not present

## 2021-05-01 DIAGNOSIS — R2689 Other abnormalities of gait and mobility: Secondary | ICD-10-CM | POA: Diagnosis not present

## 2021-05-01 DIAGNOSIS — I69354 Hemiplegia and hemiparesis following cerebral infarction affecting left non-dominant side: Secondary | ICD-10-CM | POA: Diagnosis not present

## 2021-05-01 DIAGNOSIS — I69854 Hemiplegia and hemiparesis following other cerebrovascular disease affecting left non-dominant side: Secondary | ICD-10-CM | POA: Diagnosis not present

## 2021-05-01 DIAGNOSIS — M6281 Muscle weakness (generalized): Secondary | ICD-10-CM | POA: Diagnosis not present

## 2021-05-01 DIAGNOSIS — R278 Other lack of coordination: Secondary | ICD-10-CM | POA: Diagnosis not present

## 2021-05-01 DIAGNOSIS — R29818 Other symptoms and signs involving the nervous system: Secondary | ICD-10-CM | POA: Diagnosis not present

## 2021-05-01 DIAGNOSIS — R41840 Attention and concentration deficit: Secondary | ICD-10-CM | POA: Diagnosis not present

## 2021-05-01 DIAGNOSIS — R41841 Cognitive communication deficit: Secondary | ICD-10-CM | POA: Diagnosis not present

## 2021-05-01 DIAGNOSIS — R471 Dysarthria and anarthria: Secondary | ICD-10-CM

## 2021-05-01 DIAGNOSIS — I63531 Cerebral infarction due to unspecified occlusion or stenosis of right posterior cerebral artery: Secondary | ICD-10-CM | POA: Diagnosis not present

## 2021-05-01 NOTE — Therapy (Signed)
[facility] [address] [place], [place], [address] Phone: [phone]   Fax:  [phone]  Speech Language Pathology Treatment & Discharge Summary  Patient Details  Name: David Craig MRN: [id number] Date of Birth: [birth year] Referring Provider (SLP): Bary Leriche, [place]   Encounter Date: 05/01/2021   End of Session - 05/01/21 1223     Visit Number 7    Number of Visits 17    Date for SLP Re-Evaluation 05/25/21    Authorization Type BCBS    SLP Start Time 2353    SLP Stop Time  1215    SLP Time Calculation (min) 30 min    Activity Tolerance Patient tolerated treatment well             History reviewed. No pertinent past medical history.  Past Surgical History:  Procedure Laterality Date   BUBBLE STUDY  02/21/2021   Procedure: BUBBLE STUDY;  Surgeon: Skeet Latch, MD;  Location: [place];  Service: Cardiovascular;;   IR ANGIO INTRA EXTRACRAN SEL COM CAROTID INNOMINATE UNI L MOD SED  02/15/2021   IR ANGIO VERTEBRAL SEL VERTEBRAL BILAT MOD SED  02/15/2021   IR CT HEAD LTD  02/15/2021   IR PERCUTANEOUS ART THROMBECTOMY/INFUSION INTRACRANIAL INC DIAG ANGIO  02/15/2021   IR US GUIDE VASC ACCESS RIGHT  02/15/2021   RADIOLOGY WITH ANESTHESIA N/A 02/14/2021   Procedure: IR WITH ANESTHESIA;  Surgeon: Luanne Bras, MD;  Location: [place];  Service: Radiology;  Laterality: N/A;   TEE WITHOUT CARDIOVERSION N/A 02/21/2021   Procedure: TRANSESOPHAGEAL ECHOCARDIOGRAM (TEE);  Surgeon: Skeet Latch, MD;  Location: [facility] [place];  Service: Cardiovascular;  Laterality: N/A;    There were no vitals filed for this visit.   Subjective Assessment - 05/01/21 1150     Subjective "Im talking better"    Currently in Pain? No/denies                   ADULT SLP TREATMENT - 05/01/21 1150       General Information   Behavior/Cognition Alert;Cooperative;Pleasant mood      Treatment Provided   Treatment  provided Cognitive-Linquistic      Cognitive-Linquistic Treatment   Treatment focused on Cognition;Dysarthria;Patient/family/caregiver education    Skilled Treatment David Craig reports his speech is improved, he is not having difficulty being understood, some slur remains at night with fatigue. He reports success texting and emailing. In moderately complex conversation. he maintained clear speech with 100% intelligilbility. He continues to manage his medications, appointments and transportation to therapies with success. Re-administered the Cognitive Function PROM. His score improved from 96 to 130, with "a little difficulty" planning for appointments and planning sereral days in advance. He is using calendar and alterts successfully to compensate for this.      Assessment / Recommendations / Plan   Plan Discharge SLP treatment due to (comment)      Progression Toward Goals   Progression toward goals Goals met, education completed, patient discharged from [facility]  Visits from Start of Care: 7  Current functional level related to goals / functional outcomes: See goals below   Remaining deficits: Mild dysarthria with fatigue   Education / Equipment: Compensations for cognition, memory and dysarthria; HEP for dysarthria   Patient agrees to discharge. Patient goals were met. Patient is being discharged due to meeting the stated rehab goals.Marland Kitchen       SLP  Short Term Goals - 05/01/21 1222       SLP SHORT TERM GOAL #1   Title Pt will complete HEP with rare min A over 3 sessions    Baseline 04-18-21, 04-20-21    Time 2    Period Weeks    Status Achieved      SLP SHORT TERM GOAL #2   Title Pt will comprehend and sort medications in role play medication management tasks with 90% accuracy    Time 2    Period Weeks    Status Achieved      SLP SHORT TERM GOAL #3   Title Pt will comprehend and complete finanical management tasks with 80% accuracy  over 2 sesssions    Time 2    Period Weeks    Status Deferred      SLP SHORT TERM GOAL #4   Title Pt will demonstrate dysarthria compensations to be 100% intelligble in 10 minute conversations over 2 sessions    Time 2    Period Weeks    Status Achieved      SLP SHORT TERM GOAL #5   Title Pt will complete cognitive communication PROM in first few sessions    Status Achieved              SLP Long Term Goals - 05/01/21 1223       SLP LONG TERM GOAL #1   Title Pt will verbalize and demonstrate memory/attention compensations to aid daily functioning given rare min A over 2 sessions    Baseline 04-24-21; 04/25/21    Time 6    Period Weeks    Status Achieved      SLP LONG TERM GOAL #2   Title Pt will independently manage medications with rare min A from wife over 4 sessions    Time 6    Period Weeks    Status Achieved      SLP LONG TERM GOAL #3   Title Pt will demonstrate dysarthria compensations to be 100% intelligble in 20+ minute conversations over 2 sessions    Time 6    Period Weeks    Status Achieved      SLP LONG TERM GOAL #4   Title Pt will report improved cognition and communication via PROM by 2 points at last ST session    Time 6    Period Weeks    Status Achieved   improved from 96 to 130             Plan - 05/01/21 1220     Clinical Impression Statement "David Craig" was referred for OPST intervention secondary to CVA in September 2022.  Improvements reported for cognition and speech. Dysarthria compensations and HEP reviewed today.  over-articulation and slowed rate not reqiured today, however David Craig is able to carryover these strategies when needed, in the evening when speech slurs. David Craig is independent with  external memory aids.  Goals met, education complete, d/c ST - pt in agreement    Speech Therapy Frequency 2x / week    Duration 8 weeks    Treatment/Interventions Compensatory strategies;Cueing hierarchy;Functional tasks;Cognitive  reorganization;Multimodal communcation approach;Language facilitation;Compensatory techniques;Internal/external aids;SLP instruction and feedback    Potential to Achieve Goals Good             Patient will benefit from skilled therapeutic intervention in order to improve the following deficits and impairments:   Dysarthria and anarthria  Cognitive communication deficit    Problem List Patient Active Problem List  Diagnosis Date Noted   Sprain of left ankle    Sleep disturbance    Hyperglycemia    Hypoalbuminemia due to protein-calorie malnutrition (HCC)    Dyslipidemia    Hemiparesis affecting left side as late effect of stroke ([place])    Lethargy    Cerebrovascular accident (CVA) due to occlusion of right posterior cerebral artery ([place]) 02/22/2021   Cerebrovascular accident (CVA) [facility])    Right carotid artery occlusion 02/14/2021    Somara Frymire, Annye Rusk, [place] 05/01/2021, 12:24 PM  [facility] [address] [place], [place], [address] Phone: [phone]   Fax:  [phone]   Name: David Craig MRN: [id number] Date of Birth: 09-16-1985

## 2021-05-02 NOTE — Therapy (Signed)
[facility] [address] [place], [place], [address] Phone: [phone]   Fax:  [phone]  Physical Therapy Treatment  Patient Details  Name: David Craig MRN: [id number] Date of Birth: 1985-07-19 Referring Provider (PT): Reesa Chew, [place]   Encounter Date: 05/01/2021   PT End of Session - 05/02/21 1149     Visit Number 8    Number of Visits 15   2x/week x 7 weeks + eval = 15 visits   Authorization Type BCBS    Authorization - Visit Number 7    Authorization - Number of Visits 15    PT Start Time 5859    PT Stop Time 1145    PT Time Calculation (min) 43 min    Equipment Utilized During Treatment Gait belt   Bioness   Activity Tolerance Patient tolerated treatment well    Behavior During Therapy [facility] for tasks assessed/performed   Decreased safety awareness noted            No past medical history on file.  Past Surgical History:  Procedure Laterality Date   BUBBLE STUDY  02/21/2021   Procedure: BUBBLE STUDY;  Surgeon: Skeet Latch, MD;  Location: [place];  Service: Cardiovascular;;   IR ANGIO INTRA EXTRACRAN SEL COM CAROTID INNOMINATE UNI L MOD SED  02/15/2021   IR ANGIO VERTEBRAL SEL VERTEBRAL BILAT MOD SED  02/15/2021   IR CT HEAD LTD  02/15/2021   IR PERCUTANEOUS ART THROMBECTOMY/INFUSION INTRACRANIAL INC DIAG ANGIO  02/15/2021   IR US GUIDE VASC ACCESS RIGHT  02/15/2021   RADIOLOGY WITH ANESTHESIA N/A 02/14/2021   Procedure: IR WITH ANESTHESIA;  Surgeon: Luanne Bras, MD;  Location: [place];  Service: Radiology;  Laterality: N/A;   TEE WITHOUT CARDIOVERSION N/A 02/21/2021   Procedure: TRANSESOPHAGEAL ECHOCARDIOGRAM (TEE);  Surgeon: Skeet Latch, MD;  Location: [place];  Service: Cardiovascular;  Laterality: N/A;    There were no vitals filed for this visit.   Subjective Assessment - 05/02/21 1143     Subjective Pt states he liked the pool exercise with OT on Monday; no changes or  issues reported    Pertinent History Rt PCA CVA on 02-14-21; h/o polysubstance abuse (cocaine, marijuana, oxycodone), h/o migraines, h/o Lt ankle sprain    Diagnostic tests CT and MRI  -  revealed Rt PCA infarcts    Patient Stated Goals "Doing what I used to do" - take care of my kids (ages 19 1/2 and 1 1/2)    Currently in Pain? No/denies             NeuroRe-ed:  Pt transferred from mat to floor with UE support on mat - with CGA; pt able to stand, turn around and transfer to tall kneeling position onto mat On floor; pt able to maintain balance in tall kneeling with SBA; pt performed Rt hip flexion/extension 5 reps and then Rt hip abdct/adduction in  Tall kneeling position to increase weight shift onto LLE and for Lt pelvic stabilization  Pt performed Lt 1/2 kneeling with RUE support on mat for balance with min assist for recovery of LOB; performed slow horizontal head turns 5 reps in Lt 1/2 kneeling position  Pt performed amb. On knees on floor in tall kneeling position - forward/back approx. 10' x 2 reps with UE support prn on mat table  Pt then transferred from floor (Rt 1/2 kneeling) to standing with SBA  [facility] Adult PT Treatment/Exercise - 05/02/21 0001       Transfers   Transfers Sit to Stand;Stand to Sit    Sit to Stand 4: Min guard    Stand to Sit 5: Supervision    Comments Rt foot placed on balance bubble for increased weight shift onto LLE during transfer      Ambulation/Gait   Ambulation/Gait Yes    Ambulation/Gait Assistance 4: Min assist    Ambulation/Gait Assistance Details pt wore AFO on LLE: tactile cues for Lt trunk elongation and Lt pelvic protraction during gait    Ambulation Distance (Feet) 230 Feet    Assistive device None    Gait Pattern Step-through pattern;Decreased stride length;Decreased hip/knee flexion - left;Decreased dorsiflexion - left;Trunk flexed;Poor foot clearance - left    Ambulation Surface Level;Indoor       Exercises   Exercises Knee/Hip      Knee/Hip Exercises: Stretches   Gastroc Stretch Left;2 reps   30 sec hold - passively - shoe and AFO doffed     Knee/Hip Exercises: Sidelying   Other Sidelying Knee/Hip Exercises Lt knee flexion with use of skateboard for increased ease and increased AROM with less friction; performed tapping on Lt hamstrings for increased facilitation; performed approx. 3 sets 10 reps with mod assist for full ROM of Lt knee flexion                       PT Short Term Goals - 05/02/21 1154       PT SHORT TERM GOAL #1   Title Independent in HEP for LLE strengthening and stretching.    Baseline reports independence with current HEP, will continue to benefit from progressive HEP    Time 3    Period Weeks    Status Achieved    Target Date 04/27/21      PT SHORT TERM GOAL #2   Title Pt will participate in assessment of Berg balance test.    Baseline Berg balance assessed on 11/23 scored 41/56    Time 3    Period Weeks    Status Achieved    Target Date 04/27/21      PT SHORT TERM GOAL #3   Title Pt will amb. with RW 350' with SBA on flat, even surface.    Baseline met 04-24-21    Time 3    Period Weeks    Status Achieved    Target Date 04/27/21      PT SHORT TERM GOAL #4   Title Pt will transfer wheelchair to mat independently without cues to lock brakes.    Baseline SBA but needed assistance with locking brakes; pt walked into clinic with RW (is not using wheelchair to access clinic) - 04-24-21    Time 3    Period Weeks    Status Deferred    Target Date 04/27/21               PT Long Term Goals - 05/02/21 1154       PT LONG TERM GOAL #1   Title Pt will amb. 250' with large- based quad cane with CGA.    Time 7    Period Weeks    Status New      PT LONG TERM GOAL #2   Title Pt will negotiate steps with 1 hand rail with SBA.    Time 7      PT LONG TERM GOAL #3   Title Improve Berg balance  score by at least 8 points to demo  improvement in balance and to reduce fall risk.    Time 7    Period Weeks    Status New      PT LONG TERM GOAL #4   Title Pt will stand for at least 5" without UE support and reach at least 10" outside BOS independently for increased independence with ADL's.    Time 7    Period Weeks    Status New      PT LONG TERM GOAL #5   Title Negotiate ramp and curb with LBQC with CGA.    Time 7    Period Weeks    Status New                   Plan - 05/02/21 1150     Clinical Impression Statement Pt is progressing well with gait with pt amb. 230' without device with tactile cues for incr. Lt pelvic protraction and Lt trunk elongation.  Pt has difficulty performing active Lt knee flexion due to significant Lt hamstring weakness.  Pt able to initiate Lt knee flexion and perform approx. 40 degrees active flexion but unable to actively flex Lt knee in final 1/3 ROM.  Cont with POC.    Personal Factors and Comorbidities Behavior Pattern;Comorbidity 2;Transportation;Profession    Comorbidities s/p R PCA CVA with Lt hemiparesis, polysubstance abuse, migraines, obesity, h/o Lt sprained ankle    Examination-Activity Limitations Bathing;Locomotion Level;Transfers;Bend;Caring for Others;Dressing;Lift;Stand;Stairs;Squat;Reach Overhead    Examination-Participation Restrictions Cleaning;Community Activity;Driving;Interpersonal Relationship;Laundry;Yard Work;Shop;Occupation;Meal Prep    Stability/Clinical Decision Making Evolving/Moderate complexity    Rehab Potential Good    PT Frequency 2x / week    PT Duration Other (comment)   7 weeks  (15 visit limit)   PT Treatment/Interventions ADLs/Self Care Home Management;DME Instruction;Gait training;Stair training;Therapeutic activities;Therapeutic exercise;Balance training;Neuromuscular re-education;Patient/family education;Passive range of motion;Visual/perceptual remediation/compensation    PT Next Visit Plan cont with Bioness - Tablet 1, quick fit  electrodes, R upper cuff for L hamstring.  Gait training - decrease reliance on RW; work on initiation of L swing phase and increased L stance phase/SLS.  Bioness in training mode for hamstring exercises.  Review and add to HEP for LLE. continue heel slides. Potential Berg Balance. reaching/weight shift activities.    Consulted and Agree with Plan of Care Patient             Patient will benefit from skilled therapeutic intervention in order to improve the following deficits and impairments:  Abnormal gait, Decreased activity tolerance, Decreased balance, Decreased safety awareness, Decreased coordination, Decreased strength, Impaired tone, Impaired vision/preception, Impaired UE functional use  Visit Diagnosis: Hemiplegia and hemiparesis following other cerebrovascular disease affecting left non-dominant side (HCC)  Other abnormalities of gait and mobility  Muscle weakness (generalized)     Problem List Patient Active Problem List   Diagnosis Date Noted   Sprain of left ankle    Sleep disturbance    Hyperglycemia    Hypoalbuminemia due to protein-calorie malnutrition (HCC)    Dyslipidemia    Hemiparesis affecting left side as late effect of stroke (HCC)    Lethargy    Cerebrovascular accident (CVA) due to occlusion of right posterior cerebral artery ([place]) 02/22/2021   Cerebrovascular accident (CVA) [facility])    Right carotid artery occlusion 02/14/2021    David Craig, [place], PT 05/02/2021, 11:56 AM  [facility] [address] [place], [place], [address] Phone: [phone]   Fax:  [phone]  Name: David Craig MRN: [id number] Date of Birth: 15-Sep-1985

## 2021-05-03 ENCOUNTER — Encounter: Payer: Self-pay | Admitting: Adult Health

## 2021-05-03 ENCOUNTER — Other Ambulatory Visit: Payer: Self-pay

## 2021-05-03 ENCOUNTER — Ambulatory Visit: Payer: BC Managed Care – PPO | Admitting: Occupational Therapy

## 2021-05-03 ENCOUNTER — Ambulatory Visit: Payer: Self-pay | Admitting: Occupational Therapy

## 2021-05-03 ENCOUNTER — Encounter: Payer: Self-pay | Admitting: Occupational Therapy

## 2021-05-03 ENCOUNTER — Encounter: Payer: Self-pay | Admitting: Physical Therapy

## 2021-05-03 ENCOUNTER — Ambulatory Visit (INDEPENDENT_AMBULATORY_CARE_PROVIDER_SITE_OTHER): Payer: BC Managed Care – PPO | Admitting: Adult Health

## 2021-05-03 ENCOUNTER — Ambulatory Visit: Payer: BC Managed Care – PPO | Attending: Physician Assistant | Admitting: Physical Therapy

## 2021-05-03 VITALS — BP 135/84 | HR 79 | Ht 69.0 in | Wt 240.0 lb

## 2021-05-03 DIAGNOSIS — R278 Other lack of coordination: Secondary | ICD-10-CM | POA: Insufficient documentation

## 2021-05-03 DIAGNOSIS — R41840 Attention and concentration deficit: Secondary | ICD-10-CM | POA: Insufficient documentation

## 2021-05-03 DIAGNOSIS — E785 Hyperlipidemia, unspecified: Secondary | ICD-10-CM

## 2021-05-03 DIAGNOSIS — R2681 Unsteadiness on feet: Secondary | ICD-10-CM

## 2021-05-03 DIAGNOSIS — Z720 Tobacco use: Secondary | ICD-10-CM

## 2021-05-03 DIAGNOSIS — H53462 Homonymous bilateral field defects, left side: Secondary | ICD-10-CM | POA: Diagnosis not present

## 2021-05-03 DIAGNOSIS — M6281 Muscle weakness (generalized): Secondary | ICD-10-CM | POA: Diagnosis not present

## 2021-05-03 DIAGNOSIS — G8114 Spastic hemiplegia affecting left nondominant side: Secondary | ICD-10-CM | POA: Diagnosis not present

## 2021-05-03 DIAGNOSIS — M25512 Pain in left shoulder: Secondary | ICD-10-CM | POA: Insufficient documentation

## 2021-05-03 DIAGNOSIS — I69854 Hemiplegia and hemiparesis following other cerebrovascular disease affecting left non-dominant side: Secondary | ICD-10-CM | POA: Diagnosis not present

## 2021-05-03 DIAGNOSIS — R29818 Other symptoms and signs involving the nervous system: Secondary | ICD-10-CM

## 2021-05-03 DIAGNOSIS — I63531 Cerebral infarction due to unspecified occlusion or stenosis of right posterior cerebral artery: Secondary | ICD-10-CM

## 2021-05-03 DIAGNOSIS — R2689 Other abnormalities of gait and mobility: Secondary | ICD-10-CM | POA: Insufficient documentation

## 2021-05-03 DIAGNOSIS — G479 Sleep disorder, unspecified: Secondary | ICD-10-CM

## 2021-05-03 MED ORDER — QUETIAPINE FUMARATE 50 MG PO TABS: 50.0000 mg | ORAL_TABLET | Freq: Every day | ORAL | 5 refills | Status: DC

## 2021-05-03 NOTE — Therapy (Signed)
[facility] [address] [place], [place], [address] Phone: [phone]   Fax:  [phone]  Physical Therapy Treatment  Patient Details  Name: David Craig MRN: [id number] Date of Birth: [birth year] Referring Provider (PT): Reesa Chew, [place]   Encounter Date: 05/03/2021   PT End of Session - 05/03/21 1650     Visit Number 9    Number of Visits 15   2x/week x 7 weeks + eval = 15 visits   Authorization Type BCBS    Authorization - Visit Number 8   Simultaneous filing. User may not have seen previous data.   Authorization - Number of Visits 15    PT Start Time 3382    PT Stop Time 1446    PT Time Calculation (min) 41 min    Equipment Utilized During Treatment Gait belt   Bioness   Activity Tolerance Patient tolerated treatment well    Behavior During Therapy [facility] for tasks assessed/performed   Decreased safety awareness noted            History reviewed. No pertinent past medical history.  Past Surgical History:  Procedure Laterality Date   BUBBLE STUDY  02/21/2021   Procedure: BUBBLE STUDY;  Surgeon: Skeet Latch, MD;  Location: [place];  Service: Cardiovascular;;   IR ANGIO INTRA EXTRACRAN SEL COM CAROTID INNOMINATE UNI L MOD SED  02/15/2021   IR ANGIO VERTEBRAL SEL VERTEBRAL BILAT MOD SED  02/15/2021   IR CT HEAD LTD  02/15/2021   IR PERCUTANEOUS ART THROMBECTOMY/INFUSION INTRACRANIAL INC DIAG ANGIO  02/15/2021   IR US GUIDE VASC ACCESS RIGHT  02/15/2021   RADIOLOGY WITH ANESTHESIA N/A 02/14/2021   Procedure: IR WITH ANESTHESIA;  Surgeon: Luanne Bras, MD;  Location: [place];  Service: Radiology;  Laterality: N/A;   TEE WITHOUT CARDIOVERSION N/A 02/21/2021   Procedure: TRANSESOPHAGEAL ECHOCARDIOGRAM (TEE);  Surgeon: Skeet Latch, MD;  Location: [place];  Service: Cardiovascular;  Laterality: N/A;    There were no vitals filed for this visit.   Subjective Assessment - 05/03/21 1406      Subjective Pt reports no new complaints, pain, or falls since last visit.    Pertinent History Rt PCA CVA on 02-14-21; h/o polysubstance abuse (cocaine, marijuana, oxycodone), h/o migraines, h/o Lt ankle sprain    Diagnostic tests CT and MRI  -  revealed Rt PCA infarcts    Patient Stated Goals "Doing what I used to do" - take care of my kids (ages 63 1/2 and 1 1/2)    Currently in Pain? No/denies    Pain Score 0-No pain               [facility] Adult PT Treatment/Exercise - 05/03/21 1408       Transfers   Transfers Sit to Stand;Stand to Sit    Sit to Stand 5: Supervision    Stand to Sit 5: Supervision      Ambulation/Gait   Ambulation/Gait Yes    Ambulation/Gait Assistance 4: Min assist    Ambulation/Gait Assistance Details pt had AFO on L. Ambulated throughout most of session with no device. Trialed gait training with the small based quad cane for 1 x around track. The pt continued to require cues to increase stance time on L. The pt ambulated in/out of session with RW.    Ambulation Distance (Feet) 200 Feet    Assistive device None;Small based quad cane;Rolling walker    Gait Pattern Step-through pattern;Decreased stride length;Decreased hip/knee flexion -  left;Decreased dorsiflexion - left;Trunk flexed;Poor foot clearance - left;Decreased stance time - left    Ambulation Surface Level;Indoor      Neuro Re-ed    Neuro Re-ed Details  Performed standing edge of mat with // stance: the pt performed reaching acoss and outside BOS to pick up and hand cones to therapists with R UE. The pt performed ~7 each direction. The pt was transitioned to tall kneeling on the mat with use of Kay bench in front for UE support: The pt performed mini squats x 10-15 with no UE support. The pt then performed stepping LE's back/forward x 10 each side with UE support. The pt required manual assist to maintain errct posture and prevent rotation with stepping with the L LE. The pt then performed stepping LE's out to  side and back in x 10 each side. The pt required manual assist to maintain errect posture and prevent lateral trunk lean to R. The pt transitioned to the counter and performed sidestepping at counter 3 x each direction with single UE support. The pt required min guard and cues to maintain LE's pointed forward and to increas hip/knee flexion when stepping with L LE.      Exercises   Exercises Knee/Hip      Knee/Hip Exercises: Stretches   Quad Stretch Left;2 reps;30 seconds    Quad Stretch Limitations prone passive quad stretch on L 2 x 30 sec      Knee/Hip Exercises: Supine   Bridges AAROM;Strengthening;Both;1 set;10 reps    Other Supine Knee/Hip Exercises B hamstring curls with LE's on red theraball x 10      Knee/Hip Exercises: Prone   Hamstring Curl 1 set;5 reps;Limitations    Hamstring Curl Limitations The pt performed AAROM hamstring curl x 5 on L. The pt required maual facilitation and assist to complete ROM    Hip Extension Strengthening;Left;1 set;5 reps    Hip Extension Limitations Pt performed hip ext with knee flexed on L x 5. The pt required max assist to maintain knee in flexion and min assist to complete ROM.               PT Short Term Goals - 05/02/21 1154       PT SHORT TERM GOAL #1   Title Independent in HEP for LLE strengthening and stretching.    Baseline reports independence with current HEP, will continue to benefit from progressive HEP    Time 3    Period Weeks    Status Achieved    Target Date 04/27/21      PT SHORT TERM GOAL #2   Title Pt will participate in assessment of Berg balance test.    Baseline Berg balance assessed on 11/23 scored 41/56    Time 3    Period Weeks    Status Achieved    Target Date 04/27/21      PT SHORT TERM GOAL #3   Title Pt will amb. with RW 350' with SBA on flat, even surface.    Baseline met 04-24-21    Time 3    Period Weeks    Status Achieved    Target Date 04/27/21      PT SHORT TERM GOAL #4   Title Pt will  transfer wheelchair to mat independently without cues to lock brakes.    Baseline SBA but needed assistance with locking brakes; pt walked into clinic with RW (is not using wheelchair to access clinic) - 04-24-21    Time 3  Period Weeks    Status Deferred    Target Date 04/27/21               PT Long Term Goals - 05/02/21 1154       PT LONG TERM GOAL #1   Title Pt will amb. 250' with large- based quad cane with CGA.    Time 7    Period Weeks    Status New      PT LONG TERM GOAL #2   Title Pt will negotiate steps with 1 hand rail with SBA.    Time 7      PT LONG TERM GOAL #3   Title Improve Berg balance score by at least 8 points to demo improvement in balance and to reduce fall risk.    Time 7    Period Weeks    Status New      PT LONG TERM GOAL #4   Title Pt will stand for at least 5" without UE support and reach at least 10" outside BOS independently for increased independence with ADL's.    Time 7    Period Weeks    Status New      PT LONG TERM GOAL #5   Title Negotiate ramp and curb with LBQC with CGA.    Time 7    Period Weeks    Status New               Plan - 05/03/21 1651     Clinical Impression Statement Today's skilled session was focused on exercises to improve weight shifting and LE strengthening. The pt performed all interventions well with no issues noted, though still remains limited in hamstring activation. The pt could continue to benefit from further skilled PT to address functional deficits.    Personal Factors and Comorbidities Behavior Pattern;Comorbidity 2;Transportation;Profession    Comorbidities s/p R PCA CVA with Lt hemiparesis, polysubstance abuse, migraines, obesity, h/o Lt sprained ankle    Examination-Activity Limitations Bathing;Locomotion Level;Transfers;Bend;Caring for Others;Dressing;Lift;Stand;Stairs;Squat;Reach Overhead    Examination-Participation Restrictions Cleaning;Community Activity;Driving;Interpersonal  Relationship;Laundry;Yard Work;Shop;Occupation;Meal Prep    Stability/Clinical Decision Making Evolving/Moderate complexity    Rehab Potential Good    PT Frequency 2x / week    PT Duration Other (comment)    PT Treatment/Interventions ADLs/Self Care Home Management;DME Instruction;Gait training;Stair training;Therapeutic activities;Therapeutic exercise;Balance training;Neuromuscular re-education;Patient/family education;Passive range of motion;Visual/perceptual remediation/compensation    PT Next Visit Plan cont with Bioness - Tablet 1, quick fit electrodes, R upper cuff for L hamstring.  Gait training - increase stance time on L; work on initiation of L swing phase. SLS/tapping activities in standing to improve weight shifting and hip/knee flexion on L.  Bioness in training mode for hamstring exercises.  Review and add to HEP for LLE. continue heel slides. Potential Berg Balance. stepping/reaching/weight shift activities.    Consulted and Agree with Plan of Care Patient             Patient will benefit from skilled therapeutic intervention in order to improve the following deficits and impairments:  Abnormal gait, Decreased activity tolerance, Decreased balance, Decreased safety awareness, Decreased coordination, Decreased strength, Impaired tone, Impaired vision/preception, Impaired UE functional use  Visit Diagnosis: Hemiplegia and hemiparesis following other cerebrovascular disease affecting left non-dominant side (HCC)  Muscle weakness (generalized)  Other abnormalities of gait and mobility     Problem List Patient Active Problem List   Diagnosis Date Noted   Sprain of left ankle    Sleep disturbance    Hyperglycemia  Hypoalbuminemia due to protein-calorie malnutrition (HCC)    Dyslipidemia    Hemiparesis affecting left side as late effect of stroke ([place])    Lethargy    Cerebrovascular accident (CVA) due to occlusion of right posterior cerebral artery ([place]) 02/22/2021    Cerebrovascular accident (CVA) [facility])    Right carotid artery occlusion 02/14/2021    Rondel Baton, SPTA 05/03/2021, 5:03 PM  [facility] [address] [place], [place], [address] Phone: [phone]   Fax:  [phone]  Name: David Craig MRN: [id number] Date of Birth: 1985/12/14

## 2021-05-03 NOTE — Progress Notes (Signed)
[name] [address] [place]. [place] [address] [phone]       HOSPITAL FOLLOW UP NOTE  [name] Date of Birth:  1986-02-03 Medical Record Number:  [id number]   Reason for Referral:  hospital stroke follow up    SUBJECTIVE:   CHIEF COMPLAINT:  Chief Complaint  Patient presents with   Follow-up    Rm 3 with spouse maggie Pt is well, has been having trouble sleeping, blurred vision and L sided weakness.     HPI:   David Craig is a 35 y.o. male with a past medical history significant for polysubstance abuse (cocaine, tobacco, vaping, oxycodone), and migraine headaches who presented on 02/14/2021 with left hemiparesis and right gaze preference.  Personally reviewed hospitalization pertinent progress notes, lab work and imaging.  Stroke work-up revealed right PCA infarcts (involving occipital, medial temporal, PLIC and ventrolateral thalamic regions) secondary to terminal right ICA occlusion s/p failed mechanical thrombectomy likely secondary to cocaine use/vasospasm. CTA questionable right ICA dissection but on cerebral angiogram no evidence of dissection. MRA post angio right P1 occlusion and right ICA occlusion.  EF 65 to 70% with no regional wall motion abnormalities and mild LVH.  TCD showed grade 1 PFO.  TEE small PFO with no evidence of thrombus.  LE Dopplers negative.  Hypercoagulable labs negative.  LDL 126.  A1c 5.0.  Recommended DAPT for 3 months then aspirin alone as well as starting atorvastatin 80 mg daily.  Polysubstance abuse (tobacco use, vaping, EtOH, THC and cocaine) cessation counseling provided (UDS + for THC and cocaine).  Residual deficits of right gaze preference, dense left homonymous hemianopia, dysphagia, left lower facial weakness and left hemiplegia.  Therapies recommended CIR for ongoing therapy needs.  Did well during CIR and discharged home on 03/22/2021 (28 day stay) with outpatient therapies.  Today, 05/03/2021, patient  being seen for initial hospital follow-up accompanied by his wife, David Craig.   Reports residual Left sided weakness - improving -ambulates with RW -denies any recent falls Vision blurred - at all times both eyes Left peripheral vision loss bilaterally Speaking clearly now but per wife, tone changed - lower - monotone No swallowing issues or cognitive/memory impairment Currently working with PT/OT.  Completed SLP 11/29 C/o sleeping difficulty - placed on Seroquel during CIR - he requests this be restarted as it was greatly beneficial.  He has been out of prescription for past 2 weeks and has noticed greater difficulty sleeping.  Previously on Seroquel 100 mg nightly with daytime grogginess  Currently on short-term disability -previously doing Investment banker, operational and appliance parts   Compliant on aspirin and Plavix as well as atorvastatin -denies side effects Blood pressure 135/84 -not routinely monitored at home is typically stable continued tobacco use approx 3 cigarettes per day THC use but only 2x since discharge to help him sleep No additional cocaine use and rare EtOH use  No further concerns at this time    PERTINENT IMAGING  Code Stroke CT head No acute abnormality.. ASPECTS 10.  CTA head & neck : questionable right ICA dissection at cavernous segment with more distal siphon occlusion, right proximal Pcomm and right proximal P2 occlusions  CT perfusion large area of penumbra without large core  Cerebral angio no discernable dissection flap but with decreased caliber right cervical ICA. Left to right perfusion of right sided MAC territory branches without MCA branch occlusion and no proximal occlusion of right PCA  MRI  multifocal ischemic infarcts of right PCA territory involving occipital,  medical temporal, posterior limb of internal capsule, and ventrolateral thalamic regions.  MRA , after angio right P1 occlusion , right Ica occlusion  2D Echo LVEF 65-70%, no regional wall motion  abnormalities, mild LVH  Transcranial Doppler study reveals grade 1 PFO Hypercoag labs NEG TEE : small PFO no evidence of thrombus Lower extremity venous Dopplers negative for DVT        ROS:   14 system review of systems performed and negative with exception of those listed in HPI  PMH: History reviewed. No pertinent past medical history.  PSH:  Past Surgical History:  Procedure Laterality Date   BUBBLE STUDY  02/21/2021   Procedure: BUBBLE STUDY;  Surgeon: Skeet Latch, MD;  Location: [facility];  Service: Cardiovascular;;   IR ANGIO INTRA EXTRACRAN SEL COM CAROTID INNOMINATE UNI L MOD SED  02/15/2021   IR ANGIO VERTEBRAL SEL VERTEBRAL BILAT MOD SED  02/15/2021   IR CT HEAD LTD  02/15/2021   IR PERCUTANEOUS ART THROMBECTOMY/INFUSION INTRACRANIAL INC DIAG ANGIO  02/15/2021   IR US GUIDE VASC ACCESS RIGHT  02/15/2021   RADIOLOGY WITH ANESTHESIA N/A 02/14/2021   Procedure: IR WITH ANESTHESIA;  Surgeon: Luanne Bras, MD;  Location: [facility];  Service: Radiology;  Laterality: N/A;   TEE WITHOUT CARDIOVERSION N/A 02/21/2021   Procedure: TRANSESOPHAGEAL ECHOCARDIOGRAM (TEE);  Surgeon: Skeet Latch, MD;  Location: [facility] ENDOSCOPY;  Service: Cardiovascular;  Laterality: N/A;    Social History:  Social History   Socioeconomic History   Marital status: Single    Spouse name: Not on file   Number of children: 2   Years of education: Not on file   Highest education level: Not on file  Occupational History   Not on file  Tobacco Use   Smoking status: Some Days    Types: Cigarettes    Last attempt to quit: 03/15/2015    Years since quitting: 6.1   Smokeless tobacco: Never  Vaping Use   Vaping Use: Former  Substance and Sexual Activity   Alcohol use: Yes    Comment: occasional   Drug use: Yes    Types: Cocaine, Marijuana, Oxycodone   Sexual activity: Yes    Partners: Female  Other Topics Concern   Not on file  Social History Narrative   Not on file   Social  Determinants of Health   Financial Resource Strain: Not on file  Food Insecurity: Not on file  Transportation Needs: Not on file  Physical Activity: Not on file  Stress: Not on file  Social Connections: Not on file  Intimate Partner Violence: Not on file    Family History: History reviewed. No pertinent family history.  Medications:   Current Outpatient Medications on File Prior to Visit  Medication Sig Dispense Refill   aspirin EC 325 MG EC tablet Take 1 tablet (325 mg total) by mouth daily. 30 tablet 0   atorvastatin (LIPITOR) 80 MG tablet Take 1 tablet (80 mg total) by mouth daily at 6 PM. 30 tablet 0   B Complex-C (B-COMPLEX WITH VITAMIN C) tablet Take 1 tablet by mouth daily. 30 tablet 0   clopidogrel (PLAVIX) 75 MG tablet Take 1 tablet (75 mg total) by mouth daily. 30 tablet 1   magnesium gluconate (MAGONATE) 500 MG tablet 1 tablet     melatonin 3 MG TABS tablet Take 3 mg by mouth as needed.     QUEtiapine (SEROQUEL) 100 MG tablet Take 1 tablet (100 mg total) by mouth at bedtime. 30 tablet  0   No current facility-administered medications on file prior to visit.    Allergies:  Not on File    OBJECTIVE:  Physical Exam  Vitals:   05/03/21 0911  BP: 135/84  Pulse: 79  Weight: 240 lb (108.9 kg)  Height: _0  (1.753 m)   Body mass index is 35.44 kg/m. No results found.  Post stroke PHQ 2/9 Depression screen PHQ 2/9 04/03/2021  Decreased Interest 0  Down, Depressed, Hopeless 0  PHQ - 2 Score 0  Altered sleeping 1  Tired, decreased energy 0  Change in appetite 0  Feeling bad or failure about yourself  0  Trouble concentrating 0  Moving slowly or fidgety/restless 2  Suicidal thoughts 0  PHQ-9 Score 3     General: well developed, well nourished, very pleasant middle-age Caucasian male, seated, in no evident distress Head: head normocephalic and atraumatic.   Neck: supple with no carotid or supraclavicular bruits Cardiovascular: regular rate and rhythm, no  murmurs Musculoskeletal: no deformity Skin:  no rash/petichiae Vascular:  Normal pulses all extremities   Neurologic Exam Mental Status: Awake and fully alert.  Fluent speech and language.  Oriented to place and time. Recent and remote memory intact. Attention span, concentration and fund of knowledge appropriate. Mood and affect appropriate.  Cranial Nerves: Fundoscopic exam reveals sharp disc margins. Pupils equal, briskly reactive to light. Extraocular movements full without nystagmus. Visual fields dense left homonymous hemianopia. Hearing intact. Facial sensation intact.  Left lower facial weakness.  Tongue, palate moves normally and symmetrically.  Motor: Normal strength, bulk and tone right upper and lower extremity LUE: deltoid 2/5, Elbow extension 3/5, elbow flexion 2/5, able to grip hand but unable to straighten without assistance; increased tone throughout LLE: HF 4/5, KE 4/5, KF 1/5, APF 3/5, ADF 1/5 with AFO brace in place. Mildly increased tone throughout Sensory.: intact to touch , pinprick , position and vibratory sensation.  Coordination: Rapid alternating movements normal on right side. Finger-to-nose and heel-to-shin performed accurately on right side. Gait and Station: Arises from chair without difficulty. Stance is normal. Gait demonstrates hemiplegic gait with use of RW and AFO brace.  Tandem walk and heel toe not attempted Reflexes: 2+ LUE and LLE     NIHSS  8 Modified Rankin  3      ASSESSMENT: David Craig is a 35 y.o. year old male right PCA infarcts (involving occipital, medial temporal, PLIC and ventrolateral thalamic regions) on 02/14/2021 secondary to terminal right ICA occlusion s/p failed mechanical thrombectomy likely secondary to cocaine use/vasospasm. Vascular risk factors include polysubstance abuse (cocaine, THC, opiates, vaping and EtOH use), migraine headaches, and HLD.      PLAN:  Multifocal right PCA infarcts:  Residual deficit: Left  hemiparesis with spasticity and left homonymous hemianopia.  Has been making excellent recovery.  Continue working with PT/OT. Scheduled ophthalmology evaluation at [facility] clinic next week.  Continued use of RW at all times unless otherwise instructed Altered sleep: Recommend resuming Seroquel at 50 mg nightly - advised to contact office or PMR if continues difficulty sleeping or experiencing daytime grogginess for need of dosage adjustment Continue aspirin 325 mg daily and clopidogrel 75 mg daily for total of 3 months then asa alone and continue atorvastatin 80 mg daily for secondary stroke prevention.   Discussed secondary stroke prevention measures and importance of close PCP follow up for aggressive stroke risk factor management. I have gone over the pathophysiology of stroke, warning signs and symptoms, risk factors and their management  in some detail with instructions to go to the closest emergency room for symptoms of concern. HLD: LDL goal <70. Recent LDL 126.  Continue atorvastatin 80 mg daily managed by PCP Tobacco use: greatly decreased daily amount with continued 3 cigarettes/day.  Discussed importance of complete tobacco cessation as continued use greatly increases risk of additional strokes.  He is advised to discuss further with PCP if assistance is needed in quitting Cocaine use: No additional use since discharge.  Discussed importance of completely avoiding any additional use    Follow up in 3 months or call earlier if needed   CC:  [facility] provider: Dr. Leonie Man PCP: Scifres, Earlie Server, PA-C    I spent 59 minutes of face-to-face and non-face-to-face time with patient and wife.  This included previsit chart review including review of recent hospitalization, lab review, study review, order entry, electronic health record documentation, patient and wife education regarding recent stroke including etiology, secondary stroke prevention measures and importance of managing stroke risk factors,  residual deficits and typical recovery time and answered all other questions to patient and wife's satisfaction  Frann Rider, AGNP-BC  [facility] [address] [place], [place] [address]  Phone [phone] Fax [phone] Note: This document was prepared with digital dictation and possible smart phrase technology. Any transcriptional errors that result from this process are unintentional.

## 2021-05-03 NOTE — Therapy (Signed)
[facility] [address] [place], [place], [address] Phone: [phone]   Fax:  [phone]  Occupational Therapy Treatment  Patient Details  Name: David Craig MRN: [id number] Date of Birth: 20-May-1986 Referring Provider (OT): Marissa Nestle   Encounter Date: 05/03/2021   OT End of Session - 05/03/21 1514     Visit Number 8    Number of Visits 15    Date for OT Re-Evaluation 05/30/21    Authorization Type BCBS    Authorization Time Period VL: 30 (PT,OT)    Authorization - Visit Number 7    Authorization - Number of Visits 15    OT Start Time 1315    OT Stop Time 1400    OT Time Calculation (min) 45 min    Activity Tolerance Patient tolerated treatment well    Behavior During Therapy [facility] for tasks assessed/performed             History reviewed. No pertinent past medical history.  Past Surgical History:  Procedure Laterality Date   BUBBLE STUDY  02/21/2021   Procedure: BUBBLE STUDY;  Surgeon: Chilton Si, MD;  Location: [facility] ENDOSCOPY;  Service: Cardiovascular;;   IR ANGIO INTRA EXTRACRAN SEL COM CAROTID INNOMINATE UNI L MOD SED  02/15/2021   IR ANGIO VERTEBRAL SEL VERTEBRAL BILAT MOD SED  02/15/2021   IR CT HEAD LTD  02/15/2021   IR PERCUTANEOUS ART THROMBECTOMY/INFUSION INTRACRANIAL INC DIAG ANGIO  02/15/2021   IR US GUIDE VASC ACCESS RIGHT  02/15/2021   RADIOLOGY WITH ANESTHESIA N/A 02/14/2021   Procedure: IR WITH ANESTHESIA;  Surgeon: Julieanne Cotton, MD;  Location[facility];  Service: Radiology;  Laterality: N/A;   TEE WITHOUT CARDIOVERSION N/A 02/21/2021   Procedure: TRANSESOPHAGEAL ECHOCARDIOGRAM (TEE);  Surgeon: Chilton Si, MD;  Location: [facility] ENDOSCOPY;  Service: Cardiovascular;  Laterality: N/A;    There were no vitals filed for this visit.   Subjective Assessment - 05/03/21 1318     Subjective  Patient reports that he did not feel too tired after pool session.    Pertinent History PMH:  polysubstance abuse/tobacco, migrane headaches and obesity    Limitations Fall Risk, Impulsive, L hemi    Currently in Pain? No/denies    Pain Score 0-No pain                          OT Treatments/Exercises (OP) - 05/03/21 1457       Neurological Re-education Exercises   Other Exercises 1 Worked on postural control to address upright posture and weight shift toward left.  Patient tends to drift back toward right without prompts/facilitation.  Worked on low reaching patterns - early control of shoulder, elbow, forearm, hand.  Worked on assisted grasp and release patterns.  Patient with improved ability to reduce shoulder elevation, and activate elbow extension.  Patient able to reduce tension in hand to allow assisted pinch/release.    Other Exercises 2 Carried over similar low reach patterns in standing.  Working to improve alignment and activation through LLE and foot.  Patient with improved activation in LUE consistently when aligned onto LLE.  Worked to isolate elbow flex/ext, forearm pro/supination, and shaping hand to different sizes and widths.                      OT Short Term Goals - 05/03/21 1515       OT SHORT TERM GOAL #1   Title Pt  will be independent with initial HEP    Time 4    Period Weeks    Status On-going    Target Date 04/25/21      OT SHORT TERM GOAL #2   Title Pt will report pain no greater than 7/10 with HEP    Baseline 8/10 with shoulder flexion/abduction    Time 4    Period Weeks    Status On-going      OT SHORT TERM GOAL #3   Title Pt will verbalize and demonstrate understanding of visual scanning startegies for visual field cut.    Baseline left visual field cut    Time 4    Period Weeks    Status On-going      OT SHORT TERM GOAL #4   Title Pt will perform environmental scanning with 90% accuracy or greater    Time 4    Period Weeks    Status Achieved   100% on 04/25/21              OT Long Term Goals -  05/03/21 1515       OT LONG TERM GOAL #1   Title Pt will be independent with updated HEP    Time 9    Period Weeks    Status On-going      OT LONG TERM GOAL #2   Title Pt will perform table top scanning with 95% accuracy or greater with use of visual scanning strategies.    Baseline 89% accuracy with 88's    Time 9    Period Weeks    Status On-going      OT LONG TERM GOAL #3   Title Pt will demonstrate ability to grasp and release cylindrical object and/or 1 inch block with LUE.    Baseline trace volitional composite flexion    Time 9    Period Weeks    Status On-going      OT LONG TERM GOAL #4   Title Pt will demonstrate active LUE movement to reach to low level surface for progression to reaching and obtaining objects    Baseline no active flexion    Time 9    Period Weeks    Status On-going      OT LONG TERM GOAL #5   Title PT will demonsrate using LUE as active and functional stabilizer for 20% or greater of ADLs and IADLs.    Baseline 0%    Time 9    Period Weeks    Status On-going                   Plan - 05/03/21 1514     Clinical Impression Statement Pt continues to show steady improvement in functional mobility and activation of LUE.    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;Decreased knowledge of use of DME;Vision;Sensation;Flexibility;Coordination;IADL;ROM;UE functional use;Tone;Pain;Strength;GMC;Dexterity    Cognitive Skills Attention;Safety Awareness    Rehab Potential Good    Clinical Decision Making Limited treatment options, no task modification necessary    Comorbidities Affecting Occupational Performance: None    Modification or Assistance to Complete Evaluation  No modification of tasks or assist  necessary to complete eval    OT Frequency 2x / week    OT Duration Other (comment)   15 visits  over 9 weeks d/t any scheduling conflicts   OT Treatment/Interventions Self-care/ADL training;Aquatic Therapy;Electrical Stimulation;Energy conservation;Manual Therapy;Patient/family education;Visual/perceptual remediation/compensation;Passive range of motion;Neuromuscular education;Building services engineer;Therapeutic exercise;Cognitive remediation/compensation;Moist Heat;Fluidtherapy;DME and/or AE instruction;Splinting;Therapeutic activities;Ultrasound    Plan HEP for self PROM for LUE, Neuroreed of LUE, aquatics for postural control, functional mobility, and NMR LUE    Consulted and Agree with Plan of Care Patient    Family Member Consulted spouse             Patient will benefit from skilled therapeutic intervention in order to improve the following deficits and impairments:   Body Structure / Function / Physical Skills: ADL, Decreased knowledge of use of DME, Vision, Sensation, Flexibility, Coordination, IADL, ROM, UE functional use, Tone, Pain, Strength, GMC, Dexterity Cognitive Skills: Attention, Safety Awareness     Visit Diagnosis: Hemiplegia and hemiparesis following other cerebrovascular disease affecting left non-dominant side (HCC)  Muscle weakness (generalized)  Acute pain of left shoulder  Attention and concentration deficit  Other lack of coordination  Other symptoms and signs involving the nervous system  Unsteadiness on feet    Problem List Patient Active Problem List   Diagnosis Date Noted   Sprain of left ankle    Sleep disturbance    Hyperglycemia    Hypoalbuminemia due to protein-calorie malnutrition (HCC)    Dyslipidemia    Hemiparesis affecting left side as late effect of stroke (HCC)    Lethargy    Cerebrovascular accident (CVA) due to occlusion of right posterior cerebral artery (HCC) 02/22/2021   Cerebrovascular accident (CVA) (HCC)    Right  carotid artery occlusion 02/14/2021    Collier Salina, OT/L 05/03/2021, 3:16 PM  [facility] [address] [place], [place], [address] Phone: [phone]   Fax:  [phone]  Name: Elyon Zoll MRN: [id number] Date of Birth: 05-Apr-1986

## 2021-05-03 NOTE — Patient Instructions (Addendum)
Continue aspirin 325 mg daily and clopidogrel 75 mg daily until mid-end December (unable current prescription runs out) and then aspirin alone  and continue atorvastatin  for secondary stroke prevention  Continue to follow up with PCP regarding cholesterol and blood pressure management  Maintain strict control of hypertension with blood pressure goal below 130/90 and cholesterol with LDL cholesterol (bad cholesterol) goal below 70 mg/dL.   Continue working with therapies for likely ongoing recovery  Restart Seroquel at 50mg  nightly - please let me know if dosage needs to be adjusted  Please ensure you follow up with your PCP regarding tobacco use - continued use greatly increases risk of additional strokes. Do not use any additional cocaine as this also greatly increases risk of strokes     Followup in the future with me in 3 months or call earlier if needed       Thank you for coming to see at [facility]. I hope we have been able to provide you high quality care today.  You may receive a patient satisfaction survey over the next few weeks. We would appreciate your feedback and comments so that we may continue to improve ourselves and the health of our patients.   Stroke Prevention Some medical conditions and behaviors can lead to a higher chance of having a stroke. You can help prevent a stroke by eating healthy, exercising, not smoking, and managing any medical conditions you have. Stroke is a leading cause of functional impairment. Primary prevention is particularly important because a majority of strokes are first-time events. Stroke changes the lives of not only those who experience a stroke but also their family and other caregivers. How can this condition affect me? A stroke is a medical emergency and should be treated right away. A stroke can lead to brain damage and can sometimes be life-threatening. If a person gets medical treatment right away, there is  a better chance of surviving and recovering from a stroke. What can increase my risk? The following medical conditions may increase your risk of a stroke: Cardiovascular disease. High blood pressure (hypertension). Diabetes. High cholesterol. Sickle cell disease. Blood clotting disorders (hypercoagulable state). Obesity. Sleep disorders (obstructive sleep apnea). Other risk factors include: Being older than age 49. Having a history of blood clots, stroke, or mini-stroke (transient ischemic attack, TIA). Genetic factors, such as race, ethnicity, or a family history of stroke. Smoking cigarettes or using other tobacco products. Taking birth control pills, especially if you also use tobacco. Heavy use of alcohol or drugs, especially cocaine and methamphetamine. Physical inactivity. What actions can I take to prevent this? Manage your health conditions High cholesterol levels. Eating a healthy diet is important for preventing high cholesterol. If cholesterol cannot be managed through diet alone, you may need to take medicines. Take any prescribed medicines to control your cholesterol as told by your health care provider. Hypertension. To reduce your risk of stroke, try to keep your blood pressure below 130/80. Eating a healthy diet and exercising regularly are important for controlling blood pressure. If these steps are not enough to manage your blood pressure, you may need to take medicines. Take any prescribed medicines to control hypertension as told by your health care provider. Ask your health care provider if you should monitor your blood pressure at home. Have your blood pressure checked every year, even if your blood pressure is normal. Blood pressure increases with age and some medical conditions. Diabetes. Eating a healthy diet and exercising regularly  are important parts of managing your blood sugar (glucose). If your blood sugar cannot be managed through diet and exercise, you  may need to take medicines. Take any prescribed medicines to control your diabetes as told by your health care provider. Get evaluated for obstructive sleep apnea. Talk to your health care provider about getting a sleep evaluation if you snore a lot or have excessive sleepiness. Make sure that any other medical conditions you have, such as atrial fibrillation or atherosclerosis, are managed. Nutrition Follow instructions from your health care provider about what to eat or drink to help manage your health condition. These instructions may include: Reducing your daily calorie intake. Limiting how much salt (sodium) you use to 1,500 milligrams (mg) each day. Using only healthy fats for cooking, such as olive oil, canola oil, or sunflower oil. Eating healthy foods. You can do this by: Choosing foods that are high in fiber, such as whole grains, and fresh fruits and vegetables. Eating at least 5 servings of fruits and vegetables a day. Try to fill one-half of your plate with fruits and vegetables at each meal. Choosing lean protein foods, such as lean cuts of meat, poultry without skin, fish, tofu, beans, and nuts. Eating low-fat dairy products. Avoiding foods that are high in sodium. This can help lower blood pressure. Avoiding foods that have saturated fat, trans fat, and cholesterol. This can help prevent high cholesterol. Avoiding processed and prepared foods. Counting your daily carbohydrate intake.  Lifestyle If you drink alcohol: Limit how much you have to: 0-1 drink a day for women who are not pregnant. 0-2 drinks a day for men. Know how much alcohol is in your drink. In the [place] one drink equals one 12 oz bottle of beer ( ), one 5 oz glass of wine ( ), or one 1 oz glass of hard liquor (11mL). Do not use any products that contain nicotine or tobacco. These products include cigarettes, chewing tobacco, and vaping devices, such as e-cigarettes. If you need help quitting, ask your  health care provider. Avoid secondhand smoke. Do not use drugs. Activity  Try to stay at a healthy weight. Get at least 30 minutes of exercise on most days, such as: Fast walking. Biking. Swimming. Medicines Take over-the-counter and prescription medicines only as told by your health care provider. Aspirin or blood thinners (antiplatelets or anticoagulants) may be recommended to reduce your risk of forming blood clots that can lead to stroke. Avoid taking birth control pills. Talk to your health care provider about the risks of taking birth control pills if: You are over 35 years old. You smoke. You get very bad headaches. You have had a blood clot. Where to find more information American Stroke Association: www.strokeassociation.org Get help right away if: You or a loved one has any symptoms of a stroke. "BE FAST" is an easy way to remember the main warning signs of a stroke: B - Balance. Signs are dizziness, sudden trouble walking, or loss of balance. E - Eyes. Signs are trouble seeing or a sudden change in vision. F - Face. Signs are sudden weakness or numbness of the face, or the face or eyelid drooping on one side. A - Arms. Signs are weakness or numbness in an arm. This happens suddenly and usually on one side of the body. S - Speech. Signs are sudden trouble speaking, slurred speech, or trouble understanding what people say. T - Time. Time to call emergency services. Write down what time symptoms started. You or a  loved one has other signs of a stroke, such as: A sudden, severe headache with no known cause. Nausea or vomiting. Seizure. These symptoms may represent a serious problem that is an emergency. Do not wait to see if the symptoms will go away. Get medical help right away. Call your local emergency services (911 in the [place] Do not drive yourself to the hospital. Summary You can help to prevent a stroke by eating healthy, exercising, not smoking, limiting alcohol  intake, and managing any medical conditions you may have. Do not use any products that contain nicotine or tobacco. These include cigarettes, chewing tobacco, and vaping devices, such as e-cigarettes. If you need help quitting, ask your health care provider. Remember "BE FAST" for warning signs of a stroke. Get help right away if you or a loved one has any of these signs. This information is not intended to replace advice given to you by your health care provider. Make sure you discuss any questions you have with your health care provider. Document Revised: 12/20/2019 Document Reviewed: 12/20/2019 Elsevier Patient Education  2022 [employer].

## 2021-05-07 ENCOUNTER — Encounter: Payer: Self-pay | Admitting: Occupational Therapy

## 2021-05-07 ENCOUNTER — Encounter: Payer: BC Managed Care – PPO | Admitting: Occupational Therapy

## 2021-05-07 ENCOUNTER — Ambulatory Visit: Payer: BC Managed Care – PPO

## 2021-05-07 ENCOUNTER — Ambulatory Visit: Payer: BC Managed Care – PPO | Admitting: Occupational Therapy

## 2021-05-07 ENCOUNTER — Other Ambulatory Visit: Payer: Self-pay

## 2021-05-07 DIAGNOSIS — R29818 Other symptoms and signs involving the nervous system: Secondary | ICD-10-CM | POA: Diagnosis not present

## 2021-05-07 DIAGNOSIS — M25512 Pain in left shoulder: Secondary | ICD-10-CM

## 2021-05-07 DIAGNOSIS — R2681 Unsteadiness on feet: Secondary | ICD-10-CM

## 2021-05-07 DIAGNOSIS — R41840 Attention and concentration deficit: Secondary | ICD-10-CM

## 2021-05-07 DIAGNOSIS — I69854 Hemiplegia and hemiparesis following other cerebrovascular disease affecting left non-dominant side: Secondary | ICD-10-CM

## 2021-05-07 DIAGNOSIS — M6281 Muscle weakness (generalized): Secondary | ICD-10-CM

## 2021-05-07 DIAGNOSIS — R278 Other lack of coordination: Secondary | ICD-10-CM

## 2021-05-07 DIAGNOSIS — R2689 Other abnormalities of gait and mobility: Secondary | ICD-10-CM | POA: Diagnosis not present

## 2021-05-07 NOTE — Therapy (Signed)
[facility] [address] [place], [place], [address] Phone: [phone]   Fax:  [phone]  Occupational Therapy Treatment  Patient Details  Name: David Craig MRN: [id number] Date of Birth: Oct 16, 1985 Referring Provider (OT): Marissa Nestle   Encounter Date: 05/07/2021   OT End of Session - 05/07/21 1821     Visit Number 9    Number of Visits 15    Date for OT Re-Evaluation 05/30/21    Authorization Type BC[employer]    Authorization Time Period VL: 30 (PT,OT)    Authorization - Visit Number 8    Authorization - Number of Visits 15    OT Start Time 1500    OT Stop Time 1545    OT Time Calculation (min) 45 min    Activity Tolerance Patient tolerated treatment well    Behavior During Therapy [facility] for tasks assessed/performed             History reviewed. No pertinent past medical history.  Past Surgical History:  Procedure Laterality Date   BUBBLE STUDY  02/21/2021   Procedure: BUBBLE STUDY;  Surgeon: Chilton Si, MD;  Location: [facility] ENDOSCOPY;  Service: Cardiovascular;;   IR ANGIO INTRA EXTRACRAN SEL COM CAROTID INNOMINATE UNI L MOD SED  02/15/2021   IR ANGIO VERTEBRAL SEL VERTEBRAL BILAT MOD SED  02/15/2021   IR CT HEAD LTD  02/15/2021   IR PERCUTANEOUS ART THROMBECTOMY/INFUSION INTRACRANIAL INC DIAG ANGIO  02/15/2021   IR US GUIDE VASC ACCESS RIGHT  02/15/2021   RADIOLOGY WITH ANESTHESIA N/A 02/14/2021   Procedure: IR WITH ANESTHESIA;  Surgeon: Julieanne Cotton, MD;  Location: [facility] OR;  Service: Radiology;  Laterality: N/A;   TEE WITHOUT CARDIOVERSION N/A 02/21/2021   Procedure: TRANSESOPHAGEAL ECHOCARDIOGRAM (TEE);  Surgeon: Chilton Si, MD;  Location: [facility] ENDOSCOPY;  Service: Cardiovascular;  Laterality: N/A;    There were no vitals filed for this visit.   Subjective Assessment - 05/07/21 1820     Subjective  Patient reports that he is attempting to place left heel down and stand tall even when walking with  his walker.    Patient is accompanied by: Family member    Pertinent History PMH: polysubstance abuse/tobacco, migrane headaches and obesity    Limitations Fall Risk, Impulsive, L hemi    Patient Stated Goals "get back to normal self" and to "be able to walk to bathroom and pee and pick up my kids".    Currently in Pain? No/denies    Pain Score 0-No pain              Patient seen for aquatic therapy visit.  Patient entered and exited pool via stairs and mod facilitation/ min assist.   Session occurred in 3.5-4.0 ft of water.   Initially worked on improving alignment of trunk and LLE during standing and stepping (mid to late stance LLE)   Patient needs assist for alignment of foot on floor - tends toward inversion/ plantar flexion.  Responded well to cueing for upright walking - hip and trunk extension with foot aligned on pool floor, prior to stepping forward with right foot.   Worked on long arm weight bearing through left hand.  Mod facilitation/assist to align left shoulder and elbow.  Patient with strong internal rotation component.  With initial alignment patient able to actively extend left elbow.  Patient without report of hand or wrist pain.  Worked in modified plantigrade to address active support through LUE in gravity assisted mid to high  reach patterns - closed chain.                         OT Short Term Goals - 05/07/21 1823       OT SHORT TERM GOAL #1   Title Pt will be independent with initial HEP    Time 4    Period Weeks    Status On-going    Target Date 04/25/21      OT SHORT TERM GOAL #2   Title Pt will report pain no greater than 7/10 with HEP    Baseline 8/10 with shoulder flexion/abduction    Time 4    Period Weeks    Status On-going      OT SHORT TERM GOAL #3   Title Pt will verbalize and demonstrate understanding of visual scanning startegies for visual field cut.    Baseline left visual field cut    Time 4    Period Weeks     Status On-going      OT SHORT TERM GOAL #4   Title Pt will perform environmental scanning with 90% accuracy or greater    Time 4    Period Weeks    Status Achieved   100% on 04/25/21              OT Long Term Goals - 05/07/21 1824       OT LONG TERM GOAL #1   Title Pt will be independent with updated HEP    Time 9    Period Weeks    Status On-going      OT LONG TERM GOAL #2   Title Pt will perform table top scanning with 95% accuracy or greater with use of visual scanning strategies.    Baseline 89% accuracy with 88's    Time 9    Period Weeks    Status On-going      OT LONG TERM GOAL #3   Title Pt will demonstrate ability to grasp and release cylindrical object and/or 1 inch block with LUE.    Baseline trace volitional composite flexion    Time 9    Period Weeks    Status On-going      OT LONG TERM GOAL #4   Title Pt will demonstrate active LUE movement to reach to low level surface for progression to reaching and obtaining objects    Baseline no active flexion    Time 9    Period Weeks    Status On-going      OT LONG TERM GOAL #5   Title PT will demonsrate using LUE as active and functional stabilizer for 20% or greater of ADLs and IADLs.    Baseline 0%    Time 9    Period Weeks    Status On-going                   Plan - 05/07/21 1822     Clinical Impression Statement Pt continues to show steady progress toward OT goals.  Patient is benefiting from both clinic and aquatic environments.    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;Decreased knowledge of use of DME;Vision;Sensation;Flexibility;Coordination;IADL;ROM;UE functional use;Tone;Pain;Strength;GMC;Dexterity    Cognitive Skills Attention;Safety Awareness     Rehab Potential Good  Clinical Decision Making Limited treatment options, no task modification necessary    Comorbidities Affecting Occupational Performance: None    Modification or Assistance to Complete Evaluation  No modification of tasks or assist necessary to complete eval    OT Frequency 2x / week    OT Duration Other (comment)   15 visits over 9 weeks d/t any scheduling conflicts   OT Treatment/Interventions Self-care/ADL training;Aquatic Therapy;Electrical Stimulation;Energy conservation;Manual Therapy;Patient/family education;Visual/perceptual remediation/compensation;Passive range of motion;Neuromuscular education;Building services engineer;Therapeutic exercise;Cognitive remediation/compensation;Moist Heat;Fluidtherapy;DME and/or AE instruction;Splinting;Therapeutic activities;Ultrasound    Plan HEP for self PROM for LUE, Neuroreed of LUE, aquatics for postural control, functional mobility, and NMR LUE    Consulted and Agree with Plan of Care Patient    Family Member Consulted spouse             Patient will benefit from skilled therapeutic intervention in order to improve the following deficits and impairments:   Body Structure / Function / Physical Skills: ADL, Decreased knowledge of use of DME, Vision, Sensation, Flexibility, Coordination, IADL, ROM, UE functional use, Tone, Pain, Strength, GMC, Dexterity Cognitive Skills: Attention, Safety Awareness     Visit Diagnosis: Hemiplegia and hemiparesis following other cerebrovascular disease affecting left non-dominant side (HCC)  Muscle weakness (generalized)  Acute pain of left shoulder  Attention and concentration deficit  Other lack of coordination  Other symptoms and signs involving the nervous system  Unsteadiness on feet    Problem List Patient Active Problem List   Diagnosis Date Noted   Sprain of left ankle    Sleep disturbance    Hyperglycemia    Hypoalbuminemia due to protein-calorie  malnutrition (HCC)    Dyslipidemia    Hemiparesis affecting left side as late effect of stroke (HCC)    Lethargy    Cerebrovascular accident (CVA) due to occlusion of right posterior cerebral artery (HCC) 02/22/2021   Cerebrovascular accident (CVA) (HCC)    Right carotid artery occlusion 02/14/2021    Collier Salina, OT 05/07/2021, 6:25 PM  [facility] [address] [place], [place], [address] Phone: [phone]   Fax:  [phone]  Name: David Craig MRN: [id number] Date of Birth: 29-Jun-1985

## 2021-05-09 ENCOUNTER — Ambulatory Visit: Payer: BC Managed Care – PPO | Admitting: Occupational Therapy

## 2021-05-09 ENCOUNTER — Ambulatory Visit: Payer: BC Managed Care – PPO

## 2021-05-09 ENCOUNTER — Encounter: Payer: Self-pay | Admitting: Occupational Therapy

## 2021-05-09 ENCOUNTER — Other Ambulatory Visit: Payer: Self-pay

## 2021-05-09 DIAGNOSIS — M25512 Pain in left shoulder: Secondary | ICD-10-CM | POA: Diagnosis not present

## 2021-05-09 DIAGNOSIS — R278 Other lack of coordination: Secondary | ICD-10-CM

## 2021-05-09 DIAGNOSIS — M6281 Muscle weakness (generalized): Secondary | ICD-10-CM

## 2021-05-09 DIAGNOSIS — R2681 Unsteadiness on feet: Secondary | ICD-10-CM | POA: Diagnosis not present

## 2021-05-09 DIAGNOSIS — R41840 Attention and concentration deficit: Secondary | ICD-10-CM | POA: Diagnosis not present

## 2021-05-09 DIAGNOSIS — R29818 Other symptoms and signs involving the nervous system: Secondary | ICD-10-CM

## 2021-05-09 DIAGNOSIS — I69854 Hemiplegia and hemiparesis following other cerebrovascular disease affecting left non-dominant side: Secondary | ICD-10-CM | POA: Diagnosis not present

## 2021-05-09 DIAGNOSIS — R2689 Other abnormalities of gait and mobility: Secondary | ICD-10-CM

## 2021-05-09 NOTE — Therapy (Signed)
[facility] [address] [place], [place], [address] Phone: [phone]   Fax:  [phone]  Physical Therapy Treatment  Patient Details  Name: David Craig MRN: [id number] Date of Birth: May 09, 1986 Referring Provider (PT): Reesa Chew, [place]   Encounter Date: 05/09/2021   PT End of Session - 05/09/21 1317     Visit Number 10    Number of Visits 15   2x/week x 7 weeks + eval = 15 visits   Authorization Type BCBS    Authorization - Visit Number 9    Authorization - Number of Visits 15    PT Start Time 2800    PT Stop Time 1358    PT Time Calculation (min) 43 min    Equipment Utilized During Treatment Gait belt   Bioness   Activity Tolerance Patient tolerated treatment well    Behavior During Therapy [facility] for tasks assessed/performed   Decreased safety awareness noted            No past medical history on file.  Past Surgical History:  Procedure Laterality Date   BUBBLE STUDY  02/21/2021   Procedure: BUBBLE STUDY;  Surgeon: Skeet Latch, MD;  Location: [place];  Service: Cardiovascular;;   IR ANGIO INTRA EXTRACRAN SEL COM CAROTID INNOMINATE UNI L MOD SED  02/15/2021   IR ANGIO VERTEBRAL SEL VERTEBRAL BILAT MOD SED  02/15/2021   IR CT HEAD LTD  02/15/2021   IR PERCUTANEOUS ART THROMBECTOMY/INFUSION INTRACRANIAL INC DIAG ANGIO  02/15/2021   IR US GUIDE VASC ACCESS RIGHT  02/15/2021   RADIOLOGY WITH ANESTHESIA N/A 02/14/2021   Procedure: IR WITH ANESTHESIA;  Surgeon: Luanne Bras, MD;  Location: [place];  Service: Radiology;  Laterality: N/A;   TEE WITHOUT CARDIOVERSION N/A 02/21/2021   Procedure: TRANSESOPHAGEAL ECHOCARDIOGRAM (TEE);  Surgeon: Skeet Latch, MD;  Location: [place];  Service: Cardiovascular;  Laterality: N/A;    There were no vitals filed for this visit.   Subjective Assessment - 05/09/21 1316     Subjective No new changes/complaints. "my arm is stupid". No falls. No pain.     Pertinent History Rt PCA CVA on 02-14-21; h/o polysubstance abuse (cocaine, marijuana, oxycodone), h/o migraines, h/o Lt ankle sprain    Diagnostic tests CT and MRI  -  revealed Rt PCA infarcts    Patient Stated Goals "Doing what I used to do" - take care of my kids (ages 37 1/2 and 1 1/2)    Currently in Pain? No/denies                               [facility] Adult PT Treatment/Exercise - 05/09/21 0001       Transfers   Transfers Sit to Stand;Stand to Sit    Sit to Stand 5: Supervision    Stand to Sit 5: Supervision    Number of Reps 10 reps;1 set    Comments with LLE positioned posterior completed sit <> stands x 15 reps working on improved upright posture upon standing and no UE support.      Ambulation/Gait   Ambulation/Gait Yes    Ambulation/Gait Assistance 4: Min guard    Ambulation/Gait Assistance Details completed ambulation with SBQC x 200 ft with AFO donned, then transitioned to gait training with Bioness. Completed 2 x 115 ft with SBQC with bioness stimultaing L ant tib. unable to complete with on hamstring due to attire. patient demo good knee control  today with no assitance required from PT, just intermittent cues.    Ambulation Distance (Feet) 200 Feet   x1, 115 x 2 w/ bioness   Assistive device Small based quad cane    Gait Pattern Step-through pattern;Decreased stride length;Decreased hip/knee flexion - left;Decreased dorsiflexion - left;Trunk flexed;Poor foot clearance - left;Decreased stance time - left    Ambulation Surface Level;Indoor      Neuro Re-ed    Neuro Re-ed Details  Standing with concurrent bioness on the LLE at ant tib, completed steps forward with LLE to target and then anterior weight shift onto LE x 15 reps, PT providing cues for improved weight shift, posture. Notable change with posterior step back to midline due to hip extension/hamstring weakness. CGA from PT.      Modalities   Modalities Magazine features editor Location Bioness functional e-stim to L anterior tib    Electrical Stimulation Parameters see Tablet 1; quick fit lower electrodes    Electrical Stimulation Goals Strength;Neuromuscular facilitation                       PT Short Term Goals - 05/03/21 1704       PT SHORT TERM GOAL #1   Title Independent in HEP for LLE strengthening and stretching.    Baseline reports independence with current HEP, will continue to benefit from progressive HEP    Time 3    Period Weeks    Status Achieved    Target Date 04/27/21      PT SHORT TERM GOAL #2   Title Pt will participate in assessment of Berg balance test.    Baseline Berg balance assessed on 11/23 scored 41/56    Time 3    Period Weeks    Status Achieved    Target Date 04/27/21      PT SHORT TERM GOAL #3   Title Pt will amb. with RW 350' with SBA on flat, even surface.    Baseline met 04-24-21    Time 3    Period Weeks    Status Achieved    Target Date 04/27/21      PT SHORT TERM GOAL #4   Title Pt will transfer wheelchair to mat independently without cues to lock brakes.    Baseline SBA but needed assistance with locking brakes; pt walked into clinic with RW (is not using wheelchair to access clinic) - 04-24-21    Time 3    Period Weeks    Status Deferred    Target Date 04/27/21               PT Long Term Goals - 05/03/21 1704       PT LONG TERM GOAL #1   Title Pt will amb. 250' with large- based quad cane with CGA.    Time 7    Period Weeks    Status New      PT LONG TERM GOAL #2   Title Pt will negotiate steps with 1 hand rail with SBA.    Time 7      PT LONG TERM GOAL #3   Title Improve Berg balance score by at least 8 points to demo improvement in balance and to reduce fall risk.    Time 7    Period Weeks    Status New      PT LONG TERM GOAL #4   Title Pt will stand  for at least 5" without UE support and reach at least 10" outside BOS independently  for increased independence with ADL's.    Time 7    Period Weeks    Status New      PT LONG TERM GOAL #5   Title Negotiate ramp and curb with LBQC with CGA.    Time 7    Period Weeks    Status New                   Plan - 05/09/21 1542     Clinical Impression Statement Continued focused on gait training today with concurrent use of Bioness only on Ant Tib. Without hamstring stimulation, patietn demo good control of knee today with intermittent verbal cues. Continued NMR focusing on improved weight shift/stance to carryover into ambulation. Will continue per POC.    Personal Factors and Comorbidities Behavior Pattern;Comorbidity 2;Transportation;Profession    Comorbidities s/p R PCA CVA with Lt hemiparesis, polysubstance abuse, migraines, obesity, h/o Lt sprained ankle    Examination-Activity Limitations Bathing;Locomotion Level;Transfers;Bend;Caring for Others;Dressing;Lift;Stand;Stairs;Squat;Reach Overhead    Examination-Participation Restrictions Cleaning;Community Activity;Driving;Interpersonal Relationship;Laundry;Yard Work;Shop;Occupation;Meal Prep    Stability/Clinical Decision Making Evolving/Moderate complexity    Rehab Potential Good    PT Frequency 2x / week    PT Duration Other (comment)    PT Treatment/Interventions ADLs/Self Care Home Management;DME Instruction;Gait training;Stair training;Therapeutic activities;Therapeutic exercise;Balance training;Neuromuscular re-education;Patient/family education;Passive range of motion;Visual/perceptual remediation/compensation    PT Next Visit Plan cont with Bioness - Tablet 1, quick fit electrodes, R upper cuff for L hamstring.  Gait training - increase stance time on L; work on initiation of L swing phase. SLS/tapping activities in standing to improve weight shifting and hip/knee flexion on L.  Bioness in training mode for hamstring exercises.  Review and add to HEP for LLE. continue heel slides. Potential Berg Balance.  stepping/reaching/weight shift activities.    Consulted and Agree with Plan of Care Patient             Patient will benefit from skilled therapeutic intervention in order to improve the following deficits and impairments:  Abnormal gait, Decreased activity tolerance, Decreased balance, Decreased safety awareness, Decreased coordination, Decreased strength, Impaired tone, Impaired vision/preception, Impaired UE functional use  Visit Diagnosis: Other abnormalities of gait and mobility  Muscle weakness (generalized)  Unsteadiness on feet     Problem List Patient Active Problem List   Diagnosis Date Noted   Sprain of left ankle    Sleep disturbance    Hyperglycemia    Hypoalbuminemia due to protein-calorie malnutrition (HCC)    Dyslipidemia    Hemiparesis affecting left side as late effect of stroke (HCC)    Lethargy    Cerebrovascular accident (CVA) due to occlusion of right posterior cerebral artery ([place]) 02/22/2021   Cerebrovascular accident (CVA) ([place])    Right carotid artery occlusion 02/14/2021    Jones Bales, PT, DPT 05/09/2021, 3:44 PM  [facility] [address] [place], [place], [address] Phone: [phone]   Fax:  [phone]  Name: David Craig MRN: [id number] Date of Birth: 1985-10-16

## 2021-05-09 NOTE — Therapy (Signed)
[facility] [address] [place], [place], [address] Phone: [phone]   Fax:  [phone]  Occupational Therapy Treatment  Patient Details  Name: David Craig MRN: [id number] Date of Birth: 1986/01/17 Referring Provider (OT): Marissa Nestle   Encounter Date: 05/09/2021   OT End of Session - 05/09/21 1231     Visit Number 10    Number of Visits 15    Date for OT Re-Evaluation 05/30/21    Authorization Type BCBS    Authorization Time Period VL: 30 (PT,OT)    Authorization - Visit Number 9    Authorization - Number of Visits 15    OT Start Time 1230    OT Stop Time 1315    OT Time Calculation (min) 45 min    Activity Tolerance Patient tolerated treatment well    Behavior During Therapy [facility] for tasks assessed/performed             History reviewed. No pertinent past medical history.  Past Surgical History:  Procedure Laterality Date   BUBBLE STUDY  02/21/2021   Procedure: BUBBLE STUDY;  Surgeon: Chilton Si, MD;  Location: [facility] ENDOSCOPY;  Service: Cardiovascular;;   IR ANGIO INTRA EXTRACRAN SEL COM CAROTID INNOMINATE UNI L MOD SED  02/15/2021   IR ANGIO VERTEBRAL SEL VERTEBRAL BILAT MOD SED  02/15/2021   IR CT HEAD LTD  02/15/2021   IR PERCUTANEOUS ART THROMBECTOMY/INFUSION INTRACRANIAL INC DIAG ANGIO  02/15/2021   IR US GUIDE VASC ACCESS RIGHT  02/15/2021   RADIOLOGY WITH ANESTHESIA N/A 02/14/2021   Procedure: IR WITH ANESTHESIA;  Surgeon: Julieanne Cotton, MD;  [facility];  Service: Radiology;  Laterality: N/A;   TEE WITHOUT CARDIOVERSION N/A 02/21/2021   Procedure: TRANSESOPHAGEAL ECHOCARDIOGRAM (TEE);  Surgeon: Chilton Si, MD;  Location: [facility] END[facility];  Service: Cardiovascular;  Laterality: N/A;    There were no vitals filed for this visit.   Subjective Assessment - 05/09/21 1231     Subjective  Pt denies any changes denies any pain.    Pertinent History PMH: polysubstance abuse/tobacco,  migrane headaches and obesity    Limitations Fall Risk, Impulsive, L hemi    Patient Stated Goals "get back to normal self" and to "be able to walk to bathroom and pee and pick up my kids".    Currently in Pain? No/denies    Pain Score 0-No pain              Reviewed self PROM HEP  Neuromuscular Reeducation with sidelying onto L side with arm extended under body for increased stabilization in isolating supination/pronation and elbow flexion and extension. Pt with minimal tricep activation and forearm pronation but activation at forearm for supination and bicep flexion not present today.   Weight bearing in LUE for inhibition of tone and facilitation of muscle activation while reaching with RUE to R side for slight modified push up in LUE x 10 reps with min assistance and support.  UE Ranger in sitting with LUE for gravity eliminated work for forward reaching and pulling back, place and holds and horizontal abduction, elbow flexion and extension. Pt req'd total assistance for movements and holding positions this day.   Set patient up for next pool appt at 3:45 - pt verbalized understanding. Will cancel OT appt for Monday, 12/12 on land and have patient attend aquatic session.                    OT Education - 05/09/21 1236  Education Details reviewed and reprinted self PROM supine HEP    Person(s) Educated Patient    Methods Explanation;Demonstration    Comprehension Verbalized understanding;Need further instruction;Returned demonstration              OT Short Term Goals - 05/09/21 1236       OT SHORT TERM GOAL #1   Title Pt will be independent with initial HEP    Time 4    Period Weeks    Status Achieved   demonstrated supine self PROM HEP 05/09/21   Target Date 04/25/21      OT SHORT TERM GOAL #2   Title Pt will report pain no greater than 7/10 with HEP    Baseline 8/10 with shoulder flexion/abduction    Time 4    Period Weeks    Status Achieved    pain reports no pain with supine HEP     OT SHORT TERM GOAL #3   Title Pt will verbalize and demonstrate understanding of visual scanning startegies for visual field cut.    Baseline left visual field cut    Time 4    Period Weeks    Status Achieved      OT SHORT TERM GOAL #4   Title Pt will perform environmental scanning with 90% accuracy or greater    Time 4    Period Weeks    Status Achieved   100% on 04/25/21              OT Long Term Goals - 05/07/21 1824       OT LONG TERM GOAL #1   Title Pt will be independent with updated HEP    Time 9    Period Weeks    Status On-going      OT LONG TERM GOAL #2   Title Pt will perform table top scanning with 95% accuracy or greater with use of visual scanning strategies.    Baseline 89% accuracy with 88's    Time 9    Period Weeks    Status On-going      OT LONG TERM GOAL #3   Title Pt will demonstrate ability to grasp and release cylindrical object and/or 1 inch block with LUE.    Baseline trace volitional composite flexion    Time 9    Period Weeks    Status On-going      OT LONG TERM GOAL #4   Title Pt will demonstrate active LUE movement to reach to low level surface for progression to reaching and obtaining objects    Baseline no active flexion    Time 9    Period Weeks    Status On-going      OT LONG TERM GOAL #5   Title PT will demonsrate using LUE as active and functional stabilizer for 20% or greater of ADLs and IADLs.    Baseline 0%    Time 9    Period Weeks    Status On-going                   Plan - 05/09/21 1331     Clinical Impression Statement Pt continues to progress towards goals and benefitting from clinic and aquatic therapy. Pt with decreased over activation of LUE shoulder, chest and head turns for tapping into LUE movements.    OT Occupational Profile and History Problem Focused Assessment - Including review of records relating to presenting problem    Occupational performance  deficits (Please refer  to evaluation for details): IADL's;ADL's;Leisure;Work    Games developer / Function / Physical Skills ADL;Decreased knowledge of use of DME;Vision;Sensation;Flexibility;Coordination;IADL;ROM;UE functional use;Tone;Pain;Strength;GMC;Dexterity    Cognitive Skills Attention;Safety Awareness    Rehab Potential Good    Clinical Decision Making Limited treatment options, no task modification necessary    Comorbidities Affecting Occupational Performance: None    Modification or Assistance to Complete Evaluation  No modification of tasks or assist necessary to complete eval    OT Frequency 2x / week    OT Duration Other (comment)   15 visits over 9 weeks d/t any scheduling conflicts   OT Treatment/Interventions Self-care/ADL training;Aquatic Therapy;Electrical Stimulation;Energy conservation;Manual Therapy;Patient/family education;Visual/perceptual remediation/compensation;Passive range of motion;Neuromuscular education;Building services engineer;Therapeutic exercise;Cognitive remediation/compensation;Moist Heat;Fluidtherapy;DME and/or AE instruction;Splinting;Therapeutic activities;Ultrasound    Plan HEP for self PROM for LUE, Neuroreed of LUE, aquatics for postural control, functional mobility, and NMR LUE    Consulted and Agree with Plan of Care Patient    Family Member Consulted spouse             Patient will benefit from skilled therapeutic intervention in order to improve the following deficits and impairments:   Body Structure / Function / Physical Skills: ADL, Decreased knowledge of use of DME, Vision, Sensation, Flexibility, Coordination, IADL, ROM, UE functional use, Tone, Pain, Strength, GMC, Dexterity Cognitive Skills: Attention, Safety Awareness     Visit Diagnosis: Hemiplegia and hemiparesis following other cerebrovascular disease affecting left non-dominant side (HCC)  Muscle weakness (generalized)  Acute pain of left shoulder  Attention and  concentration deficit  Other lack of coordination  Other symptoms and signs involving the nervous system  Unsteadiness on feet    Problem List Patient Active Problem List   Diagnosis Date Noted   Sprain of left ankle    Sleep disturbance    Hyperglycemia    Hypoalbuminemia due to protein-calorie malnutrition (HCC)    Dyslipidemia    Hemiparesis affecting left side as late effect of stroke (HCC)    Lethargy    Cerebrovascular accident (CVA) due to occlusion of right posterior cerebral artery (HCC) 02/22/2021   Cerebrovascular accident (CVA) [facility])    Right carotid artery occlusion 02/14/2021    Junious Dresser, OT 05/09/2021, 1:33 PM  [facility] [address] [place], [place], [address] Phone: [phone]   Fax:  [phone]  Name: David Craig MRN: [id number] Date of Birth: 01-19-1986

## 2021-05-09 NOTE — Patient Instructions (Signed)
While lying down, clasp hands together and reach towards headboard for a stretch for your shoulder. Repeat 10 times and do 2-3 times a day.   While lying down, clasp hands together and reach side to side for a stretch for your shoulder. Repeat 10 times and do 2-3 times a day.   While lying down, clasp hands together and reach up towards the ceiling for a stretch for your shoulder. Repeat 10 times and do 2-3 times a day.

## 2021-05-14 ENCOUNTER — Ambulatory Visit: Payer: BC Managed Care – PPO

## 2021-05-14 ENCOUNTER — Ambulatory Visit: Payer: BC Managed Care – PPO | Admitting: Occupational Therapy

## 2021-05-14 ENCOUNTER — Encounter: Payer: Self-pay | Admitting: Occupational Therapy

## 2021-05-14 ENCOUNTER — Other Ambulatory Visit: Payer: Self-pay

## 2021-05-14 ENCOUNTER — Telehealth: Payer: Self-pay | Admitting: Adult Health

## 2021-05-14 DIAGNOSIS — R278 Other lack of coordination: Secondary | ICD-10-CM | POA: Diagnosis not present

## 2021-05-14 DIAGNOSIS — M6281 Muscle weakness (generalized): Secondary | ICD-10-CM

## 2021-05-14 DIAGNOSIS — I69854 Hemiplegia and hemiparesis following other cerebrovascular disease affecting left non-dominant side: Secondary | ICD-10-CM

## 2021-05-14 DIAGNOSIS — R2681 Unsteadiness on feet: Secondary | ICD-10-CM | POA: Diagnosis not present

## 2021-05-14 DIAGNOSIS — R2689 Other abnormalities of gait and mobility: Secondary | ICD-10-CM

## 2021-05-14 DIAGNOSIS — M25512 Pain in left shoulder: Secondary | ICD-10-CM | POA: Diagnosis not present

## 2021-05-14 DIAGNOSIS — R41840 Attention and concentration deficit: Secondary | ICD-10-CM | POA: Diagnosis not present

## 2021-05-14 DIAGNOSIS — R29818 Other symptoms and signs involving the nervous system: Secondary | ICD-10-CM | POA: Diagnosis not present

## 2021-05-14 MED ORDER — QUETIAPINE FUMARATE 50 MG PO TABS: 75.0000 mg | ORAL_TABLET | Freq: Every day | ORAL | 5 refills | Status: DC

## 2021-05-14 NOTE — Addendum Note (Signed)
Addended by: Ihor Austin L on: 05/14/2021 03:15 PM   Modules accepted: Orders

## 2021-05-14 NOTE — Therapy (Signed)
[facility] [address] [place], [place], [address] Phone: [phone]   Fax:  [phone]  Occupational Therapy Treatment  Patient Details  Name: David Craig MRN: [id number] Date of Birth: 1986/03/27 Referring Provider (OT): Marissa Nestle   Encounter Date: 05/14/2021   OT End of Session - 05/14/21 1850     Visit Number 11    Number of Visits 15    Date for OT Re-Evaluation 05/30/21    Authorization Type BCBS    Authorization Time Period VL: 30 (PT,OT)    Authorization - Visit Number 10    Authorization - Number of Visits 15    OT Start Time 1535    OT Stop Time 1635    OT Time Calculation (min) 60 min    Activity Tolerance Patient tolerated treatment well    Behavior During Therapy [facility] for tasks assessed/performed             History reviewed. No pertinent past medical history.  Past Surgical History:  Procedure Laterality Date   BUBBLE STUDY  02/21/2021   Procedure: BUBBLE STUDY;  Surgeon: Chilton Si, MD;  Location: [facility] ENDOSCOPY;  Service: Cardiovascular;;   IR ANGIO INTRA EXTRACRAN SEL COM CAROTID INNOMINATE UNI L MOD SED  02/15/2021   IR ANGIO VERTEBRAL SEL VERTEBRAL BILAT MOD SED  02/15/2021   IR CT HEAD LTD  02/15/2021   IR PERCUTANEOUS ART THROMBECTOMY/INFUSION INTRACRANIAL INC DIAG ANGIO  02/15/2021   IR US GUIDE VASC ACCESS RIGHT  02/15/2021   RADIOLOGY WITH ANESTHESIA N/A 02/14/2021   Procedure: IR WITH ANESTHESIA;  Surgeon: Julieanne Cotton, MD;  Location: [facility] OR;  Service: Radiology;  Laterality: N/A;   TEE WITHOUT CARDIOVERSION N/A 02/21/2021   Procedure: TRANSESOPHAGEAL ECHOCARDIOGRAM (TEE);  Surgeon: Chilton Si, MD;  Location: [facility] ENDOSCOPY;  Service: Cardiovascular;  Laterality: N/A;    There were no vitals filed for this visit.   Subjective Assessment - 05/14/21 1849     Subjective  Patient reports that he only has shoulder pain now when he gets tangled in shirt, or his arm falls  from raised position.    Patient is accompanied by: Family member    Pertinent History PMH: polysubstance abuse/tobacco, migrane headaches and obesity    Limitations Fall Risk, Impulsive, L hemi    Patient Stated Goals "get back to normal self" and to "be able to walk to bathroom and pee and pick up my kids".    Currently in Pain? No/denies    Pain Score 0-No pain            Patient seen for aquatic therapy visit.  Patient entered and exited pool via stairs and mod assist.   Session occurred in 3.5-4.5 ft of water.   Patient continues to have difficulty achieving heel strike with left foot - ankle plantar flexes and inverts.  By controlling for speed and step length, patient better able to achieve foot on floor with min facilitation consistently.  If allowed to have one hand on pool wall - able to better achieve alignment in LLE.  Worked to increase alignment in standing and stepping in reduced gravity environment of pool.  Patient with significant trunk flexion and rotation in his attempts to bring left foot to ground.  Improving with facilitation of alignment and repetition.   Worked on weight bearing on LUE/LLE in modified plantigrade position.  Patient able to tolerate increased wrist / digit extension with facilitation of body over arm.  Patient needs assist  for weight shift left to unweight R limbs.   Worked on modified prone float to address supported mid to high reach in antigravity condition.  Patient hopes to continue in pool throughout his plan of care.                         OT Short Term Goals - 05/14/21 1853       OT SHORT TERM GOAL #1   Title Pt will be independent with initial HEP    Time 4    Period Weeks    Status Achieved   demonstrated supine self PROM HEP 05/09/21   Target Date 04/25/21      OT SHORT TERM GOAL #2   Title Pt will report pain no greater than 7/10 with HEP    Baseline 8/10 with shoulder flexion/abduction    Time 4    Period  Weeks    Status Achieved   pain reports no pain with supine HEP     OT SHORT TERM GOAL #3   Title Pt will verbalize and demonstrate understanding of visual scanning startegies for visual field cut.    Baseline left visual field cut    Time 4    Period Weeks    Status Achieved      OT SHORT TERM GOAL #4   Title Pt will perform environmental scanning with 90% accuracy or greater    Time 4    Period Weeks    Status Achieved   100% on 04/25/21              OT Long Term Goals - 05/14/21 1853       OT LONG TERM GOAL #1   Title Pt will be independent with updated HEP    Time 9    Period Weeks    Status On-going      OT LONG TERM GOAL #2   Title Pt will perform table top scanning with 95% accuracy or greater with use of visual scanning strategies.    Baseline 89% accuracy with 88's    Time 9    Period Weeks    Status On-going      OT LONG TERM GOAL #3   Title Pt will demonstrate ability to grasp and release cylindrical object and/or 1 inch block with LUE.    Baseline trace volitional composite flexion    Time 9    Period Weeks    Status On-going      OT LONG TERM GOAL #4   Title Pt will demonstrate active LUE movement to reach to low level surface for progression to reaching and obtaining objects    Baseline no active flexion    Time 9    Period Weeks    Status On-going      OT LONG TERM GOAL #5   Title PT will demonsrate using LUE as active and functional stabilizer for 20% or greater of ADLs and IADLs.    Baseline 0%    Time 9    Period Weeks    Status On-going                   Plan - 05/14/21 1851     Clinical Impression Statement Pt shows some ability to isolate effective movement patterns when he slows down - does well in aquatic environment where he can move with less effort.    OT Occupational Profile and History Problem Focused Assessment - Including review  of records relating to presenting problem    Occupational performance deficits  (Please refer to evaluation for details): IADL's;ADL's;Leisure;Work    Games developer / Function / Physical Skills ADL;Decreased knowledge of use of DME;Vision;Sensation;Flexibility;Coordination;IADL;ROM;UE functional use;Tone;Pain;Strength;GMC;Dexterity    Cognitive Skills Attention;Safety Awareness    Rehab Potential Good    Clinical Decision Making Limited treatment options, no task modification necessary    Comorbidities Affecting Occupational Performance: None    Modification or Assistance to Complete Evaluation  No modification of tasks or assist necessary to complete eval    OT Frequency 2x / week    OT Duration Other (comment)   15 visits over 9 weeks d/t any scheduling conflicts   OT Treatment/Interventions Self-care/ADL training;Aquatic Therapy;Electrical Stimulation;Energy conservation;Manual Therapy;Patient/family education;Visual/perceptual remediation/compensation;Passive range of motion;Neuromuscular education;Building services engineer;Therapeutic exercise;Cognitive remediation/compensation;Moist Heat;Fluidtherapy;DME and/or AE instruction;Splinting;Therapeutic activities;Ultrasound    Plan HEP for self PROM for LUE, Neuroreed of LUE, aquatics for postural control, functional mobility, and NMR LUE    Consulted and Agree with Plan of Care Patient    Family Member Consulted spouse             Patient will benefit from skilled therapeutic intervention in order to improve the following deficits and impairments:   Body Structure / Function / Physical Skills: ADL, Decreased knowledge of use of DME, Vision, Sensation, Flexibility, Coordination, IADL, ROM, UE functional use, Tone, Pain, Strength, GMC, Dexterity Cognitive Skills: Attention, Safety Awareness     Visit Diagnosis: Hemiplegia and hemiparesis following other cerebrovascular disease affecting left non-dominant side (HCC)  Other lack of coordination  Muscle weakness (generalized)  Unsteadiness on  feet    Problem List Patient Active Problem List   Diagnosis Date Noted   Sprain of left ankle    Sleep disturbance    Hyperglycemia    Hypoalbuminemia due to protein-calorie malnutrition (HCC)    Dyslipidemia    Hemiparesis affecting left side as late effect of stroke (HCC)    Lethargy    Cerebrovascular accident (CVA) due to occlusion of right posterior cerebral artery (HCC) 02/22/2021   Cerebrovascular accident (CVA) (HCC)    Right carotid artery occlusion 02/14/2021    Collier Salina, OT 05/14/2021, 6:53 PM  [facility] [address] [place], [place], [address] Phone: [phone]   Fax:  [phone]  Name: David Craig MRN: [id number] Date of Birth: [birth year]

## 2021-05-14 NOTE — Telephone Encounter (Signed)
Okay to try Seroquel 75 mg nightly (1.5 tabs). New rx sent to pharmacy but he can split his current 50mg  tabs to take 1.5 tabs at night. After 2 weeks, please let know and we can consider increasing further

## 2021-05-14 NOTE — Telephone Encounter (Signed)
PHONE RM CAN RELAY  Contacted pt, LVM rq call back  Shanda Bumps recommends increase on Seroquel 75 mg nightly (1.5 tabs). New rx sent to pharmacy but he can split his current 50mg  tabs to take 1.5 tabs at night.  After 2 weeks on 75mg , please let know how you are doing and we can consider increasing further.

## 2021-05-14 NOTE — Telephone Encounter (Signed)
Are you able to increase? Please advise

## 2021-05-14 NOTE — Telephone Encounter (Signed)
Pt has called to report that the current does of: is no longer helping.  Pt would like to know if it is an option to cut the tablets for an increase or if a stronger dose can be called in.

## 2021-05-14 NOTE — Therapy (Signed)
[facility] [address] [place], [place], [address] Phone: [phone]   Fax:  [phone]  Physical Therapy Treatment  Patient Details  Name: David Craig MRN: [id number] Date of Birth: 13-Jul-1985 Referring Provider (PT): Reesa Chew, [place]   Encounter Date: 05/14/2021   PT End of Session - 05/14/21 1403     Visit Number 11    Number of Visits 15   2x/week x 7 weeks + eval = 15 visits   Authorization Type BCBS    Authorization - Visit Number 10    Authorization - Number of Visits 15    PT Start Time 1400    PT Stop Time 7322    PT Time Calculation (min) 42 min    Equipment Utilized During Treatment Gait belt   Bioness   Activity Tolerance Patient tolerated treatment well    Behavior During Therapy [facility] for tasks assessed/performed   Decreased safety awareness noted            History reviewed. No pertinent past medical history.  Past Surgical History:  Procedure Laterality Date   BUBBLE STUDY  02/21/2021   Procedure: BUBBLE STUDY;  Surgeon: Skeet Latch, MD;  Location: [place];  Service: Cardiovascular;;   IR ANGIO INTRA EXTRACRAN SEL COM CAROTID INNOMINATE UNI L MOD SED  02/15/2021   IR ANGIO VERTEBRAL SEL VERTEBRAL BILAT MOD SED  02/15/2021   IR CT HEAD LTD  02/15/2021   IR PERCUTANEOUS ART THROMBECTOMY/INFUSION INTRACRANIAL INC DIAG ANGIO  02/15/2021   IR US GUIDE VASC ACCESS RIGHT  02/15/2021   RADIOLOGY WITH ANESTHESIA N/A 02/14/2021   Procedure: IR WITH ANESTHESIA;  Surgeon: Luanne Bras, MD;  Location: [place];  Service: Radiology;  Laterality: N/A;   TEE WITHOUT CARDIOVERSION N/A 02/21/2021   Procedure: TRANSESOPHAGEAL ECHOCARDIOGRAM (TEE);  Surgeon: Skeet Latch, MD;  Location:  [place];  Service: Cardiovascular;  Laterality: N/A;    There were no vitals filed for this visit.   Subjective Assessment - 05/14/21 1404     Subjective No changes. No pain. No falls. reports hasnt  been putting his L hand in the orthotic on the RW.    Pertinent History Rt PCA CVA on 02-14-21; h/o polysubstance abuse (cocaine, marijuana, oxycodone), h/o migraines, h/o Lt ankle sprain    Diagnostic tests CT and MRI  -  revealed Rt PCA infarcts    Patient Stated Goals "Doing what I used to do" - take care of my kids (ages 84 1/2 and 1 1/2)    Currently in Pain? No/denies              [facility] Adult PT Treatment/Exercise - 05/14/21 0001       Transfers   Transfers Sit to Stand;Stand to Sit    Sit to Stand 5: Supervision    Stand to Sit 5: Supervision      Ambulation/Gait   Ambulation/Gait Yes    Ambulation/Gait Assistance 4: Min guard    Ambulation/Gait Assistance Details completed ambulation with SBQC x 230 ft, x 2 reps with concurrent Bioness. Bioness stimultaing L ant tib. unable to complete with on hamstring due to attire. PT providing tactile cues for improved weight shift, focus on slowed pace to promote weight acceptance/stance on LLE. Intermittent cues for upright posture and standing tall, engaging glutes to maintain hip extension.    Ambulation Distance (Feet) 230 Feet   x 2   Assistive device Small based quad cane    Gait Pattern Step-through pattern;Decreased stride  length;Decreased hip/knee flexion - left;Decreased dorsiflexion - left;Trunk flexed;Poor foot clearance - left;Decreased stance time - left    Ambulation Surface Level;Indoor    Gait Comments patient continues to ambulate into session with use of RW, not utilizing LUE orthotic. PT educating on purchase options for [facility] for use at home. Patient verbalized understanding of purchase options.      Neuro Re-ed    Neuro Re-ed Details  completed standing <> floor transfer onto red mat x 2 reps. Once on floor, completed tall kneeling working on maintaining upright posture and hip extension. PT providing target x 10 reps on L having patient reach with RUE across midline to target to promote improved weight shift and proximal  hip strengthening. Then with blue theraband around hips completed resisted hip hinges x 15 reps, cues for upright posture throughout.      Exercises   Exercises Knee/Hip    Other Exercises  seated at EOM completed heel slides x 2 x 5 reps with towel under LLE focused on improved hamstring activation/strength. also cues for focus on control with eccentric movement.      Modalities   Modalities Teacher, English as a foreign language Location Bioness functional e-stim to L anterior tib    Electrical Stimulation Parameters see Tablet 1; quick fit lower electrodes    Electrical Stimulation Goals Strength;Neuromuscular facilitation                       PT Short Term Goals - 05/03/21 1704       PT SHORT TERM GOAL #1   Title Independent in HEP for LLE strengthening and stretching.    Baseline reports independence with current HEP, will continue to benefit from progressive HEP    Time 3    Period Weeks    Status Achieved    Target Date 04/27/21      PT SHORT TERM GOAL #2   Title Pt will participate in assessment of Berg balance test.    Baseline Berg balance assessed on 11/23 scored 41/56    Time 3    Period Weeks    Status Achieved    Target Date 04/27/21      PT SHORT TERM GOAL #3   Title Pt will amb. with RW 350' with SBA on flat, even surface.    Baseline met 04-24-21    Time 3    Period Weeks    Status Achieved    Target Date 04/27/21      PT SHORT TERM GOAL #4   Title Pt will transfer wheelchair to mat independently without cues to lock brakes.    Baseline SBA but needed assistance with locking brakes; pt walked into clinic with RW (is not using wheelchair to access clinic) - 04-24-21    Time 3    Period Weeks    Status Deferred    Target Date 04/27/21               PT Long Term Goals - 05/03/21 1704       PT LONG TERM GOAL #1   Title Pt will amb. 250' with large- based quad cane with CGA.    Time 7     Period Weeks    Status New      PT LONG TERM GOAL #2   Title Pt will negotiate steps with 1 hand rail with SBA.    Time 7  PT LONG TERM GOAL #3   Title Improve Berg balance score by at least 8 points to demo improvement in balance and to reduce fall risk.    Time 7    Period Weeks    Status New      PT LONG TERM GOAL #4   Title Pt will stand for at least 5" without UE support and reach at least 10" outside BOS independently for increased independence with ADL's.    Time 7    Period Weeks    Status New      PT LONG TERM GOAL #5   Title Negotiate ramp and curb with LBQC with CGA.    Time 7    Period Weeks    Status New                   Plan - 05/14/21 1511     Clinical Impression Statement Continued gait trainig with SBQC and concurrent Bioness. Patient contiue to demo improved balance with SBQC, w/ PT in agreement that we can begin to use this AD for household mobility. Continued activites/NMR focused on improved weight shift and proximal stability on LLE. Will continue to progress toward all LTGs.    Personal Factors and Comorbidities Behavior Pattern;Comorbidity 2;Transportation;Profession    Comorbidities s/p R PCA CVA with Lt hemiparesis, polysubstance abuse, migraines, obesity, h/o Lt sprained ankle    Examination-Activity Limitations Bathing;Locomotion Level;Transfers;Bend;Caring for Others;Dressing;Lift;Stand;Stairs;Squat;Reach Overhead    Examination-Participation Restrictions Cleaning;Community Activity;Driving;Interpersonal Relationship;Laundry;Yard Work;Shop;Occupation;Meal Prep    Stability/Clinical Decision Making Evolving/Moderate complexity    Rehab Potential Good    PT Frequency 2x / week    PT Duration Other (comment)    PT Treatment/Interventions ADLs/Self Care Home Management;DME Instruction;Gait training;Stair training;Therapeutic activities;Therapeutic exercise;Balance training;Neuromuscular re-education;Patient/family education;Passive range of  motion;Visual/perceptual remediation/compensation    PT Next Visit Plan cont with Bioness - Tablet 1, quick fit electrodes, R upper cuff for L hamstring.  Gait training - increase stance time on L; work on initiation of L swing phase. SLS/tapping activities in standing to improve weight shifting and hip/knee flexion on L.  Bioness in training mode for hamstring exercises.  Review and add to HEP for LLE. continue heel slides. Potential Berg Balance. stepping/reaching/weight shift activities.    Consulted and Agree with Plan of Care Patient             Patient will benefit from skilled therapeutic intervention in order to improve the following deficits and impairments:  Abnormal gait, Decreased activity tolerance, Decreased balance, Decreased safety awareness, Decreased coordination, Decreased strength, Impaired tone, Impaired vision/preception, Impaired UE functional use  Visit Diagnosis: Other abnormalities of gait and mobility  Muscle weakness (generalized)  Unsteadiness on feet  Hemiplegia and hemiparesis following other cerebrovascular disease affecting left non-dominant side (HCC)     Problem List Patient Active Problem List   Diagnosis Date Noted   Sprain of left ankle    Sleep disturbance    Hyperglycemia    Hypoalbuminemia due to protein-calorie malnutrition (HCC)    Dyslipidemia    Hemiparesis affecting left side as late effect of stroke (HCC)    Lethargy    Cerebrovascular accident (CVA) due to occlusion of right posterior cerebral artery ([place]) 02/22/2021   Cerebrovascular accident (CVA) ([place])    Right carotid artery occlusion 02/14/2021    Jones Bales, PT, DPT 05/14/2021, 3:14 PM  [facility] [address] [place], [place], [address] Phone: [phone]   Fax:  [phone]  Name: David Craig MRN: [id number] Date of Birth: [birth year]

## 2021-05-16 ENCOUNTER — Ambulatory Visit: Payer: BC Managed Care – PPO

## 2021-05-16 ENCOUNTER — Ambulatory Visit: Payer: BC Managed Care – PPO | Admitting: Occupational Therapy

## 2021-05-16 ENCOUNTER — Other Ambulatory Visit: Payer: Self-pay

## 2021-05-16 ENCOUNTER — Encounter: Payer: Self-pay | Admitting: Occupational Therapy

## 2021-05-16 DIAGNOSIS — I69854 Hemiplegia and hemiparesis following other cerebrovascular disease affecting left non-dominant side: Secondary | ICD-10-CM | POA: Diagnosis not present

## 2021-05-16 DIAGNOSIS — R29818 Other symptoms and signs involving the nervous system: Secondary | ICD-10-CM | POA: Diagnosis not present

## 2021-05-16 DIAGNOSIS — R278 Other lack of coordination: Secondary | ICD-10-CM | POA: Diagnosis not present

## 2021-05-16 DIAGNOSIS — R2681 Unsteadiness on feet: Secondary | ICD-10-CM

## 2021-05-16 DIAGNOSIS — M25512 Pain in left shoulder: Secondary | ICD-10-CM

## 2021-05-16 DIAGNOSIS — R41840 Attention and concentration deficit: Secondary | ICD-10-CM | POA: Diagnosis not present

## 2021-05-16 DIAGNOSIS — R2689 Other abnormalities of gait and mobility: Secondary | ICD-10-CM

## 2021-05-16 DIAGNOSIS — M6281 Muscle weakness (generalized): Secondary | ICD-10-CM

## 2021-05-16 NOTE — Therapy (Signed)
[facility] [address] [place], [place], [address] Phone: [phone]   Fax:  [phone]  Physical Therapy Treatment  Patient Details  Name: David Craig MRN: [id number] Date of Birth: 11/09/1985 Referring Provider (PT): Reesa Chew, [place]   Encounter Date: 05/16/2021   PT End of Session - 05/16/21 1317     Visit Number 12    Number of Visits 15   2x/week x 7 weeks + eval = 15 visits   Authorization Type BCBS    Authorization - Visit Number 10    Authorization - Number of Visits 15    PT Start Time 5176    PT Stop Time 1607    PT Time Calculation (min) 44 min    Equipment Utilized During Treatment Gait belt   Bioness   Activity Tolerance Patient tolerated treatment well    Behavior During Therapy [facility] for tasks assessed/performed   Decreased safety awareness noted            No past medical history on file.  Past Surgical History:  Procedure Laterality Date   BUBBLE STUDY  02/21/2021   Procedure: BUBBLE STUDY;  Surgeon: Skeet Latch, MD;  Location: [place];  Service: Cardiovascular;;   IR ANGIO INTRA EXTRACRAN SEL COM CAROTID INNOMINATE UNI L MOD SED  02/15/2021   IR ANGIO VERTEBRAL SEL VERTEBRAL BILAT MOD SED  02/15/2021   IR CT HEAD LTD  02/15/2021   IR PERCUTANEOUS ART THROMBECTOMY/INFUSION INTRACRANIAL INC DIAG ANGIO  02/15/2021   IR US GUIDE VASC ACCESS RIGHT  02/15/2021   RADIOLOGY WITH ANESTHESIA N/A 02/14/2021   Procedure: IR WITH ANESTHESIA;  Surgeon: Luanne Bras, MD;  Location: [place];  Service: Radiology;  Laterality: N/A;   TEE WITHOUT CARDIOVERSION N/A 02/21/2021   Procedure: TRANSESOPHAGEAL ECHOCARDIOGRAM (TEE);  Surgeon: Skeet Latch, MD;  Location: [place];  Service: Cardiovascular;  Laterality: N/A;    There were no vitals filed for this visit.   Subjective Assessment - 05/16/21 1316     Subjective No new changes/complaints. Reports aquatics is going well. No pain  to report. No falls.    Pertinent History Rt PCA CVA on 02-14-21; h/o polysubstance abuse (cocaine, marijuana, oxycodone), h/o migraines, h/o Lt ankle sprain    Diagnostic tests CT and MRI  -  revealed Rt PCA infarcts    Patient Stated Goals "Doing what I used to do" - take care of my kids (ages 20 1/2 and 1 1/2)    Currently in Pain? No/denies                               [facility] Adult PT Treatment/Exercise - 05/16/21 0001       Transfers   Transfers Sit to Stand;Stand to Sit    Sit to Stand 5: Supervision    Stand to Sit 5: Supervision    Number of Reps 10 reps;2 sets    Comments completed 2 x 10 reps with RLE positioned on 2" block to increase weight shift onto LLE. supervision.      Ambulation/Gait   Ambulation/Gait Yes    Ambulation/Gait Assistance 4: Min guard    Ambulation/Gait Assistance Details completed gait training x 345 ft with use of SBQC working on weight shift/stance on LLE, and cues for improved heel strike. Also providing cues for upright posture and standing tall with stance on the LLE.    Ambulation Distance (Feet) 345 Feet  Assistive device Small based quad cane    Gait Pattern Step-through pattern;Decreased stride length;Decreased hip/knee flexion - left;Decreased dorsiflexion - left;Trunk flexed;Poor foot clearance - left;Decreased stance time - left    Ambulation Surface Level;Indoor    Stairs Yes    Stairs Assistance 5: Supervision    Stairs Assistance Details (indicate cue type and reason) ascend/descend stairs x 8 steps with step to pattern using rail, PT providing cues for improved hip/knee flexion, intermittent tacticle cues. then trialed with use of SBQC on R side x 4 stairs. patient able to demo proper sequencing.    Stair Management Technique One rail Right;Step to pattern;Forwards    Number of Stairs 12    Height of Stairs 6    Curb 5: Supervision    Curb Details (indicate cue type and reason) inital CGA, progressed to supervision x  5 reps with use of SBQC. PT providing sequencing demo initially but pt demo wtih proper technique      Exercises   Exercises Knee/Hip    Other Exercises  at stairs: completed steps up to 1st step with RLE, working on improved hip/knee flexion and bringing foot up to step. with green theraband around thighs completed mini squats x 10 reps, w/ PT cues for equal weight bearing.      Knee/Hip Exercises: Aerobic   Other Aerobic Completed SciFit with BLE only on Level 2.0 x 5 minutes. Use of green theraband around thighs to help maintain neutral alignment.                 PT Short Term Goals - 05/03/21 1704       PT SHORT TERM GOAL #1   Title Independent in HEP for LLE strengthening and stretching.    Baseline reports independence with current HEP, will continue to benefit from progressive HEP    Time 3    Period Weeks    Status Achieved    Target Date 04/27/21      PT SHORT TERM GOAL #2   Title Pt will participate in assessment of Berg balance test.    Baseline Berg balance assessed on 11/23 scored 41/56    Time 3    Period Weeks    Status Achieved    Target Date 04/27/21      PT SHORT TERM GOAL #3   Title Pt will amb. with RW 350' with SBA on flat, even surface.    Baseline met 04-24-21    Time 3    Period Weeks    Status Achieved    Target Date 04/27/21      PT SHORT TERM GOAL #4   Title Pt will transfer wheelchair to mat independently without cues to lock brakes.    Baseline SBA but needed assistance with locking brakes; pt walked into clinic with RW (is not using wheelchair to access clinic) - 04-24-21    Time 3    Period Weeks    Status Deferred    Target Date 04/27/21               PT Long Term Goals - 05/03/21 1704       PT LONG TERM GOAL #1   Title Pt will amb. 250' with large- based quad cane with CGA.    Time 7    Period Weeks    Status New      PT LONG TERM GOAL #2   Title Pt will negotiate steps with 1 hand rail with SBA.  Time 7       PT LONG TERM GOAL #3   Title Improve Berg balance score by at least 8 points to demo improvement in balance and to reduce fall risk.    Time 7    Period Weeks    Status New      PT LONG TERM GOAL #4   Title Pt will stand for at least 5" without UE support and reach at least 10" outside BOS independently for increased independence with ADL's.    Time 7    Period Weeks    Status New      PT LONG TERM GOAL #5   Title Negotiate ramp and curb with LBQC with CGA.    Time 7    Period Weeks    Status New                Plan - 05/16/21 1356     Clinical Impression Statement Continued gait training with use of SBQC, continue to progress use of htis device with stair negotiatin and curb negotiation with patient require CGA/supervision. Pt has purchased personal SBQC, to bring into future session. Continued activites promoting strengthening of LLE. Will continue per POC.    Personal Factors and Comorbidities Behavior Pattern;Comorbidity 2;Transportation;Profession    Comorbidities s/p R PCA CVA with Lt hemiparesis, polysubstance abuse, migraines, obesity, h/o Lt sprained ankle    Examination-Activity Limitations Bathing;Locomotion Level;Transfers;Bend;Caring for Others;Dressing;Lift;Stand;Stairs;Squat;Reach Overhead    Examination-Participation Restrictions Cleaning;Community Activity;Driving;Interpersonal Relationship;Laundry;Yard Work;Shop;Occupation;Meal Prep    Stability/Clinical Decision Making Evolving/Moderate complexity    Rehab Potential Good    PT Frequency 2x / week    PT Duration Other (comment)    PT Treatment/Interventions ADLs/Self Care Home Management;DME Instruction;Gait training;Stair training;Therapeutic activities;Therapeutic exercise;Balance training;Neuromuscular re-education;Patient/family education;Passive range of motion;Visual/perceptual remediation/compensation    PT Next Visit Plan cont with Bioness - Tablet 1, quick fit electrodes, R upper cuff for L hamstring.   Gait training - increase stance time on L; work on initiation of L swing phase. SLS/tapping activities in standing to improve weight shifting and hip/knee flexion on L.  Bioness in training mode for hamstring exercises.  Review and add to HEP for LLE. continue heel slides. Potential Berg Balance. stepping/reaching/weight shift activities.    Consulted and Agree with Plan of Care Patient             Patient will benefit from skilled therapeutic intervention in order to improve the following deficits and impairments:  Abnormal gait, Decreased activity tolerance, Decreased balance, Decreased safety awareness, Decreased coordination, Decreased strength, Impaired tone, Impaired vision/preception, Impaired UE functional use  Visit Diagnosis: Hemiplegia and hemiparesis following other cerebrovascular disease affecting left non-dominant side (HCC)  Muscle weakness (generalized)  Unsteadiness on feet  Other abnormalities of gait and mobility     Problem List Patient Active Problem List   Diagnosis Date Noted   Sprain of left ankle    Sleep disturbance    Hyperglycemia    Hypoalbuminemia due to protein-calorie malnutrition (HCC)    Dyslipidemia    Hemiparesis affecting left side as late effect of stroke (HCC)    Lethargy    Cerebrovascular accident (CVA) due to occlusion of right posterior cerebral artery ([place]) 02/22/2021   Cerebrovascular accident (CVA) ([place])    Right carotid artery occlusion 02/14/2021    Jones Bales, PT, DPT 05/16/2021, 2:09 PM  [place] [address] [place], [place], [address] Phone: [phone]   Fax:  [phone]  Name:  David Craig MRN: [id number] Date of Birth: [birth year]

## 2021-05-16 NOTE — Therapy (Signed)
[facility] [address] [place], [place], [address] Phone: [phone]   Fax:  [phone]  Occupational Therapy Treatment  Patient Details  Name: David Craig MRN: [id number] Date of Birth: [birth year] Referring Provider (OT): Marissa Nestle   Encounter Date: 05/16/2021   OT End of Session - 05/16/21 1237     Visit Number 12    Number of Visits 15    Date for OT Re-Evaluation 05/30/21    Authorization Type BCBS    Authorization Time Period VL: 30 (PT,OT)    Authorization - Visit Number 11    Authorization - Number of Visits 15    OT Start Time 1235    OT Stop Time 1315    OT Time Calculation (min) 40 min    Activity Tolerance Patient tolerated treatment well    Behavior During Therapy [facility] for tasks assessed/performed             History reviewed. No pertinent past medical history.  Past Surgical History:  Procedure Laterality Date   BUBBLE STUDY  02/21/2021   Procedure: BUBBLE STUDY;  Surgeon: Chilton Si, MD;  Location: [facility] END[facility];  Service: Cardiovascular;;   IR ANGIO INTRA EXTRACRAN SEL COM CAROTID INNOMINATE UNI L MOD SED  02/15/2021   IR ANGIO VERTEBRAL SEL VERTEBRAL BILAT MOD SED  02/15/2021   IR CT HEAD LTD  02/15/2021   IR PERCUTANEOUS ART THROMBECTOMY/INFUSION INTRACRANIAL INC DIAG ANGIO  02/15/2021   IR US GUIDE VASC ACCESS RIGHT  02/15/2021   RADIOLOGY WITH ANESTHESIA N/A 02/14/2021   Procedure: IR WITH ANESTHESIA;  Surgeon: Julieanne Cotton, MD;  [facility];  Service: Radiology;  Laterality: N/A;   TEE WITHOUT CARDIOVERSION N/A 02/21/2021   Procedure: TRANSESOPHAGEAL ECHOCARDIOGRAM (TEE);  Surgeon: Chilton Si, MD;  Location: [facility] ENDOSCOPY;  Service: Cardiovascular;  Laterality: N/A;    There were no vitals filed for this visit.   Subjective Assessment - 05/16/21 1236     Subjective  "I'm a coffee drinker - coffee all day long"    Patient is accompanied by: Family member     Pertinent History PMH: polysubstance abuse/tobacco, migrane headaches and obesity    Limitations Fall Risk, Impulsive, L hemi    Patient Stated Goals "get back to normal self" and to "be able to walk to bathroom and pee and pick up my kids".    Currently in Pain? No/denies    Pain Score 0-No pain                          OT Treatments/Exercises (OP) - 05/16/21 1258       Neurological Re-education Exercises   Other Grasp and Release Exercises  medium cones with LUE with  total assistance for extension of hand and movement pattern for reaching to stack cones. Pt able to grip and sustain grasp of LUE. Pt continues to tap into excessive chest elevation and shoulder elevation and neck rotation to left for activation of LUE shoulder.    Other Grasp and Release Exercises  1 inch blocks with max A for getting hand into grasp with lateral pinch but req'd total A For release.    Other Weight-Bearing Exercises 1 weight bearing into LUE while standing on sliding board with hand with total A for maintaining hand position. Pt able to weight bear and hover with picking up and reaching for bean bags and tossing into basket. Pt repeated in sitting.  OT Short Term Goals - 05/14/21 1853       OT SHORT TERM GOAL #1   Title Pt will be independent with initial HEP    Time 4    Period Weeks    Status Achieved   demonstrated supine self PROM HEP 05/09/21   Target Date 04/25/21      OT SHORT TERM GOAL #2   Title Pt will report pain no greater than 7/10 with HEP    Baseline 8/10 with shoulder flexion/abduction    Time 4    Period Weeks    Status Achieved   pain reports no pain with supine HEP     OT SHORT TERM GOAL #3   Title Pt will verbalize and demonstrate understanding of visual scanning startegies for visual field cut.    Baseline left visual field cut    Time 4    Period Weeks    Status Achieved      OT SHORT TERM GOAL #4   Title Pt will perform  environmental scanning with 90% accuracy or greater    Time 4    Period Weeks    Status Achieved   100% on 04/25/21              OT Long Term Goals - 05/14/21 1853       OT LONG TERM GOAL #1   Title Pt will be independent with updated HEP    Time 9    Period Weeks    Status On-going      OT LONG TERM GOAL #2   Title Pt will perform table top scanning with 95% accuracy or greater with use of visual scanning strategies.    Baseline 89% accuracy with 88's    Time 9    Period Weeks    Status On-going      OT LONG TERM GOAL #3   Title Pt will demonstrate ability to grasp and release cylindrical object and/or 1 inch block with LUE.    Baseline trace volitional composite flexion    Time 9    Period Weeks    Status On-going      OT LONG TERM GOAL #4   Title Pt will demonstrate active LUE movement to reach to low level surface for progression to reaching and obtaining objects    Baseline no active flexion    Time 9    Period Weeks    Status On-going      OT LONG TERM GOAL #5   Title PT will demonsrate using LUE as active and functional stabilizer for 20% or greater of ADLs and IADLs.    Baseline 0%    Time 9    Period Weeks    Status On-going                   Plan - 05/16/21 1312     Clinical Impression Statement Pt continues to have limited activation at LUE hand and over activation of muscles for compensations of reach however making slow and steady progress.    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;Decreased knowledge of use of DME;Vision;Sensation;Flexibility;Coordination;IADL;ROM;UE functional use;Tone;Pain;Strength;GMC;Dexterity    Cognitive Skills Attention;Safety Awareness    Rehab Potential Good    Clinical  Decision Making Limited treatment options, no task modification  necessary    Comorbidities Affecting Occupational Performance: None    Modification or Assistance to Complete Evaluation  No modification of tasks or assist necessary to complete eval    OT Frequency 2x / week    OT Duration Other (comment)   15 visits over 9 weeks d/t any scheduling conflicts   OT Treatment/Interventions Self-care/ADL training;Aquatic Therapy;Electrical Stimulation;Energy conservation;Manual Therapy;Patient/family education;Visual/perceptual remediation/compensation;Passive range of motion;Neuromuscular education;Building services engineer;Therapeutic exercise;Cognitive remediation/compensation;Moist Heat;Fluidtherapy;DME and/or AE instruction;Splinting;Therapeutic activities;Ultrasound    Plan HEP for self PROM for LUE, Neuroreed of LUE, aquatics for postural control, functional mobility, and NMR LUE    Consulted and Agree with Plan of Care Patient    Family Member Consulted spouse             Patient will benefit from skilled therapeutic intervention in order to improve the following deficits and impairments:   Body Structure / Function / Physical Skills: ADL, Decreased knowledge of use of DME, Vision, Sensation, Flexibility, Coordination, IADL, ROM, UE functional use, Tone, Pain, Strength, GMC, Dexterity Cognitive Skills: Attention, Safety Awareness     Visit Diagnosis: Hemiplegia and hemiparesis following other cerebrovascular disease affecting left non-dominant side (HCC)  Other lack of coordination  Muscle weakness (generalized)  Unsteadiness on feet  Acute pain of left shoulder  Attention and concentration deficit  Other symptoms and signs involving the nervous system    Problem List Patient Active Problem List   Diagnosis Date Noted   Sprain of left ankle    Sleep disturbance    Hyperglycemia    Hypoalbuminemia due to protein-calorie malnutrition (HCC)    Dyslipidemia     Hemiparesis affecting left side as late effect of stroke (HCC)    Lethargy    Cerebrovascular accident (CVA) due to occlusion of right posterior cerebral artery (HCC) 02/22/2021   Cerebrovascular accident (CVA) [facility])    Right carotid artery occlusion 02/14/2021    Junious Dresser, OT 05/16/2021, 1:18 PM  [place] [facility] [address] [place], [place], [address] Phone: [phone]   Fax:  [phone]  Name: Tiger Spieker MRN: [id number] Date of Birth: 09/11/1985

## 2021-05-18 DIAGNOSIS — I69998 Other sequelae following unspecified cerebrovascular disease: Secondary | ICD-10-CM | POA: Diagnosis not present

## 2021-05-18 DIAGNOSIS — I639 Cerebral infarction, unspecified: Secondary | ICD-10-CM | POA: Diagnosis not present

## 2021-05-18 DIAGNOSIS — H538 Other visual disturbances: Secondary | ICD-10-CM | POA: Diagnosis not present

## 2021-05-18 DIAGNOSIS — G510 Bell's palsy: Secondary | ICD-10-CM | POA: Diagnosis not present

## 2021-05-21 ENCOUNTER — Ambulatory Visit: Payer: BC Managed Care – PPO | Admitting: Physical Therapy

## 2021-05-21 ENCOUNTER — Ambulatory Visit: Payer: BC Managed Care – PPO | Admitting: Occupational Therapy

## 2021-05-21 ENCOUNTER — Encounter: Payer: Self-pay | Admitting: Occupational Therapy

## 2021-05-21 ENCOUNTER — Other Ambulatory Visit: Payer: Self-pay

## 2021-05-21 DIAGNOSIS — R2681 Unsteadiness on feet: Secondary | ICD-10-CM | POA: Diagnosis not present

## 2021-05-21 DIAGNOSIS — M6281 Muscle weakness (generalized): Secondary | ICD-10-CM

## 2021-05-21 DIAGNOSIS — R2689 Other abnormalities of gait and mobility: Secondary | ICD-10-CM

## 2021-05-21 DIAGNOSIS — I69854 Hemiplegia and hemiparesis following other cerebrovascular disease affecting left non-dominant side: Secondary | ICD-10-CM | POA: Diagnosis not present

## 2021-05-21 DIAGNOSIS — M25512 Pain in left shoulder: Secondary | ICD-10-CM | POA: Diagnosis not present

## 2021-05-21 DIAGNOSIS — R278 Other lack of coordination: Secondary | ICD-10-CM

## 2021-05-21 DIAGNOSIS — I63531 Cerebral infarction due to unspecified occlusion or stenosis of right posterior cerebral artery: Secondary | ICD-10-CM | POA: Diagnosis not present

## 2021-05-21 DIAGNOSIS — R29818 Other symptoms and signs involving the nervous system: Secondary | ICD-10-CM | POA: Diagnosis not present

## 2021-05-21 DIAGNOSIS — R41840 Attention and concentration deficit: Secondary | ICD-10-CM | POA: Diagnosis not present

## 2021-05-21 NOTE — Therapy (Signed)
[facility] [address] [place], [place], [address] Phone: [phone]   Fax:  [phone]  Occupational Therapy Treatment  Patient Details  Name: David Craig MRN: [id number] Date of Birth: February 29, 1986 Referring Provider (OT): Marissa Nestle   Encounter Date: 05/21/2021   OT End of Session - 05/21/21 1821     Visit Number 13    Number of Visits 15    Date for OT Re-Evaluation 05/30/21    Authorization Type BCBS    Authorization Time Period VL: 30 (PT,OT)    Authorization - Visit Number 12    Authorization - Number of Visits 15    OT Start Time 1500    OT Stop Time 1545    OT Time Calculation (min) 45 min    Activity Tolerance Patient tolerated treatment well    Behavior During Therapy [facility] for tasks assessed/performed             History reviewed. No pertinent past medical history.  Past Surgical History:  Procedure Laterality Date   BUBBLE STUDY  02/21/2021   Procedure: BUBBLE STUDY;  Surgeon: Chilton Si, MD;  Location: [facility] ENDOSCOPY;  Service: Cardiovascular;;   IR ANGIO INTRA EXTRACRAN SEL COM CAROTID INNOMINATE UNI L MOD SED  02/15/2021   IR ANGIO VERTEBRAL SEL VERTEBRAL BILAT MOD SED  02/15/2021   IR CT HEAD LTD  02/15/2021   IR PERCUTANEOUS ART THROMBECTOMY/INFUSION INTRACRANIAL INC DIAG ANGIO  02/15/2021   IR US GUIDE VASC ACCESS RIGHT  02/15/2021   RADIOLOGY WITH ANESTHESIA N/A 02/14/2021   Procedure: IR WITH ANESTHESIA;  Surgeon: Julieanne Cotton, MD;  Location: [facility] OR;  Service: Radiology;  Laterality: N/A;   TEE WITHOUT CARDIOVERSION N/A 02/21/2021   Procedure: TRANSESOPHAGEAL ECHOCARDIOGRAM (TEE);  Surgeon: Chilton Si, MD;  Location: [facility] ENDOSCOPY;  Service: Cardiovascular;  Laterality: N/A;    There were no vitals filed for this visit.   Subjective Assessment - 05/21/21 1820     Subjective  Do you think the myo-plex would help me - my friend had one.  I am talking with them on the 20th.     Patient is accompanied by: Family member    Pertinent History PMH: polysubstance abuse/tobacco, migrane headaches and obesity    Limitations Fall Risk, Impulsive, L hemi    Currently in Pain? No/denies    Pain Score 0-No pain             Patient seen for aquatic therapy today.  Session occurred in 3.5-4.0 ft of water.  Patient requires mod assist for alignment of left lower leg when entering and exiting the pool via steps and one railing.   Patient assisted to walk in water - with facilitation for alignment of Left foot, and ankle as well as hip and trunk.  Patient with improved performance of maintaining upright posture throughout walking.   In modified plantigrade position, patient able to accept weight onto Left open hand on underwater seat.  Patient able to show trace activation of elbow extesnion in this position.  Worked on shifting weight onto left hand, and un-weighting RUE.  Patient showed trace wrist and finger extension immediately following weight bearing.                         OT Short Term Goals - 05/21/21 1829       OT SHORT TERM GOAL #1   Title Pt will be independent with initial HEP  Time 4    Period Weeks    Status Achieved   demonstrated supine self PROM HEP 05/09/21   Target Date 04/25/21      OT SHORT TERM GOAL #2   Title Pt will report pain no greater than 7/10 with HEP    Baseline 8/10 with shoulder flexion/abduction    Time 4    Period Weeks    Status Achieved   pain reports no pain with supine HEP     OT SHORT TERM GOAL #3   Title Pt will verbalize and demonstrate understanding of visual scanning startegies for visual field cut.    Baseline left visual field cut    Time 4    Period Weeks    Status Achieved      OT SHORT TERM GOAL #4   Title Pt will perform environmental scanning with 90% accuracy or greater    Time 4    Period Weeks    Status Achieved   100% on 04/25/21              OT Long Term Goals - 05/21/21  1829       OT LONG TERM GOAL #1   Title Pt will be independent with updated HEP    Time 9    Period Weeks    Status On-going      OT LONG TERM GOAL #2   Title Pt will perform table top scanning with 95% accuracy or greater with use of visual scanning strategies.    Baseline 89% accuracy with 88's    Time 9    Period Weeks    Status On-going      OT LONG TERM GOAL #3   Title Pt will demonstrate ability to grasp and release cylindrical object and/or 1 inch block with LUE.    Baseline trace volitional composite flexion    Time 9    Period Weeks    Status On-going      OT LONG TERM GOAL #4   Title Pt will demonstrate active LUE movement to reach to low level surface for progression to reaching and obtaining objects    Baseline no active flexion    Time 9    Period Weeks    Status On-going      OT LONG TERM GOAL #5   Title PT will demonsrate using LUE as active and functional stabilizer for 20% or greater of ADLs and IADLs.    Baseline 0%    Time 9    Period Weeks    Status On-going                   Plan - 05/21/21 1822     Clinical Impression Statement Pt is very focused on obtaining movement in distal LUE.  He continues to show improving postural control and alignment over his feet with walking.  Patient ahs sensory loss in LUE and therefore it is challenging for him to feel muscle activation / overactivation.    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;Decreased knowledge of use of DME;Vision;Sensation;Flexibility;Coordination;IADL;ROM;UE functional use;Tone;Pain;Strength;GMC;Dexterity    Cognitive Skills Attention;Safety Awareness    Rehab Potential Good    Clinical Decision Making Limited treatment options, no task  modification necessary    Comorbidities Affecting Occupational Performance: None  Modification or Assistance to Complete Evaluation  No modification of tasks or assist necessary to complete eval    OT Frequency 2x / week    OT Duration Other (comment)   15 visits over 9 weeks d/t any scheduling conflicts   OT Treatment/Interventions Self-care/ADL training;Aquatic Therapy;Electrical Stimulation;Energy conservation;Manual Therapy;Patient/family education;Visual/perceptual remediation/compensation;Passive range of motion;Neuromuscular education;Building services engineer;Therapeutic exercise;Cognitive remediation/compensation;Moist Heat;Fluidtherapy;DME and/or AE instruction;Splinting;Therapeutic activities;Ultrasound    Plan HEP for self PROM for LUE, Neuroreed of LUE, aquatics for postural control, functional mobility, and NMR LUE    Consulted and Agree with Plan of Care Patient    Family Member Consulted spouse             Patient will benefit from skilled therapeutic intervention in order to improve the following deficits and impairments:   Body Structure / Function / Physical Skills: ADL, Decreased knowledge of use of DME, Vision, Sensation, Flexibility, Coordination, IADL, ROM, UE functional use, Tone, Pain, Strength, GMC, Dexterity Cognitive Skills: Attention, Safety Awareness     Visit Diagnosis: Hemiplegia and hemiparesis following other cerebrovascular disease affecting left non-dominant side (HCC)  Muscle weakness (generalized)  Unsteadiness on feet  Other lack of coordination  Other symptoms and signs involving the nervous system    Problem List Patient Active Problem List   Diagnosis Date Noted   Sprain of left ankle    Sleep disturbance    Hyperglycemia    Hypoalbuminemia due to protein-calorie malnutrition (HCC)    Dyslipidemia    Hemiparesis affecting left side as late effect of stroke (HCC)    Lethargy    Cerebrovascular accident (CVA) due to occlusion  of right posterior cerebral artery (HCC) 02/22/2021   Cerebrovascular accident (CVA) (HCC)    Right carotid artery occlusion 02/14/2021    Collier Salina, OT 05/21/2021, 6:30 PM  [facility] [address] [place], [place], [address] Phone: [phone]   Fax:  [phone]  Name: David Craig MRN: [id number] Date of Birth: Oct 21, 1985

## 2021-05-21 NOTE — Therapy (Signed)
[facility] [address] [place], [place], [address] Phone: [phone]   Fax:  [phone]  Physical Therapy Treatment  Patient Details  Name: David Craig MRN: [id number] Date of Birth: 1986-04-01 Referring Provider (PT): Reesa Chew, [place]   Encounter Date: 05/21/2021   PT End of Session - 05/21/21 1255     Visit Number 13    Number of Visits 15   2x/week x 7 weeks + eval = 15 visits   Authorization Type BCBS    Authorization - Visit Number 13    Authorization - Number of Visits 15    PT Start Time 7106    PT Stop Time 1150   pt arrived 25" late for PT appt   PT Time Calculation (min) 24 min    Equipment Utilized During Treatment Gait belt   Bioness   Activity Tolerance Patient tolerated treatment well    Behavior During Therapy [facility] for tasks assessed/performed   Decreased safety awareness noted            No past medical history on file.  Past Surgical History:  Procedure Laterality Date   BUBBLE STUDY  02/21/2021   Procedure: BUBBLE STUDY;  Surgeon: Skeet Latch, MD;  Location: [facility];  Service: Cardiovascular;;   IR ANGIO INTRA EXTRACRAN SEL COM CAROTID INNOMINATE UNI L MOD SED  02/15/2021   IR ANGIO VERTEBRAL SEL VERTEBRAL BILAT MOD SED  02/15/2021   IR CT HEAD LTD  02/15/2021   IR PERCUTANEOUS ART THROMBECTOMY/INFUSION INTRACRANIAL INC DIAG ANGIO  02/15/2021   IR US GUIDE VASC ACCESS RIGHT  02/15/2021   RADIOLOGY WITH ANESTHESIA N/A 02/14/2021   Procedure: IR WITH ANESTHESIA;  Surgeon: Luanne Bras, MD;  Location: [facility];  Service: Radiology;  Laterality: N/A;   TEE WITHOUT CARDIOVERSION N/A 02/21/2021   Procedure: TRANSESOPHAGEAL ECHOCARDIOGRAM (TEE);  Surgeon: Skeet Latch, MD;  Location: [facility];  Service: Cardiovascular;  Laterality: N/A;    There were no vitals filed for this visit.   Subjective Assessment - 05/21/21 1250     Subjective Pt arrives 25" late due to  transportation; pt is using [facility] for assistance with ambulation - states he has ordered quad cane from [employer] but it has not been delivered yet    Pertinent History Rt PCA CVA on 02-14-21; h/o polysubstance abuse (cocaine, marijuana, oxycodone), h/o migraines, h/o Lt ankle sprain    Diagnostic tests CT and MRI  -  revealed Rt PCA infarcts    Patient Stated Goals "Doing what I used to do" - take care of my kids (ages 20 1/2 and 1 1/2)    Currently in Pain? No/denies                               [facility] Adult PT Treatment/Exercise - 05/21/21 1137       Transfers   Transfers Sit to Stand;Stand to Sit    Sit to Stand 5: Supervision    Stand to Sit 5: Supervision    Number of Reps Other reps (comment)   2   Comments RUE used from mat      Ambulation/Gait   Ambulation/Gait Yes    Ambulation/Gait Assistance 4: Min guard    Ambulation/Gait Assistance Details completed 2 laps around track (230') with concurrent Bioness stimulating Lt anterior tibialis; cues to internally rotate Lt foot as able    Ambulation Distance (Feet) 230 Feet  Assistive device Straight cane    Gait Pattern Step-through pattern;Decreased stride length;Decreased hip/knee flexion - left;Decreased dorsiflexion - left;Trunk flexed;Poor foot clearance - left;Decreased stance time - left    Ambulation Surface Level;Indoor             Pt performed SciFit exercise at end of session (NO CHARGE as unsupervised) - level 4.0 for 12" total;  Pt increased level to 7.0 for final minute          PT Short Term Goals - 05/21/21 1344       PT SHORT TERM GOAL #1   Title Independent in HEP for LLE strengthening and stretching.    Baseline reports independence with current HEP, will continue to benefit from progressive HEP    Time 3    Period Weeks    Status Achieved    Target Date 04/27/21      PT SHORT TERM GOAL #2   Title Pt will participate in assessment of Berg balance test.    Baseline Berg  balance assessed on 11/23 scored 41/56    Time 3    Period Weeks    Status Achieved    Target Date 04/27/21      PT SHORT TERM GOAL #3   Title Pt will amb. with RW 350' with SBA on flat, even surface.    Baseline met 04-24-21    Time 3    Period Weeks    Status Achieved    Target Date 04/27/21      PT SHORT TERM GOAL #4   Title Pt will transfer wheelchair to mat independently without cues to lock brakes.    Baseline SBA but needed assistance with locking brakes; pt walked into clinic with RW (is not using wheelchair to access clinic) - 04-24-21    Time 3    Period Weeks    Status Deferred    Target Date 04/27/21               PT Long Term Goals - 05/21/21 1344       PT LONG TERM GOAL #1   Title Pt will amb. 250' with large- based quad cane with CGA.    Time 7    Period Weeks    Status New      PT LONG TERM GOAL #2   Title Pt will negotiate steps with 1 hand rail with SBA.    Time 7      PT LONG TERM GOAL #3   Title Improve Berg balance score by at least 8 points to demo improvement in balance and to reduce fall risk.    Time 7    Period Weeks    Status New      PT LONG TERM GOAL #4   Title Pt will stand for at least 5" without UE support and reach at least 10" outside BOS independently for increased independence with ADL's.    Time 7    Period Weeks    Status New      PT LONG TERM GOAL #5   Title Negotiate ramp and curb with LBQC with CGA.    Time 7    Period Weeks    Status New                   Plan - 05/21/21 1306     Clinical Impression Statement Pt did well with ambulating with SPC with no LOB noted.  Bioness provided good activation of Lt dorsiflexors with  good foot clearance in swing phase of gait; pt able to internally rotate LLE with verbal cues.  Pt able to amb. safely SPC - SBQC may not be needed.  Cont with POC.    Personal Factors and Comorbidities Behavior Pattern;Comorbidity 2;Transportation;Profession    Comorbidities s/p R  PCA CVA with Lt hemiparesis, polysubstance abuse, migraines, obesity, h/o Lt sprained ankle    Examination-Activity Limitations Bathing;Locomotion Level;Transfers;Bend;Caring for Others;Dressing;Lift;Stand;Stairs;Squat;Reach Overhead    Examination-Participation Restrictions Cleaning;Community Activity;Driving;Interpersonal Relationship;Laundry;Yard Work;Shop;Occupation;Meal Prep    Stability/Clinical Decision Making Evolving/Moderate complexity    Rehab Potential Good    PT Frequency 2x / week    PT Duration Other (comment)    PT Treatment/Interventions ADLs/Self Care Home Management;DME Instruction;Gait training;Stair training;Therapeutic activities;Therapeutic exercise;Balance training;Neuromuscular re-education;Patient/family education;Passive range of motion;Visual/perceptual remediation/compensation    PT Next Visit Plan cont with Bioness - Tablet 1, quick fit electrodes, R upper cuff for L hamstring.  Gait training - increase stance time on L; work on initiation of L swing phase. SLS/tapping activities in standing to improve weight shifting and hip/knee flexion on L.  Bioness in training mode for hamstring exercises.  Review and add to HEP for LLE. continue heel slides. Potential Berg Balance. stepping/reaching/weight shift activities.    Consulted and Agree with Plan of Care Patient             Patient will benefit from skilled therapeutic intervention in order to improve the following deficits and impairments:  Abnormal gait, Decreased activity tolerance, Decreased balance, Decreased safety awareness, Decreased coordination, Decreased strength, Impaired tone, Impaired vision/preception, Impaired UE functional use  Visit Diagnosis: Hemiplegia and hemiparesis following other cerebrovascular disease affecting left non-dominant side (HCC)  Other abnormalities of gait and mobility     Problem List Patient Active Problem List   Diagnosis Date Noted   Sprain of left ankle    Sleep  disturbance    Hyperglycemia    Hypoalbuminemia due to protein-calorie malnutrition (HCC)    Dyslipidemia    Hemiparesis affecting left side as late effect of stroke (HCC)    Lethargy    Cerebrovascular accident (CVA) due to occlusion of right posterior cerebral artery ([place]) 02/22/2021   Cerebrovascular accident (CVA) [facility])    Right carotid artery occlusion 02/14/2021    Brandyce Dimario, [place], PT 05/21/2021, 1:45 PM  [facility] [address] [place], [place], [address] Phone: [phone]   Fax:  [phone]  Name: David Craig MRN: [id number] Date of Birth: 1985-10-24

## 2021-05-23 ENCOUNTER — Ambulatory Visit: Payer: BC Managed Care – PPO | Admitting: Occupational Therapy

## 2021-05-23 ENCOUNTER — Encounter: Payer: Self-pay | Admitting: Occupational Therapy

## 2021-05-23 ENCOUNTER — Other Ambulatory Visit: Payer: Self-pay

## 2021-05-23 ENCOUNTER — Ambulatory Visit: Payer: BC Managed Care – PPO

## 2021-05-23 ENCOUNTER — Encounter: Payer: BC Managed Care – PPO | Admitting: Speech Pathology

## 2021-05-23 DIAGNOSIS — I69854 Hemiplegia and hemiparesis following other cerebrovascular disease affecting left non-dominant side: Secondary | ICD-10-CM | POA: Diagnosis not present

## 2021-05-23 DIAGNOSIS — R2681 Unsteadiness on feet: Secondary | ICD-10-CM

## 2021-05-23 DIAGNOSIS — M6281 Muscle weakness (generalized): Secondary | ICD-10-CM | POA: Diagnosis not present

## 2021-05-23 DIAGNOSIS — R29818 Other symptoms and signs involving the nervous system: Secondary | ICD-10-CM

## 2021-05-23 DIAGNOSIS — R41840 Attention and concentration deficit: Secondary | ICD-10-CM

## 2021-05-23 DIAGNOSIS — R2689 Other abnormalities of gait and mobility: Secondary | ICD-10-CM | POA: Diagnosis not present

## 2021-05-23 DIAGNOSIS — I639 Cerebral infarction, unspecified: Secondary | ICD-10-CM | POA: Diagnosis not present

## 2021-05-23 DIAGNOSIS — R278 Other lack of coordination: Secondary | ICD-10-CM | POA: Diagnosis not present

## 2021-05-23 DIAGNOSIS — M25512 Pain in left shoulder: Secondary | ICD-10-CM

## 2021-05-23 NOTE — Therapy (Signed)
[facility] [address] [place], [place], [address] Phone: [phone]   Fax:  [phone]  Physical Therapy Treatment  Patient Details  Name: David Craig MRN: [id number] Date of Birth: 03/04/1986 Referring Provider (PT): Reesa Chew, [place]   Encounter Date: 05/23/2021   PT End of Session - 05/23/21 1056     Visit Number 14    Number of Visits 30   2x/week x 7 weeks + eval = 15 visits   Date for PT Re-Evaluation 07/27/21    Authorization Type BCBS (VL: 30 PT/OT) per year    Authorization - Visit Number 14    Authorization - Number of Visits 15    PT Start Time 2482    PT Stop Time 1142    PT Time Calculation (min) 44 min    Equipment Utilized During Treatment Gait belt   Bioness   Activity Tolerance Patient tolerated treatment well    Behavior During Therapy [facility] for tasks assessed/performed   Decreased safety awareness noted            History reviewed. No pertinent past medical history.  Past Surgical History:  Procedure Laterality Date   BUBBLE STUDY  02/21/2021   Procedure: BUBBLE STUDY;  Surgeon: Skeet Latch, MD;  Location: [facility];  Service: Cardiovascular;;   IR ANGIO INTRA EXTRACRAN SEL COM CAROTID INNOMINATE UNI L MOD SED  02/15/2021   IR ANGIO VERTEBRAL SEL VERTEBRAL BILAT MOD SED  02/15/2021   IR CT HEAD LTD  02/15/2021   IR PERCUTANEOUS ART THROMBECTOMY/INFUSION INTRACRANIAL INC DIAG ANGIO  02/15/2021   IR US GUIDE VASC ACCESS RIGHT  02/15/2021   RADIOLOGY WITH ANESTHESIA N/A 02/14/2021   Procedure: IR WITH ANESTHESIA;  Surgeon: Luanne Bras, MD;  Location: [place];  Service: Radiology;  Laterality: N/A;   TEE WITHOUT CARDIOVERSION N/A 02/21/2021   Procedure: TRANSESOPHAGEAL ECHOCARDIOGRAM (TEE);  Surgeon: Skeet Latch, MD;  Location: [place];  Service: Cardiovascular;  Laterality: N/A;    There were no vitals filed for this visit.   Subjective Assessment - 05/23/21 1058      Subjective Patient reports no new changes/complaints. No falls to report. Continues to use [facility] and likes it.    Pertinent History Rt PCA CVA on 02-14-21; h/o polysubstance abuse (cocaine, marijuana, oxycodone), h/o migraines, h/o Lt ankle sprain    Diagnostic tests CT and MRI  -  revealed Rt PCA infarcts    Patient Stated Goals "Doing what I used to do" - take care of my kids (ages 20 1/2 and 1 1/2)    Currently in Pain? No/denies                [facility] PT Assessment - 05/23/21 0001       Assessment   Medical Diagnosis Rt PCA CVA    Referring Provider (PT) Reesa Chew, PA-C      Transfers   Sit to Stand 6: Modified independent (Device/Increase time)    Stand to Sit 6: Modified independent (Device/Increase time)    Comments wihtout UE support from mat      Ambulation/Gait   Stairs Assistance 5: Supervision;4: Min guard    Ramp Details (indicate cue type and reason) using SPC ambulate up/down ramp, supervision level slowed gait speed.      Standardized Balance Assessment   Standardized Balance Assessment Berg Balance Test      Berg Balance Test   Sit to Stand Able to stand without using hands and stabilize independently  Standing Unsupported Able to stand safely 2 minutes    Sitting with Back Unsupported but Feet Supported on Floor or Stool Able to sit safely and securely 2 minutes    Stand to Sit Sits safely with minimal use of hands    Transfers Able to transfer safely, minor use of hands    Standing Unsupported with Eyes Closed Able to stand 10 seconds safely    Standing Unsupported with Feet Together Able to place feet together independently and stand 1 minute safely    From Standing, Reach Forward with Outstretched Arm Can reach confidently >25 cm (10")   11"   From Standing Position, Pick up Object from Floor Able to pick up shoe safely and easily    From Standing Position, Turn to Look Behind Over each Shoulder Looks behind from both sides and weight shifts well     Turn 360 Degrees Able to turn 360 degrees safely but slowly    Standing Unsupported, Alternately Place Feet on Step/Stool Able to stand independently and complete 8 steps >20 seconds    Standing Unsupported, One Foot in Front Able to take small step independently and hold 30 seconds    Standing on One Leg Tries to lift leg/unable to hold 3 seconds but remains standing independently    Total Score 48    Berg comment: 48/56              [facility] Adult PT Treatment/Exercise - 05/23/21 0001       Transfers   Transfers Sit to Stand;Stand to Sit      Ambulation/Gait   Ambulation/Gait Yes    Ambulation/Gait Assistance 6: Modified independent (Device/Increase time)    Ambulation/Gait Assistance Details On level surfaces patient ambulating Mod I with no assistance required from PT, using SPC. Continue to require cues for proper gait pattern and AFO donned for improved stability of LLE.    Ambulation Distance (Feet) 400 Feet    Assistive device Straight cane    Gait Pattern Step-through pattern;Decreased stride length;Decreased hip/knee flexion - left;Decreased dorsiflexion - left;Trunk flexed;Poor foot clearance - left;Decreased stance time - left    Ambulation Surface Level;Indoor    Stairs Yes    Stairs Assistance Details (indicate cue type and reason) ascend/descend with single rail on R side, reciprocal pattern but increased challenge with clearing RLE, one step to required for balance with descent    Stair Management Technique One rail Right;Alternating pattern;Step to pattern;Forwards    Number of Stairs 8    Height of Stairs 6    Ramp 5: Supervision    Curb 5: Supervision    Curb Details (indicate cue type and reason) use of SPC, supervision required. no cues required for sequencing but mild unsteadiness with step up.      Therapeutic Activites    Therapeutic Activities Other Therapeutic Activities    Other Therapeutic Activities Completed standing tolerance, patient able to stand x 5  minutes without UE support and with addition of reaching outside base of support of approx 11 inches. No imbalance noted.               PT Education - 05/23/21 1058     Education Details progress toward LTGs    Person(s) Educated Patient    Methods Explanation    Comprehension Verbalized understanding              PT Short Term Goals - 05/21/21 1344       PT SHORT TERM GOAL #1  Title Independent in HEP for LLE strengthening and stretching.    Baseline reports independence with current HEP, will continue to benefit from progressive HEP    Time 3    Period Weeks    Status Achieved    Target Date 04/27/21      PT SHORT TERM GOAL #2   Title Pt will participate in assessment of Berg balance test.    Baseline Berg balance assessed on 11/23 scored 41/56    Time 3    Period Weeks    Status Achieved    Target Date 04/27/21      PT SHORT TERM GOAL #3   Title Pt will amb. with RW 350' with SBA on flat, even surface.    Baseline met 04-24-21    Time 3    Period Weeks    Status Achieved    Target Date 04/27/21      PT SHORT TERM GOAL #4   Title Pt will transfer wheelchair to mat independently without cues to lock brakes.    Baseline SBA but needed assistance with locking brakes; pt walked into clinic with RW (is not using wheelchair to access clinic) - 04-24-21    Time 3    Period Weeks    Status Deferred    Target Date 04/27/21               PT Long Term Goals - 05/23/21 1120       PT LONG TERM GOAL #1   Title Pt will amb. 250' with large- based quad cane with CGA.    Baseline 400' w/ SPC Supervision indoors    Time 7    Period Weeks    Status Achieved      PT LONG TERM GOAL #2   Title Pt will negotiate steps with 1 hand rail with SBA.    Baseline able to ascend/descend stairs with single rail step to/reciprocal supervision    Time 7    Status Achieved      PT LONG TERM GOAL #3   Title Improve Berg balance score by at least 8 points to demo  improvement in balance and to reduce fall risk.    Baseline 41/56; 48/56 (progress but not to goal level)    Time 7    Period Weeks    Status Partially Met      PT LONG TERM GOAL #4   Title Pt will stand for at least 5" without UE support and reach at least 10" outside BOS independently for increased independence with ADL's.    Baseline stand 5" without UE support and reach 11" outside BOS    Time 7    Period Weeks    Status Achieved      PT LONG TERM GOAL #5   Title Negotiate ramp and curb with [facility] with CGA.    Baseline negotiate ramp/curb with [facility] and supervision    Time 7    Period Weeks    Status Achieved             Updated Short Term Goals:   PT Short Term Goals - 05/23/21 1158       PT SHORT TERM GOAL #1   Title Patient will be able to ambulate indoors for >/= 500 ft with SPC vs. no AD Mod I    Baseline 400 ft with SPC mod I    Time 4    Period Weeks    Status New  Updated Long Term Goals:     PT Long Term Goals - 05/23/21 1159       PT LONG TERM GOAL #1   Title Pt will ambulate >/= 500 ft outdoors with SPC vs. no AD and mod I for improved community mobility (ALL LTGs Due: 07/27/21)    Baseline 400' w/ SPC Supervision indoors; TBA outdoors    Time 8    Period Weeks    Status Revised    Target Date 07/27/21      PT LONG TERM GOAL #2   Title Pt will be able to ascend/descend stairs with SPC with reciprocal pattern Mod I for improved entry/exit into home.    Baseline reciprocal pattern CGA and single rail    Time 8    Period Weeks    Status Revised      PT LONG TERM GOAL #3   Title Patient will improve Berg Balance to >/= 50/56 to demo improved balance and reduced fall risk    Baseline 41/56; 48/56 (progress but not to goal level)    Time 8    Period Weeks    Status Revised      PT LONG TERM GOAL #4   Title Patient will be independent with final progressive HEP for strength/balance    Baseline HEP provided, will benefit from  progressive HEP    Time 8    Period Weeks    Status New                 Plan - 05/23/21 1149     Clinical Impression Statement Today's skilled PT session focused on assessment of patient's progress toward LTGs. Patient able to meet or partially meet all LTGs. Patient has progressed from RW to [employer] and ambulating Mod I level on indoor surfaces, conitnue to require supervision with curb, ramp, and stairs with step to pattern. Continue to demo challenge with reciprocal stair pattern requiring CGA. Patient improved Berg Balance to 48/56 demonstrating improved balance. Paitent is making steady progress with PT services and will continue to benefit from skilled PT services to progress toward ambulating with LRAD vs. No AD and improved functional mobility within the community.    Personal Factors and Comorbidities Behavior Pattern;Comorbidity 2;Transportation;Profession    Comorbidities s/p R PCA CVA with Lt hemiparesis, polysubstance abuse, migraines, obesity, h/o Lt sprained ankle    Examination-Activity Limitations Bathing;Locomotion Level;Transfers;Bend;Caring for Others;Dressing;Lift;Stand;Stairs;Squat;Reach Overhead    Examination-Participation Restrictions Cleaning;Community Activity;Driving;Interpersonal Relationship;Laundry;Yard Work;Shop;Occupation;Meal Prep    Stability/Clinical Decision Making Evolving/Moderate complexity    Rehab Potential Good    PT Frequency 1x / week    PT Duration 8 weeks    PT Treatment/Interventions ADLs/Self Care Home Management;DME Instruction;Gait training;Stair training;Therapeutic activities;Therapeutic exercise;Balance training;Neuromuscular re-education;Patient/family education;Passive range of motion;Visual/perceptual remediation/compensation    PT Next Visit Plan Trial gait outdoors if weather permits. cont with Bioness - Tablet 1, quick fit electrodes, R upper cuff for L hamstring.  Gait training - increase stance time on L; work on initiation of L  swing phase. SLS/tapping activities in standing to improve weight shifting and hip/knee flexion on L.  Bioness in training mode for hamstring exercises.  Review and add to HEP for LLE. continue heel slides. Potential Berg Balance. stepping/reaching/weight shift activities.    Consulted and Agree with Plan of Care Patient             Patient will benefit from skilled therapeutic intervention in order to improve the following deficits and impairments:  Abnormal gait, Decreased  activity tolerance, Decreased balance, Decreased safety awareness, Decreased coordination, Decreased strength, Impaired tone, Impaired vision/preception, Impaired UE functional use  Visit Diagnosis: Hemiplegia and hemiparesis following other cerebrovascular disease affecting left non-dominant side (HCC)  Muscle weakness (generalized)  Other abnormalities of gait and mobility  Unsteadiness on feet     Problem List Patient Active Problem List   Diagnosis Date Noted   Sprain of left ankle    Sleep disturbance    Hyperglycemia    Hypoalbuminemia due to protein-calorie malnutrition (HCC)    Dyslipidemia    Hemiparesis affecting left side as late effect of stroke (HCC)    Lethargy    Cerebrovascular accident (CVA) due to occlusion of right posterior cerebral artery ([place]) 02/22/2021   Cerebrovascular accident (CVA) ([place])    Right carotid artery occlusion 02/14/2021    Jones Bales, PT, DPT 05/23/2021, 11:53 AM  [facility] [address] [place], [place], [address] Phone: [phone]   Fax:  [phone]  Name: David Craig MRN: [id number] Date of Birth: March 10, 1986

## 2021-05-23 NOTE — Therapy (Signed)
[facility] [address] [place], [place], [address] Phone: [phone]   Fax:  [phone]  Occupational Therapy Treatment  Patient Details  Name: David Craig MRN: [id number] Date of Birth: Jul 22, 1985 Referring Provider (OT): Marissa Nestle   Encounter Date: 05/23/2021   OT End of Session - 05/23/21 1125     Visit Number 14    Number of Visits 15    Date for OT Re-Evaluation 05/30/21    Authorization Type BCBS    Authorization Time Period VL: 30 (PT,OT)    Authorization - Visit Number 14    Authorization - Number of Visits 15    OT Start Time 1149    OT Stop Time 1230    OT Time Calculation (min) 41 min    Activity Tolerance Patient tolerated treatment well    Behavior During Therapy [facility] for tasks assessed/performed             History reviewed. No pertinent past medical history.  Past Surgical History:  Procedure Laterality Date   BUBBLE STUDY  02/21/2021   Procedure: BUBBLE STUDY;  Surgeon: Chilton Si, MD;  Location: [facility] ENDOSCOPY;  Service: Cardiovascular;;   IR ANGIO INTRA EXTRACRAN SEL COM CAROTID INNOMINATE UNI L MOD SED  02/15/2021   IR ANGIO VERTEBRAL SEL VERTEBRAL BILAT MOD SED  02/15/2021   IR CT HEAD LTD  02/15/2021   IR PERCUTANEOUS ART THROMBECTOMY/INFUSION INTRACRANIAL INC DIAG ANGIO  02/15/2021   IR US GUIDE VASC ACCESS RIGHT  02/15/2021   RADIOLOGY WITH ANESTHESIA N/A 02/14/2021   Procedure: IR WITH ANESTHESIA;  Surgeon: Julieanne Cotton, MD;  Location: [facility] OR;  Service: Radiology;  Laterality: N/A;   TEE WITHOUT CARDIOVERSION N/A 02/21/2021   Procedure: TRANSESOPHAGEAL ECHOCARDIOGRAM (TEE);  Surgeon: Chilton Si, MD;  Location: [facility] ENDOSCOPY;  Service: Cardiovascular;  Laterality: N/A;    There were no vitals filed for this visit.   Subjective Assessment - 05/23/21 1148     Subjective  "nothing's new - my son gave me these (pom poms)"    Patient is accompanied by: Family member     Pertinent History PMH: polysubstance abuse/tobacco, migrane headaches and obesity    Limitations Fall Risk, Impulsive, L hemi    Currently in Pain? No/denies    Pain Score 0-No pain                          OT Treatments/Exercises (OP) - 05/23/21 1209       Neurological Re-education Exercises   Other Exercises 1 reaching to floor with Boomwhacker with BUE and with AAROM - pt able to maintain grasp on Boomwhacker without physical assistance. x 10 reps    Other Exercises 2 UE ranger with place and holds with difficulty at first but then able to maintain position with elbow slightly extended with minimal resistance.    Other Weight-Bearing Exercises 1 weight bearing into LUE on mat while reaching and placing Perfection pieces. Identified loction for Perfecrtion pieces with no difficulty and with max A For maintaining hand position on mat.      Modalities   Modalities Geologist, engineering Location LUE   with simultaneous assist for neuromuscular reeducation with volitional wrist and finger extension   Electrical Stimulation Action wrist extension    Electrical Stimulation Parameters Intensity 20 x NMES small muscle    Electrical Stimulation Goals Strength;Neuromuscular facilitation  OT Short Term Goals - 05/21/21 1829       OT SHORT TERM GOAL #1   Title Pt will be independent with initial HEP    Time 4    Period Weeks    Status Achieved   demonstrated supine self PROM HEP 05/09/21   Target Date 04/25/21      OT SHORT TERM GOAL #2   Title Pt will report pain no greater than 7/10 with HEP    Baseline 8/10 with shoulder flexion/abduction    Time 4    Period Weeks    Status Achieved   pain reports no pain with supine HEP     OT SHORT TERM GOAL #3   Title Pt will verbalize and demonstrate understanding of visual scanning startegies for visual field cut.    Baseline left visual  field cut    Time 4    Period Weeks    Status Achieved      OT SHORT TERM GOAL #4   Title Pt will perform environmental scanning with 90% accuracy or greater    Time 4    Period Weeks    Status Achieved   100% on 04/25/21              OT Long Term Goals - 05/21/21 1829       OT LONG TERM GOAL #1   Title Pt will be independent with updated HEP    Time 9    Period Weeks    Status On-going      OT LONG TERM GOAL #2   Title Pt will perform table top scanning with 95% accuracy or greater with use of visual scanning strategies.    Baseline 89% accuracy with 88's    Time 9    Period Weeks    Status On-going      OT LONG TERM GOAL #3   Title Pt will demonstrate ability to grasp and release cylindrical object and/or 1 inch block with LUE.    Baseline trace volitional composite flexion    Time 9    Period Weeks    Status On-going      OT LONG TERM GOAL #4   Title Pt will demonstrate active LUE movement to reach to low level surface for progression to reaching and obtaining objects    Baseline no active flexion    Time 9    Period Weeks    Status On-going      OT LONG TERM GOAL #5   Title PT will demonsrate using LUE as active and functional stabilizer for 20% or greater of ADLs and IADLs.    Baseline 0%    Time 9    Period Weeks    Status On-going                   Plan - 05/23/21 1124     Clinical Impression Statement Pt eager for recovery of movement in LUE. Continues to overactivation in LUE with facilitating any shoulder activation or reach.    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;Decreased knowledge of use of DME;Vision;Sensation;Flexibility;Coordination;IADL;ROM;UE functional use;Tone;Pain;Strength;GMC;Dexterity     Cognitive Skills Attention;Safety Awareness    Rehab Potential Good    Clinical Decision Making Limited treatment options, no task modification necessary  Comorbidities Affecting Occupational Performance: None    Modification or Assistance to Complete Evaluation  No modification of tasks or assist necessary to complete eval    OT Frequency 2x / week    OT Duration Other (comment)   15 visits over 9 weeks d/t any scheduling conflicts   OT Treatment/Interventions Self-care/ADL training;Aquatic Therapy;Electrical Stimulation;Energy conservation;Manual Therapy;Patient/family education;Visual/perceptual remediation/compensation;Passive range of motion;Neuromuscular education;Building services engineer;Therapeutic exercise;Cognitive remediation/compensation;Moist Heat;Fluidtherapy;DME and/or AE instruction;Splinting;Therapeutic activities;Ultrasound    Plan complete recertification next session for 2x/week for 6 weeks in New Year, Aquatics therapy, LUE NMR    Consulted and Agree with Plan of Care Patient    Family Member Consulted spouse             Patient will benefit from skilled therapeutic intervention in order to improve the following deficits and impairments:   Body Structure / Function / Physical Skills: ADL, Decreased knowledge of use of DME, Vision, Sensation, Flexibility, Coordination, IADL, ROM, UE functional use, Tone, Pain, Strength, GMC, Dexterity Cognitive Skills: Attention, Safety Awareness     Visit Diagnosis: Hemiplegia and hemiparesis following other cerebrovascular disease affecting left non-dominant side (HCC)  Attention and concentration deficit  Muscle weakness (generalized)  Other abnormalities of gait and mobility  Unsteadiness on feet  Other symptoms and signs involving the nervous system  Acute pain of left shoulder  Other lack of coordination    Problem List Patient Active Problem List   Diagnosis Date Noted   Sprain of left ankle    Sleep  disturbance    Hyperglycemia    Hypoalbuminemia due to protein-calorie malnutrition (HCC)    Dyslipidemia    Hemiparesis affecting left side as late effect of stroke (HCC)    Lethargy    Cerebrovascular accident (CVA) due to occlusion of right posterior cerebral artery (HCC) 02/22/2021   Cerebrovascular accident (CVA) [facility])    Right carotid artery occlusion 02/14/2021    Junious Dresser, OT 05/23/2021, 12:33 PM  [facility] [address] [place], [place], [address] Phone: [phone]   Fax:  [phone]  Name: Alexy Heldt MRN: [id number] Date of Birth: Aug 19, 1985

## 2021-05-30 ENCOUNTER — Other Ambulatory Visit: Payer: Self-pay

## 2021-05-30 ENCOUNTER — Ambulatory Visit: Payer: BC Managed Care – PPO

## 2021-05-30 ENCOUNTER — Encounter: Payer: BC Managed Care – PPO | Admitting: Speech Pathology

## 2021-05-30 ENCOUNTER — Encounter: Payer: Self-pay | Admitting: Occupational Therapy

## 2021-05-30 ENCOUNTER — Ambulatory Visit: Payer: BC Managed Care – PPO | Admitting: Occupational Therapy

## 2021-05-30 DIAGNOSIS — M25512 Pain in left shoulder: Secondary | ICD-10-CM

## 2021-05-30 DIAGNOSIS — R278 Other lack of coordination: Secondary | ICD-10-CM | POA: Diagnosis not present

## 2021-05-30 DIAGNOSIS — M6281 Muscle weakness (generalized): Secondary | ICD-10-CM | POA: Diagnosis not present

## 2021-05-30 DIAGNOSIS — R2689 Other abnormalities of gait and mobility: Secondary | ICD-10-CM | POA: Diagnosis not present

## 2021-05-30 DIAGNOSIS — R2681 Unsteadiness on feet: Secondary | ICD-10-CM

## 2021-05-30 DIAGNOSIS — R41840 Attention and concentration deficit: Secondary | ICD-10-CM

## 2021-05-30 DIAGNOSIS — I69854 Hemiplegia and hemiparesis following other cerebrovascular disease affecting left non-dominant side: Secondary | ICD-10-CM

## 2021-05-30 DIAGNOSIS — R29818 Other symptoms and signs involving the nervous system: Secondary | ICD-10-CM | POA: Diagnosis not present

## 2021-05-30 NOTE — Patient Outreach (Signed)
[facility]) Care Management  05/30/2021  David Craig [birth year] [phone]    Telephone outreach to patient to obtain mRS was successfully completed. MRS= 3   Vanice Sarah [facility] Care Management Assistant

## 2021-05-30 NOTE — Therapy (Signed)
[facility] [address] [place], [place], [address] Phone: [phone]   Fax:  [phone]  Occupational Therapy Treatment & Recertification  Patient Details  Name: David Craig MRN: [id number] Date of Birth: [birth year] Referring Provider (OT): Algis Liming   Encounter Date: 05/30/2021   OT End of Session - 05/30/21 1147     Visit Number 15    Number of Visits 28   +13 visits at renewal 05/30/21   Date for OT Re-Evaluation 05/30/21    Authorization Type [employer]    Authorization Time Period VL: 30 (PT,OT) VL starts over Jan 2023    Authorization - Visit Number 14    Authorization - Number of Visits 15   starts over at Jan   OT Start Time 1144    OT Stop Time 1230    OT Time Calculation (min) 46 min    Activity Tolerance Patient tolerated treatment well    Behavior During Therapy [facility] for tasks assessed/performed             History reviewed. No pertinent past medical history.  Past Surgical History:  Procedure Laterality Date   BUBBLE STUDY  02/21/2021   Procedure: BUBBLE STUDY;  Surgeon: Skeet Latch, MD;  Location: [facility];  Service: Cardiovascular;;   IR ANGIO INTRA EXTRACRAN SEL COM CAROTID INNOMINATE UNI L MOD SED  02/15/2021   IR ANGIO VERTEBRAL SEL VERTEBRAL BILAT MOD SED  02/15/2021   IR CT HEAD LTD  02/15/2021   IR PERCUTANEOUS ART THROMBECTOMY/INFUSION INTRACRANIAL INC DIAG ANGIO  02/15/2021   IR US GUIDE VASC ACCESS RIGHT  02/15/2021   RADIOLOGY WITH ANESTHESIA N/A 02/14/2021   Procedure: IR WITH ANESTHESIA;  Surgeon: Luanne Bras, MD;  Location: [facility];  Service: Radiology;  Laterality: N/A;   TEE WITHOUT CARDIOVERSION N/A 02/21/2021   Procedure: TRANSESOPHAGEAL ECHOCARDIOGRAM (TEE);  Surgeon: Skeet Latch, MD;  Location: [facility];  Service: Cardiovascular;  Laterality: N/A;    There were no vitals filed for this visit.   Subjective Assessment - 05/30/21 1146     Subjective  "got  to see family and all that stuff" "i just want my arm to be back to normal - that's the hardest part right now"    Pertinent History PMH: polysubstance abuse/tobacco, migrane headaches and obesity    Limitations Fall Risk, Impulsive, L hemi    Currently in Pain? No/denies                          OT Treatments/Exercises (OP) - 05/30/21 1217       ADLs   Overall ADLs assessed goals and made adjustments to goals to suit progress for patient at this time.      Visual/Perceptual Exercises   Scanning Tabletop    Scanning - Tabletop 88's cancellation with 98% accuracy      Neurological Re-education Exercises   Weight Bearing Position Quadraped    Quadraped with weight on forearm with taking color dowel out of board and placing back in with RUE. Pt also did dowels x 10 with RUE while in full quadruped with max A for support of LUE.                      OT Short Term Goals - 05/21/21 1829       OT SHORT TERM GOAL #1   Title Pt will be independent with initial HEP    Time 4  Period Weeks    Status Achieved   demonstrated supine self PROM HEP 05/09/21   Target Date 04/25/21      OT SHORT TERM GOAL #2   Title Pt will report pain no greater than 7/10 with HEP    Baseline 8/10 with shoulder flexion/abduction    Time 4    Period Weeks    Status Achieved   pain reports no pain with supine HEP     OT SHORT TERM GOAL #3   Title Pt will verbalize and demonstrate understanding of visual scanning startegies for visual field cut.    Baseline left visual field cut    Time 4    Period Weeks    Status Achieved      OT SHORT TERM GOAL #4   Title Pt will perform environmental scanning with 90% accuracy or greater    Time 4    Period Weeks    Status Achieved   100% on 04/25/21              OT Long Term Goals - 05/30/21 1154       OT LONG TERM GOAL #1   Title Pt will be independent with updated HEP    Time 9    Period Weeks    Status On-going       OT LONG TERM GOAL #2   Title Pt will perform table top scanning with 95% accuracy or greater with use of visual scanning strategies.    Baseline 89% accuracy with 88's    Time 9    Period Weeks    Status Achieved   98% accuracy on 05/30/21     OT LONG TERM GOAL #3   Title Pt will demonstrate ability to grasp and release cylindrical object and/or 1 inch block with LUE.    Baseline trace volitional composite flexion    Time 9    Period Weeks    Status On-going   pt able to grasp d/t tone but unable to release. 05/30/21     OT LONG TERM GOAL #4   Title Pt will demonstrate active LUE movement to reach to low level surface for progression to reaching and obtaining objects    Baseline no active flexion    Time 9    Period Weeks    Status On-going   no volitional shoulder flexion present 05/30/21     OT LONG TERM GOAL #5   Title PT will demonsrate using LUE as active and functional stabilizer for 20% or greater of ADLs and IADLs.    Baseline 0%    Time 9    Period Weeks    Status Not Met      OT LONG TERM GOAL #6   Title Pt will verbalize understanding of adapted strategies and/or equipment for increasing independence with ADLs and IADLs. (LB dressing, bathing, opening containers, cutting up food, etc)    Time 8    Period Weeks    Status New    Target Date 07/25/21      OT LONG TERM GOAL #7   Title Pt will report completing basic ADLs with set up assistance only and assistance with compression stockings.    Time 8    Period Weeks    Status New                   Plan - 05/30/21 1151     Clinical Impression Statement This note serves as recertification for this patient for  continued skilled occupational therapy. Pt has made some progress and is benefitting from therapy for neuromuscular reeducation for LUE. Pt is also benefitting from aquatics therapy for LUE and balance and distribution of muscle activation and use. Pt has met all STGs and is progressing towards LTGs.  Goals added for increasing independence with ADLs and IADLs with adapted strategies and/or equipment PRN. Skilled occupational therapy continues to be beneficial for increasing independence with ADLs and neuromuscular reeducation for LUE.    OT Occupational Profile and History Problem Focused Assessment - Including review of records relating to presenting problem    Occupational performance deficits (Please refer to evaluation for details): IADL's;ADL's;Leisure;Work    Marketing executive / Function / Physical Skills ADL;Decreased knowledge of use of DME;Vision;Sensation;Flexibility;Coordination;IADL;ROM;UE functional use;Tone;Pain;Strength;GMC;Dexterity    Cognitive Skills Attention;Safety Awareness    Rehab Potential Good    Clinical Decision Making Limited treatment options, no task modification necessary    Comorbidities Affecting Occupational Performance: None    Modification or Assistance to Complete Evaluation  No modification of tasks or assist necessary to complete eval    OT Frequency 2x / week    OT Duration 8 weeks   13 visits over 8 weeks d/t any scheduling conflicts at renewal   OT Treatment/Interventions Self-care/ADL training;Aquatic Therapy;Electrical Stimulation;Energy conservation;Manual Therapy;Patient/family education;Visual/perceptual remediation/compensation;Passive range of motion;Neuromuscular education;Therapist, nutritional;Therapeutic exercise;Cognitive remediation/compensation;Moist Heat;Fluidtherapy;DME and/or AE instruction;Splinting;Therapeutic activities;Ultrasound    Plan recert completed 11/55/20, Aquatics therapy, LUE NMR    Consulted and Agree with Plan of Care Patient    Family Member Consulted spouse             Patient will benefit from skilled therapeutic intervention in order to improve the following deficits and impairments:   Body Structure / Function / Physical Skills: ADL, Decreased knowledge of use of DME, Vision, Sensation, Flexibility,  Coordination, IADL, ROM, UE functional use, Tone, Pain, Strength, GMC, Dexterity Cognitive Skills: Attention, Safety Awareness     Visit Diagnosis: Hemiplegia and hemiparesis following other cerebrovascular disease affecting left non-dominant side (HCC)  Attention and concentration deficit  Muscle weakness (generalized)  Other abnormalities of gait and mobility  Unsteadiness on feet  Other symptoms and signs involving the nervous system  Acute pain of left shoulder  Other lack of coordination    Problem List Patient Active Problem List   Diagnosis Date Noted   Sprain of left ankle    Sleep disturbance    Hyperglycemia    Hypoalbuminemia due to protein-calorie malnutrition (HCC)    Dyslipidemia    Hemiparesis affecting left side as late effect of stroke ([place])    Lethargy    Cerebrovascular accident (CVA) due to occlusion of right posterior cerebral artery ([place]) 02/22/2021   Cerebrovascular accident (CVA) [facility])    Right carotid artery occlusion 02/14/2021    Zachery Conch, OT 05/30/2021, 1:37 PM  [facility] [address] [place], [place], [address] Phone: [phone]   Fax:  [phone]  Name: David Craig MRN: [id number] Date of Birth: 08-19-1985

## 2021-05-31 ENCOUNTER — Telehealth: Payer: Self-pay | Admitting: Adult Health

## 2021-05-31 NOTE — Telephone Encounter (Signed)
Rx was sent 05/14/2021 QUEtiapine (SEROQUEL) 50 MG tablet 45 tablet 5 05/14/2021    Sig - Route: Take 1.5 tablets (75 mg total) by mouth at bedtime.

## 2021-05-31 NOTE — Telephone Encounter (Signed)
Pt's wife Seward Grater called states the QUEtiapine (SEROQUEL) 50 MG tablet is not working for her husband. Asking if he can have a refill for the 75 MG. Pharmacy [facility] - [place] - [address]

## 2021-06-01 ENCOUNTER — Ambulatory Visit: Payer: BC Managed Care – PPO | Admitting: Occupational Therapy

## 2021-06-01 ENCOUNTER — Ambulatory Visit: Payer: BC Managed Care – PPO

## 2021-06-01 DIAGNOSIS — I69998 Other sequelae following unspecified cerebrovascular disease: Secondary | ICD-10-CM | POA: Diagnosis not present

## 2021-06-01 DIAGNOSIS — I639 Cerebral infarction, unspecified: Secondary | ICD-10-CM | POA: Diagnosis not present

## 2021-06-01 DIAGNOSIS — G510 Bell's palsy: Secondary | ICD-10-CM | POA: Diagnosis not present

## 2021-06-05 ENCOUNTER — Ambulatory Visit: Payer: BC Managed Care – PPO | Admitting: Occupational Therapy

## 2021-06-07 ENCOUNTER — Ambulatory Visit: Payer: BC Managed Care – PPO | Attending: Physician Assistant | Admitting: Occupational Therapy

## 2021-06-07 ENCOUNTER — Other Ambulatory Visit: Payer: Self-pay

## 2021-06-07 ENCOUNTER — Encounter: Payer: Self-pay | Admitting: Occupational Therapy

## 2021-06-07 DIAGNOSIS — R2681 Unsteadiness on feet: Secondary | ICD-10-CM

## 2021-06-07 DIAGNOSIS — M6281 Muscle weakness (generalized): Secondary | ICD-10-CM | POA: Diagnosis not present

## 2021-06-07 DIAGNOSIS — R41840 Attention and concentration deficit: Secondary | ICD-10-CM

## 2021-06-07 DIAGNOSIS — M25512 Pain in left shoulder: Secondary | ICD-10-CM

## 2021-06-07 DIAGNOSIS — R278 Other lack of coordination: Secondary | ICD-10-CM | POA: Diagnosis not present

## 2021-06-07 DIAGNOSIS — I69854 Hemiplegia and hemiparesis following other cerebrovascular disease affecting left non-dominant side: Secondary | ICD-10-CM

## 2021-06-07 DIAGNOSIS — R29818 Other symptoms and signs involving the nervous system: Secondary | ICD-10-CM | POA: Diagnosis not present

## 2021-06-07 NOTE — Therapy (Signed)
[facility] [address] [place], [place], [address] Phone: [phone]   Fax:  [phone]  Occupational Therapy Treatment  Patient Details  Name: David Craig MRN: [id number] Date of Birth: [birth year] Referring Provider (OT): Algis Liming   Encounter Date: 06/07/2021   OT End of Session - 06/07/21 1237     Visit Number [place] - Visit Number 15    Authorization - Number of Visits 15    OT Start Time 7062    OT Stop Time 1230    OT Time Calculation (min) 45 min    Activity Tolerance Patient tolerated treatment well    Behavior During Therapy [facility] for tasks assessed/performed             History reviewed. No pertinent past medical history.  Past Surgical History:  Procedure Laterality Date   BUBBLE STUDY  02/21/2021   Procedure: BUBBLE STUDY;  Surgeon: Skeet Latch, MD;  Location: [facility];  Service: Cardiovascular;;   IR ANGIO INTRA EXTRACRAN SEL COM CAROTID INNOMINATE UNI L MOD SED  02/15/2021   IR ANGIO VERTEBRAL SEL VERTEBRAL BILAT MOD SED  02/15/2021   IR CT HEAD LTD  02/15/2021   IR PERCUTANEOUS ART THROMBECTOMY/INFUSION INTRACRANIAL INC DIAG ANGIO  02/15/2021   IR US GUIDE VASC ACCESS RIGHT  02/15/2021   RADIOLOGY WITH ANESTHESIA N/A 02/14/2021   Procedure: IR WITH ANESTHESIA;  Surgeon: Luanne Bras, MD;  Location: [facility];  Service: Radiology;  Laterality: N/A;   TEE WITHOUT CARDIOVERSION N/A 02/21/2021   Procedure: TRANSESOPHAGEAL ECHOCARDIOGRAM (TEE);  Surgeon: Skeet Latch, MD;  Location: [facility];  Service: Cardiovascular;  Laterality: N/A;    There were no vitals filed for this visit.   Subjective Assessment - 06/07/21 1149     Subjective  Patient indicates no pain    Patient is accompanied by: Family member    Pertinent History PMH: polysubstance abuse/tobacco, migrane headaches and obesity    Limitations Fall Risk, Impulsive, L hemi     Patient Stated Goals "get back to normal self" and to "be able to walk to bathroom and pee and pick up my kids".    Currently in Pain? No/denies    Pain Score 0-No pain                          OT Treatments/Exercises (OP) - 06/07/21 0001       ADLs   UB Dressing Reviewed remaining LTG's with patient.  When asked if patient was dressing himself at home - he indicated he can, but that he does not consistently - becasue it takes too long.  Long discussion with patient about the importance of practice to improve this function - offered startegies - like timing himself to determine improvement in performance.  Patient agrees to continue to work toward increased independence with LB dressing.    ADL Comments Patient has met his therapy max for this year (not a calendar year)  He is stopping therapy at this time, and hopes to restart in July - start of benefit year.  Patient is pursuing [employer].  Reviewed with patient that he sees physiatrist next week - and that he may discuss medication options to control spasticity in left arm.      Neurological Re-education Exercises   Other Exercises 1 Neuromuscular reeducation to address graded control of grasp in hopes of gaining some digit extension (versus  active relaxation of flexors)                    OT Education - 06/07/21 1237     Education Details reviewed reamining goals and exercise program.    Person(s) Educated Patient    Methods Explanation    Comprehension Verbalized understanding              OT Short Term Goals - 06/07/21 1150       OT SHORT TERM GOAL #1   Title Pt will be independent with initial HEP    Time 4    Period Weeks    Status Achieved   demonstrated supine self PROM HEP 05/09/21   Target Date 04/25/21      OT SHORT TERM GOAL #2   Title Pt will report pain no greater than 7/10 with HEP    Baseline 8/10 with shoulder flexion/abduction    Time 4    Period Weeks    Status Achieved   pain  reports no pain with supine HEP     OT SHORT TERM GOAL #3   Title Pt will verbalize and demonstrate understanding of visual scanning startegies for visual field cut.    Baseline left visual field cut    Time 4    Period Weeks    Status Achieved      OT SHORT TERM GOAL #4   Title Pt will perform environmental scanning with 90% accuracy or greater    Time 4    Period Weeks    Status Achieved   100% on 04/25/21              OT Long Term Goals - 06/07/21 1151       OT LONG TERM GOAL #1   Title Pt will be independent with updated HEP    Time 9    Period Weeks    Status Achieved      OT LONG TERM GOAL #2   Title Pt will perform table top scanning with 95% accuracy or greater with use of visual scanning strategies.    Baseline 89% accuracy with 88's    Time 9    Period Weeks    Status Achieved   98% accuracy on 05/30/21     OT LONG TERM GOAL #3   Title Pt will demonstrate ability to grasp and release cylindrical object and/or 1 inch block with LUE.    Baseline trace volitional composite flexion    Time 9    Period Weeks    Status Not Met   pt able to grasp d/t tone but unable to release. 05/30/21     OT LONG TERM GOAL #4   Title Pt will demonstrate active LUE movement to reach to low level surface for progression to reaching and obtaining objects    Baseline no active flexion    Time 9    Period Weeks    Status Not Met   no volitional shoulder flexion present 05/30/21     OT LONG TERM GOAL #5   Title PT will demonsrate using LUE as active and functional stabilizer for 20% or greater of ADLs and IADLs.    Baseline 0%    Time 9    Period Weeks    Status Not Met      OT LONG TERM GOAL #6   Title Pt will verbalize understanding of adapted strategies and/or equipment for increasing independence with ADLs and IADLs. (LB dressing, bathing, opening  containers, cutting up food, etc)    Time 8    Period Weeks    Status Partially Met    Target Date 07/25/21      OT  LONG TERM GOAL #7   Title Pt will report completing basic ADLs with set up assistance only and assistance with compression stockings.    Time 8    Period Weeks    Status Partially Met                   Plan - 06/07/21 1238     Clinical Impression Statement Patient is discharging from OT as he has reached his visit maximum at this time.  Patient is a great candidate to return to therapy if situation changes ,or in new benefit year.    OT Occupational Profile and History Problem Focused Assessment - Including review of records relating to presenting problem    Occupational performance deficits (Please refer to evaluation for details): ADL's;IADL's;Work;Leisure    Body Structure / Function / Physical Skills ADL;Decreased knowledge of use of DME;Vision;Sensation;Flexibility;Coordination;IADL;ROM;UE functional use;Tone;Pain;Strength;GMC;Dexterity    Cognitive Skills Attention;Safety Awareness    Rehab Potential Good    Clinical Decision Making Limited treatment options, no task modification necessary    Comorbidities Affecting Occupational Performance: None    Modification or Assistance to Complete Evaluation  No modification of tasks or assist necessary to complete eval    OT Frequency 2x / week    OT Duration 8 weeks    OT Treatment/Interventions Self-care/ADL training;Aquatic Therapy;Electrical Stimulation;Energy conservation;Manual Therapy;Patient/family education;Visual/perceptual remediation/compensation;Passive range of motion;Neuromuscular education;Therapist, nutritional;Therapeutic exercise;Cognitive remediation/compensation;Moist Heat;Fluidtherapy;DME and/or AE instruction;Splinting;Therapeutic activities;Ultrasound    Plan discharge    Consulted and Agree with Plan of Care Patient             Patient will benefit from skilled therapeutic intervention in order to improve the following deficits and impairments:   Body Structure / Function / Physical Skills: ADL,  Decreased knowledge of use of DME, Vision, Sensation, Flexibility, Coordination, IADL, ROM, UE functional use, Tone, Pain, Strength, GMC, Dexterity Cognitive Skills: Attention, Safety Awareness     Visit Diagnosis: Hemiplegia and hemiparesis following other cerebrovascular disease affecting left non-dominant side (HCC)  Muscle weakness (generalized)  Unsteadiness on feet  Other symptoms and signs involving the nervous system  Acute pain of left shoulder  Other lack of coordination  Attention and concentration deficit    Problem List Patient Active Problem List   Diagnosis Date Noted   Sprain of left ankle    Sleep disturbance    Hyperglycemia    Hypoalbuminemia due to protein-calorie malnutrition (HCC)    Dyslipidemia    Hemiparesis affecting left side as late effect of stroke (HCC)    Lethargy    Cerebrovascular accident (CVA) due to occlusion of right posterior cerebral artery ([place]) 02/22/2021   Cerebrovascular accident (CVA) ([place])    Right carotid artery occlusion 02/14/2021  OCCUPATIONAL THERAPY DISCHARGE SUMMARY  Visits from Start of Care: 15  Current functional level related to goals / functional outcomes: Improved ability to complete ADL's, but inconsistent day to day performance, decreased shoulder pain   Remaining deficits: Left hemiplegia   Education / Equipment: HEP LUE   Patient agrees to discharge. Patient goals were partially met. Patient is being discharged due to financial reasons.David Craig, OT 06/07/2021, 12:42 PM  [facility] [address] [place], [place], [address] Phone: [phone]   Fax:  [phone]  Name: David Craig MRN: [id number] Date of Birth: 1985-07-31

## 2021-06-11 ENCOUNTER — Ambulatory Visit: Payer: BC Managed Care – PPO | Admitting: Occupational Therapy

## 2021-06-13 ENCOUNTER — Telehealth: Payer: Self-pay | Admitting: *Deleted

## 2021-06-13 ENCOUNTER — Other Ambulatory Visit: Payer: Self-pay

## 2021-06-13 ENCOUNTER — Encounter: Payer: Self-pay | Admitting: Physical Medicine & Rehabilitation

## 2021-06-13 ENCOUNTER — Encounter: Payer: BC Managed Care – PPO | Attending: Registered Nurse | Admitting: Physical Medicine & Rehabilitation

## 2021-06-13 VITALS — BP 130/90 | HR 72 | Ht 69.0 in | Wt 245.0 lb

## 2021-06-13 DIAGNOSIS — Z720 Tobacco use: Secondary | ICD-10-CM | POA: Insufficient documentation

## 2021-06-13 DIAGNOSIS — S43002S Unspecified subluxation of left shoulder joint, sequela: Secondary | ICD-10-CM | POA: Diagnosis not present

## 2021-06-13 DIAGNOSIS — H53462 Homonymous bilateral field defects, left side: Secondary | ICD-10-CM | POA: Diagnosis not present

## 2021-06-13 DIAGNOSIS — R252 Cramp and spasm: Secondary | ICD-10-CM | POA: Diagnosis not present

## 2021-06-13 DIAGNOSIS — S43002A Unspecified subluxation of left shoulder joint, initial encounter: Secondary | ICD-10-CM | POA: Insufficient documentation

## 2021-06-13 DIAGNOSIS — I69354 Hemiplegia and hemiparesis following cerebral infarction affecting left non-dominant side: Secondary | ICD-10-CM | POA: Diagnosis not present

## 2021-06-13 MED ORDER — TIZANIDINE HCL 2 MG PO CAPS: 2.0000 mg | ORAL_CAPSULE | Freq: Three times a day (TID) | ORAL | 4 refills | Status: DC

## 2021-06-13 MED ORDER — BUPROPION HCL ER (XL) 150 MG PO TB24: 150.0000 mg | ORAL_TABLET | ORAL | 2 refills | Status: DC

## 2021-06-13 NOTE — Progress Notes (Signed)
Subjective:    Patient ID: David Craig, male    DOB: 10-30-1985, 36 y.o.   MRN: [id number]  HPI  David Craig is here in follow up of his right PCA infarct. He recently ran out of his outpt therapy days. His last day was 06/07/21. His therapy benefits reboot in the summer.  He is doing some exercises on his own at home which includes and stretching.  He does 20 minutes in his upper extremities about 20 minutes in his lower extremity each day.  He is not doing much else around the house apparently.   He denies pain currently. He is sleeping most nights. When he can't he uses seroquel.  Wife does report that he is smoking again smoking up to 3 times daily.   His mood has been fair.  He is frustrated that he still has disability and that he is unable to drive.  He has seen a neuro-ophthalmologist in the use to present with a significant hemianopsia.  He was given a prescription for prism lenses apparently.  Bowel bladder function are intact.  Appetite is good.  There are no issues with swallowing.  Pain Inventory Average Pain 0 Pain Right Now 0 My pain is  no pain  LOCATION OF PAIN  no pain  BOWEL Number of stools per week: 4 Oral laxative use No  Type of laxative . Enema or suppository use No  History of colostomy No  Incontinent No   BLADDER Normal In and out cath, frequency . Able to self cath  na Bladder incontinence No  Frequent urination No  Leakage with coughing No  Difficulty starting stream No  Incomplete bladder emptying No    Mobility use a cane how many minutes can you walk? 5 ability to climb steps?  yes do you drive?  no  Function disabled: date disabled 02/13/21 I need assistance with the following:  dressing, bathing, meal prep, household duties, and shopping  Neuro/Psych weakness trouble walking  Prior Studies Any changes since last visit?  no  Physicians involved in your care Any changes since last visit?  no   No family history on file. Social  History   Socioeconomic History   Marital status: Single    Spouse name: Not on file   Number of children: 2   Years of education: Not on file   Highest education level: Not on file  Occupational History   Not on file  Tobacco Use   Smoking status: Some Days    Types: Cigarettes    Last attempt to quit: 03/15/2015    Years since quitting: 6.2   Smokeless tobacco: Never  Vaping Use   Vaping Use: Former  Substance and Sexual Activity   Alcohol use: Yes    Comment: occasional   Drug use: Yes    Types: Cocaine, Marijuana, Oxycodone   Sexual activity: Yes    Partners: Female  Other Topics Concern   Not on file  Social History Narrative   Not on file   Social Determinants of Health   Financial Resource Strain: Not on file  Food Insecurity: Not on file  Transportation Needs: Not on file  Physical Activity: Not on file  Stress: Not on file  Social Connections: Not on file   Past Surgical History:  Procedure Laterality Date   BUBBLE STUDY  02/21/2021   Procedure: BUBBLE STUDY;  Surgeon: Skeet Latch, MD;  Location: [place];  Service: Cardiovascular;;   IR ANGIO INTRA EXTRACRAN SEL COM CAROTID  INNOMINATE UNI L MOD SED  02/15/2021   IR ANGIO VERTEBRAL SEL VERTEBRAL BILAT MOD SED  02/15/2021   IR CT HEAD LTD  02/15/2021   IR PERCUTANEOUS ART THROMBECTOMY/INFUSION INTRACRANIAL INC DIAG ANGIO  02/15/2021   IR US GUIDE VASC ACCESS RIGHT  02/15/2021   RADIOLOGY WITH ANESTHESIA N/A 02/14/2021   Procedure: IR WITH ANESTHESIA;  Surgeon: Luanne Bras, MD;  Location: [place];  Service: Radiology;  Laterality: N/A;   TEE WITHOUT CARDIOVERSION N/A 02/21/2021   Procedure: TRANSESOPHAGEAL ECHOCARDIOGRAM (TEE);  Surgeon: Skeet Latch, MD;  Location[facility] ENDOSCOPY;  Service: Cardiovascular;  Laterality: N/A;   No past medical history on file. BP 130/90    Pulse 72    Ht 5\' 9"  (1.753 m)    Wt 245 lb (111.1 kg)    SpO2 95%    BMI 36.18 kg/m   Opioid Risk Score:   Fall Risk  Score:  `1  Depression screen PHQ 2/9  Depression screen [facility] 2/9 06/13/2021 04/03/2021  Decreased Interest 0 0  Down, Depressed, Hopeless 0 0  PHQ - 2 Score 0 0  Altered sleeping - 1  Tired, decreased energy - 0  Change in appetite - 0  Feeling bad or failure about yourself  - 0  Trouble concentrating - 0  Moving slowly or fidgety/restless - 2  Suicidal thoughts - 0  PHQ-9 Score - 3     Review of Systems  Musculoskeletal:  Positive for gait problem.  Neurological:  Positive for weakness.  All other systems reviewed and are negative.     Objective:   Physical Exam  General: No acute distress HEENT: NCAT, EOMI, oral membranes moist Cards: reg rate  Chest: normal effort Abdomen: Soft, NT, ND Skin: dry, intact Extremities: no edema Psych: pleasant and appropriate  Neuro: Patient is alert and oriented x3.  Has ongoing left homonymous hemianopsia.  Left upper extremity strength is 1+ to 2 proximal to 1+ distally in a flexor pattern.  He has some flexor tone clinically at the wrist and fingers at 1+ out of 4.  Left lower extremity is 3 out of 5 hip flexors and knee extensors as well as 2+ out of 5 plantar flexion and 0 to trace ankle dorsiflexion.  There is trace extensor tone in the left leg.  Sensation slightly diminished on the left side once again to pinprick and light touch.  Deep tendon reflexes are 3+. Musculoskeletal: Half inch subluxation of the left shoulder present with some pain and passive range of motion at the shoulder.    Medical Problem List and Plan:  1.  Left-sided hemiparesis secondary to right PCA infarction secondary to terminal right ICA occlusion status post failed mechanical thrombectomy etiology likely secondary to cocaine use/vasospasm  -Patient is making continued progress.  Unfortunately he is out of therapy visits.  I would advocate for him continuing therapy to address left upper extremity functional mobility as well as tone in addition to his  gait.  -Discussed possibility of therapy at [facility] or [place]  -Needs follow-up with [facility].  Made referral today. He remains on 325mg  ECASA daily  -Discussed driving today.  He is unable to drive given his left homonymous hemianopsia.  We will continue to monitor.  He has been given a prescription for prism lenses which should help expanding his left visual field somewhat. 2.  Antithrombotics:               -antiplatelet therapy: Aspirin 81  mg daily and Plavix 75 mg daily x90 days then aspirin alone 3.  Spasticity:  -Fairly mild.  Begin trial of tizanidine 2 mg at bedtime titrating up to 3 times daily over 6 days.  Would avoid Botox at this point as I do not want to inhibit any of his functional hand/finger flexion.  -Continue splinting and range of motion  -Discussed ways to improve subluxation and left shoulder including shoulder sling and harness.  He is to keep arm supported also when sitting or lying. 4. Mood/sleep-wake:               -Maintain Seroquel for sleep  8.  Polysubstance abuse.  Discussed smoking today.  Patient still is smoking cigarettes up to 3 times a day.   -Begin trial of Wellbutrin 150 mg XL daily    At least 35 total minutes were spent in examination of patient, assessment of pertinent data,  formulation of a treatment plan, and in discussion with patient and/or family.  F/U IN 2 MOS

## 2021-06-13 NOTE — Patient Instructions (Addendum)
SHOULDER HARNESS (FIGURE 8) OR SLING  KEEP LEFT ARM ON PILLOW OR ARM REST WHEN YOUR'E SITTING  [employer] [employer]   APPEAL TO INSURANCE?

## 2021-06-13 NOTE — Telephone Encounter (Signed)
Mr Vitarelli called with a question for Dr Naaman Plummer about when he can return to work?

## 2021-06-14 ENCOUNTER — Ambulatory Visit: Payer: BC Managed Care – PPO | Admitting: Occupational Therapy

## 2021-06-14 ENCOUNTER — Ambulatory Visit: Payer: BC Managed Care – PPO | Admitting: Physical Therapy

## 2021-06-14 NOTE — Telephone Encounter (Signed)
Left Dr Naaman Plummer response on Mr Dobek's VM per DPR.

## 2021-06-18 ENCOUNTER — Ambulatory Visit: Payer: BC Managed Care – PPO | Admitting: Occupational Therapy

## 2021-06-19 DIAGNOSIS — G4700 Insomnia, unspecified: Secondary | ICD-10-CM | POA: Diagnosis not present

## 2021-06-19 DIAGNOSIS — E7800 Pure hypercholesterolemia, unspecified: Secondary | ICD-10-CM | POA: Diagnosis not present

## 2021-06-19 DIAGNOSIS — M21372 Foot drop, left foot: Secondary | ICD-10-CM | POA: Diagnosis not present

## 2021-06-19 DIAGNOSIS — I679 Cerebrovascular disease, unspecified: Secondary | ICD-10-CM | POA: Diagnosis not present

## 2021-06-20 ENCOUNTER — Encounter: Payer: Self-pay | Admitting: Physical Medicine & Rehabilitation

## 2021-06-21 ENCOUNTER — Ambulatory Visit: Payer: BC Managed Care – PPO | Admitting: Physical Therapy

## 2021-06-21 ENCOUNTER — Encounter: Payer: BC Managed Care – PPO | Admitting: Occupational Therapy

## 2021-06-21 DIAGNOSIS — I63531 Cerebral infarction due to unspecified occlusion or stenosis of right posterior cerebral artery: Secondary | ICD-10-CM | POA: Diagnosis not present

## 2021-06-25 ENCOUNTER — Ambulatory Visit: Payer: BC Managed Care – PPO | Admitting: Occupational Therapy

## 2021-06-28 ENCOUNTER — Encounter: Payer: BC Managed Care – PPO | Admitting: Occupational Therapy

## 2021-06-28 ENCOUNTER — Ambulatory Visit: Payer: BC Managed Care – PPO

## 2021-07-02 ENCOUNTER — Ambulatory Visit: Payer: BC Managed Care – PPO | Admitting: Occupational Therapy

## 2021-07-05 ENCOUNTER — Ambulatory Visit: Payer: BC Managed Care – PPO

## 2021-07-05 ENCOUNTER — Ambulatory Visit: Payer: BC Managed Care – PPO | Admitting: Occupational Therapy

## 2021-07-06 ENCOUNTER — Encounter: Payer: Self-pay | Admitting: Physical Medicine & Rehabilitation

## 2021-07-09 ENCOUNTER — Ambulatory Visit: Payer: BC Managed Care – PPO | Admitting: Occupational Therapy

## 2021-07-12 ENCOUNTER — Encounter: Payer: Self-pay | Admitting: Adult Health

## 2021-07-12 ENCOUNTER — Encounter: Payer: BC Managed Care – PPO | Admitting: Occupational Therapy

## 2021-07-12 ENCOUNTER — Ambulatory Visit: Payer: BC Managed Care – PPO | Admitting: Physical Therapy

## 2021-07-16 NOTE — Telephone Encounter (Signed)
Not sure indication behind repeat MRI a few months post stroke? I am unable to find this noted by Dr. Roda Shutters while inpatient. Does he know why they were recommending this? Looks like PMR placed referral last month to be seen by Dr. Pearlean Brownie for stroke follow up but denied as he has since been seen since his hospitalization. If he is wishing to be seen by Dr. Pearlean Brownie, visit can be rescheduled to be seen by him. Use of statins after a stroke is recommended for life unless unable to tolerate or contraindicated in the future as studies have shown statin use greatly decreases risk of recurrent strokes in the future. Repeat lipid panel is usually completed by your PCP who also takes over management of statins.

## 2021-07-19 ENCOUNTER — Ambulatory Visit: Payer: BC Managed Care – PPO

## 2021-07-22 DIAGNOSIS — I63531 Cerebral infarction due to unspecified occlusion or stenosis of right posterior cerebral artery: Secondary | ICD-10-CM | POA: Diagnosis not present

## 2021-07-25 ENCOUNTER — Encounter: Payer: Self-pay | Admitting: Physical Medicine & Rehabilitation

## 2021-07-25 DIAGNOSIS — I69354 Hemiplegia and hemiparesis following cerebral infarction affecting left non-dominant side: Secondary | ICD-10-CM

## 2021-07-26 ENCOUNTER — Ambulatory Visit: Payer: BC Managed Care – PPO

## 2021-07-29 NOTE — Telephone Encounter (Signed)
Referral for [facility] was entered in system. He'll need it faxed over to [facility].  Thanks!

## 2021-08-02 ENCOUNTER — Ambulatory Visit: Payer: BC Managed Care – PPO | Admitting: Physical Therapy

## 2021-08-02 ENCOUNTER — Encounter: Payer: Self-pay | Admitting: Adult Health

## 2021-08-02 ENCOUNTER — Ambulatory Visit (INDEPENDENT_AMBULATORY_CARE_PROVIDER_SITE_OTHER): Payer: BC Managed Care – PPO | Admitting: Adult Health

## 2021-08-02 VITALS — BP 131/88 | HR 62 | Ht 69.0 in | Wt 240.0 lb

## 2021-08-02 DIAGNOSIS — H53462 Homonymous bilateral field defects, left side: Secondary | ICD-10-CM | POA: Diagnosis not present

## 2021-08-02 DIAGNOSIS — I63531 Cerebral infarction due to unspecified occlusion or stenosis of right posterior cerebral artery: Secondary | ICD-10-CM

## 2021-08-02 DIAGNOSIS — G8114 Spastic hemiplegia affecting left nondominant side: Secondary | ICD-10-CM

## 2021-08-02 NOTE — Patient Instructions (Addendum)
Continue aspirin 325 mg daily  and atorvastatin for secondary stroke prevention - recommend continued use of these medications life long ? ?Continue to follow with ophthalmology as advised ? ?Continue to follow up with PCP regarding cholesterol and blood pressure management  ?Maintain strict control of hypertension with blood pressure goal below 130/90 and cholesterol with LDL cholesterol (bad cholesterol) goal below 70 mg/dL.  ? ?Signs of a Stroke? Follow the BEFAST method:  ?Balance Watch for a sudden loss of balance, trouble with coordination or vertigo ?Eyes Is there a sudden loss of vision in one or both eyes? Or double vision?  ?Face: Ask the person to smile. Does one side of the face droop or is it numb?  ?Arms: Ask the person to raise both arms. Does one arm drift downward? Is there weakness or numbness of a leg? ?Speech: Ask the person to repeat a simple phrase. Does the speech sound slurred/strange? Is the person confused ? ?Time: If you observe any of these signs, call 911. ? ? ? ? ? ? ? ?Thank you for coming to see [place] at [facility]. I hope we have been able to provide you high quality care today. ? ?You may receive a patient satisfaction survey over the next few weeks. We would appreciate your feedback and comments so that we may continue to improve ourselves and the health of our patients. ? ? ? ? ? ? ?

## 2021-08-02 NOTE — Progress Notes (Signed)
[name] [address] [place]. [place] [address] [phone]       STROKE FOLLOW UP NOTE  [name] Date of Birth:  [birth year] Medical Record Number:  [id number]    Primary neurologist: Dr. Leonie Craig Reason for Referral: stroke follow up    SUBJECTIVE:   CHIEF COMPLAINT:  Chief Complaint  Patient presents with   Follow-up    RM 3 with spouse David Craig Pt is well and stable, no new concerns     HPI:   Update 08/02/2021 JM: 36 year old with history of right PCA infarcts in 02/2021 who returns for 56-monthstroke follow-up.  Overall stable from stroke standpoint without new stroke/TIA symptoms.  Reports residual left sided weakness and visual impairment (left hemianopia and blurred at certain distance). Was discharged from therapies as he reached maximum visits - he does not believe he has made any additional progress since completion of therapies.  Ambulating with a cane and AFO brace, no recent falls.  [facility] working [facility] [facility] trying to start working with [employer]  His insurance will cover additional therapy sessions this summer.  Has been seen by neuro-ophthalmology, was provided prism lenses but denies benefit. Has f/u visit in May/June.  Has not yet returned back to work or driving.  Compliant on aspirin and atorvastatin without side effects.  Blood pressure today 131/88.  Reports complete tobacco cessation over the past 2 months. Occasional THC use. No additional cocaine use and rare use of alcohol.  No new concerns at this time.     History provided for reference purposes only Initial visit 05/03/2021 JM: Patient being seen for initial hospital follow-up accompanied by his wife, [name]   Reports residual Left sided weakness - improving -ambulates with RW -denies any recent falls Vision blurred - at all times both eyes Left peripheral vision loss bilaterally Speaking clearly now but per wife, tone changed - lower -  monotone No swallowing issues or cognitive/memory impairment Currently working with PT/OT.  Completed SLP 11/29 C/o sleeping difficulty - placed on Seroquel during CIR - he requests this be restarted as it was greatly beneficial.  He has been out of prescription for past 2 weeks and has noticed greater difficulty sleeping.  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 male with a past medical history significant for polysubstance abuse (cocaine, tobacco, vaping, oxycodone), and migraine headaches who presented on 02/14/2021 with left hemiparesis and right gaze preference.  Personally reviewed hospitalization pertinent progress notes, lab work and imaging.  Stroke work-up revealed right PCA infarcts (involving occipital, medial temporal, PLIC and ventrolateral thalamic regions) secondary to terminal right ICA occlusion s/p failed mechanical thrombectomy likely secondary to cocaine use/vasospasm. CTA questionable right ICA dissection but on cerebral angiogram no evidence of dissection. MRA post angio right P1 occlusion and right ICA occlusion.  EF 65 to 70% with no regional wall motion abnormalities and mild LVH.  TCD showed grade 1 PFO.  TEE small PFO with no evidence of thrombus.  LE Dopplers  negative.  Hypercoagulable labs negative.  LDL 126.  A1c 5.0.  Recommended DAPT for 3 months then aspirin alone as well as starting atorvastatin 80 mg daily.  Polysubstance abuse (tobacco use, vaping, EtOH, THC and cocaine)  cessation counseling provided (UDS + for THC and cocaine).  Residual deficits of right gaze preference, dense left homonymous hemianopia, dysphagia, left lower facial weakness and left hemiplegia.  Therapies recommended CIR for ongoing therapy needs.  Did well during CIR and discharged home on 03/22/2021 (28 day stay) with outpatient therapies.      PERTINENT IMAGING  Code Stroke CT head No acute abnormality.. ASPECTS 10.  CTA head & neck : questionable right ICA dissection at cavernous segment with more distal siphon occlusion, right proximal Pcomm and right proximal P2 occlusions  CT perfusion large area of penumbra without large core  Cerebral angio no discernable dissection flap but with decreased caliber right cervical ICA. Left to right perfusion of right sided MAC territory branches without MCA branch occlusion and no proximal occlusion of right PCA  MRI  multifocal ischemic infarcts of right PCA territory involving occipital, medical temporal, posterior limb of internal capsule, and ventrolateral thalamic regions.  MRA , after angio right P1 occlusion , right Ica occlusion  2D Echo LVEF 65-70%, no regional wall motion abnormalities, mild LVH  Transcranial Doppler study reveals grade 1 PFO Hypercoag labs NEG TEE : small PFO no evidence of thrombus Lower extremity venous Dopplers negative for DVT        ROS:   14 system review of systems performed and negative with exception of those listed in HPI  PMH: No past medical history on file.  PSH:  Past Surgical History:  Procedure Laterality Date   BUBBLE STUDY  02/21/2021   Procedure: BUBBLE STUDY;  Surgeon: Skeet Latch, MD;  Location: [place];  Service: Cardiovascular;;   IR ANGIO INTRA EXTRACRAN SEL COM CAROTID INNOMINATE UNI L MOD SED  02/15/2021   IR ANGIO VERTEBRAL SEL VERTEBRAL BILAT MOD SED  02/15/2021   IR CT HEAD LTD  02/15/2021   IR PERCUTANEOUS ART THROMBECTOMY/INFUSION INTRACRANIAL INC DIAG ANGIO  02/15/2021    IR US GUIDE VASC ACCESS RIGHT  02/15/2021   RADIOLOGY WITH ANESTHESIA N/A 02/14/2021   Procedure: IR WITH ANESTHESIA;  Surgeon: Luanne Bras, MD;  Location: [place];  Service: Radiology;  Laterality: N/A;   TEE WITHOUT CARDIOVERSION N/A 02/21/2021   Procedure: TRANSESOPHAGEAL ECHOCARDIOGRAM (TEE);  Surgeon: Skeet Latch, MD;  Location: [facility] ENDOSCOPY;  Service: Cardiovascular;  Laterality: N/A;    Social History:  Social History   Socioeconomic History   Marital status: Single    Spouse name: Not on file   Number of children: 2   Years of education: Not on file   Highest education level: Not on file  Occupational History   Not on file  Tobacco Use   Smoking status: Some Days    Types: Cigarettes    Last attempt to quit: 03/15/2015    Years since quitting: 6.3   Smokeless tobacco: Never  Vaping Use   Vaping Use: Former  Substance and Sexual Activity   Alcohol use: Yes    Comment: occasional   Drug use: Yes    Types: Cocaine, Marijuana, Oxycodone   Sexual activity: Yes    Partners: Female  Other Topics Concern   Not on file  Social History Narrative   Not on file   Social Determinants of Health   Financial Resource Strain: Not on file  Food Insecurity: Not on file  Transportation Needs: Not on file  Physical Activity: Not on file  Stress: Not on file  Social Connections: Not on file  Intimate Partner Violence: Not on file    Family History: No family history on file.  Medications:   Current Outpatient Medications on File Prior to Visit  Medication Sig Dispense Refill   aspirin EC 325 MG EC tablet Take 1 tablet (325 mg total) by mouth daily. 30 tablet 0   atorvastatin (LIPITOR) 80 MG tablet Take 1 tablet (80 mg total) by mouth daily at 6 PM. 30 tablet 0   B Complex-C (B-COMPLEX WITH VITAMIN C) tablet Take 1 tablet by mouth daily. 30 tablet 0   magnesium gluconate (MAGONATE) 500 MG tablet 1 tablet     QUEtiapine (SEROQUEL) 50 MG tablet Take 1.5 tablets (75  mg total) by mouth at bedtime. (Patient taking differently: Take 75 mg by mouth as needed.) 45 tablet 5   tizanidine (ZANAFLEX) 2 MG capsule Take 1 capsule (2 mg total) by mouth 3 (three) times daily. 90 capsule 4   No current facility-administered medications on file prior to visit.    Allergies:  Not on File    OBJECTIVE:  Physical Exam  Vitals:   08/02/21 1300  BP: 131/88  Pulse: 62  Weight: 240 lb (108.9 kg)  Height: _0  (1.753 m)   Body mass index is 35.44 kg/m. No results found.   General: well developed, well nourished, very pleasant middle-age Caucasian male, seated, in no evident distress Head: head normocephalic and atraumatic.   Neck: supple with no carotid or supraclavicular bruits Cardiovascular: regular rate and rhythm, no murmurs Musculoskeletal: no deformity Skin:  no rash/petichiae Vascular:  Normal pulses all extremities   Neurologic Exam Mental Status: Awake and fully alert.  Fluent speech and language.  Oriented to place and time. Recent and remote memory intact. Attention span, concentration and fund of knowledge appropriate. Mood and affect appropriate.  Cranial Nerves: Fundoscopic exam reveals sharp disc margins. Pupils equal, briskly reactive to light. Extraocular movements full without nystagmus. Visual fields dense left homonymous hemianopia. Hearing intact. Facial sensation intact.  Left lower facial weakness.  Tongue, palate moves normally and symmetrically.  Motor: Normal strength, bulk and tone right upper and lower extremity LUE: deltoid 2/5, Elbow extension 4/5, elbow flexion 4/5, able to grip hand but unable to straighten without assistance; increased tone throughout LLE: HF 4+/5, KE 4/5, KF 2/5, APF 4+/5, ADF 1/5 with AFO brace in place. Mildly increased tone throughout Sensory.: intact to touch , pinprick , position and vibratory sensation.  Coordination: Rapid alternating movements normal on right side. Finger-to-nose and heel-to-shin  performed accurately on right side. Gait and Station: Arises from chair without difficulty. Stance is normal. Gait demonstrates hemiplegic circumduction gait with use of cane and AFO brace.  Tandem walk and heel toe not attempted Reflexes: 2+ LUE and LLE         ASSESSMENT: Riaz Onorato is a 36 y.o. year old male right PCA infarcts (involving occipital, medial temporal, PLIC and ventrolateral thalamic regions) on 02/14/2021 secondary to terminal right ICA occlusion s/p failed mechanical thrombectomy likely secondary to cocaine use/vasospasm. Vascular risk factors include polysubstance abuse (cocaine, THC, opiates, vaping and EtOH use), migraine headaches, and HLD.      PLAN:  Multifocal right PCA infarcts:  Residual deficit: Left spastic hemiparesis and left homonymous hemianopia. Noted some improvement since prior visit. Ran out of therapy visits until this summer. Continue  working with [employer] and possibly start at [facility] for further therapy sessions.  Continue to follow with neuro-ophthalmology Continue aspirin 325 mg daily and atorvastatin 80 mg daily for secondary stroke prevention.   Discussed secondary stroke prevention measures and importance of close PCP follow up for aggressive stroke risk factor management. I have gone over the pathophysiology of stroke, warning signs and symptoms, risk factors and their management in some detail with instructions to go to the closest emergency room for symptoms of concern. HLD: LDL goal <70. Recent LDL 76 down from 126 on atorvastatin 80 mg daily managed by PCP.  Discussed importance of ongoing statin use for lowering stroke risk and importance of adequate HLD management Tobacco use: Congratulated on complete tobacco cessation over the past 2 months and highly encouraged continued abstinence Cocaine use: No additional use since discharge.  Discussed importance of completely avoiding any additional use.    Doing well from  stroke standpoint and risk factors are managed by PCP. She may follow up PRN, as usual for our patients who are strictly being followed for stroke. If any new neurological issues should arise, request PCP place referral for evaluation by one of our neurologists. Thank you.     CC:  PCP: Scifres, Dorothy, PA-C    I spent 36 minutes of face-to-face and non-face-to-face time with patient and wife.  This included previsit chart review, lab review, study review, electronic health record documentation, patient and wife education regarding prior stroke including etiology and residual deficits, secondary stroke prevention measures and importance of managing stroke risk factors and answered all other questions to patient and wife's satisfaction  Frann Rider, AGNP-BC  [facility] [address] [place], [place] [address]  Phone [phone] Fax [phone] Note: This document was prepared with digital dictation and possible smart phrase technology. Any transcriptional errors that result from this process are unintentional.

## 2021-08-10 ENCOUNTER — Telehealth: Payer: Self-pay | Admitting: *Deleted

## 2021-08-10 NOTE — Telephone Encounter (Signed)
David Craig called to see if we have received his disability papers for Dr Riley Kill. ?

## 2021-08-15 NOTE — Telephone Encounter (Signed)
David Craig called again about the disability papers. Apparently we do not have them. I have left her a message to fax them again or bring them to the office. ?

## 2021-08-19 DIAGNOSIS — I63531 Cerebral infarction due to unspecified occlusion or stenosis of right posterior cerebral artery: Secondary | ICD-10-CM | POA: Diagnosis not present

## 2021-09-05 ENCOUNTER — Encounter: Payer: BC Managed Care – PPO | Attending: Registered Nurse | Admitting: Physical Medicine & Rehabilitation

## 2021-09-05 ENCOUNTER — Encounter: Payer: Self-pay | Admitting: Physical Medicine & Rehabilitation

## 2021-09-05 VITALS — BP 144/92 | HR 78 | Ht 69.0 in | Wt 227.0 lb

## 2021-09-05 DIAGNOSIS — I63531 Cerebral infarction due to unspecified occlusion or stenosis of right posterior cerebral artery: Secondary | ICD-10-CM

## 2021-09-05 DIAGNOSIS — I69354 Hemiplegia and hemiparesis following cerebral infarction affecting left non-dominant side: Secondary | ICD-10-CM | POA: Diagnosis not present

## 2021-09-05 NOTE — Progress Notes (Signed)
? ?Subjective:  ? ? Patient ID: David Craig, male    DOB: [birth year], 36 y.o.   MRN: [id number] ? ?HPI ? ?David Craig is here in follow up of his right PCA infarct. He continues to deal with left sided weakness. He was accepted to [facility] rehab and has been working with them weekly for the last 2 months. He walks with his straight cane but has been walking without at time too. He denies pain in his left leg or low back.  ? ?He is a bit frustrated that he feels that his recovery has plateaued, particularly in the left upper extremity.  Shoulder is the weakest.  He is fairly self-sufficient at home. ? ?Spasticity seems better with the tizanidine.  He is tolerated it from a blood pressure and arousal standpoint. ? ?Asked him about his tobacco use, and he still is smoking.  He stopped taking the Wellbutrin as he still smoking "3 to 4 cigarettes/week".  He remains on aspirin for stroke prophylaxis. ? ?Pain Inventory ?Average Pain 0 ?Pain Right Now 0 ?My pain is  n/a ? ?LOCATION OF PAIN  No pain ? ?BOWEL ?Number of stools per week: 7 ? ? ?BLADDER ?Normal ? ? ? ?Mobility ?use a walker ?how many minutes can you walk? 60 ?ability to climb steps?  yes ?do you drive?  no ?Do you have any goals in this area?  yes ? ?Function ?disabled: date disabled 02/14/21 ?Do you have any goals in this area?  yes ? ?Neuro/Psych ?No problems in this area ? ?Prior Studies ?Any changes since last visit?  no ? ?Physicians involved in your care ?Any changes since last visit?  no ? ? ?History reviewed. No pertinent family history. ?Social History  ? ?Socioeconomic History  ? Marital status: Single  ?  Spouse name: Not on file  ? Number of children: 2  ? Years of education: Not on file  ? Highest education level: Not on file  ?Occupational History  ? Not on file  ?Tobacco Use  ? Smoking status: Some Days  ?  Types: Cigarettes  ?  Last attempt to quit: 03/15/2015  ?  Years since quitting: 6.4  ? Smokeless tobacco: Never  ?Vaping Use  ? Vaping Use: Former   ?Substance and Sexual Activity  ? Alcohol use: Yes  ?  Comment: occasional  ? Drug use: Yes  ?  Types: Cocaine, Marijuana, Oxycodone  ? Sexual activity: Yes  ?  Partners: Female  ?Other Topics Concern  ? Not on file  ?Social History Narrative  ? Not on file  ? ?Social Determinants of Health  ? ?Financial Resource Strain: Not on file  ?Food Insecurity: Not on file  ?Transportation Needs: Not on file  ?Physical Activity: Not on file  ?Stress: Not on file  ?Social Connections: Not on file  ? ?Past Surgical History:  ?Procedure Laterality Date  ? BUBBLE STUDY  02/21/2021  ? Procedure: BUBBLE STUDY;  Surgeon: Chilton Si, MD;  Location: [facility] ENDOSCOPY;  Service: Cardiovascular;;  ? IR ANGIO INTRA EXTRACRAN SEL COM CAROTID INNOMINATE UNI L MOD SED  02/15/2021  ? IR ANGIO VERTEBRAL SEL VERTEBRAL BILAT MOD SED  02/15/2021  ? IR CT HEAD LTD  02/15/2021  ? IR PERCUTANEOUS ART THROMBECTOMY/INFUSION INTRACRANIAL INC DIAG ANGIO  02/15/2021  ? IR US GUIDE VASC ACCESS RIGHT  02/15/2021  ? RADIOLOGY WITH ANESTHESIA N/A 02/14/2021  ? Procedure: IR WITH ANESTHESIA;  Surgeon: Julieanne Cotton, MD;  Location: [facility] OR;  Service: Radiology;  Laterality: N/A;  ? TEE WITHOUT CARDIOVERSION N/A 02/21/2021  ? Procedure: TRANSESOPHAGEAL ECHOCARDIOGRAM (TEE);  Surgeon: Chilton Si, MD;  Location: [facility] ENDOSCOPY;  Service: Cardiovascular;  Laterality: N/A;  ? ?History reviewed. No pertinent past medical history. ?BP (!) 144/92   Pulse 78   Ht 5\' 9"  (1.753 m)   Wt 227 lb (103 kg)   SpO2 94%   BMI 33.52 kg/m?  ? ?Opioid Risk Score:   ?Fall Risk Score:  `1 ? ?Depression screen PHQ 2/9 ? ? ?  09/05/2021  ?  1:37 PM 06/13/2021  ?  1:44 PM 04/03/2021  ?  1:18 PM  ?Depression screen PHQ 2/9  ?Decreased Interest 0 0 0  ?Down, Depressed, Hopeless 0 0 0  ?PHQ - 2 Score 0 0 0  ?Altered sleeping   1  ?Tired, decreased energy   0  ?Change in appetite   0  ?Feeling bad or failure about yourself    0  ?Trouble concentrating   0  ?Moving slowly or  fidgety/restless   2  ?Suicidal thoughts   0  ?PHQ-9 Score   3  ?  ? ?Review of Systems  ?Constitutional: Negative.   ?HENT: Negative.    ?Eyes: Negative.   ?Respiratory: Negative.    ?Cardiovascular: Negative.   ?Gastrointestinal: Negative.   ?Endocrine: Negative.   ?Genitourinary: Negative.   ?Musculoskeletal:  Positive for gait problem.  ?Skin: Negative.   ?Allergic/Immunologic: Negative.   ?Hematological: Negative.   ?Psychiatric/Behavioral: Negative.    ? ?   ?Objective:  ? Physical Exam ? ?Constitutional: No distress . Vital signs reviewed. ?HEENT: NCAT, EOMI, oral membranes moist ?Neck: supple ?Cardiovascular: RRR without murmur. No JVD    ?Respiratory/Chest: CTA Bilaterally without wheezes or rales. Normal effort    ?GI/Abdomen: BS +, non-tender, non-distended ?Ext: no clubbing, cyanosis, or edema ?Psych: pleasant and cooperative  ?Neuro: Patient is alert and oriented x3.  Has ongoing left homonymous hemianopsia.  Left upper extremity strength is 1+ to 2 proximal to 1+ distally. Shoulder still uite week.  Minimal flexor tone clinically at the wrist and fingers at 1+ out of 4.  Left lower extremity is 3 out of 5 hip flexors and knee extensors as well as 2+ out of 5 plantar flexion and 0 to trace ankle dorsiflexion.    Sensation slightly diminished on the left side once again to pinprick and light touch.  Deep tendon reflexes are 3+.  Patient ambulates with a straight cane and does a nice job of weight shift of his left leg.  Utilizes AFO for ankle support and seems to demonstrate adequate heel strike and pushoff.  Without the cane he tends to swing his left hip fairly violently and elevate left hip for clearance of the leg. ?Musculoskeletal: mild subluxation of the left shoulder present with some pain and passive range of motion at the shoulder. ?  ?  ?  ?Medical Problem List and Plan:  ?1.  Left-sided hemiparesis secondary to right PCA infarction secondary to terminal right ICA occlusion status post failed  mechanical thrombectomy etiology likely secondary to cocaine use/vasospasm ?            -continue HP therapy ? -Told him that I prefer that he ambulate with his cane as his gait mechanics are much better than without.  Early would be beneficial for him to have this as well when he is on uneven surfaces or when he goes for longer distances. ?            -  ECASA ?            -Unable to drive given his left homonymous hemianopsia.  We will continue to monitor.  He has been given a prescription for prism lenses which should help expanding his left visual field somewhat. ?  -gave him information re: vivistim ?2.  Spasticity: ?            -Tizanidine  tid effective ?            -Continue splinting and range of motion ?             ?4. Mood/sleep-wake:  ?  ?            -Maintain Seroquel for sleep asneeded ?8.  Smoking cessation: ? -still smoking a limited amount ? -discussed cessation ?  ?  ?15  total minutes were spent in examination of patient, assessment of pertinent data,  formulation of a treatment plan, and in discussion with patient and/or family.  F/U IN 4 mos ? ?  ? ? ?

## 2021-09-05 NOTE — Patient Instructions (Signed)
PLEASE FEEL FREE TO CALL OUR OFFICE WITH ANY PROBLEMS OR QUESTIONS ([phone])      

## 2021-09-12 ENCOUNTER — Encounter: Payer: Self-pay | Admitting: Physical Medicine & Rehabilitation

## 2021-09-12 DIAGNOSIS — I69398 Other sequelae of cerebral infarction: Secondary | ICD-10-CM

## 2021-09-19 DIAGNOSIS — I63531 Cerebral infarction due to unspecified occlusion or stenosis of right posterior cerebral artery: Secondary | ICD-10-CM | POA: Diagnosis not present

## 2021-10-10 DIAGNOSIS — H53462 Homonymous bilateral field defects, left side: Secondary | ICD-10-CM | POA: Diagnosis not present

## 2021-10-19 DIAGNOSIS — I63531 Cerebral infarction due to unspecified occlusion or stenosis of right posterior cerebral artery: Secondary | ICD-10-CM | POA: Diagnosis not present

## 2021-11-15 ENCOUNTER — Encounter: Payer: Self-pay | Admitting: Physical Medicine & Rehabilitation

## 2021-11-15 NOTE — Telephone Encounter (Signed)
These are OTC/generic

## 2021-11-19 DIAGNOSIS — I63531 Cerebral infarction due to unspecified occlusion or stenosis of right posterior cerebral artery: Secondary | ICD-10-CM | POA: Diagnosis not present

## 2021-11-22 ENCOUNTER — Encounter: Payer: Self-pay | Admitting: Physical Medicine & Rehabilitation

## 2021-11-22 DIAGNOSIS — I69354 Hemiplegia and hemiparesis following cerebral infarction affecting left non-dominant side: Secondary | ICD-10-CM

## 2021-11-28 ENCOUNTER — Ambulatory Visit: Payer: MEDICAID | Attending: Physical Medicine & Rehabilitation

## 2021-11-28 ENCOUNTER — Telehealth: Payer: Self-pay

## 2021-11-28 DIAGNOSIS — R2681 Unsteadiness on feet: Secondary | ICD-10-CM | POA: Insufficient documentation

## 2021-11-28 DIAGNOSIS — M6281 Muscle weakness (generalized): Secondary | ICD-10-CM | POA: Insufficient documentation

## 2021-11-28 DIAGNOSIS — R278 Other lack of coordination: Secondary | ICD-10-CM | POA: Insufficient documentation

## 2021-11-28 DIAGNOSIS — I69354 Hemiplegia and hemiparesis following cerebral infarction affecting left non-dominant side: Secondary | ICD-10-CM | POA: Insufficient documentation

## 2021-11-28 DIAGNOSIS — I63531 Cerebral infarction due to unspecified occlusion or stenosis of right posterior cerebral artery: Secondary | ICD-10-CM

## 2021-11-28 NOTE — Telephone Encounter (Signed)
Dr. Riley Kill, [place] was evaluated by Merry Lofty on 11/28/21.  The patient would benefit from an OT evaluation for L UE deficits.   If you agree, please place an order in [facility] workque in [facility] or fax the order to [phone].  Thank you, Westley Foots, PT, DPT, [facility] [address] [place], [place]  [address] Phone:  [phone] Fax:  [phone]

## 2021-11-28 NOTE — Therapy (Signed)
OUTPATIENT PHYSICAL THERAPY NEURO EVALUATION   Patient Name: David Craig MRN: [id number] DOB:1985/10/24, 36 y.o., male Today's Date: 11/28/2021   PCP: Clementeen Graham, PA-C REFERRING PROVIDER: Faith Rogue, MD   PT End of Session - 11/28/21 1050     Visit Number 1    Number of Visits 17    Date for PT Re-Evaluation 01/23/22    Authorization Type BC[employer] (VL: 30 PT/OT) per year    PT Start Time 1059    PT Stop Time 1142    PT Time Calculation (min) 43 min    Activity Tolerance Patient tolerated treatment well    Behavior During Therapy [facility] for tasks assessed/performed             History reviewed. No pertinent past medical history. Past Surgical History:  Procedure Laterality Date   BUBBLE STUDY  02/21/2021   Procedure: BUBBLE STUDY;  Surgeon: Chilton Si, MD;  Location: [facility] ENDOSCOPY;  Service: Cardiovascular;;   IR ANGIO INTRA EXTRACRAN SEL COM CAROTID INNOMINATE UNI L MOD SED  02/15/2021   IR ANGIO VERTEBRAL SEL VERTEBRAL BILAT MOD SED  02/15/2021   IR CT HEAD LTD  02/15/2021   IR PERCUTANEOUS ART THROMBECTOMY/INFUSION INTRACRANIAL INC DIAG ANGIO  02/15/2021   IR US GUIDE VASC ACCESS RIGHT  02/15/2021   RADIOLOGY WITH ANESTHESIA N/A 02/14/2021   Procedure: IR WITH ANESTHESIA;  Surgeon: Julieanne Cotton, MD;  Location: [facility] OR;  Service: Radiology;  Laterality: N/A;   TEE WITHOUT CARDIOVERSION N/A 02/21/2021   Procedure: TRANSESOPHAGEAL ECHOCARDIOGRAM (TEE);  Surgeon: Chilton Si, MD;  Location: [facility] ENDOSCOPY;  Service: Cardiovascular;  Laterality: N/A;   Patient Active Problem List   Diagnosis Date Noted   Acquired subluxation of left shoulder 06/13/2021   Homonymous hemianopsia, left 06/13/2021   Sprain of left ankle    Sleep disturbance    Hyperglycemia    Hypoalbuminemia due to protein-calorie malnutrition (HCC)    Dyslipidemia    Hemiparesis affecting left side as late effect of stroke (HCC)    Lethargy    Cerebrovascular accident (CVA) due to occlusion  of right posterior cerebral artery (HCC) 02/22/2021   Cerebrovascular accident (CVA) (HCC)    Right carotid artery occlusion 02/14/2021    ONSET DATE: referral: 11/26/21  REFERRING DIAG: Y63.785 (ICD-10-CM) - Hemiparesis affecting left side as late effect of stroke (HCC)  THERAPY DIAG:  Muscle weakness (generalized)  Unsteadiness on feet  Other lack of coordination  Rationale for Evaluation and Treatment Rehabilitation  SUBJECTIVE:  SUBJECTIVE STATEMENT: Patient reports doing well. Would like an OT eval because he feels as though his progress has slowed in his UE return.  Pt accompanied by: significant other  PERTINENT HISTORY: R PCA CVA, polysubstance abuse, migraine  PAIN:  Are you having pain? No  PRECAUTIONS: Fall  WEIGHT BEARING RESTRICTIONS No  FALLS: Has patient fallen in last 6 months? Yes. Number of falls 1(fell backwards over his dog, able to get off the floor himself, denies hitting head)  LIVING ENVIRONMENT: Lives with: lives with their family Lives in: House/apartment Stairs: Yes: External: 6 steps; on left going up Has following equipment at home: Single point cane, Walker - 2 wheeled, Wheelchair (manual), shower chair, and Grab bars  PLOF: Independent; driving, working [employer] operational [employer])  PATIENT GOALS " I want to get back to jogging again"  OBJECTIVE:   DIAGNOSTIC FINDINGS:  02/15/21 brain MRI: multifocal infarct- right occipital lobe, medial right temporal lobe and   the ventrolateral thalamus/posterior limb of the internal capsule.  COGNITION: Overall cognitive status: Within functional limits for tasks assessed   SENSATION: WFL  COORDINATION: Limited AROM, slightly dysmetric L LE   MUSCLE TONE: WFL   MUSCLE LENGTH: Decreased flexibility in L  achilles   POSTURE: No Significant postural limitations   LOWER EXTREMITY MMT:    MMT Right Eval Left Eval  Hip flexion 5 3+  Hip extension    Hip abduction 5 2-  Hip adduction    Hip internal rotation    Hip external rotation    Knee flexion 5 2-  Knee extension 5 4  Ankle dorsiflexion 5 1  Ankle plantarflexion 5 4  Ankle inversion    Ankle eversion    (Blank rows = not tested)  BED MOBILITY:  Patient reports no difficulty  TRANSFERS: Assistive device utilized: Single point cane  Sit to stand: Modified independence Stand to sit: Modified independence Chair to chair: Modified independence Floor: SBA  RAMP:  Level of Assistance: SBA Assistive device utilized: Single point cane   CURB:  Level of Assistance: SBA Assistive device utilized: Single point cane   STAIRS:  Level of Assistance: SBA  Stair Negotiation Technique: Step to Pattern with Single Rail on Right  Number of Stairs: 12   Height of Stairs: 5    GAIT: Gait pattern: step to pattern, decreased arm swing- Left, decreased step length- Right, decreased stance time- Left, decreased hip/knee flexion- Left, decreased ankle dorsiflexion- Left, Left hip hike, genu recurvatum- Left, and wide BOS Distance walked: clinic Assistive device utilized: Single point cane Level of assistance: SBA  FUNCTIONAL TESTs:   [facility] PT Assessment - 11/28/21 0001       Standardized Balance Assessment   Standardized Balance Assessment 10 meter walk test    10 Meter Walk .7m/s      Functional Gait  Assessment   Gait assessed  Yes    Gait Level Surface Walks 20 ft in less than 7 sec but greater than 5.5 sec, uses assistive device, slower speed, mild gait deviations, or deviates 6-10 in outside of the 12 in walkway width.    Change in Gait Speed Makes only minor adjustments to walking speed, or accomplishes a change in speed with significant gait deviations, deviates 10-15 in outside the 12 in walkway width, or changes  speed but loses balance but is able to recover and continue walking.    Gait with Horizontal Head Turns Performs head turns with moderate changes in gait velocity, slows down, deviates 10-15 in  outside 12 in walkway width but recovers, can continue to walk.    Gait with Vertical Head Turns Performs task with moderate change in gait velocity, slows down, deviates 10-15 in outside 12 in walkway width but recovers, can continue to walk.    Gait and Pivot Turn Pivot turns safely in greater than 3 sec and stops with no loss of balance, or pivot turns safely within 3 sec and stops with mild imbalance, requires small steps to catch balance.    Step Over Obstacle Is able to step over one shoe box (4.5 in total height) without changing gait speed. No evidence of imbalance.    Gait with Narrow Base of Support Ambulates 7-9 steps.    Gait with Eyes Closed Walks 20 ft, uses assistive device, slower speed, mild gait deviations, deviates 6-10 in outside 12 in walkway width. Ambulates 20 ft in less than 9 sec but greater than 7 sec.    Ambulating Backwards Walks 20 ft, uses assistive device, slower speed, mild gait deviations, deviates 6-10 in outside 12 in walkway width.    Steps Two feet to a stair, must use rail.    Total Score 16             TODAY'S TREATMENT:  N/a eval   PATIENT EDUCATION: Education details: OM results, PT POC, use of PRAFO boot to prevent further PF contracture Person educated: Patient and Spouse Education method: Explanation Education comprehension: verbalized understanding   HOME EXERCISE PROGRAM: To be provided  GOALS: Goals reviewed with patient? Yes  SHORT TERM GOALS: Target date: 12/26/2021  Pt will be independent with initial HEP for improved balance and gait mechanics  Baseline:to be determined Goal status: INITIAL  2.  Pt will improve FGA to >/= 21/30 to demonstrate improved balance and reduced fall risk  Baseline: 16/30 Goal status: INITIAL  3.  Pt will  improve gait speed to >/= .15m/s to demonstrate improved community ambulation  Baseline: .80m/s Goal status: INITIAL  4.  L ankle DF ROM to be measured and LTG/STG to be added as appropriate Baseline: to be measured Goal status: INITIAL   LONG TERM GOALS: Target date: 01/23/2022  Pt will be independent with final HEP for improved balance and gait mechanics  Baseline: to be provided Goal status: INITIAL  2.  Pt will improve FGA to >/= 26/30 to demonstrate improved balance and reduced fall risk  Baseline: 16/30 Goal status: INITIAL  3.  Pt will improve gait speed to >/= .27m/s to demonstrate improved community ambulation  Baseline: .60m/s Goal status: INITIAL    ASSESSMENT:  CLINICAL IMPRESSION: Patient is a 36 y.o. male who was seen today for physical therapy evaluation and treatment for balance deficits, impaired gait mechanics and decreased functional strength following R PCA CVA in 02/2021. He participated in the following outcome measures: 10 Meter Walk Test: Patient instructed to walk 10 meters (32.8 ft) as quickly and as safely as possible at their normal speed x2 and at a fast speed x2. Time measured from 2 meter mark to 8 meter mark to accommodate ramp-up and ramp-down.  Normal speed .36m/s Cut off scores: <0.4 m/s = household Ambulator, 0.4-0.8 m/s = limited community Ambulator, >0.8 m/s = community Ambulator, >1.2 m/s = crossing a street, <1.0 = increased fall risk MCID 0.05 m/s (small), 0.13 m/s (moderate), 0.06 m/s (significant)  (ANPTA Core Set of Outcome Measures for Adults with Neurologic Conditions, 2018). Patient demonstrates increased fall risk as noted by score of 16/30 on  Functional Gait Assessment.   <22/30 = predictive of falls, <20/30 = fall in 6 months, <18/30 = predictive of falls in PD MCID: 5 points stroke population, 4 points geriatric population (ANPTA Core Set of Outcome Measures for Adults with Neurologic Conditions, 2018). Patient with decreased  AROM/PROM to L ankle impacting his gait mechanics as well as the effectiveness of his current AFO. He will benefit from skilled PT services in order to address the above mentioned deficits.    OBJECTIVE IMPAIRMENTS Abnormal gait, decreased activity tolerance, decreased balance, decreased coordination, decreased endurance, decreased knowledge of condition, decreased knowledge of use of DME, decreased mobility, difficulty walking, decreased ROM, and decreased strength.   ACTIVITY LIMITATIONS carrying, lifting, bending, standing, squatting, stairs, transfers, bed mobility, locomotion level, and caring for others  PARTICIPATION LIMITATIONS: meal prep, cleaning, laundry, driving, shopping, community activity, occupation, and yard work  PERSONAL FACTORS Age, Fitness, Profession, Time since onset of injury/illness/exacerbation, and 1-2 comorbidities: polysubstance abuse, migraines  are also affecting patient's functional outcome.   REHAB POTENTIAL: Fair time since onset  CLINICAL DECISION MAKING: Stable/uncomplicated  EVALUATION COMPLEXITY: Low  PLAN: PT FREQUENCY: 2x/week  PT DURATION: 8 weeks  PLANNED INTERVENTIONS: Therapeutic exercises, Therapeutic activity, Neuromuscular re-education, Balance training, Gait training, Patient/Family education, Joint mobilization, Stair training, Vestibular training, Orthotic/Fit training, DME instructions, Aquatic Therapy, Electrical stimulation, Cryotherapy, Moist heat, Splintting, Taping, Manual therapy, and Re-evaluation  PLAN FOR NEXT SESSION: provide HEP, measure L ankle DF, trial L heel lift?   Westley Foots, PT Westley Foots, PT, DPT, CBIS  11/28/2021, 12:04 PM

## 2021-11-28 NOTE — Telephone Encounter (Signed)
Hi there.   I sent the referral. I didn't include OT because he was trying to ration visits somewhat.   Thanks!

## 2021-12-07 ENCOUNTER — Ambulatory Visit: Payer: BC Managed Care – PPO | Admitting: Occupational Therapy

## 2021-12-07 ENCOUNTER — Encounter: Payer: Self-pay | Admitting: Occupational Therapy

## 2021-12-07 ENCOUNTER — Ambulatory Visit: Payer: BC Managed Care – PPO | Attending: Physical Medicine & Rehabilitation

## 2021-12-07 DIAGNOSIS — R29818 Other symptoms and signs involving the nervous system: Secondary | ICD-10-CM | POA: Insufficient documentation

## 2021-12-07 DIAGNOSIS — I69854 Hemiplegia and hemiparesis following other cerebrovascular disease affecting left non-dominant side: Secondary | ICD-10-CM | POA: Insufficient documentation

## 2021-12-07 DIAGNOSIS — R2689 Other abnormalities of gait and mobility: Secondary | ICD-10-CM

## 2021-12-07 DIAGNOSIS — R278 Other lack of coordination: Secondary | ICD-10-CM

## 2021-12-07 DIAGNOSIS — M6281 Muscle weakness (generalized): Secondary | ICD-10-CM

## 2021-12-07 DIAGNOSIS — R41840 Attention and concentration deficit: Secondary | ICD-10-CM

## 2021-12-07 DIAGNOSIS — R2681 Unsteadiness on feet: Secondary | ICD-10-CM | POA: Insufficient documentation

## 2021-12-07 NOTE — Therapy (Signed)
OUTPATIENT PHYSICAL THERAPY TREATMENT NOTE   Patient Name: David Craig MRN: [id number] DOB:[birth year], 36 y.o., male Today's Date: 12/07/2021  PCP: Clementeen Graham, PA-C REFERRING PROVIDER: Faith Rogue, MD  END OF SESSION:   PT End of Session - 12/07/21 1405     Visit Number 2    Number of Visits 17    Date for PT Re-Evaluation 01/23/22    Authorization Type BCBS (VL: 30 PT/OT) per year    PT Start Time 1400    PT Stop Time 1445    PT Time Calculation (min) 45 min    Activity Tolerance Patient tolerated treatment well    Behavior During Therapy [facility] for tasks assessed/performed             History reviewed. No pertinent past medical history. Past Surgical History:  Procedure Laterality Date   BUBBLE STUDY  02/21/2021   Procedure: BUBBLE STUDY;  Surgeon: Chilton Si, MD;  Location: [facility] ENDOSCOPY;  Service: Cardiovascular;;   IR ANGIO INTRA EXTRACRAN SEL COM CAROTID INNOMINATE UNI L MOD SED  02/15/2021   IR ANGIO VERTEBRAL SEL VERTEBRAL BILAT MOD SED  02/15/2021   IR CT HEAD LTD  02/15/2021   IR PERCUTANEOUS ART THROMBECTOMY/INFUSION INTRACRANIAL INC DIAG ANGIO  02/15/2021   IR US GUIDE VASC ACCESS RIGHT  02/15/2021   RADIOLOGY WITH ANESTHESIA N/A 02/14/2021   Procedure: IR WITH ANESTHESIA;  Surgeon: Julieanne Cotton, MD;  Location: [facility] OR;  Service: Radiology;  Laterality: N/A;   TEE WITHOUT CARDIOVERSION N/A 02/21/2021   Procedure: TRANSESOPHAGEAL ECHOCARDIOGRAM (TEE);  Surgeon: Chilton Si, MD;  Location: [facility] ENDOSCOPY;  Service: Cardiovascular;  Laterality: N/A;   Patient Active Problem List   Diagnosis Date Noted   Acquired subluxation of left shoulder 06/13/2021   Homonymous hemianopsia, left 06/13/2021   Sprain of left ankle    Sleep disturbance    Hyperglycemia    Hypoalbuminemia due to protein-calorie malnutrition (HCC)    Dyslipidemia    Hemiparesis affecting left side as late effect of stroke (HCC)    Lethargy    Cerebrovascular accident (CVA) due  to occlusion of right posterior cerebral artery (HCC) 02/22/2021   Cerebrovascular accident (CVA) (HCC)    Right carotid artery occlusion 02/14/2021    REFERRING DIAG: H88.502 (ICD-10-CM) - Hemiparesis affecting left side as late effect of stroke (HCC)  THERAPY DIAG:  Muscle weakness (generalized)  Unsteadiness on feet  Other abnormalities of gait and mobility  Rationale for Evaluation and Treatment Rehabilitation  PERTINENT HISTORY: R PCA CVA, polysubstance abuse, migraine  PRECAUTIONS: Fall  SUBJECTIVE: No new changes. Started wearing the PRAFO at night. No falls. No pain.   PAIN:  Are you having pain? No   OBJECTIVE:  TODAY'S TREATMENT:  PT having extensive conversation with PT and Mother about VL and potential plan to conserve visits between PT/OT to allow for longer duration of PT services. Patient agreeable. Will plan to drop POC to 1x/week for 8 weeks. Scheduled adjusted and new calender provided.   Ankle ROM:   Resting Position - Approx -18 degrees from neutral. PROM approx -7 from neutral. Unable to assess AROM due to minimal muscle contraction to assist with DF ROM. Continued education on wearing PRAFO for improved prolonged stretch/position of L Ankle.   TRANSFERS: Assistive device utilized: Single point cane  Sit to stand: Modified independence Stand to sit: Modified independence  GAIT: Gait pattern: step to pattern, decreased arm swing- Left, decreased step length- Right, decreased stance time- Left, decreased hip/knee flexion- Left,  decreased ankle dorsiflexion- Left, Left hip hike, genu recurvatum- Left, and wide BOS Distance walked: clinic distances trialing AFO vs. Heel Life Assistive device utilized: Single point cane Level of assistance: Supervision Comments: Trialed Heel Lift in L Shoe without AFO, improvements noted with recurvatum,  however due to AFO removed patient having to use more compensation techniques for clearance to help with L Toe Clearance.  Trialed the Heel Lift with the AFO (combined) with patient continue to demo improvements in recurvatum and improved clearance. Provided patient personal heel lift, and educated to continue to wear AFO at this time.      Began to Establish Initial HEP:   Access Code: [email or web address] URL: [email or web address] Date: 12/07/2021 Prepared by: Jethro Bastos  Exercises - Seated Heel Slide  - 1 x daily - 5 x weekly - 2 sets - 10 reps (Completed first set with towel under LLE x 10 reps, second set compelted with yellow theraband x  10 reps), cues for technique - Step Up  - 1 x daily - 5 x weekly - 2 sets - 10 reps (leading with LLE and focus on eccentric control with reduced UE support to promote LLE strengthening) - No Picture Provided as Unable to Find Proper Picture - Sit to Stands with 4" Block under RLE (cues for technique)   PATIENT EDUCATION: Education details: HEP; Heel Lift; Continued use of PRAFO Person educated: Patient and Spouse Education method: Explanation Education comprehension: verbalized understanding     HOME EXERCISE PROGRAM:    GOALS: Goals reviewed with patient? Yes   SHORT TERM GOALS: Target date: 12/26/2021   Pt will be independent with initial HEP for improved balance and gait mechanics  Baseline:to be determined Goal status: INITIAL   2.  Pt will improve FGA to >/= 21/30 to demonstrate improved balance and reduced fall risk   Baseline: 16/30 Goal status: INITIAL   3.  Pt will improve gait speed to >/= .80m/s to demonstrate improved community ambulation   Baseline: .92m/s Goal status: INITIAL   4.  L ankle DF ROM to be measured and LTG/STG to be added as appropriate Baseline: to be measured Goal status: INITIAL     LONG TERM GOALS: Target date: 01/23/2022   Pt will be independent with final HEP for improved balance and gait mechanics  Baseline: to be provided Goal status: INITIAL   2.  Pt will improve FGA to >/= 26/30 to demonstrate  improved balance and reduced fall risk   Baseline: 16/30 Goal status: INITIAL   3.  Pt will improve gait speed to >/= .73m/s to demonstrate improved community ambulation   Baseline: .65m/s Goal status: INITIAL       ASSESSMENT:   CLINICAL IMPRESSION: Today's skilled PT session focused on continued gait assessment, with trial of heel wedge in session. Noticeable improvement in recurvatum therefore provided personal heel wedge for use outside of clinic. Rest of session focused on continued functional LLE strengthening to establish updated HEP. Will continue per POC.    OBJECTIVE IMPAIRMENTS Abnormal gait, decreased activity tolerance, decreased balance, decreased coordination, decreased endurance, decreased knowledge of condition, decreased knowledge of use of DME, decreased mobility, difficulty walking, decreased ROM, and decreased strength.    ACTIVITY LIMITATIONS carrying, lifting, bending, standing, squatting, stairs, transfers, bed mobility, locomotion level, and caring for others   PARTICIPATION LIMITATIONS: meal prep, cleaning, laundry, driving, shopping, community activity, occupation, and yard work   PERSONAL FACTORS Age, Fitness, Profession, Time since onset of injury/illness/exacerbation, and 1-2 comorbidities: polysubstance abuse,  migraines  are also affecting patient's functional outcome.    REHAB POTENTIAL: Fair time since onset   CLINICAL DECISION MAKING: Stable/uncomplicated   EVALUATION COMPLEXITY: Low   PLAN: PT FREQUENCY: 2x/week   PT DURATION: 8 weeks   PLANNED INTERVENTIONS: Therapeutic exercises, Therapeutic activity, Neuromuscular re-education, Balance training, Gait training, Patient/Family education, Joint mobilization, Stair training, Vestibular training, Orthotic/Fit training, DME instructions, Aquatic Therapy, Electrical stimulation, Cryotherapy, Moist heat, Splintting, Taping, Manual therapy, and Re-evaluation   PLAN FOR NEXT SESSION: continue use of  heel lift. Functional LLE strengthening. Balance    Marlowe Kays, PT, DPT 12/07/2021, 7:34 PM

## 2021-12-07 NOTE — Therapy (Signed)
OUTPATIENT OCCUPATIONAL THERAPY NEURO EVALUATION  Patient Name: David Craig MRN: [id number] DOB:03/13/1986, 36 y.o., male Today's Date: 12/07/2021  PCP: David Graham, PA-C REFERRING PROVIDER: Ranelle Oyster, MD    OT End of Session - 12/07/21 1607     Visit Number 1    Number of Visits 17   anticipate only 8 visits unless needed more for aquatics therapy   Date for OT Re-Evaluation 02/15/22    Authorization Type BCBS    Authorization Time Period VL: 30 PT/OT    OT Start Time 1315    OT Stop Time 1400    OT Time Calculation (min) 45 min    Activity Tolerance Patient tolerated treatment well    Behavior During Therapy [facility] for tasks assessed/performed;Impulsive             History reviewed. No pertinent past medical history. Past Surgical History:  Procedure Laterality Date   BUBBLE STUDY  02/21/2021   Procedure: BUBBLE STUDY;  Surgeon: Chilton Si, MD;  Location: [facility] ENDOSCOPY;  Service: Cardiovascular;;   IR ANGIO INTRA EXTRACRAN SEL COM CAROTID INNOMINATE UNI L MOD SED  02/15/2021   IR ANGIO VERTEBRAL SEL VERTEBRAL BILAT MOD SED  02/15/2021   IR CT HEAD LTD  02/15/2021   IR PERCUTANEOUS ART THROMBECTOMY/INFUSION INTRACRANIAL INC DIAG ANGIO  02/15/2021   IR US GUIDE VASC ACCESS RIGHT  02/15/2021   RADIOLOGY WITH ANESTHESIA N/A 02/14/2021   Procedure: IR WITH ANESTHESIA;  Surgeon: Julieanne Cotton, MD;  Location: [facility] OR;  Service: Radiology;  Laterality: N/A;   TEE WITHOUT CARDIOVERSION N/A 02/21/2021   Procedure: TRANSESOPHAGEAL ECHOCARDIOGRAM (TEE);  Surgeon: Chilton Si, MD;  Location: [facility] ENDOSCOPY;  Service: Cardiovascular;  Laterality: N/A;   Patient Active Problem List   Diagnosis Date Noted   Acquired subluxation of left shoulder 06/13/2021   Homonymous hemianopsia, left 06/13/2021   Sprain of left ankle    Sleep disturbance    Hyperglycemia    Hypoalbuminemia due to protein-calorie malnutrition (HCC)    Dyslipidemia    Hemiparesis affecting left  side as late effect of stroke (HCC)    Lethargy    Cerebrovascular accident (CVA) due to occlusion of right posterior cerebral artery (HCC) 02/22/2021   Cerebrovascular accident (CVA) (HCC)    Right carotid artery occlusion 02/14/2021     Rationale for Evaluation and Treatment Rehabilitation    ONSET DATE: Sept 2022, referral date 11/28/21  REFERRING DIAG: T70.017 (ICD-10-CM) - Cerebrovascular accident (CVA) due to occlusion of right posterior cerebral artery (HCC)   THERAPY DIAG:  Muscle weakness (generalized)  Other abnormalities of gait and mobility  Unsteadiness on feet  Other lack of coordination  Hemiplegia and hemiparesis following other cerebrovascular disease affecting left non-dominant side (HCC)  Other symptoms and signs involving the nervous system  Attention and concentration deficit  SUBJECTIVE:   SUBJECTIVE STATEMENT: 36 year old with history of right PCA infarcts in 02/2021 who returns to OPOT. Pt is familiar with this clinic and was a patient here previously but had to terminate POC d/t insurance visit limits. Pt is motivated and reports desire to return to PLOF for LUE  Pt accompanied by: self  PERTINENT HISTORY: right PCA infarcts in 02/2021, polysubstance abuse/tobacco, migrane headaches and obesity   PRECAUTIONS: Fall, Impulsive, Lt Hemi  WEIGHT BEARING RESTRICTIONS No  PAIN:  Are you having pain? No  FALLS: Has patient fallen in last 6 months? No  LIVING ENVIRONMENT: Lives with: lives with their family Lives in: House/apartment Stairs:  one level,  tub/shower combo Has following equipment at home: Single point cane and shower chair - currently standing for showers   PLOF: Vocation/Vocational requirements: disability, Leisure: hanging out with kids, fish, and Independent prior to CVA  PATIENT GOALS Pt motivated for return to PLOF for LUE.  OBJECTIVE:      HAND DOMINANCE: Right  ADLs: Overall ADLs: needs assistance Eating: needs  assistance cutting Grooming: mod I UB Dressing: mod I LB Dressing: needs assistance getting AFO on, shoes Toileting: mod I Bathing: assistance needed to wash RUE Tub Shower transfers: mod I Equipment: none   IADLs: Shopping: does not complete d/t not driving Light housekeeping: was not completing before Meal Prep: prepares food Community mobility: relies on family and friends Medication management: mod I Financial management: dependent   MOBILITY STATUS: Needs Assist: uses cane Pt is participating in [facility] [place] PT programs through [employer]  POSTURE COMMENTS:  rounded shoulders and forward head    UE ROM   - RUE WFL, did not assess LUE d/t spasticity/hemiplegia with exception of shoulder abduction. Pt unable to complete shoulder flexion at eval.  Active ROM Right 10/01/2021 Left 10/01/2021  Shoulder flexion  0  Shoulder abduction  90  Shoulder adduction    Shoulder extension    Shoulder internal rotation    Shoulder external rotation    Elbow flexion    Elbow extension    Wrist flexion    Wrist extension    Wrist ulnar deviation    Wrist radial deviation    Wrist pronation    Wrist supination    (Blank rows = not tested)   UE MMT:     MMT Right 10/01/2021 Left 10/01/2021  Shoulder flexion  Pt with imbalance with motor patterns demonstrating significant falling into synergy patterns with illiciting active movements. Absent wrist extension/flexion and composite extension with LUE.  Shoulder abduction    Shoulder adduction    Shoulder extension    Shoulder internal rotation    Shoulder external rotation    Middle trapezius    Lower trapezius    Elbow flexion    Elbow extension    Wrist flexion    Wrist extension    Wrist ulnar deviation    Wrist radial deviation    Wrist pronation    Wrist supination    (Blank rows = not tested)  HAND FUNCTION: Grip strength: Right: 129.1 lbs; Left: 35.7 lbs (facilitated by significant  spasticity)  COORDINATION: Unable to perform coordination tasks  d/t LUE hemiplegia  SENSATION: WFL  EDEMA: none  MUSCLE TONE: LUE: Severe  COGNITION: Overall cognitive status: Within functional limits for tasks assessed, impulsive  VISION:  VISION ASSESSMENT: Impaired Visual Fields: Left visual field deficits - pt received prisms but is not currently wearing them as reports it does not help  PERCEPTION: Impaired: continue to assess in functional context  OBSERVATIONS: pt with imbalance with muscle activation and movement patterns with LUE. Pt demonstrates increased activation and movement in LUE however needs work on Neuromuscular Reeducation for balance of active movements.  TODAY'S TREATMENT:  12/07/21  None today - evaluation only.   PATIENT EDUCATION: Education details: Education on role and purpose of OT; educated on refraining from any resistance exercises as patient was using resistance bands for bicep curls Person educated: Patient Education method: Explanation Education comprehension: verbalized understanding   HOME EXERCISE PROGRAM: 12/07/21 Encouraged to continue supine exercises from previous episode of care    GOALS: Goals reviewed with patient? No  SHORT TERM GOALS: Target date: 01/04/2022    Status:  1 Pt will be independent with HEP targeting LUE range of motion and NMR  Baseline:  Initial  2 Pt will demonstrate at least 10% active extension of LUE hand. Baseline: trace Initial  3 Pt will demonstrate active LUE movement to reach to low level surface for progression to reaching and obtaining objects  Baseline: trace activation Initial      LONG TERM GOALS: Target date: 02/15/2022    Status:  1 Pt will be independent with updated HEP.  Baseline:  Initial  2 Pt will be mod I with all basic ADLs.             Baseline: min A for bathing Initial  3 Pt will be independent with Aquatics HEP PRN Baseline:  Initial  4 Pt will demonstrate ability  to grasp and release cylindrical object and/or 1 inch block with LUE. Baseline: able to grasp/trace release Initial    ASSESSMENT:  CLINICAL IMPRESSION: Patient is a 36 y.o. male who was seen today for occupational therapy evaluation for Cerebrovascular accident (CVA) due to occlusion of right posterior cerebral artery. Pt presents with deficits with LUE hemiplegia, decreased strength, coordination and unsteadiness on feet. Skilled occupational therapy is recommended to target listed areas of deficit and increase independence with ADLs and IADLs and decrease caregiver burden.     PERFORMANCE DEFICITS in functional skills including ADLs, IADLs, coordination, dexterity, tone, ROM, strength, flexibility, FMC, GMC, balance, decreased knowledge of use of DME, vision, and UE functional use, cognitive skills including attention, and psychosocial skills including coping strategies and environmental adaptation.   IMPAIRMENTS are limiting patient from ADLs, IADLs, and leisure.   COMORBIDITIES may have co-morbidities  that affects occupational performance. Patient will benefit from skilled OT to address above impairments and improve overall function.  MODIFICATION OR ASSISTANCE TO COMPLETE EVALUATION: Min-Moderate modification of tasks or assist with assess necessary to complete an evaluation.  OT OCCUPATIONAL PROFILE AND HISTORY: Detailed assessment: Review of records and additional review of physical, cognitive, psychosocial history related to current functional performance.  CLINICAL DECISION MAKING: Moderate - several treatment options, min-mod task modification necessary  REHAB POTENTIAL: Good  EVALUATION COMPLEXITY: Moderate    PLAN: OT FREQUENCY: 1-2x/week (1x/week for land, may add another visit to accommodate aquatics if available for patient)  OT DURATION: 10 weeks  8 visits over 10 weeks d/t any scheduling conflicts and to conserve visits PRN for visit limit.  PLANNED INTERVENTIONS:  self care/ADL training, therapeutic exercise, therapeutic activity, neuromuscular re-education, manual therapy, passive range of motion, functional mobility training, aquatic therapy, splinting, electrical stimulation, fluidotherapy, patient/family education, cognitive remediation/compensation, energy conservation, coping strategies training, and DME and/or AE instructions  RECOMMENDED OTHER SERVICES: pt currently has PT POC  CONSULTED AND AGREED WITH PLAN OF CARE: Patient  PLAN FOR NEXT SESSION: LUE NMR, review supine HEP from previous episode of care, encourage more stabilized shoulder active assisted/closed chain movements to do at home, estim for extension?, weight bearing, Check on aquatics availability with Lanier Prude, OT 12/07/2021, 4:08 PM

## 2021-12-10 ENCOUNTER — Ambulatory Visit: Payer: BC Managed Care – PPO

## 2021-12-12 ENCOUNTER — Ambulatory Visit: Payer: BC Managed Care – PPO

## 2021-12-12 ENCOUNTER — Ambulatory Visit: Payer: BC Managed Care – PPO | Admitting: Occupational Therapy

## 2021-12-12 DIAGNOSIS — R41840 Attention and concentration deficit: Secondary | ICD-10-CM | POA: Diagnosis not present

## 2021-12-12 DIAGNOSIS — R29818 Other symptoms and signs involving the nervous system: Secondary | ICD-10-CM | POA: Diagnosis not present

## 2021-12-12 DIAGNOSIS — R2689 Other abnormalities of gait and mobility: Secondary | ICD-10-CM | POA: Diagnosis not present

## 2021-12-12 DIAGNOSIS — M6281 Muscle weakness (generalized): Secondary | ICD-10-CM

## 2021-12-12 DIAGNOSIS — R2681 Unsteadiness on feet: Secondary | ICD-10-CM

## 2021-12-12 DIAGNOSIS — R278 Other lack of coordination: Secondary | ICD-10-CM

## 2021-12-12 DIAGNOSIS — I69854 Hemiplegia and hemiparesis following other cerebrovascular disease affecting left non-dominant side: Secondary | ICD-10-CM | POA: Diagnosis not present

## 2021-12-12 NOTE — Therapy (Unsigned)
OUTPATIENT OCCUPATIONAL THERAPY NEURO TREATMENT  Patient Name: David Craig MRN: [id number] DOB:[birth year], 36 y.o., male Today's Date: 12/12/2021  PCP: Clementeen Graham, PA-C REFERRING PROVIDER: Ranelle Oyster, MD      No past medical history on file. Past Surgical History:  Procedure Laterality Date   BUBBLE STUDY  02/21/2021   Procedure: BUBBLE STUDY;  Surgeon: Chilton Si, MD;  Location: [facility] ENDOSCOPY;  Service: Cardiovascular;;   IR ANGIO INTRA EXTRACRAN SEL COM CAROTID INNOMINATE UNI L MOD SED  02/15/2021   IR ANGIO VERTEBRAL SEL VERTEBRAL BILAT MOD SED  02/15/2021   IR CT HEAD LTD  02/15/2021   IR PERCUTANEOUS ART THROMBECTOMY/INFUSION INTRACRANIAL INC DIAG ANGIO  02/15/2021   IR US GUIDE VASC ACCESS RIGHT  02/15/2021   RADIOLOGY WITH ANESTHESIA N/A 02/14/2021   Procedure: IR WITH ANESTHESIA;  Surgeon: Julieanne Cotton, MD;  Location: [facility] OR;  Service: Radiology;  Laterality: N/A;   TEE WITHOUT CARDIOVERSION N/A 02/21/2021   Procedure: TRANSESOPHAGEAL ECHOCARDIOGRAM (TEE);  Surgeon: Chilton Si, MD;  Location: [facility] ENDOSCOPY;  Service: Cardiovascular;  Laterality: N/A;   Patient Active Problem List   Diagnosis Date Noted   Acquired subluxation of left shoulder 06/13/2021   Homonymous hemianopsia, left 06/13/2021   Sprain of left ankle    Sleep disturbance    Hyperglycemia    Hypoalbuminemia due to protein-calorie malnutrition (HCC)    Dyslipidemia    Hemiparesis affecting left side as late effect of stroke (HCC)    Lethargy    Cerebrovascular accident (CVA) due to occlusion of right posterior cerebral artery (HCC) 02/22/2021   Cerebrovascular accident (CVA) (HCC)    Right carotid artery occlusion 02/14/2021     Rationale for Evaluation and Treatment Rehabilitation    ONSET DATE: Sept 2022, referral date 11/28/21  REFERRING DIAG: K02.542 (ICD-10-CM) - Cerebrovascular accident (CVA) due to occlusion of right posterior cerebral artery (HCC)   THERAPY DIAG:   No diagnosis found.  SUBJECTIVE:   SUBJECTIVE STATEMENT: Pt reports he wants to get his arm working  Pt accompanied by: self, mom PERTINENT HISTORY: right PCA infarcts in 02/2021, polysubstance abuse/tobacco, migrane headaches and obesity   PRECAUTIONS: Fall, Impulsive, Lt Hemi  WEIGHT BEARING RESTRICTIONS No  PAIN:  Are you having pain? No  FALLS: Has patient fallen in last 6 months? No  LIVING ENVIRONMENT: Lives with: lives with their family Lives in: House/apartment Stairs:  one level, tub/shower combo Has following equipment at home: Single point cane and shower chair - currently standing for showers   PLOF: Vocation/Vocational requirements: disability, Leisure: hanging out with kids, fish, and Independent prior to CVA  PATIENT GOALS Pt motivated for return to PLOF for LUE.  OBJECTIVE:        TODAY'S TREATMENT:  Supine pt performed closed chain self ROM, shoulder flexion, shoulder / scapular retraction/ protraction. Discussion with pt/ mother about exercising LUE gentle and pt is vey aggressive with LUE when moving it. Therapist reinforced that better slower , quality movement is more important than high rep exercise while malpositioned. Pt/ mom were instructed in HEP in sidelying and weight bearing, mod v.c initally then pt returned demonstration. See pt instructions.   PATIENT EDUCATION: Education details: HEP in sidelying for A/ROM and weightbearing in seated- see pt instructions Person educated: Patient, mom Education method: Explanation,demonstration, verbal cues Education comprehension: verbalized understanding, returned demonstration, needs reinforcement   HOME EXERCISE PROGRAM: 12/07/21 Encouraged to continue supine exercises from previous episode of care 12/13/21  HEP in sidelying for A/ROM and weightbearing  in seated-     GOALS: Goals reviewed with patient? No  SHORT TERM GOALS: Target date: 01/09/2022    Status:  1 Pt will be independent with  HEP targeting LUE range of motion and NMR  Baseline:  Initial  2 Pt will demonstrate at least 10% active extension of LUE hand. Baseline: trace Initial  3 Pt will demonstrate active LUE movement to reach to low level surface for progression to reaching and obtaining objects  Baseline: trace activation Initial      LONG TERM GOALS: Target date: 02/20/2022    Status:  1 Pt will be independent with updated HEP.  Baseline:  Initial  2 Pt will be mod I with all basic ADLs.             Baseline: min A for bathing Initial  3 Pt will be independent with Aquatics HEP PRN Baseline:  Initial  4 Pt will demonstrate ability to grasp and release cylindrical object and/or 1 inch block with LUE. Baseline: able to grasp/trace release Initial    ASSESSMENT:  CLINICAL IMPRESSION: Pt/ and mom verbalize understanding of updated HEP. PERFORMANCE DEFICITS in functional skills including ADLs, IADLs, coordination, dexterity, tone, ROM, strength, flexibility, FMC, GMC, balance, decreased knowledge of use of DME, vision, and UE functional use, cognitive skills including attention, and psychosocial skills including coping strategies and environmental adaptation.   IMPAIRMENTS are limiting patient from ADLs, IADLs, and leisure.   COMORBIDITIES may have co-morbidities  that affects occupational performance. Patient will benefit from skilled OT to address above impairments and improve overall function.  MODIFICATION OR ASSISTANCE TO COMPLETE EVALUATION: Min-Moderate modification of tasks or assist with assess necessary to complete an evaluation.  OT OCCUPATIONAL PROFILE AND HISTORY: Detailed assessment: Review of records and additional review of physical, cognitive, psychosocial history related to current functional performance.  CLINICAL DECISION MAKING: Moderate - several treatment options, min-mod task modification necessary  REHAB POTENTIAL: Good  EVALUATION COMPLEXITY: Moderate    PLAN: OT  FREQUENCY: 1-2x/week (1x/week for land, may add another visit to accommodate aquatics if available for patient)  OT DURATION: 10 weeks  8 visits over 10 weeks d/t any scheduling conflicts and to conserve visits PRN for visit limit.  PLANNED INTERVENTIONS: self care/ADL training, therapeutic exercise, therapeutic activity, neuromuscular re-education, manual therapy, passive range of motion, functional mobility training, aquatic therapy, splinting, electrical stimulation, fluidotherapy, patient/family education, cognitive remediation/compensation, energy conservation, coping strategies training, and DME and/or AE instructions  RECOMMENDED OTHER SERVICES: pt currently has PT POC  CONSULTED AND AGREED WITH PLAN OF CARE: Patient  PLAN FOR NEXT SESSION: LUE NMR, review HEP issued last visit, check on aquatics Javontay Vandam, OT 12/12/2021, 11:39 AM

## 2021-12-12 NOTE — Patient Instructions (Addendum)
Lay on your left side with left arm outstretched, place towel roll under forearm, bend and straighten elbow slowly, 5-10 reps at a time then take a rest. (Turn the arm on 90%, no gritting your teeth.) Lay on your left side with left arm outstretched, turn arm up and down (palm up and down ), 2 sets of 5 reps, slowly and gently  Lateral Weight Shift: Upper Trunk Leading   Sit with feet flat on floor. Bring _left___ shoulder, head and arm toward side.. Hold __5-10__ seconds. Return to upright position by pushing through arm gently. Repeat _5-10___ times per session. Do __2__ sessions per day.      Marland Kitchen

## 2021-12-12 NOTE — Therapy (Signed)
OUTPATIENT PHYSICAL THERAPY TREATMENT NOTE   Patient Name: David Craig MRN: [id number] DOB:[birth year], 36 y.o., male Today's Date: 12/12/2021  PCP: Clementeen Graham, PA-C REFERRING PROVIDER: Faith Rogue, MD  END OF SESSION:   PT End of Session - 12/12/21 1402     Visit Number 3    Number of Visits 17    Date for PT Re-Evaluation 01/23/22    Authorization Type [employer] (VL: 30 PT/OT) per year    PT Start Time 1401    PT Stop Time 1445    PT Time Calculation (min) 44 min    Activity Tolerance Patient tolerated treatment well    Behavior During Therapy [facility] for tasks assessed/performed              History reviewed. No pertinent past medical history. Past Surgical History:  Procedure Laterality Date   BUBBLE STUDY  02/21/2021   Procedure: BUBBLE STUDY;  Surgeon: Chilton Si, MD;  Location: [facility] ENDOSCOPY;  Service: Cardiovascular;;   IR ANGIO INTRA EXTRACRAN SEL COM CAROTID INNOMINATE UNI L MOD SED  02/15/2021   IR ANGIO VERTEBRAL SEL VERTEBRAL BILAT MOD SED  02/15/2021   IR CT HEAD LTD  02/15/2021   IR PERCUTANEOUS ART THROMBECTOMY/INFUSION INTRACRANIAL INC DIAG ANGIO  02/15/2021   IR US GUIDE VASC ACCESS RIGHT  02/15/2021   RADIOLOGY WITH ANESTHESIA N/A 02/14/2021   Procedure: IR WITH ANESTHESIA;  Surgeon: Julieanne Cotton, MD;  Location: [facility] OR;  Service: Radiology;  Laterality: N/A;   TEE WITHOUT CARDIOVERSION N/A 02/21/2021   Procedure: TRANSESOPHAGEAL ECHOCARDIOGRAM (TEE);  Surgeon: Chilton Si, MD;  Location: [facility] ENDOSCOPY;  Service: Cardiovascular;  Laterality: N/A;   Patient Active Problem List   Diagnosis Date Noted   Acquired subluxation of left shoulder 06/13/2021   Homonymous hemianopsia, left 06/13/2021   Sprain of left ankle    Sleep disturbance    Hyperglycemia    Hypoalbuminemia due to protein-calorie malnutrition (HCC)    Dyslipidemia    Hemiparesis affecting left side as late effect of stroke (HCC)    Lethargy    Cerebrovascular accident (CVA)  due to occlusion of right posterior cerebral artery (HCC) 02/22/2021   Cerebrovascular accident (CVA) (HCC)    Right carotid artery occlusion 02/14/2021    REFERRING DIAG: J68.115 (ICD-10-CM) - Hemiparesis affecting left side as late effect of stroke (HCC)  THERAPY DIAG:  Muscle weakness (generalized)  Other abnormalities of gait and mobility  Unsteadiness on feet  Rationale for Evaluation and Treatment Rehabilitation  PERTINENT HISTORY: R PCA CVA, polysubstance abuse, migraine  PRECAUTIONS: Fall  SUBJECTIVE: Patient reports no new changes. Liking the heel lift. No pain.   PAIN:  Are you having pain? No   OBJECTIVE:  TODAY'S TREATMENT:  TherEx:   Completed SciFit on Level 2.0 with BLE only, completed x 5 minutes for warm up, reciprocal movement, and BLE strengthening.   GAIT: Gait pattern: step to pattern, decreased arm swing- Left, decreased step length- Right, decreased stance time- Left, decreased hip/knee flexion- Left, decreased ankle dorsiflexion- Left, Left hip hike, genu recurvatum- Left, and wide BOS Distance walked: 50' x 2 (for gait assessment)  Assistive device utilized: Single point cane, No AD Level of assistance: Supervision Comments: continued use of Heel Lift in L Shoe with AFO. Compared gait without AD and with AD. No significant changes other than more noticeable abduction at lower limb for clearance without AD. No recurvatum noted with continued use of heel lift.   NMR:   Completed hip flexion with 6"  step, (cones placed along L side to promote reduced circumduction/compensation) with verbal cues to avoid touching/knocking over cones to promote improved hip/knee flexion of LLE. Completed x 15 reps.    Completed posterior step up with 4" step with LLE x 15 reps with single UE support, also leading with RLE off step for eccentric strengthening of LLE. Supervision cues for technique.    Completed resisted posterior stepping with LLE using yellow theraband x  10 reps, PT facilitating for improved hip alignment and postural alignment with completion      Completed standing hip adduction with LLE x 10 reps without resistance, last x 5 reps added in yellow theraband for improved strengthening. Added to HEP (See medbridge for details).   PATIENT EDUCATION: Education details: Continue use of heel lift; Hip Adduction Person educated: Patient and Spouse Education method: Explanation Education comprehension: verbalized understanding     HOME EXERCISE PROGRAM:    GOALS: Goals reviewed with patient? Yes   SHORT TERM GOALS: Target date: 12/26/2021   Pt will be independent with initial HEP for improved balance and gait mechanics  Baseline:to be determined Goal status: INITIAL   2.  Pt will improve FGA to >/= 21/30 to demonstrate improved balance and reduced fall risk   Baseline: 16/30 Goal status: INITIAL   3.  Pt will improve gait speed to >/= .7m/s to demonstrate improved community ambulation   Baseline: .54m/s Goal status: INITIAL   4.  L ankle DF ROM to be measured and LTG/STG to be added as appropriate Baseline: to be measured Goal status: INITIAL     LONG TERM GOALS: Target date: 01/23/2022   Pt will be independent with final HEP for improved balance and gait mechanics  Baseline: to be provided Goal status: INITIAL   2.  Pt will improve FGA to >/= 26/30 to demonstrate improved balance and reduced fall risk   Baseline: 16/30 Goal status: INITIAL   3.  Pt will improve gait speed to >/= .29m/s to demonstrate improved community ambulation   Baseline: .37m/s Goal status: INITIAL       ASSESSMENT:   CLINICAL IMPRESSION: Today's skilled PT session focused on continued LLE strengthening and activities to promote weight shift/stance and postural alignment. Patient tolerating activities well. Will continue per POC.    OBJECTIVE IMPAIRMENTS Abnormal gait, decreased activity tolerance, decreased balance, decreased coordination,  decreased endurance, decreased knowledge of condition, decreased knowledge of use of DME, decreased mobility, difficulty walking, decreased ROM, and decreased strength.    ACTIVITY LIMITATIONS carrying, lifting, bending, standing, squatting, stairs, transfers, bed mobility, locomotion level, and caring for others   PARTICIPATION LIMITATIONS: meal prep, cleaning, laundry, driving, shopping, community activity, occupation, and yard work   PERSONAL FACTORS Age, Fitness, Profession, Time since onset of injury/illness/exacerbation, and 1-2 comorbidities: polysubstance abuse, migraines  are also affecting patient's functional outcome.    REHAB POTENTIAL: Fair time since onset   CLINICAL DECISION MAKING: Stable/uncomplicated   EVALUATION COMPLEXITY: Low   PLAN: PT FREQUENCY: 2x/week   PT DURATION: 8 weeks   PLANNED INTERVENTIONS: Therapeutic exercises, Therapeutic activity, Neuromuscular re-education, Balance training, Gait training, Patient/Family education, Joint mobilization, Stair training, Vestibular training, Orthotic/Fit training, DME instructions, Aquatic Therapy, Electrical stimulation, Cryotherapy, Moist heat, Splintting, Taping, Manual therapy, and Re-evaluation   PLAN FOR NEXT SESSION: continue use of heel lift. Functional LLE strengthening. Balance    Marlowe Kays, PT, DPT 12/12/2021, 3:42 PM

## 2021-12-13 ENCOUNTER — Encounter: Payer: Self-pay | Admitting: Occupational Therapy

## 2021-12-17 ENCOUNTER — Ambulatory Visit: Payer: BC Managed Care – PPO

## 2021-12-19 DIAGNOSIS — I63531 Cerebral infarction due to unspecified occlusion or stenosis of right posterior cerebral artery: Secondary | ICD-10-CM | POA: Diagnosis not present

## 2021-12-20 ENCOUNTER — Ambulatory Visit: Payer: BC Managed Care – PPO

## 2021-12-20 ENCOUNTER — Ambulatory Visit: Payer: BC Managed Care – PPO | Admitting: Occupational Therapy

## 2021-12-20 ENCOUNTER — Encounter: Payer: Self-pay | Admitting: Occupational Therapy

## 2021-12-20 DIAGNOSIS — R2681 Unsteadiness on feet: Secondary | ICD-10-CM

## 2021-12-20 DIAGNOSIS — M6281 Muscle weakness (generalized): Secondary | ICD-10-CM | POA: Diagnosis not present

## 2021-12-20 DIAGNOSIS — R41840 Attention and concentration deficit: Secondary | ICD-10-CM

## 2021-12-20 DIAGNOSIS — I69854 Hemiplegia and hemiparesis following other cerebrovascular disease affecting left non-dominant side: Secondary | ICD-10-CM | POA: Diagnosis not present

## 2021-12-20 DIAGNOSIS — R29818 Other symptoms and signs involving the nervous system: Secondary | ICD-10-CM | POA: Diagnosis not present

## 2021-12-20 DIAGNOSIS — R2689 Other abnormalities of gait and mobility: Secondary | ICD-10-CM

## 2021-12-20 DIAGNOSIS — R278 Other lack of coordination: Secondary | ICD-10-CM | POA: Diagnosis not present

## 2021-12-20 NOTE — Therapy (Signed)
OUTPATIENT PHYSICAL THERAPY TREATMENT NOTE   Patient Name: David Craig MRN: [id number] DOB:[birth year], 36 y.o., male Today's Date: 12/20/2021  PCP: Maude Leriche, PA-C REFERRING PROVIDER: Alger Simons, MD  END OF SESSION:   PT End of Session - 12/20/21 1438     Visit Number 4    Number of Visits 17    Date for PT Re-Evaluation 01/23/22    Authorization Type BCBS (VL: 30 PT/OT) per year    PT Start Time 7846    PT Stop Time 1530    PT Time Calculation (min) 45 min    Equipment Utilized During Treatment Gait belt    Activity Tolerance Patient tolerated treatment well    Behavior During Therapy WFL for tasks assessed/performed              History reviewed. No pertinent past medical history. Past Surgical History:  Procedure Laterality Date   BUBBLE STUDY  02/21/2021   Procedure: BUBBLE STUDY;  Surgeon: Skeet Latch, MD;  Location: [place];  Service: Cardiovascular;;   IR ANGIO INTRA EXTRACRAN SEL COM CAROTID INNOMINATE UNI L MOD SED  02/15/2021   IR ANGIO VERTEBRAL SEL VERTEBRAL BILAT MOD SED  02/15/2021   IR CT HEAD LTD  02/15/2021   IR PERCUTANEOUS ART THROMBECTOMY/INFUSION INTRACRANIAL INC DIAG ANGIO  02/15/2021   IR US GUIDE VASC ACCESS RIGHT  02/15/2021   RADIOLOGY WITH ANESTHESIA N/A 02/14/2021   Procedure: IR WITH ANESTHESIA;  Surgeon: Luanne Bras, MD;  Location: [place];  Service: Radiology;  Laterality: N/A;   TEE WITHOUT CARDIOVERSION N/A 02/21/2021   Procedure: TRANSESOPHAGEAL ECHOCARDIOGRAM (TEE);  Surgeon: Skeet Latch, MD;  Location: [place];  Service: Cardiovascular;  Laterality: N/A;   Patient Active Problem List   Diagnosis Date Noted   Acquired subluxation of left shoulder 06/13/2021   Homonymous hemianopsia, left 06/13/2021   Sprain of left ankle    Sleep disturbance    Hyperglycemia    Hypoalbuminemia due to protein-calorie malnutrition ([place])    Dyslipidemia    Hemiparesis affecting left side as late effect of stroke ([place])     Lethargy    Cerebrovascular accident (CVA) due to occlusion of right posterior cerebral artery ([place]) 02/22/2021   Cerebrovascular accident (CVA) ([place])    Right carotid artery occlusion 02/14/2021    REFERRING DIAG: N62.952 (ICD-10-CM) - Hemiparesis affecting left side as late effect of stroke ([place])  THERAPY DIAG:  Muscle weakness (generalized)  Other abnormalities of gait and mobility  Unsteadiness on feet  Other lack of coordination  Rationale for Evaluation and Treatment Rehabilitation  PERTINENT HISTORY: R PCA CVA, polysubstance abuse, migraine  PRECAUTIONS: Fall  SUBJECTIVE: Patient reports doing well. Trying to walk as much as possible at home. No falls.   PAIN:  Are you having pain? No   OBJECTIVE:  TODAY'S TREATMENT:   [facility] PT Assessment - 12/20/21 0001       Standardized Balance Assessment   Standardized Balance Assessment 10 meter walk test    10 Meter Walk .59ms      Functional Gait  Assessment   Gait assessed  Yes    Gait Level Surface Walks 20 ft in less than 7 sec but greater than 5.5 sec, uses assistive device, slower speed, mild gait deviations, or deviates 6-10 in outside of the 12 in walkway width.    Change in Gait Speed Able to change speed, demonstrates mild gait deviations, deviates 6-10 in outside of the 12 in walkway width, or no gait deviations, unable  to achieve a major change in velocity, or uses a change in velocity, or uses an assistive device.    Gait with Horizontal Head Turns Performs head turns smoothly with slight change in gait velocity (eg, minor disruption to smooth gait path), deviates 6-10 in outside 12 in walkway width, or uses an assistive device.    Gait with Vertical Head Turns Performs task with slight change in gait velocity (eg, minor disruption to smooth gait path), deviates 6 - 10 in outside 12 in walkway width or uses assistive device    Gait and Pivot Turn Pivot turns safely within 3 sec and stops quickly with no loss  of balance.    Step Over Obstacle Is able to step over one shoe box (4.5 in total height) without changing gait speed. No evidence of imbalance.    Gait with Narrow Base of Support Ambulates 7-9 steps.    Gait with Eyes Closed Walks 20 ft, no assistive devices, good speed, no evidence of imbalance, normal gait pattern, deviates no more than 6 in outside 12 in walkway width. Ambulates 20 ft in less than 7 sec.    Ambulating Backwards Walks 20 ft, uses assistive device, slower speed, mild gait deviations, deviates 6-10 in outside 12 in walkway width.    Steps Two feet to a stair, must use rail.    Total Score 21            DF ROM: -18* from neutral  PF ROM: 27*  NMR:  -fwd/backward step over 2" beam with visual and verbal cues to minimize L LE circumduction (tap to heel)  -fwd/backward step over 2" beam R LE with controlled support on L LE -sidelying hip ext 2x12 -sidelying hamstring curl 2x12 -standing calf stretch 2x30s -seated calf stretch 2x30s   PATIENT EDUCATION: Education details: HEP, OM results Person educated: Patient and Spouse Education method: Explanation Education comprehension: verbalized understanding     HOME EXERCISE PROGRAM: Access Code: [email or web address] URL: [email or web address] Date: 12/07/2021 Prepared by: Baldomero Lamy   Exercises - Seated Heel Slide  - 1 x daily - 5 x weekly - 2 sets - 10 reps (Completed first set with towel under LLE x 10 reps, second set compelted with yellow theraband x  10 reps), cues for technique - Step Up  - 1 x daily - 5 x weekly - 2 sets - 10 reps (leading with LLE and focus on eccentric control with reduced UE support to promote LLE strengthening) - No Picture Provided as Unable to Find Proper Picture - Sit to Stands with 4" Block under RLE (cues for technique)  - Standing Ankle Dorsiflexion Stretch  - 1 x daily - 7 x weekly - 3 sets - 10 reps - Long Sitting Calf Stretch with Strap  - 1 x daily - 7 x weekly - 3 sets -  10 reps   GOALS: Goals reviewed with patient? Yes   SHORT TERM GOALS: Target date: 12/26/2021   Pt will be independent with initial HEP for improved balance and gait mechanics  Baseline: provided Goal status: MET   2.  Pt will improve FGA to >/= 21/30 to demonstrate improved balance and reduced fall risk   Baseline: 16/30; 21/30 Goal status: MET   3.  Pt will improve gait speed to >/= .66ms to demonstrate improved community ambulation   Baseline: .389m; .3850m Goal status: NOT MET   4.  L ankle DF ROM to be measured and LTG/STG to be added as appropriate  Baseline: to be measured Goal status: INITIAL     LONG TERM GOALS: Target date: 01/23/2022   Pt will be independent with final HEP for improved balance and gait mechanics  Baseline: to be provided Goal status: INITIAL   2.  Pt will improve FGA to >/= 26/30 to demonstrate improved balance and reduced fall risk   Baseline: 16/30 Goal status: INITIAL   3.  Pt will improve gait speed to >/= .61ms to demonstrate improved community ambulation   Baseline: .325m Goal status: INITIAL       ASSESSMENT:   CLINICAL IMPRESSION: Patient seen for skilled PT session with emphasis on L hemi NMR and goal assessment. He met 3/4 STG and is progressing well toward remaining STG. Patient demonstrates increased fall risk as noted by score of 21/30 on  Functional Gait Assessment.   <22/30 = predictive of falls, <20/30 = fall in 6 months, <18/30 = predictive of falls in PD MCID: 5 points stroke population, 4 points geriatric population (ANPTA Core Set of Outcome Measures for Adults with Neurologic Conditions, 2018). 10 Meter Walk Test: Patient instructed to walk 10 meters (32.8 ft) as quickly and as safely as possible at their normal speed x2 and at a fast speed x2. Time measured from 2 meter mark to 8 meter mark to accommodate ramp-up and ramp-down.  Normal speed: .3845mCut off scores: <0.4 m/s = household Ambulator, 0.4-0.8 m/s =  limited community Ambulator, >0.8 m/s = community Ambulator, >1.2 m/s = crossing a street, <1.0 = increased fall risk MCID 0.05 m/s (small), 0.13 m/s (moderate), 0.06 m/s (significant)  (ANPTA Core Set of Outcome Measures for Adults with Neurologic Conditions, 2018). He remains with spastic gait impeding his ability to ambulate faster and with smoother gait. Extensor tone noted with L ankle supination without AFO. Decreased instance of genu recurvatum noted, though. Continue POC.    OBJECTIVE IMPAIRMENTS Abnormal gait, decreased activity tolerance, decreased balance, decreased coordination, decreased endurance, decreased knowledge of condition, decreased knowledge of use of DME, decreased mobility, difficulty walking, decreased ROM, and decreased strength.    ACTIVITY LIMITATIONS carrying, lifting, bending, standing, squatting, stairs, transfers, bed mobility, locomotion level, and caring for others   PARTICIPATION LIMITATIONS: meal prep, cleaning, laundry, driving, shopping, community activity, occupation, and yard work   PERSONAL FACTORS Age, Fitness, Profession, Time since onset of injury/illness/exacerbation, and 1-2 comorbidities: polysubstance abuse, migraines  are also affecting patient's functional outcome.    REHAB POTENTIAL: Fair time since onset   CLINICAL DECISION MAKING: Stable/uncomplicated   EVALUATION COMPLEXITY: Low   PLAN: PT FREQUENCY: 2x/week   PT DURATION: 8 weeks   PLANNED INTERVENTIONS: Therapeutic exercises, Therapeutic activity, Neuromuscular re-education, Balance training, Gait training, Patient/Family education, Joint mobilization, Stair training, Vestibular training, Orthotic/Fit training, DME instructions, Aquatic Therapy, Electrical stimulation, Cryotherapy, Moist heat, Splintting, Taping, Manual therapy, and Re-evaluation   PLAN FOR NEXT SESSION: continue use of heel lift. Functional LLE strengthening. Balance, ambulatory balance    JenDebbora DusT,  DPT JenDebbora DusT, DPT, CBIS  12/20/2021, 4:06 PM

## 2021-12-20 NOTE — Therapy (Signed)
OUTPATIENT OCCUPATIONAL THERAPY NEURO TREATMENT  Patient Name: David Craig MRN: [id number] DOB:Mar 06, 1986, 36 y.o., male Today's Date: 12/20/2021  PCP: Clementeen Graham, PA-C REFERRING PROVIDER: Ranelle Oyster, MD    OT End of Session - 12/20/21 1534     Visit Number 3    Number of Visits 17    Date for OT Re-Evaluation 02/15/22    Authorization Type BCBS    Authorization Time Period VL: 30 PT/OT    OT Start Time 1532    OT Stop Time 1615    OT Time Calculation (min) 43 min    Activity Tolerance Patient tolerated treatment well    Behavior During Therapy [facility] for tasks assessed/performed              History reviewed. No pertinent past medical history. Past Surgical History:  Procedure Laterality Date   BUBBLE STUDY  02/21/2021   Procedure: BUBBLE STUDY;  Surgeon: Chilton Si, MD;  Location[facility] ENDOSCOPY;  Service: Cardiovascular;;   IR ANGIO INTRA EXTRACRAN SEL COM CAROTID INNOMINATE UNI L MOD SED  02/15/2021   IR ANGIO VERTEBRAL SEL VERTEBRAL BILAT MOD SED  02/15/2021   IR CT HEAD LTD  02/15/2021   IR PERCUTANEOUS ART THROMBECTOMY/INFUSION INTRACRANIAL INC DIAG ANGIO  02/15/2021   IR US GUIDE VASC ACCESS RIGHT  02/15/2021   RADIOLOGY WITH ANESTHESIA N/A 02/14/2021   Procedure: IR WITH ANESTHESIA;  Surgeon: Julieanne Cotton, MD;  Location: [facility];  Service: Radiology;  Laterality: N/A;   TEE WITHOUT CARDIOVERSION N/A 02/21/2021   Procedure: TRANSESOPHAGEAL ECHOCARDIOGRAM (TEE);  Surgeon: Chilton Si, MD;  Location: [facility] ENDOSCOPY;  Service: Cardiovascular;  Laterality: N/A;   Patient Active Problem List   Diagnosis Date Noted   Acquired subluxation of left shoulder 06/13/2021   Homonymous hemianopsia, left 06/13/2021   Sprain of left ankle    Sleep disturbance    Hyperglycemia    Hypoalbuminemia due to protein-calorie malnutrition (HCC)    Dyslipidemia    Hemiparesis affecting left side as late effect of stroke (HCC)    Lethargy    Cerebrovascular  accident (CVA) due to occlusion of right posterior cerebral artery (HCC) 02/22/2021   Cerebrovascular accident (CVA) (HCC)    Right carotid artery occlusion 02/14/2021     Rationale for Evaluation and Treatment Rehabilitation    ONSET DATE: Sept 2022, referral date 11/28/21  REFERRING DIAG: M08.676 (ICD-10-CM) - Cerebrovascular accident (CVA) due to occlusion of right posterior cerebral artery (HCC)   THERAPY DIAG:  Muscle weakness (generalized)  Other abnormalities of gait and mobility  Unsteadiness on feet  Other lack of coordination  Hemiplegia and hemiparesis following other cerebrovascular disease affecting left non-dominant side (HCC)  Other symptoms and signs involving the nervous system  Attention and concentration deficit  SUBJECTIVE:   SUBJECTIVE STATEMENT: You know what I wanna try - I tried doing the exercises at home and I couldn't replicate it.   Pt accompanied by: self, mom PERTINENT HISTORY: right PCA infarcts in 02/2021, polysubstance abuse/tobacco, migrane headaches and obesity   PRECAUTIONS: Fall, Impulsive, Lt Hemi   PATIENT GOALS Pt motivated for return to PLOF for LUE.  OBJECTIVE:        TODAY'S TREATMENT:  Sidelying on affected side with active and isolated supination/pronation, elbow flexion and extension and weight bearing into shoulder with decreasing imbalance of overactivation of muscles.   Hemi Glide with LUE with emphasis on modulating movements and activation for place and holds and scapular protraction with mod A for support.  Weight bearing in LUE for inhibition of tone and facilitation of muscle activation while reaching with RUE. Pt required mod assistance for maintaining hand position on mat for weight bearing.    On counter with weight shifts laterally into LUE      HOME EXERCISE PROGRAM: 12/07/21 Encouraged to continue supine exercises from previous episode of care 12/13/21  HEP in sidelying for A/ROM and weightbearing  in seated-       GOALS: Goals reviewed with patient? No  SHORT TERM GOALS: Target date: 01/04/2022    Status:  1 Pt will be independent with HEP targeting LUE range of motion and NMR  Baseline:  Initial  2 Pt will demonstrate at least 10% active extension of LUE hand. Baseline: trace Initial  3 Pt will demonstrate active LUE movement to reach to low level surface for progression to reaching and obtaining objects  Baseline: trace activation Initial      LONG TERM GOALS: Target date: 02/15/2022    Status:  1 Pt will be independent with updated HEP.  Baseline:  Initial  2 Pt will be mod I with all basic ADLs.             Baseline: min A for bathing Initial  3 Pt will be independent with Aquatics HEP PRN Baseline:  Initial  4 Pt will demonstrate ability to grasp and release cylindrical object and/or 1 inch block with LUE. Baseline: able to grasp/trace release Initial    ASSESSMENT:  CLINICAL IMPRESSION: Pt continues to demonstrate over activation of LUE during active movement however responded well to putting less effort into movements and with decreased compensations.  PERFORMANCE DEFICITS in functional skills including ADLs, IADLs, coordination, dexterity, tone, ROM, strength, flexibility, FMC, GMC, balance, decreased knowledge of use of DME, vision, and UE functional use, cognitive skills including attention, and psychosocial skills including coping strategies and environmental adaptation.   IMPAIRMENTS are limiting patient from ADLs, IADLs, and leisure.   COMORBIDITIES may have co-morbidities  that affects occupational performance. Patient will benefit from skilled OT to address above impairments and improve overall function.  MODIFICATION OR ASSISTANCE TO COMPLETE EVALUATION: Min-Moderate modification of tasks or assist with assess necessary to complete an evaluation.  OT OCCUPATIONAL PROFILE AND HISTORY: Detailed assessment: Review of records and additional review of  physical, cognitive, psychosocial history related to current functional performance.  CLINICAL DECISION MAKING: Moderate - several treatment options, min-mod task modification necessary  REHAB POTENTIAL: Good  EVALUATION COMPLEXITY: Moderate    PLAN: OT FREQUENCY: 1-2x/week (1x/week for land, may add another visit to accommodate aquatics if available for patient)  OT DURATION: 10 weeks  8 visits over 10 weeks d/t any scheduling conflicts and to conserve visits PRN for visit limit.  PLANNED INTERVENTIONS: self care/ADL training, therapeutic exercise, therapeutic activity, neuromuscular re-education, manual therapy, passive range of motion, functional mobility training, aquatic therapy, splinting, electrical stimulation, fluidotherapy, patient/family education, cognitive remediation/compensation, energy conservation, coping strategies training, and DME and/or AE instructions  RECOMMENDED OTHER SERVICES: pt currently has PT POC  CONSULTED AND AGREED WITH PLAN OF CARE: Patient  PLAN FOR NEXT SESSION: LUE NMR, review HEP issued last visit, check on aquatics  Caprice Mccaffrey M Jaziah Goeller, OT 12/20/2021, 4:06 PM

## 2021-12-24 ENCOUNTER — Ambulatory Visit: Payer: BC Managed Care – PPO

## 2021-12-27 ENCOUNTER — Ambulatory Visit: Payer: BC Managed Care – PPO

## 2021-12-27 DIAGNOSIS — M6281 Muscle weakness (generalized): Secondary | ICD-10-CM

## 2021-12-27 DIAGNOSIS — I6930 Unspecified sequelae of cerebral infarction: Secondary | ICD-10-CM | POA: Diagnosis not present

## 2021-12-27 DIAGNOSIS — R41840 Attention and concentration deficit: Secondary | ICD-10-CM | POA: Diagnosis not present

## 2021-12-27 DIAGNOSIS — R030 Elevated blood-pressure reading, without diagnosis of hypertension: Secondary | ICD-10-CM | POA: Diagnosis not present

## 2021-12-27 DIAGNOSIS — R278 Other lack of coordination: Secondary | ICD-10-CM | POA: Diagnosis not present

## 2021-12-27 DIAGNOSIS — G4700 Insomnia, unspecified: Secondary | ICD-10-CM | POA: Diagnosis not present

## 2021-12-27 DIAGNOSIS — R2681 Unsteadiness on feet: Secondary | ICD-10-CM

## 2021-12-27 DIAGNOSIS — E7800 Pure hypercholesterolemia, unspecified: Secondary | ICD-10-CM | POA: Diagnosis not present

## 2021-12-27 DIAGNOSIS — R29818 Other symptoms and signs involving the nervous system: Secondary | ICD-10-CM | POA: Diagnosis not present

## 2021-12-27 DIAGNOSIS — R2689 Other abnormalities of gait and mobility: Secondary | ICD-10-CM

## 2021-12-27 DIAGNOSIS — I69854 Hemiplegia and hemiparesis following other cerebrovascular disease affecting left non-dominant side: Secondary | ICD-10-CM | POA: Diagnosis not present

## 2021-12-27 NOTE — Therapy (Signed)
OUTPATIENT PHYSICAL THERAPY TREATMENT NOTE   Patient Name: David Craig MRN: [id number] DOB:06-Apr-1986, 36 y.o., male Today's Date: 12/27/2021  PCP: Maude Leriche, PA-C REFERRING PROVIDER: Alger Simons, MD  END OF SESSION:   PT End of Session - 12/27/21 1445     Visit Number 5    Number of Visits 17    Date for PT Re-Evaluation 01/23/22    Authorization Type BCBS (VL: 30 PT/OT) per year    PT Start Time 4098    PT Stop Time 1529    PT Time Calculation (min) 44 min    Equipment Utilized During Treatment Gait belt    Activity Tolerance Patient tolerated treatment well    Behavior During Therapy WFL for tasks assessed/performed              History reviewed. No pertinent past medical history. Past Surgical History:  Procedure Laterality Date   BUBBLE STUDY  02/21/2021   Procedure: BUBBLE STUDY;  Surgeon: Skeet Latch, MD;  Location: [place];  Service: Cardiovascular;;   IR ANGIO INTRA EXTRACRAN SEL COM CAROTID INNOMINATE UNI L MOD SED  02/15/2021   IR ANGIO VERTEBRAL SEL VERTEBRAL BILAT MOD SED  02/15/2021   IR CT HEAD LTD  02/15/2021   IR PERCUTANEOUS ART THROMBECTOMY/INFUSION INTRACRANIAL INC DIAG ANGIO  02/15/2021   IR US GUIDE VASC ACCESS RIGHT  02/15/2021   RADIOLOGY WITH ANESTHESIA N/A 02/14/2021   Procedure: IR WITH ANESTHESIA;  Surgeon: Luanne Bras, MD;  Location: [place];  Service: Radiology;  Laterality: N/A;   TEE WITHOUT CARDIOVERSION N/A 02/21/2021   Procedure: TRANSESOPHAGEAL ECHOCARDIOGRAM (TEE);  Surgeon: Skeet Latch, MD;  Location: [place];  Service: Cardiovascular;  Laterality: N/A;   Patient Active Problem List   Diagnosis Date Noted   Acquired subluxation of left shoulder 06/13/2021   Homonymous hemianopsia, left 06/13/2021   Sprain of left ankle    Sleep disturbance    Hyperglycemia    Hypoalbuminemia due to protein-calorie malnutrition ([place])    Dyslipidemia    Hemiparesis affecting left side as late effect of stroke ([place])     Lethargy    Cerebrovascular accident (CVA) due to occlusion of right posterior cerebral artery ([place]) 02/22/2021   Cerebrovascular accident (CVA) ([place])    Right carotid artery occlusion 02/14/2021    REFERRING DIAG: J19.147 (ICD-10-CM) - Hemiparesis affecting left side as late effect of stroke ([place])  THERAPY DIAG:  Muscle weakness (generalized)  Other abnormalities of gait and mobility  Unsteadiness on feet  Rationale for Evaluation and Treatment Rehabilitation  PERTINENT HISTORY: R PCA CVA, polysubstance abuse, migraine  PRECAUTIONS: Fall  SUBJECTIVE: Patient reports doing well- got blood work done today. Trying to walk as much as possible at home. No falls/near falls.   PAIN:  Are you having pain? No   OBJECTIVE:  TODAY'S TREATMENT:  NMR:  -hooklying bridges with B LE on compliant/unsteady surface 2x10  -hooklying U LE bridge -> HSC 2x10  -anterior lunge onto bosu ball B LE 2x8  -resisted modified D1 L LE pattern to 2nd step, R UE support -> full standing D1 L LE pattern to 2nd step, R UE support   PATIENT EDUCATION: Education details: use of Bosu ball at home with spouse assist/supervision with UE support  Person educated: Patient and Spouse Education method: Explanation Education comprehension: verbalized understanding     HOME EXERCISE PROGRAM: Access Code: [email or web address] URL: [email or web address] Date: 12/07/2021 Prepared by: Baldomero Lamy   Exercises - Seated Heel Slide  - 1 x  daily - 5 x weekly - 2 sets - 10 reps (Completed first set with towel under LLE x 10 reps, second set compelted with yellow theraband x  10 reps), cues for technique - Step Up  - 1 x daily - 5 x weekly - 2 sets - 10 reps (leading with LLE and focus on eccentric control with reduced UE support to promote LLE strengthening) - No Picture Provided as Unable to Find Proper Picture - Sit to Stands with 4" Block under RLE (cues for technique)  - Standing Ankle Dorsiflexion  Stretch  - 1 x daily - 7 x weekly - 3 sets - 10 reps - Long Sitting Calf Stretch with Strap  - 1 x daily - 7 x weekly - 3 sets - 10 reps   GOALS: Goals reviewed with patient? Yes   SHORT TERM GOALS: Target date: 12/26/2021   Pt will be independent with initial HEP for improved balance and gait mechanics  Baseline: provided Goal status: MET   2.  Pt will improve FGA to >/= 21/30 to demonstrate improved balance and reduced fall risk   Baseline: 16/30; 21/30 Goal status: MET   3.  Pt will improve gait speed to >/= .72ms to demonstrate improved community ambulation   Baseline: .319m; .385m Goal status: NOT MET   4.  L ankle DF ROM to be measured and LTG/STG to be added as appropriate Baseline: to be measured Goal status: INITIAL     LONG TERM GOALS: Target date: 01/23/2022   Pt will be independent with final HEP for improved balance and gait mechanics  Baseline: to be provided Goal status: INITIAL   2.  Pt will improve FGA to >/= 26/30 to demonstrate improved balance and reduced fall risk   Baseline: 16/30 Goal status: INITIAL   3.  Pt will improve gait speed to >/= .24m/92mo demonstrate improved community ambulation   Baseline: .71m/65mal status: INITIAL       ASSESSMENT:   CLINICAL IMPRESSION: Patient seen for skilled PT session with emphasis on L hemi NMR. Patient is able to move outside of tone patterns with increased effort and cuing. He tolerated NMR exercises well with noted compensatory strategies. When trying to advance L LE without circumducting, patient reverting to vaulting on R LE. L posterior pelvic rotation noted as well. Discussed Bosu exercises at home with patient including anterior lunge onto ball with R UE support and someone else providing close supervision. With R LE emphasis is on slow, controlled movements; with L E emphasis is on minimizing circumduction. Continue POC.   OBJECTIVE IMPAIRMENTS Abnormal gait, decreased activity tolerance,  decreased balance, decreased coordination, decreased endurance, decreased knowledge of condition, decreased knowledge of use of DME, decreased mobility, difficulty walking, decreased ROM, and decreased strength.    ACTIVITY LIMITATIONS carrying, lifting, bending, standing, squatting, stairs, transfers, bed mobility, locomotion level, and caring for others   PARTICIPATION LIMITATIONS: meal prep, cleaning, laundry, driving, shopping, community activity, occupation, and yard work   PERSONAL FACTORS Age, Fitness, Profession, Time since onset of injury/illness/exacerbation, and 1-2 comorbidities: polysubstance abuse, migraines  are also affecting patient's functional outcome.    REHAB POTENTIAL: Fair time since onset   CLINICAL DECISION MAKING: Stable/uncomplicated   EVALUATION COMPLEXITY: Low   PLAN: PT FREQUENCY: 2x/week   PT DURATION: 8 weeks   PLANNED INTERVENTIONS: Therapeutic exercises, Therapeutic activity, Neuromuscular re-education, Balance training, Gait training, Patient/Family education, Joint mobilization, Stair training, Vestibular training, Orthotic/Fit training, DME instructions, Aquatic Therapy, Electrical stimulation,  Cryotherapy, Moist heat, Splintting, Taping, Manual therapy, and Re-evaluation   PLAN FOR NEXT SESSION: continue use of heel lift. Functional LLE strengthening. Balance, ambulatory balance    Debbora Dus, PT, DPT Debbora Dus, PT, DPT, CBIS  12/27/2021, 3:29 PM

## 2021-12-31 ENCOUNTER — Ambulatory Visit: Payer: BC Managed Care – PPO

## 2022-01-02 ENCOUNTER — Encounter
Payer: BC Managed Care – PPO | Attending: Physical Medicine & Rehabilitation | Admitting: Physical Medicine & Rehabilitation

## 2022-01-02 ENCOUNTER — Encounter: Payer: Self-pay | Admitting: Physical Medicine & Rehabilitation

## 2022-01-02 VITALS — BP 119/79 | HR 85 | Ht 69.0 in | Wt 218.2 lb

## 2022-01-02 DIAGNOSIS — I69354 Hemiplegia and hemiparesis following cerebral infarction affecting left non-dominant side: Secondary | ICD-10-CM | POA: Diagnosis not present

## 2022-01-02 DIAGNOSIS — R252 Cramp and spasm: Secondary | ICD-10-CM | POA: Insufficient documentation

## 2022-01-02 DIAGNOSIS — H53462 Homonymous bilateral field defects, left side: Secondary | ICD-10-CM | POA: Insufficient documentation

## 2022-01-02 DIAGNOSIS — S43002S Unspecified subluxation of left shoulder joint, sequela: Secondary | ICD-10-CM | POA: Insufficient documentation

## 2022-01-02 MED ORDER — TIZANIDINE HCL 2 MG PO CAPS: 2.0000 mg | ORAL_CAPSULE | Freq: Three times a day (TID) | ORAL | 4 refills | Status: AC

## 2022-01-02 MED ORDER — QUETIAPINE FUMARATE 50 MG PO TABS: 50.0000 mg | ORAL_TABLET | ORAL | 2 refills | Status: DC | PRN

## 2022-01-02 NOTE — Progress Notes (Signed)
Subjective:    Patient ID: David Craig, male    DOB: [birth year], 36 y.o.   MRN: [id number]  HPI  Atthew is here in follow up of his CVA. He has been busy with PT, OT this summer and has been making gains with mobility and self-care.  He has been walking with his cane and left AFO.  He feels that his gait is improving.  He did not meet the criteria for the vivistim trial but is encouraged to still keep working.  He is not using his Seroquel routinely for sleep at night any longer and stop using tizanidine regularly.  I asked him about his smoking and he states he still smoking 2 cigarettes/day.  His appetite has decreased and he has lost about 30 pounds in general.  Mood has been fairly positive.  He still wants to get back to driving and asked how he goes about doing that.  Pain Inventory Average Pain 0 Pain Right Now 0 My pain is  no pain  LOCATION OF PAIN  no pain  BOWEL Number of stools per week: 7 Oral laxative use No  Type of laxative . Enema or suppository use No  History of colostomy No  Incontinent No   BLADDER Normal In and out cath, frequency . Able to self cath  . Bladder incontinence No  Frequent urination No  Leakage with coughing No  Difficulty starting stream No  Incomplete bladder emptying No    Mobility use a cane how many minutes can you walk? 45 ability to climb steps?  yes do you drive?  no  Function disabled: date disabled 9/22 I need assistance with the following:  dressing, bathing, meal prep, household duties, and shopping  Neuro/Psych No problems in this area  Prior Studies Any changes since last visit?  no  Physicians involved in your care Any changes since last visit?  no   No family history on file. Social History   Socioeconomic History   Marital status: Single    Spouse name: Not on file   Number of children: 2   Years of education: Not on file   Highest education level: Not on file  Occupational History   Not on file   Tobacco Use   Smoking status: Some Days    Types: Cigarettes    Last attempt to quit: 03/15/2015    Years since quitting: 6.8   Smokeless tobacco: Never  Vaping Use   Vaping Use: Former  Substance and Sexual Activity   Alcohol use: Yes    Comment: occasional   Drug use: Yes    Types: Cocaine, Marijuana, Oxycodone   Sexual activity: Yes    Partners: Female  Other Topics Concern   Not on file  Social History Narrative   Not on file   Social Determinants of Health   Financial Resource Strain: Not on file  Food Insecurity: Not on file  Transportation Needs: Not on file  Physical Activity: Not on file  Stress: Not on file  Social Connections: Not on file   Past Surgical History:  Procedure Laterality Date   BUBBLE STUDY  02/21/2021   Procedure: BUBBLE STUDY;  Surgeon: Chilton Si, MD;  Location: [facility] ENDOSCOPY;  Service: Cardiovascular;;   IR ANGIO INTRA EXTRACRAN SEL COM CAROTID INNOMINATE UNI L MOD SED  02/15/2021   IR ANGIO VERTEBRAL SEL VERTEBRAL BILAT MOD SED  02/15/2021   IR CT HEAD LTD  02/15/2021   IR PERCUTANEOUS ART THROMBECTOMY/INFUSION INTRACRANIAL INC DIAG  ANGIO  02/15/2021   IR US GUIDE VASC ACCESS RIGHT  02/15/2021   RADIOLOGY WITH ANESTHESIA N/A 02/14/2021   Procedure: IR WITH ANESTHESIA;  Surgeon: Julieanne Cotton, MD;  Location: [facility] OR;  Service: Radiology;  Laterality: N/A;   TEE WITHOUT CARDIOVERSION N/A 02/21/2021   Procedure: TRANSESOPHAGEAL ECHOCARDIOGRAM (TEE);  Surgeon: Chilton Si, MD;  Location: [facility] ENDOSCOPY;  Service: Cardiovascular;  Laterality: N/A;   No past medical history on file. BP 119/79   Pulse 85   Ht 5\' 9"  (1.753 m)   Wt 99 kg   SpO2 96%   BMI 32.22 kg/m   Opioid Risk Score:   Fall Risk Score:  `1  Depression screen [facility] 2/9     09/05/2021    1:37 PM 06/13/2021    1:44 PM 04/03/2021    1:18 PM  Depression screen PHQ 2/9  Decreased Interest 0 0 0  Down, Depressed, Hopeless 0 0 0  PHQ - 2 Score 0 0 0  Altered sleeping    1  Tired, decreased energy   0  Change in appetite   0  Feeling bad or failure about yourself    0  Trouble concentrating   0  Moving slowly or fidgety/restless   2  Suicidal thoughts   0  PHQ-9 Score   3     Review of Systems  All other systems reviewed and are negative.      Objective:   Physical Exam Constitutional: No distress . Vital signs reviewed. HEENT: NCAT, EOMI, oral membranes moist Neck: supple Cardiovascular: RRR without murmur. No JVD    Respiratory/Chest: CTA Bilaterally without wheezes or rales. Normal effort    GI/Abdomen: BS +, non-tender, non-distended Ext: no clubbing, cyanosis, or edema Psych: pleasant and cooperative  Neuro: Patient is alert and oriented x3.  Has ongoing left homonymous hemianopsia with recovery of lower 10-15% now.  Left upper extremity strength is 1+ to 2 proximal to 1+ distally. .  Minimal flexor tone clinically at the wrist and fingers at 1+ out of 4.  Left lower extremity is 3 out of 5 hip flexors and knee extensors as well as 2+ out of 5 plantar flexion and 0 to trace ankle dorsiflexion.    Sensation slightly diminished on the left side once again to pinprick and light touch.  Deep tendon reflexes are 3+.  swings left leg more effectively with better knee bend and heel strike. No circumducgtion today. Holds left arm near chest wall and doesn't utilize swing of arm to help with balance.  Musculoskeletal: mild subluxation of the left shoulder present with some pain and passive range of motion at the shoulder.        Medical Problem List and Plan:  1.  Left-sided hemiparesis secondary to right PCA infarction secondary to terminal right ICA occlusion status post failed mechanical thrombectomy etiology likely secondary to cocaine use/vasospasm             -continue HP therapy             -Still recommend cane for balance and mechanics              -ECASA             -Unable to drive given his left homonymous hemianopsia now with sparing in  lower left 10-15%.  We will continue to monitor as he would need substantially more recovery to drive an automobile .   -             -  gave him information re: vivistim 2.  Spasticity:             -Tizanidine 2mg  tid effective--encouraged to resume             -Continue splinting and range of motion              4. Mood/sleep-wake:                -Maintain Seroquel for sleep asneeded  -suspect some of weight loss is related to not using seroquel routinely 8.  Smoking cessation:             -discussed smoking cessation and smoking's impact on his stroke risk and life expectancy     15 total minutes were spent in examination of patient, assessment of pertinent data,  formulation of a treatment plan, and in discussion with patient and/or family.  F/U IN 4 mos

## 2022-01-02 NOTE — Patient Instructions (Signed)
PLEASE FEEL FREE TO CALL OUR OFFICE WITH ANY PROBLEMS OR QUESTIONS ([phone])      

## 2022-01-03 ENCOUNTER — Ambulatory Visit: Payer: BC Managed Care – PPO | Admitting: Occupational Therapy

## 2022-01-03 ENCOUNTER — Ambulatory Visit: Payer: BC Managed Care – PPO | Attending: Physical Medicine & Rehabilitation

## 2022-01-03 DIAGNOSIS — R2681 Unsteadiness on feet: Secondary | ICD-10-CM | POA: Diagnosis not present

## 2022-01-03 DIAGNOSIS — I69854 Hemiplegia and hemiparesis following other cerebrovascular disease affecting left non-dominant side: Secondary | ICD-10-CM

## 2022-01-03 DIAGNOSIS — R29818 Other symptoms and signs involving the nervous system: Secondary | ICD-10-CM | POA: Insufficient documentation

## 2022-01-03 DIAGNOSIS — M25512 Pain in left shoulder: Secondary | ICD-10-CM | POA: Diagnosis not present

## 2022-01-03 DIAGNOSIS — R278 Other lack of coordination: Secondary | ICD-10-CM

## 2022-01-03 DIAGNOSIS — R2689 Other abnormalities of gait and mobility: Secondary | ICD-10-CM | POA: Diagnosis not present

## 2022-01-03 DIAGNOSIS — R41840 Attention and concentration deficit: Secondary | ICD-10-CM | POA: Insufficient documentation

## 2022-01-03 DIAGNOSIS — M6281 Muscle weakness (generalized): Secondary | ICD-10-CM | POA: Insufficient documentation

## 2022-01-03 NOTE — Therapy (Signed)
OUTPATIENT PHYSICAL THERAPY TREATMENT NOTE   Patient Name: David Craig MRN: [id number] DOB:Apr 17, 1986, 36 y.o., male Today's Date: 01/03/2022  PCP: Maude Leriche, PA-C REFERRING PROVIDER: Alger Simons, MD  END OF SESSION:   PT End of Session - 01/03/22 1445     Visit Number 6    Number of Visits 17    Date for PT Re-Evaluation 01/23/22    Authorization Type BCBS (VL: 30 PT/OT) per year    PT Start Time 5176    PT Stop Time 1525    PT Time Calculation (min) 40 min    Equipment Utilized During Treatment Gait belt    Activity Tolerance Patient tolerated treatment well    Behavior During Therapy WFL for tasks assessed/performed              History reviewed. No pertinent past medical history. Past Surgical History:  Procedure Laterality Date   BUBBLE STUDY  02/21/2021   Procedure: BUBBLE STUDY;  Surgeon: Skeet Latch, MD;  Location: [place];  Service: Cardiovascular;;   IR ANGIO INTRA EXTRACRAN SEL COM CAROTID INNOMINATE UNI L MOD SED  02/15/2021   IR ANGIO VERTEBRAL SEL VERTEBRAL BILAT MOD SED  02/15/2021   IR CT HEAD LTD  02/15/2021   IR PERCUTANEOUS ART THROMBECTOMY/INFUSION INTRACRANIAL INC DIAG ANGIO  02/15/2021   IR US GUIDE VASC ACCESS RIGHT  02/15/2021   RADIOLOGY WITH ANESTHESIA N/A 02/14/2021   Procedure: IR WITH ANESTHESIA;  Surgeon: Luanne Bras, MD;  Location: [place];  Service: Radiology;  Laterality: N/A;   TEE WITHOUT CARDIOVERSION N/A 02/21/2021   Procedure: TRANSESOPHAGEAL ECHOCARDIOGRAM (TEE);  Surgeon: Skeet Latch, MD;  Location: [place];  Service: Cardiovascular;  Laterality: N/A;   Patient Active Problem List   Diagnosis Date Noted   Acquired subluxation of left shoulder 06/13/2021   Homonymous hemianopsia, left 06/13/2021   Sprain of left ankle    Sleep disturbance    Hyperglycemia    Hypoalbuminemia due to protein-calorie malnutrition ([place])    Dyslipidemia    Hemiparesis affecting left side as late effect of stroke ([place])     Lethargy    Cerebrovascular accident (CVA) due to occlusion of right posterior cerebral artery ([place]) 02/22/2021   Cerebrovascular accident (CVA) ([place])    Right carotid artery occlusion 02/14/2021    REFERRING DIAG: H60.737 (ICD-10-CM) - Hemiparesis affecting left side as late effect of stroke ([place])  THERAPY DIAG:  Muscle weakness (generalized)  Other abnormalities of gait and mobility  Unsteadiness on feet  Other lack of coordination  Rationale for Evaluation and Treatment Rehabilitation  PERTINENT HISTORY: R PCA CVA, polysubstance abuse, migraine  PRECAUTIONS: Fall  SUBJECTIVE: Patient reports doing well, saw neurologist and no changes. No falls or near falls.   PAIN:  Are you having pain? No   OBJECTIVE:  TODAY'S TREATMENT:  NMR:  -sit <>stand R LE biased + 5# dumbell shoulder press R UE  -modified sumo squat R LE on 4" box   Gait: -treadmill x10 mins 1.97mh flat incline  -treadmill lateral stepping x4 mins .346m 2% incline  PATIENT EDUCATION: Education details: appropriate gaAeronautical engineerPerson educated: Patient and Spouse Education method: Explanation Education comprehension: verbalized understanding     HOME EXERCISE PROGRAM: Access Code: [email or web address] URL: [email or web address] Date: 12/07/2021 Prepared by: KaBaldomero Lamy Exercises - Seated Heel Slide  - 1 x daily - 5 x weekly - 2 sets - 10 reps (Completed first set with towel under LLE x 10 reps, second set compelted with  yellow theraband x  10 reps), cues for technique - Step Up  - 1 x daily - 5 x weekly - 2 sets - 10 reps (leading with LLE and focus on eccentric control with reduced UE support to promote LLE strengthening) - No Picture Provided as Unable to Find Proper Picture - Sit to Stands with 4" Block under RLE (cues for technique)  - Standing Ankle Dorsiflexion Stretch  - 1 x daily - 7 x weekly - 3 sets - 10 reps - Long Sitting Calf Stretch with Strap  - 1 x daily - 7 x weekly - 3  sets - 10 reps   GOALS: Goals reviewed with patient? Yes   SHORT TERM GOALS: Target date: 12/26/2021   Pt will be independent with initial HEP for improved balance and gait mechanics  Baseline: provided Goal status: MET   2.  Pt will improve FGA to >/= 21/30 to demonstrate improved balance and reduced fall risk   Baseline: 16/30; 21/30 Goal status: MET   3.  Pt will improve gait speed to >/= .45ms to demonstrate improved community ambulation   Baseline: .350m; .38758m Goal status: NOT MET   4.  L ankle DF ROM to be measured and LTG/STG to be added as appropriate Baseline: to be measured Goal status: INITIAL     LONG TERM GOALS: Target date: 01/23/2022   Pt will be independent with final HEP for improved balance and gait mechanics  Baseline: to be provided Goal status: INITIAL   2.  Pt will improve FGA to >/= 26/30 to demonstrate improved balance and reduced fall risk   Baseline: 16/30 Goal status: INITIAL   3.  Pt will improve gait speed to >/= .58m/11mo demonstrate improved community ambulation   Baseline: .33m/57mal status: INITIAL       ASSESSMENT:   CLINICAL IMPRESSION: Patient seen for skilled PT session with emphasis on L LE NMR and gait retraining. Patient tolerating treadmill training well. Limited ability to dual task and maintain appropriate gait mechanics. Spastic gait remains with compensatory circumduction and ER + flat foot contact. Lateral stepping on treadmill with L LE trailing for emphasis on flexion + adduction. Patient with difficulty completing this due to tone patterns present. Continue POC.   OBJECTIVE IMPAIRMENTS Abnormal gait, decreased activity tolerance, decreased balance, decreased coordination, decreased endurance, decreased knowledge of condition, decreased knowledge of use of DME, decreased mobility, difficulty walking, decreased ROM, and decreased strength.    ACTIVITY LIMITATIONS carrying, lifting, bending, standing, squatting,  stairs, transfers, bed mobility, locomotion level, and caring for others   PARTICIPATION LIMITATIONS: meal prep, cleaning, laundry, driving, shopping, community activity, occupation, and yard work   PERSONAL FACTORS Age, Fitness, Profession, Time since onset of injury/illness/exacerbation, and 1-2 comorbidities: polysubstance abuse, migraines  are also affecting patient's functional outcome.    REHAB POTENTIAL: Fair time since onset   CLINICAL DECISION MAKING: Stable/uncomplicated   EVALUATION COMPLEXITY: Low   PLAN: PT FREQUENCY: 2x/week   PT DURATION: 8 weeks   PLANNED INTERVENTIONS: Therapeutic exercises, Therapeutic activity, Neuromuscular re-education, Balance training, Gait training, Patient/Family education, Joint mobilization, Stair training, Vestibular training, Orthotic/Fit training, DME instructions, Aquatic Therapy, Electrical stimulation, Cryotherapy, Moist heat, Splintting, Taping, Manual therapy, and Re-evaluation   PLAN FOR NEXT SESSION: continue use of heel lift. Functional LLE strengthening. Balance, ambulatory balance, treadmill tx    JenniDebbora Dus DPT JenniDebbora Dus DPT, CBIS  01/03/2022, 3:40 PM

## 2022-01-03 NOTE — Therapy (Signed)
OUTPATIENT OCCUPATIONAL THERAPY NEURO TREATMENT  Patient Name: David Craig MRN: [id number] DOB:07-Feb-1986, 36 y.o., male Today's Date: 01/03/2022  PCP: Clementeen Graham, PA-C REFERRING PROVIDER: Ranelle Oyster, MD    OT End of Session - 01/03/22 1516     Visit Number 4    Number of Visits 17    Date for OT Re-Evaluation 02/15/22    Authorization Type BCBS    Authorization Time Period VL: 30 PT/OT    OT Start Time 1532    OT Stop Time 1613    OT Time Calculation (min) 41 min    Activity Tolerance Patient tolerated treatment well    Behavior During Therapy [facility] for tasks assessed/performed              No past medical history on file. Past Surgical History:  Procedure Laterality Date   BUBBLE STUDY  02/21/2021   Procedure: BUBBLE STUDY;  Surgeon: Chilton Si, MD;  Location: [facility] ENDOSCOPY;  Service: Cardiovascular;;   IR ANGIO INTRA EXTRACRAN SEL COM CAROTID INNOMINATE UNI L MOD SED  02/15/2021   IR ANGIO VERTEBRAL SEL VERTEBRAL BILAT MOD SED  02/15/2021   IR CT HEAD LTD  02/15/2021   IR PERCUTANEOUS ART THROMBECTOMY/INFUSION INTRACRANIAL INC DIAG ANGIO  02/15/2021   IR US GUIDE VASC ACCESS RIGHT  02/15/2021   RADIOLOGY WITH ANESTHESIA N/A 02/14/2021   Procedure: IR WITH ANESTHESIA;  Surgeon: Julieanne Cotton, MD;  Location: [facility] OR;  Service: Radiology;  Laterality: N/A;   TEE WITHOUT CARDIOVERSION N/A 02/21/2021   Procedure: TRANSESOPHAGEAL ECHOCARDIOGRAM (TEE);  Surgeon: Chilton Si, MD;  Location[facility] ENDOSCOPY;  Service: Cardiovascular;  Laterality: N/A;   Patient Active Problem List   Diagnosis Date Noted   Acquired subluxation of left shoulder 06/13/2021   Homonymous hemianopsia, left 06/13/2021   Sprain of left ankle    Sleep disturbance    Hyperglycemia    Hypoalbuminemia due to protein-calorie malnutrition ([facility])    Dyslipidemia    Hemiparesis affecting left side as late effect of stroke ([facility])    Lethargy    Cerebrovascular accident (CVA) due to  occlusion of right posterior cerebral artery ([facility]) 02/22/2021   Cerebrovascular accident (CVA) ([facility])    Right carotid artery occlusion 02/14/2021     Rationale for Evaluation and Treatment Rehabilitation    ONSET DATE: Sept 2022, referral date 11/28/21  REFERRING DIAG: D78.242 (ICD-10-CM) - Cerebrovascular accident (CVA) due to occlusion of right posterior cerebral artery ([facility])   THERAPY DIAG:  Muscle weakness (generalized)  Unsteadiness on feet  Other lack of coordination  Hemiplegia and hemiparesis following other cerebrovascular disease affecting left non-dominant side ([facility])  Other symptoms and signs involving the nervous system  Attention and concentration deficit  Acute pain of left shoulder  SUBJECTIVE:   SUBJECTIVE STATEMENT: Pt reports difficulty with exercises at home Pt accompanied by: self, mom PERTINENT HISTORY: right PCA infarcts in 02/2021, polysubstance abuse/tobacco, migrane headaches and obesity   PRECAUTIONS: Fall, Impulsive, Lt Hemi   PATIENT GOALS Pt motivated for return to PLOF for LUE.  OBJECTIVE:        TODAY'S TREATMENT:  Sidelying on affected side with active and isolated supination/pronation, elbow flexion and extension with mod v.c to avoid overactivation  Gentle passive stretch to digits and thumb in extension, pt was cautioned regarding over stretch  Standing at tabletop rolling ball forwards for shoulder flexion then for shoulder abduction with bilateral UE's for weightbearing and gentle self ROM, min facilitation  NMES small muscle group, atrophy preset,  intensity 25 x 8 mins to wrist extensors, min-mod facilitation, tableslides while performing. No adverse reactions.  Pt demonstrates decreased spasticity following use of NMES.     HOME EXERCISE PROGRAM: 12/07/21 Encouraged to continue supine exercises from previous episode of care 12/13/21  HEP in sidelying for A/ROM and weightbearing in seated-       GOALS: Goals  reviewed with patient? No  SHORT TERM GOALS: Target date: 01/04/2022    Status:  1 Pt will be independent with HEP targeting LUE range of motion and NMR  Baseline:  Initial  2 Pt will demonstrate at least 10% active extension of LUE hand. Baseline: trace Initial  3 Pt will demonstrate active LUE movement to reach to low level surface for progression to reaching and obtaining objects  Baseline: trace activation Initial      LONG TERM GOALS: Target date: 02/15/2022    Status:  1 Pt will be independent with updated HEP.  Baseline:  Initial  2 Pt will be mod I with all basic ADLs.             Baseline: min A for bathing Initial  3 Pt will be independent with Aquatics HEP PRN Baseline:  Initial  4 Pt will demonstrate ability to grasp and release cylindrical object and/or 1 inch block with LUE. Baseline: able to grasp/trace release Initial    ASSESSMENT:  CLINICAL IMPRESSION: Pt continues to demonstrate over activation of LUE during active movement. Pt requires verbal cues. Pt responded well to NMES.  PERFORMANCE DEFICITS in functional skills including ADLs, IADLs, coordination, dexterity, tone, ROM, strength, flexibility, FMC, GMC, balance, decreased knowledge of use of DME, vision, and UE functional use, cognitive skills including attention, and psychosocial skills including coping strategies and environmental adaptation.   IMPAIRMENTS are limiting patient from ADLs, IADLs, and leisure.   COMORBIDITIES may have co-morbidities  that affects occupational performance. Patient will benefit from skilled OT to address above impairments and improve overall function.  MODIFICATION OR ASSISTANCE TO COMPLETE EVALUATION: Min-Moderate modification of tasks or assist with assess necessary to complete an evaluation.  OT OCCUPATIONAL PROFILE AND HISTORY: Detailed assessment: Review of records and additional review of physical, cognitive, psychosocial history related to current functional  performance.  CLINICAL DECISION MAKING: Moderate - several treatment options, min-mod task modification necessary  REHAB POTENTIAL: Good  EVALUATION COMPLEXITY: Moderate    PLAN: OT FREQUENCY: 1-2x/week (1x/week for land, may add another visit to accommodate aquatics if available for patient)  OT DURATION: 10 weeks  8 visits over 10 weeks d/t any scheduling conflicts and to conserve visits PRN for visit limit.  PLANNED INTERVENTIONS: self care/ADL training, therapeutic exercise, therapeutic activity, neuromuscular re-education, manual therapy, passive range of motion, functional mobility training, aquatic therapy, splinting, electrical stimulation, fluidotherapy, patient/family education, cognitive remediation/compensation, energy conservation, coping strategies training, and DME and/or AE instructions  RECOMMENDED OTHER SERVICES: pt currently has PT POC  CONSULTED AND AGREED WITH PLAN OF CARE: Patient  PLAN FOR NEXT SESSION: LUE NMR, consider estim, weightbearing  Tema Alire, OT 01/03/2022, 3:17 PM

## 2022-01-07 ENCOUNTER — Ambulatory Visit: Payer: BC Managed Care – PPO

## 2022-01-10 ENCOUNTER — Ambulatory Visit: Payer: BC Managed Care – PPO | Admitting: Occupational Therapy

## 2022-01-10 ENCOUNTER — Ambulatory Visit: Payer: BC Managed Care – PPO

## 2022-01-10 DIAGNOSIS — R278 Other lack of coordination: Secondary | ICD-10-CM

## 2022-01-10 DIAGNOSIS — R2681 Unsteadiness on feet: Secondary | ICD-10-CM | POA: Diagnosis not present

## 2022-01-10 DIAGNOSIS — M6281 Muscle weakness (generalized): Secondary | ICD-10-CM

## 2022-01-10 DIAGNOSIS — M25512 Pain in left shoulder: Secondary | ICD-10-CM

## 2022-01-10 DIAGNOSIS — I69854 Hemiplegia and hemiparesis following other cerebrovascular disease affecting left non-dominant side: Secondary | ICD-10-CM

## 2022-01-10 DIAGNOSIS — R29818 Other symptoms and signs involving the nervous system: Secondary | ICD-10-CM

## 2022-01-10 DIAGNOSIS — R41840 Attention and concentration deficit: Secondary | ICD-10-CM

## 2022-01-10 DIAGNOSIS — R2689 Other abnormalities of gait and mobility: Secondary | ICD-10-CM

## 2022-01-10 NOTE — Therapy (Signed)
OUTPATIENT OCCUPATIONAL THERAPY NEURO TREATMENT  Patient Name: David Craig MRN: [id number] DOB:1985/10/04, 36 y.o., male Today's Date: 01/10/2022  PCP: Maude Leriche, PA-C REFERRING PROVIDER: Meredith Staggers, MD    OT End of Session - 01/10/22 1527     Visit Number 5    Number of Visits 17    Date for OT Re-Evaluation 02/15/22    Authorization Type BCBS    Authorization Time Period VL: 30 PT/OT    Authorization - Visit Number 5    Authorization - Number of Visits 15    OT Start Time 6754    OT Stop Time 1529    OT Time Calculation (min) 42 min    Activity Tolerance Patient tolerated treatment well    Behavior During Therapy WFL for tasks assessed/performed               No past medical history on file. Past Surgical History:  Procedure Laterality Date   BUBBLE STUDY  02/21/2021   Procedure: BUBBLE STUDY;  Surgeon: Skeet Latch, MD;  Location: [facility];  Service: Cardiovascular;;   IR ANGIO INTRA EXTRACRAN SEL COM CAROTID INNOMINATE UNI L MOD SED  02/15/2021   IR ANGIO VERTEBRAL SEL VERTEBRAL BILAT MOD SED  02/15/2021   IR CT HEAD LTD  02/15/2021   IR PERCUTANEOUS ART THROMBECTOMY/INFUSION INTRACRANIAL INC DIAG ANGIO  02/15/2021   IR US GUIDE VASC ACCESS RIGHT  02/15/2021   RADIOLOGY WITH ANESTHESIA N/A 02/14/2021   Procedure: IR WITH ANESTHESIA;  Surgeon: Luanne Bras, MD;  Location: [facility];  Service: Radiology;  Laterality: N/A;   TEE WITHOUT CARDIOVERSION N/A 02/21/2021   Procedure: TRANSESOPHAGEAL ECHOCARDIOGRAM (TEE);  Surgeon: Skeet Latch, MD;  Location: [facility];  Service: Cardiovascular;  Laterality: N/A;   Patient Active Problem List   Diagnosis Date Noted   Acquired subluxation of left shoulder 06/13/2021   Homonymous hemianopsia, left 06/13/2021   Sprain of left ankle    Sleep disturbance    Hyperglycemia    Hypoalbuminemia due to protein-calorie malnutrition ([place])    Dyslipidemia    Hemiparesis affecting left side as late effect  of stroke ([place])    Lethargy    Cerebrovascular accident (CVA) due to occlusion of right posterior cerebral artery ([place]) 02/22/2021   Cerebrovascular accident (CVA) ([place])    Right carotid artery occlusion 02/14/2021     Rationale for Evaluation and Treatment Rehabilitation    ONSET DATE: Sept 2022, referral date 11/28/21  REFERRING DIAG: G92.010 (ICD-10-CM) - Cerebrovascular accident (CVA) due to occlusion of right posterior cerebral artery ([place])   THERAPY DIAG:  Muscle weakness (generalized)  Unsteadiness on feet  Other lack of coordination  Hemiplegia and hemiparesis following other cerebrovascular disease affecting left non-dominant side (HCC)  Other symptoms and signs involving the nervous system  Attention and concentration deficit  Acute pain of left shoulder  Other abnormalities of gait and mobility  SUBJECTIVE:   SUBJECTIVE STATEMENT: Pt reports difficulty with exercises at home Pt accompanied by: self, mom PERTINENT HISTORY: right PCA infarcts in 02/2021, polysubstance abuse/tobacco, migrane headaches and obesity   PRECAUTIONS: Fall, Impulsive, Lt Hemi   PATIENT GOALS Pt motivated for return to PLOF for LUE.  OBJECTIVE:        TODAY'S TREATMENT:  Sidelying on affected side with active and isolated supination/pronation, elbow flexion and extension with mod v.c to avoid overactivation  Therapist assessed and performed minor adjustments to pre-fab splint and adjusted splint strapping for improved fit, pt took a picture with his  phone.  Seated weightbearing through tilted stool, with LUE for low range shoulder flexion, min facilitation and v.c to avoid overactivation.  Weightbearing through edge of mat with LUE, mod facilitation/ v.c  NMES small muscle group, atrophy preset, intensity 25 x 7 mins to wrist extensors, min-mod facilitation,. No adverse reactions.  Pt demonstrates decreased spasticity following use of NMES.     HOME EXERCISE  PROGRAM: 12/07/21 Encouraged to continue supine exercises from previous episode of care 12/13/21  HEP in sidelying for A/ROM and weightbearing in seated-       GOALS: Goals reviewed with patient? No  SHORT TERM GOALS: Target date: 01/04/2022    Status:  1 Pt will be independent with HEP targeting LUE range of motion and NMR  Baseline:  MET  2 Pt will demonstrate at least 10% active extension of LUE hand. Baseline: trace ongoing  3 Pt will demonstrate active LUE movement to reach to low level surface for progression to reaching and obtaining objects  Baseline: trace activation ongoing      LONG TERM GOALS: Target date: 02/15/2022    Status:  1 Pt will be independent with updated HEP.  Baseline:  Initial  2 Pt will be mod I with all basic ADLs.             Baseline: min A for bathing Initial  3 Pt will be independent with Aquatics HEP PRN Baseline:  Initial  4 Pt will demonstrate ability to grasp and release cylindrical object and/or 1 inch block with LUE. Baseline: able to grasp/trace release Initial    ASSESSMENT:  CLINICAL IMPRESSION: Pt demonstrates improved understanding of avoiding over activition during activities.  PERFORMANCE DEFICITS in functional skills including ADLs, IADLs, coordination, dexterity, tone, ROM, strength, flexibility, FMC, GMC, balance, decreased knowledge of use of DME, vision, and UE functional use, cognitive skills including attention, and psychosocial skills including coping strategies and environmental adaptation.   IMPAIRMENTS are limiting patient from ADLs, IADLs, and leisure.   COMORBIDITIES may have co-morbidities  that affects occupational performance. Patient will benefit from skilled OT to address above impairments and improve overall function.  MODIFICATION OR ASSISTANCE TO COMPLETE EVALUATION: Min-Moderate modification of tasks or assist with assess necessary to complete an evaluation.  OT OCCUPATIONAL PROFILE AND HISTORY: Detailed  assessment: Review of records and additional review of physical, cognitive, psychosocial history related to current functional performance.  CLINICAL DECISION MAKING: Moderate - several treatment options, min-mod task modification necessary  REHAB POTENTIAL: Good  EVALUATION COMPLEXITY: Moderate    PLAN: OT FREQUENCY: 1-2x/week (1x/week for land, may add another visit to accommodate aquatics if available for patient)  OT DURATION: 10 weeks  8 visits over 10 weeks d/t any scheduling conflicts and to conserve visits PRN for visit limit.  PLANNED INTERVENTIONS: self care/ADL training, therapeutic exercise, therapeutic activity, neuromuscular re-education, manual therapy, passive range of motion, functional mobility training, aquatic therapy, splinting, electrical stimulation, fluidotherapy, patient/family education, cognitive remediation/compensation, energy conservation, coping strategies training, and DME and/or AE instructions  RECOMMENDED OTHER SERVICES: pt currently has PT POC  CONSULTED AND AGREED WITH PLAN OF CARE: Patient  PLAN FOR NEXT SESSION: LUE NMR, consider estim, weightbearing  Jaimee Corum, OT 01/10/2022, 3:32 PM

## 2022-01-10 NOTE — Therapy (Signed)
OUTPATIENT PHYSICAL THERAPY TREATMENT NOTE   Patient Name: David Craig MRN: [id number] DOB:[birth year], 36 y.o., male Today's Date: 01/10/2022  PCP: Maude Leriche, PA-C REFERRING PROVIDER: Alger Simons, MD  END OF SESSION:   PT End of Session - 01/10/22 1532     Visit Number 7    Number of Visits 17    Date for PT Re-Evaluation 01/23/22    Authorization Type BCBS (VL: 30 PT/OT) per year    PT Start Time 5852    PT Stop Time 1612    PT Time Calculation (min) 42 min    Equipment Utilized During Treatment Gait belt    Activity Tolerance Patient tolerated treatment well    Behavior During Therapy WFL for tasks assessed/performed              History reviewed. No pertinent past medical history. Past Surgical History:  Procedure Laterality Date   BUBBLE STUDY  02/21/2021   Procedure: BUBBLE STUDY;  Surgeon: Skeet Latch, MD;  Location: [facility];  Service: Cardiovascular;;   IR ANGIO INTRA EXTRACRAN SEL COM CAROTID INNOMINATE UNI L MOD SED  02/15/2021   IR ANGIO VERTEBRAL SEL VERTEBRAL BILAT MOD SED  02/15/2021   IR CT HEAD LTD  02/15/2021   IR PERCUTANEOUS ART THROMBECTOMY/INFUSION INTRACRANIAL INC DIAG ANGIO  02/15/2021   IR US GUIDE VASC ACCESS RIGHT  02/15/2021   RADIOLOGY WITH ANESTHESIA N/A 02/14/2021   Procedure: IR WITH ANESTHESIA;  Surgeon: Luanne Bras, MD;  Location: [facility];  Service: Radiology;  Laterality: N/A;   TEE WITHOUT CARDIOVERSION N/A 02/21/2021   Procedure: TRANSESOPHAGEAL ECHOCARDIOGRAM (TEE);  Surgeon: Skeet Latch, MD;  Location: [facility];  Service: Cardiovascular;  Laterality: N/A;   Patient Active Problem List   Diagnosis Date Noted   Acquired subluxation of left shoulder 06/13/2021   Homonymous hemianopsia, left 06/13/2021   Sprain of left ankle    Sleep disturbance    Hyperglycemia    Hypoalbuminemia due to protein-calorie malnutrition ([place])    Dyslipidemia    Hemiparesis affecting left side as late effect of stroke ([place])     Lethargy    Cerebrovascular accident (CVA) due to occlusion of right posterior cerebral artery ([place]) 02/22/2021   Cerebrovascular accident (CVA) ([place])    Right carotid artery occlusion 02/14/2021    REFERRING DIAG: D78.242 (ICD-10-CM) - Hemiparesis affecting left side as late effect of stroke ([place])  THERAPY DIAG:  Muscle weakness (generalized)  Unsteadiness on feet  Other abnormalities of gait and mobility  Hemiplegia and hemiparesis following other cerebrovascular disease affecting left non-dominant side (HCC)  Rationale for Evaluation and Treatment Rehabilitation  PERTINENT HISTORY: R PCA CVA, polysubstance abuse, migraine  PRECAUTIONS: Fall  SUBJECTIVE: No new changes/complaints. No falls. Neurology visit went well.   PAIN:  Are you having pain? No   OBJECTIVE:  TODAY'S TREATMENT:  NMR:  On Red Mat completed the following:  - Tall Kneeling Hip Hinges with no UE support, with use of black theraband for increased resistance completed x 15 reps.  - Tall Kneeling with use of single UE support on R side, completed alteranting hip/knee flexion to bring RLE forward x 10 reps to focus on improved weight shift onto L side. Then completed with LLE x 10 reps to work on improved strengthening. Intermittent assistance required with LLE to return to starting position due to hip extensor weakness.  - Completed quadruped over red therapy ball, with PT assisting at LUE for support, cues for proper alignment required. Then completed RLE  extensions to facilitate weight shift onto L side. Completed x 10 reps.   Gait: Completed gait training on treadmill x 6 minutes on flat incline at 1.3 mph, intermittent cues for alignment and facilitation to reduce compensatory circumduction and external rotation. Then completed on 6% incline at 1.2 mph x 5 minutes. Cues for improved hip/knee flexion. Mild recurvatum noted with incline, improvements and able to keep recurvatum from occurring with verbal  cues.    Self Care:  Patient requesting information and potential to get back into Aquatic PT to provide aquatic HEP that can complete outside of therapy services. PT worked to get two land visits switched to aquatic based upon patient schedule   PATIENT EDUCATION: Education details: Aquatic PT appt Person educated: Patient Education method: Explanation Education comprehension: verbalized understanding     HOME EXERCISE PROGRAM: Access Code: [email or web address] URL: [email or web address] Date: 12/07/2021 Prepared by: Baldomero Lamy   Exercises - Seated Heel Slide  - 1 x daily - 5 x weekly - 2 sets - 10 reps (Completed first set with towel under LLE x 10 reps, second set compelted with yellow theraband x  10 reps), cues for technique - Step Up  - 1 x daily - 5 x weekly - 2 sets - 10 reps (leading with LLE and focus on eccentric control with reduced UE support to promote LLE strengthening) - No Picture Provided as Unable to Find Proper Picture - Sit to Stands with 4" Block under RLE (cues for technique)  - Standing Ankle Dorsiflexion Stretch  - 1 x daily - 7 x weekly - 3 sets - 10 reps - Long Sitting Calf Stretch with Strap  - 1 x daily - 7 x weekly - 3 sets - 10 reps   GOALS: Goals reviewed with patient? Yes   SHORT TERM GOALS: Target date: 12/26/2021   Pt will be independent with initial HEP for improved balance and gait mechanics  Baseline: provided Goal status: MET   2.  Pt will improve FGA to >/= 21/30 to demonstrate improved balance and reduced fall risk   Baseline: 16/30; 21/30 Goal status: MET   3.  Pt will improve gait speed to >/= .28ms to demonstrate improved community ambulation   Baseline: .364m; .3853m Goal status: NOT MET   4.  L ankle DF ROM to be measured and LTG/STG to be added as appropriate Baseline: to be measured Goal status: INITIAL     LONG TERM GOALS: Target date: 01/23/2022   Pt will be independent with final HEP for improved balance and  gait mechanics  Baseline: to be provided Goal status: INITIAL   2.  Pt will improve FGA to >/= 26/30 to demonstrate improved balance and reduced fall risk   Baseline: 16/30 Goal status: INITIAL   3.  Pt will improve gait speed to >/= .50m/57mo demonstrate improved community ambulation   Baseline: .42m/12mal status: INITIAL       ASSESSMENT:   CLINICAL IMPRESSION: Today's skilled PT session focused on continued gait training on treadmill with continued focus on improved gait mechanics with progressive gait speed and incline. As well as continued NMR to help facilitate weight shift and reduce compensatory movements. Patient will participate in two PT aquatic appointments to establish aquatic HEP to complete outside of therapy. Will continue per POC.   OBJECTIVE IMPAIRMENTS Abnormal gait, decreased activity tolerance, decreased balance, decreased coordination, decreased endurance, decreased knowledge of condition, decreased knowledge of use of DME, decreased mobility, difficulty walking, decreased ROM, and  decreased strength.    ACTIVITY LIMITATIONS carrying, lifting, bending, standing, squatting, stairs, transfers, bed mobility, locomotion level, and caring for others   PARTICIPATION LIMITATIONS: meal prep, cleaning, laundry, driving, shopping, community activity, occupation, and yard work   PERSONAL FACTORS Age, Fitness, Profession, Time since onset of injury/illness/exacerbation, and 1-2 comorbidities: polysubstance abuse, migraines  are also affecting patient's functional outcome.    REHAB POTENTIAL: Fair time since onset   CLINICAL DECISION MAKING: Stable/uncomplicated   EVALUATION COMPLEXITY: Low   PLAN: PT FREQUENCY: 2x/week   PT DURATION: 8 weeks   PLANNED INTERVENTIONS: Therapeutic exercises, Therapeutic activity, Neuromuscular re-education, Balance training, Gait training, Patient/Family education, Joint mobilization, Stair training, Vestibular training, Orthotic/Fit  training, DME instructions, Aquatic Therapy, Electrical stimulation, Cryotherapy, Moist heat, Splintting, Taping, Manual therapy, and Re-evaluation   PLAN FOR NEXT SESSION: Please re-cert next week for 1x/week for 4 weeks (doing the cert early because he will be in the pool when cert is actually due). Two of these appts will be aquatic, and two land appts. Aquatics is already scheduled. Continue use of heel lift. Functional LLE strengthening. Balance, ambulatory balance, treadmill tx    Kirtland Bouchard, PT, DPT  01/10/2022, 5:47 PM

## 2022-01-14 ENCOUNTER — Ambulatory Visit: Payer: BC Managed Care – PPO

## 2022-01-17 ENCOUNTER — Ambulatory Visit: Payer: BC Managed Care – PPO | Admitting: Occupational Therapy

## 2022-01-17 ENCOUNTER — Ambulatory Visit: Payer: BC Managed Care – PPO

## 2022-01-17 DIAGNOSIS — R278 Other lack of coordination: Secondary | ICD-10-CM | POA: Diagnosis not present

## 2022-01-17 DIAGNOSIS — R2689 Other abnormalities of gait and mobility: Secondary | ICD-10-CM | POA: Diagnosis not present

## 2022-01-17 DIAGNOSIS — M6281 Muscle weakness (generalized): Secondary | ICD-10-CM

## 2022-01-17 DIAGNOSIS — M25512 Pain in left shoulder: Secondary | ICD-10-CM | POA: Diagnosis not present

## 2022-01-17 DIAGNOSIS — R2681 Unsteadiness on feet: Secondary | ICD-10-CM | POA: Diagnosis not present

## 2022-01-17 DIAGNOSIS — I69854 Hemiplegia and hemiparesis following other cerebrovascular disease affecting left non-dominant side: Secondary | ICD-10-CM

## 2022-01-17 DIAGNOSIS — R41840 Attention and concentration deficit: Secondary | ICD-10-CM | POA: Diagnosis not present

## 2022-01-17 DIAGNOSIS — R29818 Other symptoms and signs involving the nervous system: Secondary | ICD-10-CM | POA: Diagnosis not present

## 2022-01-17 NOTE — Therapy (Signed)
OUTPATIENT OCCUPATIONAL THERAPY NEURO TREATMENT  Patient Name: David Craig MRN: [id number] DOB:1985-07-02, 36 y.o., male Today's Date: 01/17/2022  PCP: Maude Leriche, PA-C REFERRING PROVIDER: Meredith Staggers, MD    OT End of Session - 01/17/22 1707     Visit Number 5    Number of Visits 17    Date for OT Re-Evaluation 02/15/22    Authorization Type BCBS    Authorization Time Period VL: 30 PT/OT    Authorization - Visit Number 6    Authorization - Number of Visits 15    OT Start Time 4081    OT Stop Time 1615    OT Time Calculation (min) 45 min                No past medical history on file. Past Surgical History:  Procedure Laterality Date   BUBBLE STUDY  02/21/2021   Procedure: BUBBLE STUDY;  Surgeon: Skeet Latch, MD;  Location: [facility];  Service: Cardiovascular;;   IR ANGIO INTRA EXTRACRAN SEL COM CAROTID INNOMINATE UNI L MOD SED  02/15/2021   IR ANGIO VERTEBRAL SEL VERTEBRAL BILAT MOD SED  02/15/2021   IR CT HEAD LTD  02/15/2021   IR PERCUTANEOUS ART THROMBECTOMY/INFUSION INTRACRANIAL INC DIAG ANGIO  02/15/2021   IR US GUIDE VASC ACCESS RIGHT  02/15/2021   RADIOLOGY WITH ANESTHESIA N/A 02/14/2021   Procedure: IR WITH ANESTHESIA;  Surgeon: Luanne Bras, MD;  Location: [facility];  Service: Radiology;  Laterality: N/A;   TEE WITHOUT CARDIOVERSION N/A 02/21/2021   Procedure: TRANSESOPHAGEAL ECHOCARDIOGRAM (TEE);  Surgeon: Skeet Latch, MD;  Location: [facility];  Service: Cardiovascular;  Laterality: N/A;   Patient Active Problem List   Diagnosis Date Noted   Acquired subluxation of left shoulder 06/13/2021   Homonymous hemianopsia, left 06/13/2021   Sprain of left ankle    Sleep disturbance    Hyperglycemia    Hypoalbuminemia due to protein-calorie malnutrition ([place])    Dyslipidemia    Hemiparesis affecting left side as late effect of stroke ([place])    Lethargy    Cerebrovascular accident (CVA) due to occlusion of right posterior cerebral  artery ([place]) 02/22/2021   Cerebrovascular accident (CVA) ([place])    Right carotid artery occlusion 02/14/2021     Rationale for Evaluation and Treatment Rehabilitation    ONSET DATE: Sept 2022, referral date 11/28/21  REFERRING DIAG: K48.185 (ICD-10-CM) - Cerebrovascular accident (CVA) due to occlusion of right posterior cerebral artery ([place])   THERAPY DIAG:  Muscle weakness (generalized)  Unsteadiness on feet  Other lack of coordination  Other abnormalities of gait and mobility  Hemiplegia and hemiparesis following other cerebrovascular disease affecting left non-dominant side (HCC)  Other symptoms and signs involving the nervous system  Attention and concentration deficit  SUBJECTIVE:   SUBJECTIVE STATEMENT: Pt reports doing exercises at home Pt accompanied by: self, mom PERTINENT HISTORY: right PCA infarcts in 02/2021, polysubstance abuse/tobacco, migrane headaches and obesity   PRECAUTIONS: Fall, Impulsive, Lt Hemi   PATIENT GOALS Pt motivated for return to PLOF for LUE.  OBJECTIVE:        TODAY'S TREATMENT:  Sidelying on affected side with active and isolated supination/pronation, elbow flexion and extension with mod v.c to avoid overactivation  Seated weightbearing through tilted stool, with LUE for low range shoulder flexion, min facilitation and v.c to avoid overactivation.  Weightbearing through edge of mat with LUE, then through chair back mod facilitation/ v.c  Attempted sidelying shoulder flexion with UE ranger however pt had over activation.  Seated  AA/ROM with cane  min-mod facilitation, therapist issued as HEP- pt demo. Understanding.  Wrist AA/ROM extension, min v.c / facilitation      HOME EXERCISE PROGRAM: 12/07/21 Encouraged to continue supine exercises from previous episode of care 12/13/21  HEP in sidelying for A/ROM and weightbearing in seated- 01/17/22 Seated  AA/ROM with cane         GOALS: Goals reviewed with patient?  No  SHORT TERM GOALS: Target date: 01/04/2022    Status:  1 Pt will be independent with HEP targeting LUE range of motion and NMR  Baseline:  MET  2 Pt will demonstrate at least 10% active extension of LUE hand. Baseline: trace ongoing  3 Pt will demonstrate active LUE movement to reach to low level surface for progression to reaching and obtaining objects  Baseline: trace activation ongoing      LONG TERM GOALS: Target date: 02/15/2022    Status:  1 Pt will be independent with updated HEP.  Baseline:  Initial  2 Pt will be mod I with all basic ADLs.             Baseline: min A for bathing Initial  3 Pt will be independent with Aquatics HEP PRN Baseline:  Deferred pt to work with PT in the pool  4 Pt will demonstrate ability to grasp and release cylindrical object and/or 1 inch block with LUE. Baseline: able to grasp/trace release Initial    ASSESSMENT:  CLINICAL IMPRESSION: Pt demonstrates improved understanding of avoiding over activation during activities. Pt demonstrates understanding of HEP's.  PERFORMANCE DEFICITS in functional skills including ADLs, IADLs, coordination, dexterity, tone, ROM, strength, flexibility, FMC, GMC, balance, decreased knowledge of use of DME, vision, and UE functional use, cognitive skills including attention, and psychosocial skills including coping strategies and environmental adaptation.   IMPAIRMENTS are limiting patient from ADLs, IADLs, and leisure.   COMORBIDITIES may have co-morbidities  that affects occupational performance. Patient will benefit from skilled OT to address above impairments and improve overall function.  MODIFICATION OR ASSISTANCE TO COMPLETE EVALUATION: Min-Moderate modification of tasks or assist with assess necessary to complete an evaluation.  OT OCCUPATIONAL PROFILE AND HISTORY: Detailed assessment: Review of records and additional review of physical, cognitive, psychosocial history related to current functional  performance.  CLINICAL DECISION MAKING: Moderate - several treatment options, min-mod task modification necessary  REHAB POTENTIAL: Good  EVALUATION COMPLEXITY: Moderate    PLAN: OT FREQUENCY: 1-2x/week (1x/week for land, may add another visit to accommodate aquatics if available for patient)  OT DURATION: 10 weeks  8 visits over 10 weeks d/t any scheduling conflicts and to conserve visits PRN for visit limit.  PLANNED INTERVENTIONS: self care/ADL training, therapeutic exercise, therapeutic activity, neuromuscular re-education, manual therapy, passive range of motion, functional mobility training, aquatic therapy, splinting, electrical stimulation, fluidotherapy, patient/family education, cognitive remediation/compensation, energy conservation, coping strategies training, and DME and/or AE instructions  RECOMMENDED OTHER SERVICES: pt currently has PT POC  CONSULTED AND AGREED WITH PLAN OF CARE: Patient  PLAN FOR NEXT SESSION: LUE NMR, consider estim, weightbearing  Deklyn Trachtenberg, OT 01/17/2022, 5:09 PM Theone Murdoch, OTR/L [phone] Phone: [phone] 5:26 PM 01/17/22

## 2022-01-17 NOTE — Patient Instructions (Signed)
SELF ASSISTED WITH OBJECT: Shoulder Flexion / Elbow Extension (Crutch)    Place one hand on crutch or other assistive device. Move arm forward, straighten elbow. Help with right hand _10__ reps per set, _2__ sets per day, __7_ days per week Lower crutch if shoulder range of motion is limited.  Copyright  VHI. All rights reserved.

## 2022-01-17 NOTE — Therapy (Signed)
OUTPATIENT PHYSICAL THERAPY TREATMENT NOTE   Patient Name: David Craig MRN: [id number] DOB:05-03-1986, 36 y.o., male Today's Date: 01/17/2022  PCP: Maude Leriche, PA-C REFERRING PROVIDER: Alger Simons, MD  END OF SESSION:   PT End of Session - 01/17/22 1448     Visit Number 8    Number of Visits 21   with re-cert   Date for PT Re-Evaluation 02/14/22    Authorization Type BCBS (VL: 30 PT/OT) per year    PT Start Time 1446    PT Stop Time 1527    PT Time Calculation (min) 41 min    Equipment Utilized During Treatment Gait belt    Activity Tolerance Patient tolerated treatment well    Behavior During Therapy WFL for tasks assessed/performed              History reviewed. No pertinent past medical history. Past Surgical History:  Procedure Laterality Date   BUBBLE STUDY  02/21/2021   Procedure: BUBBLE STUDY;  Surgeon: Skeet Latch, MD;  Location: [place];  Service: Cardiovascular;;   IR ANGIO INTRA EXTRACRAN SEL COM CAROTID INNOMINATE UNI L MOD SED  02/15/2021   IR ANGIO VERTEBRAL SEL VERTEBRAL BILAT MOD SED  02/15/2021   IR CT HEAD LTD  02/15/2021   IR PERCUTANEOUS ART THROMBECTOMY/INFUSION INTRACRANIAL INC DIAG ANGIO  02/15/2021   IR US GUIDE VASC ACCESS RIGHT  02/15/2021   RADIOLOGY WITH ANESTHESIA N/A 02/14/2021   Procedure: IR WITH ANESTHESIA;  Surgeon: Luanne Bras, MD;  Location: [facility];  Service: Radiology;  Laterality: N/A;   TEE WITHOUT CARDIOVERSION N/A 02/21/2021   Procedure: TRANSESOPHAGEAL ECHOCARDIOGRAM (TEE);  Surgeon: Skeet Latch, MD;  Location: [facility];  Service: Cardiovascular;  Laterality: N/A;   Patient Active Problem List   Diagnosis Date Noted   Acquired subluxation of left shoulder 06/13/2021   Homonymous hemianopsia, left 06/13/2021   Sprain of left ankle    Sleep disturbance    Hyperglycemia    Hypoalbuminemia due to protein-calorie malnutrition ([place])    Dyslipidemia    Hemiparesis affecting left side as late effect  of stroke ([place])    Lethargy    Cerebrovascular accident (CVA) due to occlusion of right posterior cerebral artery ([place]) 02/22/2021   Cerebrovascular accident (CVA) ([place])    Right carotid artery occlusion 02/14/2021    REFERRING DIAG: A26.333 (ICD-10-CM) - Hemiparesis affecting left side as late effect of stroke ([place])  THERAPY DIAG:  Muscle weakness (generalized)  Unsteadiness on feet  Other lack of coordination  Other abnormalities of gait and mobility  Rationale for Evaluation and Treatment Rehabilitation  PERTINENT HISTORY: R PCA CVA, polysubstance abuse, migraine  PRECAUTIONS: Fall  SUBJECTIVE: Patient reports doing well. Did "pass out" last Friday night. States he took a sleeping pill and his cousin had to catch him. Denies injury or hitting his head. States that he has a DPT friend taking him to the gym tomorrow to show him how to use certain pieces of equipment.   PAIN:  Are you having pain? No   OBJECTIVE:  TODAY'S TREATMENT:  NMR:  [facility] PT Assessment - 01/17/22 0001       Standardized Balance Assessment   Standardized Balance Assessment 10 meter walk test    10 Meter Walk 1.53ms      Functional Gait  Assessment   Gait assessed  Yes    Gait Level Surface Walks 20 ft in less than 5.5 sec, no assistive devices, good speed, no evidence for imbalance, normal gait pattern, deviates  no more than 6 in outside of the 12 in walkway width.    Change in Gait Speed Able to smoothly change walking speed without loss of balance or gait deviation. Deviate no more than 6 in outside of the 12 in walkway width.    Gait with Horizontal Head Turns Performs head turns smoothly with slight change in gait velocity (eg, minor disruption to smooth gait path), deviates 6-10 in outside 12 in walkway width, or uses an assistive device.    Gait with Vertical Head Turns Performs task with slight change in gait velocity (eg, minor disruption to smooth gait path), deviates 6 - 10 in outside 12  in walkway width or uses assistive device    Gait and Pivot Turn Pivot turns safely within 3 sec and stops quickly with no loss of balance.    Step Over Obstacle Is able to step over one shoe box (4.5 in total height) without changing gait speed. No evidence of imbalance.    Gait with Narrow Base of Support Is able to ambulate for 10 steps heel to toe with no staggering.    Gait with Eyes Closed Walks 20 ft, no assistive devices, good speed, no evidence of imbalance, normal gait pattern, deviates no more than 6 in outside 12 in walkway width. Ambulates 20 ft in less than 7 sec.    Ambulating Backwards Walks 20 ft, uses assistive device, slower speed, mild gait deviations, deviates 6-10 in outside 12 in walkway width.    Steps Alternating feet, must use rail.    Total Score 25           [place] x10 mins level 3.5 with emphasis on correct L knee alignment -> carryover to overground gait with no AD and improved knee control in stance   PATIENT EDUCATION: Education details: PT POC, OM results Person educated: Patient Education method: Explanation Education comprehension: verbalized understanding     HOME EXERCISE PROGRAM: Access Code: [email or web address] URL: [email or web address] Date: 12/07/2021 Prepared by: Baldomero Lamy   Exercises - Seated Heel Slide  - 1 x daily - 5 x weekly - 2 sets - 10 reps (Completed first set with towel under LLE x 10 reps, second set compelted with yellow theraband x  10 reps), cues for technique - Step Up  - 1 x daily - 5 x weekly - 2 sets - 10 reps (leading with LLE and focus on eccentric control with reduced UE support to promote LLE strengthening) - No Picture Provided as Unable to Find Proper Picture - Sit to Stands with 4" Block under RLE (cues for technique)  - Standing Ankle Dorsiflexion Stretch  - 1 x daily - 7 x weekly - 3 sets - 10 reps - Long Sitting Calf Stretch with Strap  - 1 x daily - 7 x weekly - 3 sets - 10 reps   GOALS: Goals  reviewed with patient? Yes   SHORT TERM GOALS: Target date: 12/26/2021   Pt will be independent with initial HEP for improved balance and gait mechanics  Baseline: provided Goal status: MET   2.  Pt will improve FGA to >/= 21/30 to demonstrate improved balance and reduced fall risk   Baseline: 16/30; 21/30 Goal status: MET   3.  Pt will improve gait speed to >/= .15ms to demonstrate improved community ambulation   Baseline: .350m; .3829m Goal status: NOT MET   4.  L ankle DF ROM to be measured and LTG/STG to be added as appropriate Baseline:  to be measured Goal status: MET     LONG TERM GOALS: Target date: 01/23/2022   Pt will be independent with final HEP for improved balance and gait mechanics  Baseline: Provided Goal status: MET   2.  Pt will improve FGA to >/= 26/30 to demonstrate improved balance and reduced fall risk   Baseline: 16/30; 25/30 Goal status: NOT MET   3.  Pt will improve gait speed to >/= .70ms to demonstrate improved community ambulation   Baseline: .326m, 1.40m40mGoal status: MET  NEW LONG TERM GOALS : target date: 02/14/22 1.  Pt will improve FGA to >/= 26/30 to demonstrate improved balance and reduced fall risk Baseline: 16/30; 25/30 Goal status: NOT MET  2. Patient will be independent with aquatic HEP   Baseline: to be provided   Goal Status: NEW   ASSESSMENT:   CLINICAL IMPRESSION: Patient seen for skilled PT session with emphasis on goal assessment and L LE NMR. Patient scored a 25/30 on  Functional Gait Assessment.   <22/30 = predictive of falls, <20/30 = fall in 6 months, <18/30 = predictive of falls in PD MCID: 5 points stroke population, 4 points geriatric population (ANPTA Core Set of Outcome Measures for Adults with Neurologic Conditions, 2018). 10 Meter Walk Test: Patient instructed to walk 10 meters (32.8 ft) as quickly and as safely as possible at their normal speed x2 and at a fast speed x2. Time measured from 2 meter mark to 8  meter mark to accommodate ramp-up and ramp-down.  Normal speed: 1.55m75mut off scores: <0.4 m/s = household Ambulator, 0.4-0.8 m/s = limited community Ambulator, >0.8 m/s = community Ambulator, >1.2 m/s = crossing a street, <1.0 = increased fall risk MCID 0.05 m/s (small), 0.13 m/s (moderate), 0.06 m/s (significant)  (ANPTA Core Set of Outcome Measures for Adults with Neurologic Conditions, 2018). He was able to ambulate 115'5 with no AD + close supervision with consistent verbal cues for increased L knee flexion through swing phase. He does still demonstrate a spastic gait pattern, but decreased L genu recurvatum noted in stance. Continue POC.    OBJECTIVE IMPAIRMENTS Abnormal gait, decreased activity tolerance, decreased balance, decreased coordination, decreased endurance, decreased knowledge of condition, decreased knowledge of use of DME, decreased mobility, difficulty walking, decreased ROM, and decreased strength.    ACTIVITY LIMITATIONS carrying, lifting, bending, standing, squatting, stairs, transfers, bed mobility, locomotion level, and caring for others   PARTICIPATION LIMITATIONS: meal prep, cleaning, laundry, driving, shopping, community activity, occupation, and yard work   PERSONAL FACTORS Age, Fitness, Profession, Time since onset of injury/illness/exacerbation, and 1-2 comorbidities: polysubstance abuse, migraines  are also affecting patient's functional outcome.    REHAB POTENTIAL: Fair time since onset   CLINICAL DECISION MAKING: Stable/uncomplicated   EVALUATION COMPLEXITY: Low   PLAN: PT FREQUENCY: 2x/week + 1x/wk (re-cert)   PT DURATION: 8 weeks + 4 weeks (re-cert)   PLANNED INTERVENTIONS: Therapeutic exercises, Therapeutic activity, Neuromuscular re-education, Balance training, Gait training, Patient/Family education, Joint mobilization, Stair training, Vestibular training, Orthotic/Fit training, DME instructions, Aquatic Therapy, Electrical stimulation,  Cryotherapy, Moist heat, Splintting, Taping, Manual therapy, and Re-evaluation   PLAN FOR NEXT SESSION:  Continue use of heel lift. Functional LLE strengthening. Balance, ambulatory balance, treadmill tx    JennDebbora Dus, DPT JennDebbora Dus, DPT, CBIS   01/17/2022, 3:39 PM

## 2022-01-21 ENCOUNTER — Encounter: Payer: Self-pay | Admitting: Physical Therapy

## 2022-01-21 ENCOUNTER — Ambulatory Visit: Payer: BC Managed Care – PPO

## 2022-01-21 ENCOUNTER — Ambulatory Visit: Payer: BC Managed Care – PPO | Admitting: Physical Therapy

## 2022-01-21 DIAGNOSIS — M6281 Muscle weakness (generalized): Secondary | ICD-10-CM

## 2022-01-21 DIAGNOSIS — M25512 Pain in left shoulder: Secondary | ICD-10-CM | POA: Diagnosis not present

## 2022-01-21 DIAGNOSIS — R2689 Other abnormalities of gait and mobility: Secondary | ICD-10-CM

## 2022-01-21 DIAGNOSIS — I69854 Hemiplegia and hemiparesis following other cerebrovascular disease affecting left non-dominant side: Secondary | ICD-10-CM

## 2022-01-21 DIAGNOSIS — R41840 Attention and concentration deficit: Secondary | ICD-10-CM | POA: Diagnosis not present

## 2022-01-21 DIAGNOSIS — R29818 Other symptoms and signs involving the nervous system: Secondary | ICD-10-CM | POA: Diagnosis not present

## 2022-01-21 DIAGNOSIS — R2681 Unsteadiness on feet: Secondary | ICD-10-CM

## 2022-01-21 DIAGNOSIS — R278 Other lack of coordination: Secondary | ICD-10-CM | POA: Diagnosis not present

## 2022-01-21 NOTE — Therapy (Addendum)
OUTPATIENT PHYSICAL THERAPY TREATMENT NOTE   Patient Name: David Craig MRN: [id number] DOB:12-Dec-1985, 36 y.o., male Today's Date: 01/21/2022  PCP: Maude Leriche, PA-C REFERRING PROVIDER: Alger Simons, MD  END OF SESSION:   PT End of Session - 01/21/22 1353     Visit Number 9    Number of Visits 21   with re-cert   Date for PT Re-Evaluation 02/14/22    Authorization Type BCBS (VL: 30 PT/OT) per year    PT Start Time 0712    PT Stop Time 1228    PT Time Calculation (min) 43 min    Equipment Utilized During Treatment Other (comment)   pool noodles   Activity Tolerance Patient tolerated treatment well    Behavior During Therapy [facility] for tasks assessed/performed              History reviewed. No pertinent past medical history. Past Surgical History:  Procedure Laterality Date   BUBBLE STUDY  02/21/2021   Procedure: BUBBLE STUDY;  Surgeon: Skeet Latch, MD;  Location: [place];  Service: Cardiovascular;;   IR ANGIO INTRA EXTRACRAN SEL COM CAROTID INNOMINATE UNI L MOD SED  02/15/2021   IR ANGIO VERTEBRAL SEL VERTEBRAL BILAT MOD SED  02/15/2021   IR CT HEAD LTD  02/15/2021   IR PERCUTANEOUS ART THROMBECTOMY/INFUSION INTRACRANIAL INC DIAG ANGIO  02/15/2021   IR US GUIDE VASC ACCESS RIGHT  02/15/2021   RADIOLOGY WITH ANESTHESIA N/A 02/14/2021   Procedure: IR WITH ANESTHESIA;  Surgeon: Luanne Bras, MD;  Location: [place];  Service: Radiology;  Laterality: N/A;   TEE WITHOUT CARDIOVERSION N/A 02/21/2021   Procedure: TRANSESOPHAGEAL ECHOCARDIOGRAM (TEE);  Surgeon: Skeet Latch, MD;  Location: [place];  Service: Cardiovascular;  Laterality: N/A;   Patient Active Problem List   Diagnosis Date Noted   Acquired subluxation of left shoulder 06/13/2021   Homonymous hemianopsia, left 06/13/2021   Sprain of left ankle    Sleep disturbance    Hyperglycemia    Hypoalbuminemia due to protein-calorie malnutrition ([place])    Dyslipidemia    Hemiparesis affecting left  side as late effect of stroke ([place])    Lethargy    Cerebrovascular accident (CVA) due to occlusion of right posterior cerebral artery ([place]) 02/22/2021   Cerebrovascular accident (CVA) ([place])    Right carotid artery occlusion 02/14/2021    REFERRING DIAG: R97.588 (ICD-10-CM) - Hemiparesis affecting left side as late effect of stroke ([place])  THERAPY DIAG:  Muscle weakness (generalized)  Unsteadiness on feet  Other abnormalities of gait and mobility  Hemiplegia and hemiparesis following other cerebrovascular disease affecting left non-dominant side ([facility])  Rationale for Evaluation and Treatment Rehabilitation  PERTINENT HISTORY: R PCA CVA, polysubstance abuse, migraine  PRECAUTIONS: Fall  SUBJECTIVE: Patient presents for 1st PT aquatic session to   PAIN:  Are you having pain? No    TODAY'S TREATMENT: Aquatic therapy at [facility] - pool temp 90 degrees  Patient seen for aquatic therapy today.  Treatment took place in water 3.5-4.5 feet deep depending upon activity.  Pt entered/exited pool via stairs with single rail, supervision for safety.   Use of yellow noodle for gait into deeper part of pool, ~4.3-4.5 foot water depth Forward for 18 feet x 8 laps Backward for 18 feet x 8 laps Side stepping left<>right x 4 laps each way     At wall of pool in ~4.3 foot water depth with bil aquatic cuffs on. Performed the following for 20 reps each with cues on form/technique. Heel<>toe raises  Alternating marching Alternating HS curls with assist on left knee flexion at times Alternating hip abd/add Alternating hip extension     Strattled over yellow noodle with second pool noodle under arms across chest working on bicycling around deeper end of pool x 5 mintues      With 2 yellow noodles across chest under arms working on the following LE kicks for 18 feet x 6 laps each: scissor, then butterfly. Up to min assist to steer/turn at times       Pt requires buoyancy of water for  support for reduced fall risk with gait training and balance exercises with minimal UE support; exercises able to be performed safely in water without the risk of fall compared to those same exercises performed on land;  viscosity of water needed for resistance for strengthening.  Current of water provides perturbations for challenging static & dynamic standing balance.        PATIENT EDUCATION: Education details: continue with current land HEP and aquatic ex's to do at home Person educated: Patient Education method: Explanation Education comprehension: verbalized understanding     HOME EXERCISE PROGRAM: Access Code: [email or web address] URL: [email or web address] Date: 12/07/2021 Prepared by: Baldomero Lamy   Exercises - Seated Heel Slide  - 1 x daily - 5 x weekly - 2 sets - 10 reps (Completed first set with towel under LLE x 10 reps, second set compelted with yellow theraband x  10 reps), cues for technique - Step Up  - 1 x daily - 5 x weekly - 2 sets - 10 reps (leading with LLE and focus on eccentric control with reduced UE support to promote LLE strengthening) - No Picture Provided as Unable to Find Proper Picture - Sit to Stands with 4" Block under RLE (cues for technique)  - Standing Ankle Dorsiflexion Stretch  - 1 x daily - 7 x weekly - 3 sets - 10 reps - Long Sitting Calf Stretch with Strap  - 1 x daily - 7 x weekly - 3 sets - 10 reps   GOALS: Goals reviewed with patient? Yes   NEW LONG TERM GOALS : target date: 02/14/22 1.  Pt will improve FGA to >/= 26/30 to demonstrate improved balance and reduced fall risk Baseline: 16/30; 25/30 Goal status: NOT MET  2. Patient will be independent with aquatic HEP   Baseline: to be provided   Goal Status: NEW   ASSESSMENT:   CLINICAL IMPRESSION: Patient seen for skilled PT session in the aquatic setting for 1st time to go over gait and ex's pt can perform on own in community pool. No issues noted or reported in session.     OBJECTIVE IMPAIRMENTS Abnormal gait, decreased activity tolerance, decreased balance, decreased coordination, decreased endurance, decreased knowledge of condition, decreased knowledge of use of DME, decreased mobility, difficulty walking, decreased ROM, and decreased strength.    ACTIVITY LIMITATIONS carrying, lifting, bending, standing, squatting, stairs, transfers, bed mobility, locomotion level, and caring for others   PARTICIPATION LIMITATIONS: meal prep, cleaning, laundry, driving, shopping, community activity, occupation, and yard work   PERSONAL FACTORS Age, Fitness, Profession, Time since onset of injury/illness/exacerbation, and 1-2 comorbidities: polysubstance abuse, migraines  are also affecting patient's functional outcome.    REHAB POTENTIAL: Fair time since onset   CLINICAL DECISION MAKING: Stable/uncomplicated   EVALUATION COMPLEXITY: Low   PLAN: PT FREQUENCY: 2x/week + 1x/wk (re-cert)   PT DURATION: 8 weeks + 4 weeks (re-cert)   PLANNED INTERVENTIONS: Therapeutic exercises, Therapeutic activity, Neuromuscular re-education, Balance training,  Gait training, Patient/Family education, Joint mobilization, Stair training, Vestibular training, Orthotic/Fit training, DME instructions, Aquatic Therapy, Electrical stimulation, Cryotherapy, Moist heat, Splintting, Taping, Manual therapy, and Re-evaluation   PLAN FOR NEXT SESSION:   Continue use of heel lift. Functional LLE strengthening. Balance, ambulatory balance, treadmill tx    Willow Ora, PTA, [facility] [address] [place], [place] [address] [phone] 01/21/22, 1:54 PM

## 2022-01-23 ENCOUNTER — Encounter: Payer: Self-pay | Admitting: Physical Medicine & Rehabilitation

## 2022-01-24 ENCOUNTER — Ambulatory Visit: Payer: BC Managed Care – PPO

## 2022-01-24 ENCOUNTER — Ambulatory Visit: Payer: BC Managed Care – PPO | Admitting: Occupational Therapy

## 2022-01-24 DIAGNOSIS — R29818 Other symptoms and signs involving the nervous system: Secondary | ICD-10-CM | POA: Diagnosis not present

## 2022-01-24 DIAGNOSIS — R2689 Other abnormalities of gait and mobility: Secondary | ICD-10-CM

## 2022-01-24 DIAGNOSIS — R41840 Attention and concentration deficit: Secondary | ICD-10-CM

## 2022-01-24 DIAGNOSIS — I69854 Hemiplegia and hemiparesis following other cerebrovascular disease affecting left non-dominant side: Secondary | ICD-10-CM

## 2022-01-24 DIAGNOSIS — M25512 Pain in left shoulder: Secondary | ICD-10-CM | POA: Diagnosis not present

## 2022-01-24 DIAGNOSIS — R2681 Unsteadiness on feet: Secondary | ICD-10-CM | POA: Diagnosis not present

## 2022-01-24 DIAGNOSIS — R278 Other lack of coordination: Secondary | ICD-10-CM

## 2022-01-24 DIAGNOSIS — M6281 Muscle weakness (generalized): Secondary | ICD-10-CM | POA: Diagnosis not present

## 2022-01-25 NOTE — Therapy (Signed)
OUTPATIENT OCCUPATIONAL THERAPY NEURO TREATMENT  Patient Name: David Craig MRN: [id number] DOB:01-02-1986, 36 y.o., male Today's Date: 01/25/2022  PCP: Maude Leriche, PA-C REFERRING PROVIDER: Meredith Staggers, MD    OT End of Session - 01/24/22 1538     Visit Number 7    Number of Visits 17    Date for OT Re-Evaluation 02/15/22    Authorization Type BCBS    Authorization Time Period VL: 30 PT/OT    Authorization - Visit Number 7    Authorization - Number of Visits 15                No past medical history on file. Past Surgical History:  Procedure Laterality Date   BUBBLE STUDY  02/21/2021   Procedure: BUBBLE STUDY;  Surgeon: Skeet Latch, MD;  Location: [place];  Service: Cardiovascular;;   IR ANGIO INTRA EXTRACRAN SEL COM CAROTID INNOMINATE UNI L MOD SED  02/15/2021   IR ANGIO VERTEBRAL SEL VERTEBRAL BILAT MOD SED  02/15/2021   IR CT HEAD LTD  02/15/2021   IR PERCUTANEOUS ART THROMBECTOMY/INFUSION INTRACRANIAL INC DIAG ANGIO  02/15/2021   IR US GUIDE VASC ACCESS RIGHT  02/15/2021   RADIOLOGY WITH ANESTHESIA N/A 02/14/2021   Procedure: IR WITH ANESTHESIA;  Surgeon: Luanne Bras, MD;  Location: [place];  Service: Radiology;  Laterality: N/A;   TEE WITHOUT CARDIOVERSION N/A 02/21/2021   Procedure: TRANSESOPHAGEAL ECHOCARDIOGRAM (TEE);  Surgeon: Skeet Latch, MD;  Location: [place];  Service: Cardiovascular;  Laterality: N/A;   Patient Active Problem List   Diagnosis Date Noted   Acquired subluxation of left shoulder 06/13/2021   Homonymous hemianopsia, left 06/13/2021   Sprain of left ankle    Sleep disturbance    Hyperglycemia    Hypoalbuminemia due to protein-calorie malnutrition (HCC)    Dyslipidemia    Hemiparesis affecting left side as late effect of stroke ([place])    Lethargy    Cerebrovascular accident (CVA) due to occlusion of right posterior cerebral artery ([place]) 02/22/2021   Cerebrovascular accident (CVA) ([place])    Right carotid  artery occlusion 02/14/2021     Rationale for Evaluation and Treatment Rehabilitation    ONSET DATE: Sept 2022, referral date 11/28/21  REFERRING DIAG: H60.737 (ICD-10-CM) - Cerebrovascular accident (CVA) due to occlusion of right posterior cerebral artery (HCC)   THERAPY DIAG:  Unsteadiness on feet  Other abnormalities of gait and mobility  Hemiplegia and hemiparesis following other cerebrovascular disease affecting left non-dominant side (HCC)  Other lack of coordination  Other symptoms and signs involving the nervous system  Attention and concentration deficit  Muscle weakness (generalized)  SUBJECTIVE:   SUBJECTIVE STATEMENT: Pt reports doing exercises at home Pt accompanied by: self, mom PERTINENT HISTORY: right PCA infarcts in 02/2021, polysubstance abuse/tobacco, migrane headaches and obesity   PRECAUTIONS: Fall, Impulsive, Lt Hemi   PATIENT GOALS Pt motivated for return to PLOF for LUE.  OBJECTIVE:        TODAY'S TREATMENT:   Weightbearing through edge of mat with LUE, then through chair back mod facilitation/ v.c Sidelying on affected side with active and isolated supination/pronation, elbow flexion and extension with mod v.c to avoid overactivation Supine, P/ROM shoulder flexion followed by place and hold activation for AA/ROM shoulder flexion with shoulder at 90*, min v.c for overactivation. Seated  AA/ROM with cane  min facilitation  Discussion with pt about avoiding use of weights for LUE at the gym as this will place him at risk for injury due to LUE  weakness and limited shoulder stability. Discussed plans to d/c from OT next visit in order to conserve visits for the remainder of the year, and because progress has plateaued at this time      [place]: 12/07/21 Encouraged to continue supine exercises from previous episode of care 12/13/21  HEP in sidelying for A/ROM and weightbearing in seated- 01/17/22 Seated  AA/ROM with cane          GOALS: Goals reviewed with patient? No  SHORT TERM GOALS: Target date: 01/04/2022    Status:  1 Pt will be independent with HEP targeting LUE range of motion and NMR  Baseline:  MET  2 Pt will demonstrate at least 10% active extension of LUE hand. Baseline: trace ongoing  3 Pt will demonstrate active LUE movement to reach to low level surface for progression to reaching and obtaining objects  Baseline: trace activation ongoing      LONG TERM GOALS: Target date: 02/15/2022    Status:  1 Pt will be independent with updated HEP.  Baseline:  Initial  2 Pt will be mod I with all basic ADLs.             Baseline: min A for bathing Initial  3 Pt will be independent with Aquatics HEP PRN Baseline:  Deferred pt to work with PT in the pool  4 Pt will demonstrate ability to grasp and release cylindrical object and/or 1 inch block with LUE. Baseline: able to grasp/trace release Initial    ASSESSMENT:  CLINICAL IMPRESSION: Pt demonstrates understanding of activities to perform at home. He continues to require v.c to avoid over activation  PERFORMANCE DEFICITS in functional skills including ADLs, IADLs, coordination, dexterity, tone, ROM, strength, flexibility, FMC, GMC, balance, decreased knowledge of use of DME, vision, and UE functional use, cognitive skills including attention, and psychosocial skills including coping strategies and environmental adaptation.   IMPAIRMENTS are limiting patient from ADLs, IADLs, and leisure.   COMORBIDITIES may have co-morbidities  that affects occupational performance. Patient will benefit from skilled OT to address above impairments and improve overall function.  MODIFICATION OR ASSISTANCE TO COMPLETE EVALUATION: Min-Moderate modification of tasks or assist with assess necessary to complete an evaluation.  OT OCCUPATIONAL PROFILE AND HISTORY: Detailed assessment: Review of records and additional review of physical, cognitive, psychosocial  history related to current functional performance.  CLINICAL DECISION MAKING: Moderate - several treatment options, min-mod task modification necessary  REHAB POTENTIAL: Good  EVALUATION COMPLEXITY: Moderate    PLAN: OT FREQUENCY: 1-2x/week (1x/week for land, may add another visit to accommodate aquatics if available for patient)  OT DURATION: 10 weeks  8 visits over 10 weeks d/t any scheduling conflicts and to conserve visits PRN for visit limit.  PLANNED INTERVENTIONS: self care/ADL training, therapeutic exercise, therapeutic activity, neuromuscular re-education, manual therapy, passive range of motion, functional mobility training, aquatic therapy, splinting, electrical stimulation, fluidotherapy, patient/family education, cognitive remediation/compensation, energy conservation, coping strategies training, and DME and/or AE instructions  RECOMMENDED OTHER SERVICES: pt currently has PT POC  CONSULTED AND AGREED WITH PLAN OF CARE: Patient  PLAN FOR NEXT SESSION: d/c next visit, LUE NMR, consider estim, weightbearing  Spyridon Hornstein, OT 01/25/2022, 7:39 AM Theone Murdoch, OTR/L [phone] Phone: [phone] 7:39 AM 01/25/22

## 2022-01-28 ENCOUNTER — Ambulatory Visit: Payer: BC Managed Care – PPO

## 2022-01-28 ENCOUNTER — Encounter: Payer: Self-pay | Admitting: Physical Therapy

## 2022-01-28 ENCOUNTER — Ambulatory Visit: Payer: BC Managed Care – PPO | Admitting: Physical Therapy

## 2022-01-28 DIAGNOSIS — I69854 Hemiplegia and hemiparesis following other cerebrovascular disease affecting left non-dominant side: Secondary | ICD-10-CM | POA: Diagnosis not present

## 2022-01-28 DIAGNOSIS — R41840 Attention and concentration deficit: Secondary | ICD-10-CM | POA: Diagnosis not present

## 2022-01-28 DIAGNOSIS — R2681 Unsteadiness on feet: Secondary | ICD-10-CM

## 2022-01-28 DIAGNOSIS — R278 Other lack of coordination: Secondary | ICD-10-CM | POA: Diagnosis not present

## 2022-01-28 DIAGNOSIS — M6281 Muscle weakness (generalized): Secondary | ICD-10-CM | POA: Diagnosis not present

## 2022-01-28 DIAGNOSIS — R2689 Other abnormalities of gait and mobility: Secondary | ICD-10-CM

## 2022-01-28 DIAGNOSIS — R29818 Other symptoms and signs involving the nervous system: Secondary | ICD-10-CM | POA: Diagnosis not present

## 2022-01-28 DIAGNOSIS — M25512 Pain in left shoulder: Secondary | ICD-10-CM | POA: Diagnosis not present

## 2022-01-28 NOTE — Therapy (Addendum)
OUTPATIENT PHYSICAL THERAPY TREATMENT NOTE/ PROGRESS NOTE/DISCHARGE SUMMARY    Patient Name: David Craig MRN: [id number] DOB:Jun 06, 1985, 36 y.o., male Today's Date: 01/28/2022  PCP: Maude Leriche, PA-C REFERRING PROVIDER: Alger Simons, MD  Physical Therapy Progress Note   Dates of Reporting Period:11/28/21- 01/28/22  See Note below for Objective Data and Assessment of Progress/Goals.  Thank you for the referral of this patient. Estevan Ryder, PT, DPT, CBIS  PHYSICAL THERAPY DISCHARGE SUMMARY  Visits from Start of Care: 10  Current functional level related to goals / functional outcomes: Patient remaining ModI with SPC use. Spastic gait pattern with compensatory circumduction and genu recurvatum remain   Remaining deficits: See above   Education / Equipment: PT POC, HEP, rehab prognosis   Patient agrees to discharge. Patient goals were partially met. Patient is being discharged due to meeting the stated rehab goals.   END OF SESSION:   PT End of Session - 01/28/22 1216     Visit Number 10    Number of Visits 21   with re-cert   Date for PT Re-Evaluation 02/14/22    Authorization Type BCBS (VL: 30 PT/OT) per year    PT Start Time 1100    PT Stop Time 1144    PT Time Calculation (min) 44 min    Equipment Utilized During Treatment Other (comment)   pool noodles, aquatic cuffs   Activity Tolerance Patient tolerated treatment well    Behavior During Therapy WFL for tasks assessed/performed              History reviewed. No pertinent past medical history. Past Surgical History:  Procedure Laterality Date   BUBBLE STUDY  02/21/2021   Procedure: BUBBLE STUDY;  Surgeon: Skeet Latch, MD;  Location: [place];  Service: Cardiovascular;;   IR ANGIO INTRA EXTRACRAN SEL COM CAROTID INNOMINATE UNI L MOD SED  02/15/2021   IR ANGIO VERTEBRAL SEL VERTEBRAL BILAT MOD SED  02/15/2021   IR CT HEAD LTD  02/15/2021   IR PERCUTANEOUS ART THROMBECTOMY/INFUSION  INTRACRANIAL INC DIAG ANGIO  02/15/2021   IR US GUIDE VASC ACCESS RIGHT  02/15/2021   RADIOLOGY WITH ANESTHESIA N/A 02/14/2021   Procedure: IR WITH ANESTHESIA;  Surgeon: Luanne Bras, MD;  Location: [place];  Service: Radiology;  Laterality: N/A;   TEE WITHOUT CARDIOVERSION N/A 02/21/2021   Procedure: TRANSESOPHAGEAL ECHOCARDIOGRAM (TEE);  Surgeon: Skeet Latch, MD;  Location: [place];  Service: Cardiovascular;  Laterality: N/A;   Patient Active Problem List   Diagnosis Date Noted   Acquired subluxation of left shoulder 06/13/2021   Homonymous hemianopsia, left 06/13/2021   Sprain of left ankle    Sleep disturbance    Hyperglycemia    Hypoalbuminemia due to protein-calorie malnutrition ([place])    Dyslipidemia    Hemiparesis affecting left side as late effect of stroke ([place])    Lethargy    Cerebrovascular accident (CVA) due to occlusion of right posterior cerebral artery ([place]) 02/22/2021   Cerebrovascular accident (CVA) ([place])    Right carotid artery occlusion 02/14/2021    REFERRING DIAG: N30.051 (ICD-10-CM) - Hemiparesis affecting left side as late effect of stroke ([place])  THERAPY DIAG:  Unsteadiness on feet  Other abnormalities of gait and mobility  Hemiplegia and hemiparesis following other cerebrovascular disease affecting left non-dominant side ([place])  Rationale for Evaluation and Treatment Rehabilitation  PERTINENT HISTORY: R PCA CVA, polysubstance abuse, migraine  PRECAUTIONS: Fall  SUBJECTIVE: No new complaints. No falls. Felt good after last aquatic session.   PAIN:  Are  you having pain? No    TODAY'S TREATMENT: Aquatic therapy at [facility] - pool temp 90 degrees  Patient seen for aquatic therapy today. Treatment took place in water 3.5-4.5 feet deep depending upon activity.  Pt entered/exited pool via stairs with single rail, supervision for safety.   Use of yellow noodle for gait into deeper part of pool, ~4.3-4.5 foot water depth Forward for 18  feet x 8 laps Backward for 18 feet x 8 laps Side stepping left<>right x 4 laps each way   At wall of pool in ~4.3 foot water depth with bil aquatic cuffs on. Performed the following for 20 reps each with cues on form/technique. Heel<>toe raises Alternating marching Alternating HS curls with assist on left knee flexion at times Alternating hip abd/add Alternating hip extension   Strattled over yellow noodle with second pool noodle under arms across chest working on bicycling around deeper end of pool x 5 mintues    With 2 yellow noodles across chest under arms working on the following LE kicks for 18 feet x 6 laps each: scissor, then butterfly. Up to min assist to steer/turn at times      Pt requires buoyancy of water for support for reduced fall risk with gait training and balance exercises with minimal UE support; exercises able to be performed safely in water without the risk of fall compared to those same exercises performed on land;  viscosity of water needed for resistance for strengthening.  Current of water provides perturbations for challenging static & dynamic standing balance.        PATIENT EDUCATION: Education details:aquatic ex's for home; plan for discharge after today Person educated: Patient Education method: Explanation Education comprehension: verbalized understanding     HOME EXERCISE PROGRAM: Access Code: [email or web address] URL: [email or web address] Date: 12/07/2021 Prepared by: Baldomero Lamy   Exercises - Seated Heel Slide  - 1 x daily - 5 x weekly - 2 sets - 10 reps (Completed first set with towel under LLE x 10 reps, second set compelted with yellow theraband x  10 reps), cues for technique - Step Up  - 1 x daily - 5 x weekly - 2 sets - 10 reps (leading with LLE and focus on eccentric control with reduced UE support to promote LLE strengthening) - No Picture Provided as Unable to Find Proper Picture - Sit to Stands with 4" Block under RLE (cues  for technique)  - Standing Ankle Dorsiflexion Stretch  - 1 x daily - 7 x weekly - 3 sets - 10 reps - Long Sitting Calf Stretch with Strap  - 1 x daily - 7 x weekly - 3 sets - 10 reps   GOALS: Goals reviewed with patient? Yes   NEW LONG TERM GOALS : target date: 02/14/22 1.  Pt will improve FGA to >/= 26/30 to demonstrate improved balance and reduced fall risk Baseline: 16/30; 25/30 Goal status: NOT MET  2. Patient will be independent with aquatic HEP   Baseline: 01/28/22: met with past 2 aquatic sessions   Goal Status: MET   ASSESSMENT:   CLINICAL IMPRESSION: Patient seen for skilled PT session in the aquatic setting focused on ex's/gait pt can do at pool in community. No issues noted or reported in session.    OBJECTIVE IMPAIRMENTS Abnormal gait, decreased activity tolerance, decreased balance, decreased coordination, decreased endurance, decreased knowledge of condition, decreased knowledge of use of DME, decreased mobility, difficulty walking, decreased ROM, and decreased strength.    ACTIVITY LIMITATIONS carrying, lifting, bending,  standing, squatting, stairs, transfers, bed mobility, locomotion level, and caring for others   PARTICIPATION LIMITATIONS: meal prep, cleaning, laundry, driving, shopping, community activity, occupation, and yard work   PERSONAL FACTORS Age, Fitness, Profession, Time since onset of injury/illness/exacerbation, and 1-2 comorbidities: polysubstance abuse, migraines  are also affecting patient's functional outcome.    REHAB POTENTIAL: Fair time since onset   CLINICAL DECISION MAKING: Stable/uncomplicated   EVALUATION COMPLEXITY: Low   PLAN: PT FREQUENCY: 2x/week + 1x/wk (re-cert)   PT DURATION: 8 weeks + 4 weeks (re-cert)   PLANNED INTERVENTIONS: Therapeutic exercises, Therapeutic activity, Neuromuscular re-education, Balance training, Gait training, Patient/Family education, Joint mobilization, Stair training, Vestibular training, Orthotic/Fit  training, DME instructions, Aquatic Therapy, Electrical stimulation, Cryotherapy, Moist heat, Splintting, Taping, Manual therapy, and Re-evaluation   PLAN FOR NEXT SESSION:   Discharge per PT plan of care    Willow Ora, PTA, [place] [address], [place], [place] [address] [phone] 01/28/22, 12:16 PM   ADDENDUM: Debbora Dus, PT, DPT, CBIS

## 2022-01-31 ENCOUNTER — Ambulatory Visit: Payer: BC Managed Care – PPO | Admitting: Occupational Therapy

## 2022-01-31 ENCOUNTER — Ambulatory Visit: Payer: BC Managed Care – PPO

## 2022-01-31 ENCOUNTER — Encounter: Payer: Self-pay | Admitting: Occupational Therapy

## 2022-01-31 DIAGNOSIS — M25512 Pain in left shoulder: Secondary | ICD-10-CM | POA: Diagnosis not present

## 2022-01-31 DIAGNOSIS — R41840 Attention and concentration deficit: Secondary | ICD-10-CM | POA: Diagnosis not present

## 2022-01-31 DIAGNOSIS — I69854 Hemiplegia and hemiparesis following other cerebrovascular disease affecting left non-dominant side: Secondary | ICD-10-CM | POA: Diagnosis not present

## 2022-01-31 DIAGNOSIS — M6281 Muscle weakness (generalized): Secondary | ICD-10-CM | POA: Diagnosis not present

## 2022-01-31 DIAGNOSIS — R29818 Other symptoms and signs involving the nervous system: Secondary | ICD-10-CM

## 2022-01-31 DIAGNOSIS — R2681 Unsteadiness on feet: Secondary | ICD-10-CM | POA: Diagnosis not present

## 2022-01-31 DIAGNOSIS — R278 Other lack of coordination: Secondary | ICD-10-CM

## 2022-01-31 DIAGNOSIS — R2689 Other abnormalities of gait and mobility: Secondary | ICD-10-CM | POA: Diagnosis not present

## 2022-01-31 NOTE — Therapy (Signed)
OUTPATIENT OCCUPATIONAL THERAPY NEURO TREATMENT  Patient Name: David Craig MRN: [id number] DOB:Oct 22, 1985, 36 y.o., male Today's Date: 01/31/2022  PCP: Perlie Mayo, Durel Salts REFERRING PROVIDER: Meredith Staggers, MD  OCCUPATIONAL THERAPY DISCHARGE SUMMARY Current functional level related to goals / functional outcomes: Pt made progress towards some of his goals however he did not fully achieve all goals due to the severity of deficits.   Remaining deficits: Spasticity, cognitive deficits, LUE weakness, cognitive deficits, decreased functional mobility   Education / Equipment: Pt was educated in HEP and safety for LUE positioning. Therapist recommends that pt avoids use of weights due to risk for injury. Pt verbalizes understanding of all education.   Patient agrees to discharge. Patient goals were partially met. Patient is being discharged due to maximized rehab potential. .      OT End of Session - 01/31/22 1454     Visit Number 8    Number of Visits 17    Date for OT Re-Evaluation 02/15/22    Authorization Type BCBS    Authorization Time Period VL: 30 PT/OT    Authorization - Visit Number 8    Authorization - Number of Visits 15    OT Start Time 1450    OT Stop Time 1530    OT Time Calculation (min) 40 min    Activity Tolerance Patient tolerated treatment well    Behavior During Therapy WFL for tasks assessed/performed                History reviewed. No pertinent past medical history. Past Surgical History:  Procedure Laterality Date   BUBBLE STUDY  02/21/2021   Procedure: BUBBLE STUDY;  Surgeon: Skeet Latch, MD;  Location: [facility];  Service: Cardiovascular;;   IR ANGIO INTRA EXTRACRAN SEL COM CAROTID INNOMINATE UNI L MOD SED  02/15/2021   IR ANGIO VERTEBRAL SEL VERTEBRAL BILAT MOD SED  02/15/2021   IR CT HEAD LTD  02/15/2021   IR PERCUTANEOUS ART THROMBECTOMY/INFUSION INTRACRANIAL INC DIAG ANGIO  02/15/2021   IR US GUIDE VASC ACCESS RIGHT  02/15/2021    RADIOLOGY WITH ANESTHESIA N/A 02/14/2021   Procedure: IR WITH ANESTHESIA;  Surgeon: Luanne Bras, MD;  Location: [place];  Service: Radiology;  Laterality: N/A;   TEE WITHOUT CARDIOVERSION N/A 02/21/2021   Procedure: TRANSESOPHAGEAL ECHOCARDIOGRAM (TEE);  Surgeon: Skeet Latch, MD;  Location: [place];  Service: Cardiovascular;  Laterality: N/A;   Patient Active Problem List   Diagnosis Date Noted   Acquired subluxation of left shoulder 06/13/2021   Homonymous hemianopsia, left 06/13/2021   Sprain of left ankle    Sleep disturbance    Hyperglycemia    Hypoalbuminemia due to protein-calorie malnutrition ([place])    Dyslipidemia    Hemiparesis affecting left side as late effect of stroke ([place])    Lethargy    Cerebrovascular accident (CVA) due to occlusion of right posterior cerebral artery ([place]) 02/22/2021   Cerebrovascular accident (CVA) ([place])    Right carotid artery occlusion 02/14/2021     Rationale for Evaluation and Treatment Rehabilitation    ONSET DATE: Sept 2022, referral date 11/28/21  REFERRING DIAG: W80.321 (ICD-10-CM) - Cerebrovascular accident (CVA) due to occlusion of right posterior cerebral artery ([place])   THERAPY DIAG:  Hemiplegia and hemiparesis following other cerebrovascular disease affecting left non-dominant side (HCC)  Other lack of coordination  Other symptoms and signs involving the nervous system  Attention and concentration deficit  Muscle weakness (generalized)  Acute pain of left shoulder  SUBJECTIVE:   SUBJECTIVE STATEMENT:  Pt reports doing exercises at home Pt accompanied by: self, mom PERTINENT HISTORY: right PCA infarcts in 02/2021, polysubstance abuse/tobacco, migrane headaches and obesity   PRECAUTIONS: Fall, Impulsive, Lt Hemi   PATIENT GOALS Pt motivated for return to PLOF for LUE.  OBJECTIVE:        TODAY'S TREATMENT:   Weightbearing through edge of mat with LUE, then through chair back mod facilitation/  v.c Sidelying on affected side with active and isolated supination/pronation, elbow flexion and extension with mod v.c to avoid overactivation Seated  AA/ROM with vertical cane min facilitation Pt practiced grasp and release of cones, with therapist cueing for release. Pt was educated in use of LUE as a stabilizer to hold plastic bottles for opening with RUE, mod v.c and facillitation.    HOME EXERCISE PROGRAM: 12/07/21 Encouraged to continue supine exercises from previous episode of care 12/13/21  HEP in sidelying for A/ROM and weightbearing in seated- 01/17/22 Seated  AA/ROM with cane         GOALS: Goals reviewed with patient? No  SHORT TERM GOALS: Target date: 01/04/2022    Status:  1 Pt will be independent with HEP targeting LUE range of motion and NMR  Baseline:  MET  2 Pt will demonstrate at least 10% active extension of LUE hand. Baseline: trace Not met  3 Pt will demonstrate active LUE movement to reach to low level surface for progression to reaching and obtaining objects  Baseline: trace activation Partially met, demonstrates mod/ max compensation       LONG TERM GOALS: Target date: 02/15/2022    Status:  1 Pt will be independent with updated HEP.  Baseline:  MET  2 Pt will be mod I with all basic ADLs.             Baseline: min A for bathing Partially MET  3 Pt will be independent with Aquatics HEP PRN Baseline:  Deferred pt to work with PT in the pool  4 Pt will demonstrate ability to grasp and release cylindrical object and/or 1 inch block with LUE. Baseline: able to grasp/trace release Partially met able to grasp but not release volitionally    ASSESSMENT:  CLINICAL IMPRESSION: Pt made some progress towards goals however he did not fully achieve all goals due to severity of deficits.   PERFORMANCE DEFICITS in functional skills including ADLs, IADLs, coordination, dexterity, tone, ROM, strength, flexibility, FMC, GMC, balance, decreased knowledge of use of  DME, vision, and UE functional use, cognitive skills including attention, and psychosocial skills including coping strategies and environmental adaptation.   IMPAIRMENTS are limiting patient from ADLs, IADLs, and leisure.   COMORBIDITIES may have co-morbidities  that affects occupational performance. Patient will benefit from skilled OT to address above impairments and improve overall function.  MODIFICATION OR ASSISTANCE TO COMPLETE EVALUATION: Min-Moderate modification of tasks or assist with assess necessary to complete an evaluation.  OT OCCUPATIONAL PROFILE AND HISTORY: Detailed assessment: Review of records and additional review of physical, cognitive, psychosocial history related to current functional performance.  CLINICAL DECISION MAKING: Moderate - several treatment options, min-mod task modification necessary  REHAB POTENTIAL: Good  EVALUATION COMPLEXITY: Moderate    PLAN: OT FREQUENCY: 1-2x/week (1x/week for land, may add another visit to accommodate aquatics if available for patient)  OT DURATION: 10 weeks  8 visits over 10 weeks d/t any scheduling conflicts and to conserve visits PRN for visit limit.  PLANNED INTERVENTIONS: self care/ADL training, therapeutic exercise, therapeutic activity, neuromuscular re-education, manual therapy, passive  range of motion, functional mobility training, aquatic therapy, splinting, electrical stimulation, fluidotherapy, patient/family education, cognitive remediation/compensation, energy conservation, coping strategies training, and DME and/or AE instructions  RECOMMENDED OTHER SERVICES: pt currently has PT POC  CONSULTED AND AGREED WITH PLAN OF CARE: Patient  PLAN FOR NEXT SESSION: d/c  Zury Fazzino, OT 01/31/2022, 2:59 PM Theone Murdoch, OTR/L [phone] Phone: [phone] 2:59 PM 01/31/22

## 2022-04-04 ENCOUNTER — Encounter: Payer: Self-pay | Admitting: Physical Medicine & Rehabilitation

## 2022-04-04 ENCOUNTER — Telehealth: Payer: Self-pay | Admitting: Physical Medicine & Rehabilitation

## 2022-04-04 NOTE — Telephone Encounter (Signed)
Requesting prescription refill Atorvastatin

## 2022-04-04 NOTE — Telephone Encounter (Signed)
Notified to contact PCP.

## 2022-05-08 ENCOUNTER — Encounter: Payer: 59 | Attending: Physical Medicine & Rehabilitation | Admitting: Physical Medicine & Rehabilitation

## 2022-05-08 ENCOUNTER — Encounter: Payer: Self-pay | Admitting: Physical Medicine & Rehabilitation

## 2022-05-08 VITALS — BP 125/87 | HR 82 | Ht 69.0 in | Wt 211.0 lb

## 2022-05-08 DIAGNOSIS — I63531 Cerebral infarction due to unspecified occlusion or stenosis of right posterior cerebral artery: Secondary | ICD-10-CM | POA: Diagnosis not present

## 2022-05-08 DIAGNOSIS — H53462 Homonymous bilateral field defects, left side: Secondary | ICD-10-CM | POA: Insufficient documentation

## 2022-05-08 NOTE — Patient Instructions (Signed)
ALWAYS FEEL FREE TO CALL OUR OFFICE WITH ANY PROBLEMS OR QUESTIONS ([phone])  **PLEASE NOTE** ALL MEDICATION REFILL REQUESTS (INCLUDING CONTROLLED SUBSTANCES) NEED TO BE MADE AT LEAST 7 DAYS PRIOR TO REFILL BEING DUE. ANY REFILL REQUESTS INSIDE THAT TIME FRAME MAY RESULT IN DELAYS IN RECEIVING YOUR PRESCRIPTION.                    

## 2022-05-08 NOTE — Progress Notes (Signed)
c    Subjective:    Patient ID: David Craig, male    DOB: 1985-12-27, 36 y.o.   MRN: [id number]  HPI  David Craig is here in follow up of his CVA. He has had modest gains with therapy. He is doing work on his own. He still is smoking despite trying 3 days without.  Apparently also he has smoked some marijuana.  He has noticed some improvement in his walking.  His pain levels have improved quite a bit.  He is wearing his AFO regularly.  He has been trying to initiate with the left hand as well but the hand still can stick at times.  He feels that his vision has improved somewhat and would like to get back to driving eventually.  Patient had questions about alternative therapies for recovery including hyperbaric oxygen and acupuncture.  He also asked about the use of mushrooms for neurological improvements.   Pain Inventory Average Pain 0 Pain Right Now 0 My pain is  no pain  LOCATION OF PAIN  no pain  BOWEL Number of stools per week: 8 Oral laxative use No  Type of laxative . Enema or suppository use No  History of colostomy No  Incontinent No   BLADDER Normal In and out cath, frequency . Able to self cath  . Bladder incontinence No  Frequent urination No  Leakage with coughing No  Difficulty starting stream No  Incomplete bladder emptying No    Mobility walk without assistance walk with assistance how many minutes can you walk? 45 ability to climb steps?  yes do you drive?  no  Function not employed: date last employed 04/01/21 disabled: date disabled . I need assistance with the following:  bathing, meal prep, and household duties  Neuro/Psych trouble walking  Prior Studies Any changes since last visit?  no  Physicians involved in your care Any changes since last visit?  no   No family history on file. Social History   Socioeconomic History   Marital status: Single    Spouse name: Not on file   Number of children: 2   Years of education: Not on file    Highest education level: Not on file  Occupational History   Not on file  Tobacco Use   Smoking status: Some Days    Types: Cigarettes    Last attempt to quit: 03/15/2015    Years since quitting: 7.1   Smokeless tobacco: Never  Vaping Use   Vaping Use: Former  Substance and Sexual Activity   Alcohol use: Yes    Comment: occasional   Drug use: Yes    Types: Cocaine, Marijuana, Oxycodone   Sexual activity: Yes    Partners: Female  Other Topics Concern   Not on file  Social History Narrative   Not on file   Social Determinants of Health   Financial Resource Strain: Not on file  Food Insecurity: Not on file  Transportation Needs: Not on file  Physical Activity: Not on file  Stress: Not on file  Social Connections: Not on file   Past Surgical History:  Procedure Laterality Date   BUBBLE STUDY  02/21/2021   Procedure: BUBBLE STUDY;  Surgeon: Chilton Si, MD;  Location: [facility] ENDOSCOPY;  Service: Cardiovascular;;   IR ANGIO INTRA EXTRACRAN SEL COM CAROTID INNOMINATE UNI L MOD SED  02/15/2021   IR ANGIO VERTEBRAL SEL VERTEBRAL BILAT MOD SED  02/15/2021   IR CT HEAD LTD  02/15/2021   IR PERCUTANEOUS ART THROMBECTOMY/INFUSION  INTRACRANIAL INC DIAG ANGIO  02/15/2021   IR US GUIDE VASC ACCESS RIGHT  02/15/2021   RADIOLOGY WITH ANESTHESIA N/A 02/14/2021   Procedure: IR WITH ANESTHESIA;  Surgeon: Julieanne Cotton, MD;  Location: [facility] OR;  Service: Radiology;  Laterality: N/A;   TEE WITHOUT CARDIOVERSION N/A 02/21/2021   Procedure: TRANSESOPHAGEAL ECHOCARDIOGRAM (TEE);  Surgeon: Chilton Si, MD;  Location: [facility] ENDOSCOPY;  Service: Cardiovascular;  Laterality: N/A;   No past medical history on file. BP 125/87   Pulse 82   Ht 5\' 9"  (1.753 m)   Wt 211 lb (95.7 kg)   SpO2 98%   BMI 31.16 kg/m   Opioid Risk Score:   Fall Risk Score:  `1  Depression screen [facility] 2/9     09/05/2021    1:37 PM 06/13/2021    1:44 PM 04/03/2021    1:18 PM  Depression screen PHQ 2/9  Decreased  Interest 0 0 0  Down, Depressed, Hopeless 0 0 0  PHQ - 2 Score 0 0 0  Altered sleeping   1  Tired, decreased energy   0  Change in appetite   0  Feeling bad or failure about yourself    0  Trouble concentrating   0  Moving slowly or fidgety/restless   2  Suicidal thoughts   0  PHQ-9 Score   3     Review of Systems  All other systems reviewed and are negative.     Objective:   Physical Exam  Constitutional: No distress . Vital signs reviewed. HEENT: NCAT, EOMI, oral membranes moist Neck: supple Cardiovascular: RRR without murmur. No JVD    Respiratory/Chest: CTA Bilaterally without wheezes or rales. Normal effort    GI/Abdomen: BS +, non-tender, non-distended Ext: no clubbing, cyanosis, or edema Psych: pleasant and cooperative  Neuro: Patient is alert and oriented x3.  Has ongoing left homonymous hemianopsia with recovery of lower 40-50% now.  Left upper extremity strength is 1+ to 2 proximal to 2- distally. .   flexor tone clinically at the wrist and fingers at 2 out of 4.  Left lower extremity is 3 out of 5 hip flexors and knee extensors as well as 2+ out of 5 plantar flexion and 1 ankle dorsiflexion.    Sensation slightly diminished on the left side once again to pinprick and light touch.  Deep tendon reflexes are 3+.  better left leg swing..  left leg still catches during swing but he corrects with sl circumduction notd.d Musculoskeletal: mild subluxation of the left shoulder present with some pain and passive range of motion at the shoulder.        Medical Problem List and Plan:  1.  Left-sided hemiparesis secondary to right PCA infarction secondary to terminal right ICA occlusion status post failed mechanical thrombectomy etiology likely secondary to cocaine use/vasospasm             -continue HP therapy             -Still recommend cane for balance and mechanics              -ECASA             -Can't drive d/t left homonymous hemianopsia now with sparing in lower left   40-50%.  We will continue to monitor as he would need substantially more recovery to drive an automobile .     -may be candidate for vivistim 2.  Spasticity:             -  Tizanidine 2mg  tid effective--encouraged to resume             -Continue splinting and range of motion             -will set up for botox 200u left finger and wrist flexors 4. Mood/sleep-wake:               -Maintain Seroquel for sleep asneeded 8.  Smoking cessation:             -discussed smoking cessation and smoking's impact on his stroke risk and life expectancy. Once again     20  total minutes were spent in examination of patient, assessment of pertinent data,  formulation of a treatment plan, and in discussion with patient and/or family.  f/u in 6 weeks for botox

## 2022-07-03 ENCOUNTER — Encounter: Payer: Self-pay | Admitting: Physical Medicine & Rehabilitation

## 2022-07-03 ENCOUNTER — Encounter
Payer: BLUE CROSS/BLUE SHIELD | Attending: Physical Medicine & Rehabilitation | Admitting: Physical Medicine & Rehabilitation

## 2022-07-03 VITALS — BP 111/77 | HR 87 | Ht 69.0 in | Wt 224.0 lb

## 2022-07-03 DIAGNOSIS — G8114 Spastic hemiplegia affecting left nondominant side: Secondary | ICD-10-CM | POA: Diagnosis not present

## 2022-07-03 MED ORDER — INCOBOTULINUMTOXINA 100 UNITS IM SOLR
200.0000 [IU] | Freq: Once | INTRAMUSCULAR | Status: AC
  Administered 2022-07-03: 200 [IU] via INTRAMUSCULAR

## 2022-07-03 MED ORDER — INCOBOTULINUMTOXINA 100 UNITS IM SOLR: 100.0000 [IU] | Freq: Once | INTRAMUSCULAR | Status: DC

## 2022-07-03 NOTE — Addendum Note (Signed)
Addended by: Franchot Gallo on: 07/03/2022 02:59 PM   Modules accepted: Orders

## 2022-07-03 NOTE — Patient Instructions (Signed)
ALWAYS FEEL FREE TO CALL OUR OFFICE WITH ANY PROBLEMS OR QUESTIONS ([phone])  **PLEASE NOTE** ALL MEDICATION REFILL REQUESTS (INCLUDING CONTROLLED SUBSTANCES) NEED TO BE MADE AT LEAST 7 DAYS PRIOR TO REFILL BEING DUE. ANY REFILL REQUESTS INSIDE THAT TIME FRAME MAY RESULT IN DELAYS IN RECEIVING YOUR PRESCRIPTION.                    

## 2022-07-03 NOTE — Progress Notes (Addendum)
Xeomin Injection for spasticity of upper extremity using needle EMG guidance Indication: No diagnosis found.  Pt received 200 units of xeomin samples we had on hand in office.    Dilution: 100 Units/ml        Total Units Injected: 200 Indication: Severe spasticity which interferes with ADL,mobility and/or  hygiene and is unresponsive to medication management and other conservative care Informed consent was obtained after describing risks and benefits of the procedure with the patient. This includes bleeding, bruising, infection, excessive weakness, or medication side effects. A REMS form is on file and signed.  Needle: 50mm injectable monopolar needle electrode    Number of units per muscle Pectoralis Major 0 units Pectoralis Minor 0 units Biceps 0 units Brachioradialis 0 units FCR 25 units FCU 25 units FDS 75 units FDP 75 units FPL 0 units Palmaris Longus 0 units Pronator Teres 0 units Pronator Quadratus 0 units Lumbricals 0 units All injections were done after obtaining appropriate EMG activity and after negative drawback for blood. The patient tolerated the procedure well. Post procedure instructions were given. Return in about 3 months (around 10/01/2022) for spasticity mgt.

## 2022-07-03 NOTE — Addendum Note (Signed)
Addended by: Franchot Gallo on: 07/03/2022 02:52 PM   Modules accepted: Orders

## 2022-07-15 ENCOUNTER — Encounter: Payer: Self-pay | Admitting: Physical Medicine & Rehabilitation

## 2022-07-15 ENCOUNTER — Other Ambulatory Visit: Payer: Self-pay

## 2022-07-16 ENCOUNTER — Telehealth: Payer: Self-pay

## 2022-07-16 MED ORDER — QUETIAPINE FUMARATE 50 MG PO TABS: 50.0000 mg | ORAL_TABLET | ORAL | 6 refills | Status: DC | PRN

## 2022-07-16 NOTE — Telephone Encounter (Signed)
PA submitted for Quetiapine Fumarate

## 2022-07-17 NOTE — Telephone Encounter (Signed)
Approved 07/16/22-07/16/2023

## 2022-08-14 ENCOUNTER — Telehealth: Payer: Self-pay | Admitting: Physical Medicine & Rehabilitation

## 2022-08-14 NOTE — Telephone Encounter (Signed)
Patient received jury summons. He is requesting a letter stating he is unable to participate. Madaline Savage Duty is for 09/23/2021.

## 2022-08-20 ENCOUNTER — Telehealth: Payer: Self-pay

## 2022-08-20 NOTE — Telephone Encounter (Signed)
Mychart msg sent

## 2022-10-02 ENCOUNTER — Encounter: Payer: BLUE CROSS/BLUE SHIELD | Admitting: Physical Medicine & Rehabilitation

## 2023-02-18 ENCOUNTER — Other Ambulatory Visit: Payer: Self-pay | Admitting: Physical Medicine & Rehabilitation

## 2023-03-20 ENCOUNTER — Telehealth: Payer: Self-pay

## 2023-03-20 NOTE — Telephone Encounter (Signed)
Received a form from [employer] for [employer] brain computer interface controlled therapy. Pt has not seen [place] since March 2023 and is not following this office at this time. I contacted pt and informed him he may want to reach out to Ranelle Oyster, MD Physical Medicine and Rehabilitation, to see if they can assess and complete forms. Pt verbally understood and was appreciative.

## 2023-07-06 ENCOUNTER — Other Ambulatory Visit: Payer: Self-pay | Admitting: Physical Medicine & Rehabilitation

## 2023-11-18 ENCOUNTER — Other Ambulatory Visit: Payer: Self-pay | Admitting: Physical Medicine & Rehabilitation

## 2024-01-06 DIAGNOSIS — R2689 Other abnormalities of gait and mobility: Secondary | ICD-10-CM | POA: Diagnosis not present

## 2024-01-06 DIAGNOSIS — M6281 Muscle weakness (generalized): Secondary | ICD-10-CM | POA: Diagnosis not present

## 2024-01-12 DIAGNOSIS — I6930 Unspecified sequelae of cerebral infarction: Secondary | ICD-10-CM | POA: Diagnosis not present

## 2024-01-12 DIAGNOSIS — M21372 Foot drop, left foot: Secondary | ICD-10-CM | POA: Diagnosis not present

## 2024-01-12 DIAGNOSIS — G4700 Insomnia, unspecified: Secondary | ICD-10-CM | POA: Diagnosis not present

## 2024-01-12 DIAGNOSIS — E7800 Pure hypercholesterolemia, unspecified: Secondary | ICD-10-CM | POA: Diagnosis not present

## 2024-02-23 DIAGNOSIS — I679 Cerebrovascular disease, unspecified: Secondary | ICD-10-CM | POA: Diagnosis not present

## 2024-02-23 DIAGNOSIS — Z5181 Encounter for therapeutic drug level monitoring: Secondary | ICD-10-CM | POA: Diagnosis not present

## 2024-02-25 DIAGNOSIS — G4700 Insomnia, unspecified: Secondary | ICD-10-CM | POA: Diagnosis not present

## 2024-02-25 DIAGNOSIS — I6930 Unspecified sequelae of cerebral infarction: Secondary | ICD-10-CM | POA: Diagnosis not present

## 2024-05-20 ENCOUNTER — Other Ambulatory Visit: Payer: Self-pay

## 2024-05-20 ENCOUNTER — Encounter: Payer: Self-pay | Admitting: Occupational Therapy

## 2024-05-20 ENCOUNTER — Ambulatory Visit: Attending: Nurse Practitioner | Admitting: Occupational Therapy

## 2024-05-20 VITALS — BP 156/100

## 2024-05-20 DIAGNOSIS — I69854 Hemiplegia and hemiparesis following other cerebrovascular disease affecting left non-dominant side: Secondary | ICD-10-CM | POA: Diagnosis present

## 2024-05-20 DIAGNOSIS — R29818 Other symptoms and signs involving the nervous system: Secondary | ICD-10-CM | POA: Diagnosis present

## 2024-05-20 DIAGNOSIS — R41842 Visuospatial deficit: Secondary | ICD-10-CM | POA: Insufficient documentation

## 2024-05-20 DIAGNOSIS — R29898 Other symptoms and signs involving the musculoskeletal system: Secondary | ICD-10-CM | POA: Diagnosis present

## 2024-05-20 NOTE — Therapy (Signed)
 OUTPATIENT OCCUPATIONAL THERAPY EVALUATION  Patient Name: David Craig MRN: [id number] DOB:09-27-1985, 38 y.o., male Today's Date: 05/20/2024  PCP: Seabron Lenis, MD  REFERRING PROVIDER: Laurice Grice, OD  END OF SESSION:  OT End of Session - 05/20/24 1025     Visit Number 1    Number of Visits 17    Date for Recertification  07/29/24    Authorization Type [employer] Medicare    OT Start Time 1023    OT Stop Time 1112    OT Time Calculation (min) 49 min    Activity Tolerance Patient tolerated treatment well    Behavior During Therapy [facility] for tasks assessed/performed          History reviewed. No pertinent past medical history. Past Surgical History:  Procedure Laterality Date   BUBBLE STUDY  02/21/2021   Procedure: BUBBLE STUDY;  Surgeon: Raford Riggs, MD;  Location: [facility] ENDOSCOPY;  Service: Cardiovascular;;   IR ANGIO INTRA EXTRACRAN SEL COM CAROTID INNOMINATE UNI L MOD SED  02/15/2021   IR ANGIO VERTEBRAL SEL VERTEBRAL BILAT MOD SED  02/15/2021   IR CT HEAD LTD  02/15/2021   IR PERCUTANEOUS ART THROMBECTOMY/INFUSION INTRACRANIAL INC DIAG ANGIO  02/15/2021   IR US  GUIDE VASC ACCESS RIGHT  02/15/2021   RADIOLOGY WITH ANESTHESIA N/A 02/14/2021   Procedure: IR WITH ANESTHESIA;  Surgeon: Dolphus Carrion, MD;  Location: [facility] OR;  Service: Radiology;  Laterality: N/A;   TEE WITHOUT CARDIOVERSION N/A 02/21/2021   Procedure: TRANSESOPHAGEAL ECHOCARDIOGRAM (TEE);  Surgeon: Raford Riggs, MD;  Location: [facility] ENDOSCOPY;  Service: Cardiovascular;  Laterality: N/A;   Patient Active Problem List   Diagnosis Date Noted   Spastic hemiparesis of left nondominant side (HCC) 07/03/2022   Acquired subluxation of left shoulder 06/13/2021   Homonymous hemianopsia, left 06/13/2021   Sprain of left ankle    Sleep disturbance    Hyperglycemia    Hypoalbuminemia due to protein-calorie malnutrition    Dyslipidemia    Hemiparesis affecting left side as late effect of stroke (HCC)    Lethargy     Cerebrovascular accident (CVA) due to occlusion of right posterior cerebral artery (HCC) 02/22/2021   Cerebrovascular accident (CVA) (HCC)    Right carotid artery occlusion 02/14/2021    ONSET DATE: 04/05/2024 (Date of referral)  REFERRING DIAG: History of stroke with residual deficit (I69.30)   THERAPY DIAG:  Visuospatial deficit  Other symptoms and signs involving the musculoskeletal system  Other symptoms and signs involving the nervous system  Hemiplegia and hemiparesis following other cerebrovascular disease affecting left non-dominant side (HCC)  Rationale for Evaluation and Treatment: Rehabilitation  SUBJECTIVE:   SUBJECTIVE STATEMENT: Pt states he has a 60 and a 39 year old son and wants to be able to take care of/spend time with them. Overall, he feels his vision slightly improved since his CVA though reports significant L lateral field cut.   Pt accompanied by: self  PERTINENT HISTORY: CVA, L side hemiparesis, hyperglycemia, L shoulder subluxation, left homonymous hemianopsia, Spastic hemiparesis of left nondominant side    PAIN:  Are you having pain? No  FALLS: Has patient fallen in last 6 months? No  LIVING ENVIRONMENT: Lives with: lives alone Lives in: House/apartment Stairs: Yes: External: 5 steps; on left going up and one set of B rails Has following equipment at home: L AFO  PLOF: Independent; on disability; taking care of kids  PATIENT GOALS: to maintain level of function, better manage health  OBJECTIVE:  Vitals:   05/20/24 1052  BP: (!) 156/100     HAND DOMINANCE: Right  ADLs and IADLs: Overall ADLs and IADLs: mod I (uses rocker knife to cut food)  MOBILITY STATUS: Independent  FUNCTIONAL OUTCOME MEASURES: TBD  COGNITION: Overall cognitive status: Within functional limits for tasks assessed  VISUAL FINDINGS: L homonymous hemianopsia Baseline vision: does not wear glasses, has not had an eye exam in years Visual history:  none  Impaired - able to read clock on wall and text on paper printout  OBSERVATIONS: Pt ambulates with use of L AFO. No loss of balance though unsteady gait. The pt appears well kept and has ball cap donned. L facial droop (chronic presentation)  TODAY'S TREATMENT:                                                                                                                               OT educated pt on rehabilitation process and results of objective measures in relation to pt specific goals. Additional education provided as noted in pt instructions and summarized below.   Vision/Attention Recommendations:  Improve lighting (full-spectrum bulbs, task lighting, reduce glare). Increase contrast for household items (plates, placemats, door frames, stair edges). Reduce visual clutter and busy patterns. Use scanning and head-turning techniques for visual field compensation. Apply line guides and brightly colored tape for reading and workspace boundaries.   Spasticity Management:  Heat, deep pressure, stretching, nighttime splinting, weight-bearing, NMES. Botox injections and muscle relaxers as prescribed by physician.   Additional Recommendations:  Monitor BP, schedule eye exam, maintain hydration, manage stress.  PATIENT EDUCATION: Education details: OT Role and POC; vision strategies; spasticity management; health management Person educated: Patient Education method: Explanation, handouts  Education comprehension: verbalized understanding and needs further education  HOME EXERCISE PROGRAM: 05/20/2024: vision strategies; spasticity management; health management  GOALS:  SHORT TERM GOALS: Target date: 06/17/2024    Patient will independently recall at least 2 compensatory strategies for visual impairment without cueing. Baseline: Goal status: INITIAL  2.  Patient will return demonstration of visual adaptation use and verbalize understanding of how to obtain item(s) if  desired.  Baseline:  Goal status: INITIAL  3.  Patient will independently verbalize at least 3 stress reduction techniques for symptom management as needed for improved participation in occupations.  Baseline:  Goal status: INITIAL  4.   Patient will independently recall 3 sleep hygiene strategies to improve the quality, duration, and consistency of sleep, which supports mental, emotional, and physical health.  Baseline:  Goal status: INITIAL  5.  Pt will independently recall at least 3 ways to manage spasticity in RUE as needed to improve pain and overall function.   Baseline:  Goal status: INITIAL   LONG TERM GOALS: Target date: 07/29/2024    Will set appropriate outcome measure based goal. Baseline: TBD Goal status: INITIAL  2.  Patient will demonstrate independent use of scanning and head-turning strategies during mobility and ADLs to compensate for visual field deficits in  90% of observed opportunities. Baseline:  Goal status: INITIAL  ASSESSMENT:  CLINICAL IMPRESSION: Patient is a 38 y.o. y.o. male who was seen today for occupational therapy evaluation for assessment of vision and LUE deficits following CVA. Hx includes CVA, L side hemiparesis, hyperglycemia, L shoulder subluxation, left homonymous hemianopsia, Spastic hemiparesis of left nondominant side. Patient presents with decreased visual attention and environmental awareness, increasing fall risk and limiting independence in ADLs. Spasticity contributes to functional limitations in mobility and upper extremity use. Therapy to focus on establishment of LUE HEP and provide education with regards to spasticity management. As part of spasticity management and to improve joint integrity, pt may benefit from resting hand splint at night. Recommend prefabricated splint given chronicity of CVA, ability to wash outside cover, ability to adjust as needed, and for pt comfort. Education provided on environmental modifications and  compensatory strategies to enhance safety and participation. Pt to benefit from skilled OT services for education on visual impairment(s), compensatory and safety strategies, and use of AD as needed for more independent and safe completion of daily occupations.     PERFORMANCE DEFICITS: in functional skills including ADLs, IADLs, coordination, tone, ROM, strength, muscle spasms, Fine motor control, Gross motor control, mobility, decreased knowledge of precautions, decreased knowledge of use of DME, vision, and UE functional use.   IMPAIRMENTS: are limiting patient from ADLs, IADLs, rest and sleep, and leisure.   CO-MORBIDITIES: may have co-morbidities  that affects occupational performance. Patient will benefit from skilled OT to address above impairments and improve overall function.  MODIFICATION OR ASSISTANCE TO COMPLETE EVALUATION: Min-Moderate modification of tasks or assist with assess necessary to complete an evaluation.  OT OCCUPATIONAL PROFILE AND HISTORY: Detailed assessment: Review of records and additional review of physical, cognitive, psychosocial history related to current functional performance.  CLINICAL DECISION MAKING: Moderate - several treatment options, min-mod task modification necessary  REHAB POTENTIAL: Fair given chronicity of CVA  EVALUATION COMPLEXITY: Moderate    PLAN:  OT FREQUENCY: 1-2x/week  OT DURATION: 8 weeks  PLANNED INTERVENTIONS: self care/ADL training, therapeutic exercise, therapeutic activity, functional mobility training, patient/family education, visual/perceptual remediation/compensation, coping strategies training, DME and/or AE instructions, and Re-evaluation  RECOMMENDED OTHER SERVICES: None at this time   CONSULTED AND AGREED WITH PLAN OF CARE: Patient   PLAN FOR NEXT SESSION: Set outcome measure based goal Go over general vision packet and vision strategies; initiate visual activities  Check BP; sleep hygiene; stress reduction  techniques    Ischemic CVA - lacks AROM for Vivistim, needs management of spasticity for Myomo  Jocelyn CHRISTELLA Bottom, OT 05/20/2024, 2:33 PM

## 2024-05-20 NOTE — Patient Instructions (Addendum)
°   1. Look for the edge of objects (to the left and/or right) so that you make sure you are seeing all of an object 2. Turn your head when walking, scan from side to side, particularly in busy environments 3. Use an organized scanning pattern. It's usually easier to scan from top to bottom, and left to right (like you are reading) 4. Double check yourself 5. Use a line guide (like a blank piece of paper) or your finger when reading 6. If necessary, place brightly colored tape at end of table or work area as a reminder to always look until you see the tape.    Ways to Manage Spasticity Heat  Deep pressure  Stretching  Use of a splint at night  Weight-bearing (leaning into your arm/hand with your own body weight)  NMES unit  Botox (injections as prescribed by a physician)  Muscle Relaxers (medications as prescribed by a physician)   Recommend monitoring BP, getting eye exam, hydrating, and managing stress

## 2024-06-07 ENCOUNTER — Ambulatory Visit: Attending: Family Medicine | Admitting: Occupational Therapy

## 2024-06-07 DIAGNOSIS — R29818 Other symptoms and signs involving the nervous system: Secondary | ICD-10-CM | POA: Insufficient documentation

## 2024-06-07 DIAGNOSIS — M25632 Stiffness of left wrist, not elsewhere classified: Secondary | ICD-10-CM | POA: Insufficient documentation

## 2024-06-07 DIAGNOSIS — R41842 Visuospatial deficit: Secondary | ICD-10-CM | POA: Insufficient documentation

## 2024-06-07 DIAGNOSIS — M25642 Stiffness of left hand, not elsewhere classified: Secondary | ICD-10-CM | POA: Insufficient documentation

## 2024-06-07 DIAGNOSIS — I69854 Hemiplegia and hemiparesis following other cerebrovascular disease affecting left non-dominant side: Secondary | ICD-10-CM | POA: Insufficient documentation

## 2024-06-07 DIAGNOSIS — M6281 Muscle weakness (generalized): Secondary | ICD-10-CM | POA: Insufficient documentation

## 2024-06-07 DIAGNOSIS — R29898 Other symptoms and signs involving the musculoskeletal system: Secondary | ICD-10-CM | POA: Insufficient documentation

## 2024-06-07 NOTE — Therapy (Signed)
 " OUTPATIENT OCCUPATIONAL THERAPY TREATMENT  Patient Name: David Craig MRN: [id number] DOB:05-Aug-1985, 39 y.o., male Today's Date: 06/07/2024  PCP: Seabron Lenis, MD  REFERRING PROVIDER: Laurice Grice, OD  END OF SESSION:  OT End of Session - 06/07/24 0937     Visit Number 2    Number of Visits 17    Date for Recertification  07/29/24    Authorization Type [employer] Medicare    OT Start Time [phone]    OT Stop Time 1015    OT Time Calculation (min) 38 min    Activity Tolerance Patient tolerated treatment well    Behavior During Therapy [facility] for tasks assessed/performed          No past medical history on file. Past Surgical History:  Procedure Laterality Date   BUBBLE STUDY  02/21/2021   Procedure: BUBBLE STUDY;  Surgeon: Raford Riggs, MD;  Location: [facility] ENDOSCOPY;  Service: Cardiovascular;;   IR ANGIO INTRA EXTRACRAN SEL COM CAROTID INNOMINATE UNI L MOD SED  02/15/2021   IR ANGIO VERTEBRAL SEL VERTEBRAL BILAT MOD SED  02/15/2021   IR CT HEAD LTD  02/15/2021   IR PERCUTANEOUS ART THROMBECTOMY/INFUSION INTRACRANIAL INC DIAG ANGIO  02/15/2021   IR US  GUIDE VASC ACCESS RIGHT  02/15/2021   RADIOLOGY WITH ANESTHESIA N/A 02/14/2021   Procedure: IR WITH ANESTHESIA;  Surgeon: Dolphus Carrion, MD;  Location: [facility] OR;  Service: Radiology;  Laterality: N/A;   TEE WITHOUT CARDIOVERSION N/A 02/21/2021   Procedure: TRANSESOPHAGEAL ECHOCARDIOGRAM (TEE);  Surgeon: Raford Riggs, MD;  Location: [facility] ENDOSCOPY;  Service: Cardiovascular;  Laterality: N/A;   Patient Active Problem List   Diagnosis Date Noted   Spastic hemiparesis of left nondominant side (HCC) 07/03/2022   Acquired subluxation of left shoulder 06/13/2021   Homonymous hemianopsia, left 06/13/2021   Sprain of left ankle    Sleep disturbance    Hyperglycemia    Hypoalbuminemia due to protein-calorie malnutrition    Dyslipidemia    Hemiparesis affecting left side as late effect of stroke (HCC)    Lethargy    Cerebrovascular accident  (CVA) due to occlusion of right posterior cerebral artery (HCC) 02/22/2021   Cerebrovascular accident (CVA) (HCC)    Right carotid artery occlusion 02/14/2021    ONSET DATE: 04/05/2024 (Date of referral)  REFERRING DIAG: History of stroke with residual deficit (I69.30)   THERAPY DIAG:  Stiffness of left wrist, not elsewhere classified  Stiffness of left hand, not elsewhere classified  Muscle weakness (generalized)  Other symptoms and signs involving the nervous system  Rationale for Evaluation and Treatment: Rehabilitation  SUBJECTIVE:   SUBJECTIVE STATEMENT: Pt good time over the holiday. Pt eager to get L hand better.  He reports that he has a splint at home for his L hand but he can't really get it on by himself with just his R hand.  The same applies to his Neuro Rehab glove (although he notes needing a charger also).  Pt encouraged to bring both items to therapy on Wednesday to assess and determine options for self management.  Pt accompanied by: self  PERTINENT HISTORY: CVA, L side hemiparesis, hyperglycemia, L shoulder subluxation, left homonymous hemianopsia, Spastic hemiparesis of left nondominant side    PAIN:  Are you having pain? No  FALLS: Has patient fallen in last 6 months? No  LIVING ENVIRONMENT: Lives with: lives alone Lives in: House/apartment Stairs: Yes: External: 5 steps; on left going up and one set of B rails Has following equipment at home: L AFO  PLOF: Independent; on disability; taking care of kids  PATIENT GOALS: to maintain level of function, better manage health  OBJECTIVE:   HAND DOMINANCE: Right  ADLs and IADLs: Overall ADLs and IADLs: mod I (uses rocker knife to cut food)  MOBILITY STATUS: Independent  FUNCTIONAL OUTCOME MEASURES: TBD  COGNITION: Overall cognitive status: Within functional limits for tasks assessed  VISUAL FINDINGS: L homonymous hemianopsia Baseline vision: does not wear glasses, has not had an eye exam in  years Visual history: none  Impaired - able to read clock on wall and text on paper printout  OBSERVATIONS: Pt ambulates with use of L AFO. No loss of balance though unsteady gait. The pt appears well kept and has ball cap donned. L facial droop (chronic presentation)  TODAY'S TREATMENT:                                                                                                                               Neuromuscular re-education and therapeutic exercises were completed for the duration noted to address L UE spasticity, clonus, and limited functional use secondary to hemiplegia. Patient demonstrates ability to actively grasp with the L hand; however, release is limited and frequent clonus is noted with rapid or forceful contractions. Education and cueing were provided to emphasize slow, controlled squeezing to reduce clonus and improve volitional control. Patient required frequent verbal and tactile cues to inhibit tone and initiate relaxation of the hand following grasp.  Patient engaged in weightbearing activities through the L UE over a domed structure and at the edge of the counter to promote wrist extension, proprioceptive input, and tone inhibition. OT provided hands-on facilitation to encourage digital relaxation during weightbearing, allowing for passive extension of the digits without compensatory DIP hyperextension. Activity was graded to focus on sustained positioning and controlled transitions into and out of weightbearing to minimize reflexive flexor patterns.  PROM and AAROM were completed for the L elbow, wrist, and digits with emphasis on slow stretching, table top slides, positioning, and prolonged holds to increase tissue extensibility, reduce spasticity, and promote improved joint mobility. Patient required ongoing cues to maintain arm extension without triggering increased flexor tone throughout the UE.  Education: Spasticity management options were reviewed, including use  of heat, deep pressure/weightbearing, stretching, splinting, NMES, and medication management as prescribed by physician. Patient reported prior use of baclofen and was encouraged to discuss current spasticity concerns and medication options with PCP at upcoming appointment next month.  Education also emphasized that improved voluntary tone control is necessary to enable effective one-handed donning of a resting hand splint and use of a neuro-rehab glove, as current associated reactions and involuntary flexor activation significantly limit independent self-management of orthotic devices.  PATIENT EDUCATION: Education details: Spasticity management Person educated: Patient Education method: Explanation, Demonstration, Tactile cues, and Verbal cues Education comprehension: verbalized understanding, returned demonstration, verbal cues required, tactile cues required, and needs further education  HOME EXERCISE PROGRAM: 05/20/2024: vision strategies; spasticity  management; health management  GOALS:  SHORT TERM GOALS: Target date: 06/17/2024    Patient will independently recall at least 2 compensatory strategies for visual impairment without cueing. Baseline: New to outpt OT  Goal status: INITIAL  2.  Patient will return demonstration of visual adaptation use and verbalize understanding of how to obtain item(s) if desired.  Baseline: New to outpt OT  Goal status: INITIAL  3.  Patient will independently verbalize at least 3 stress reduction techniques for symptom management as needed for improved participation in occupations.  Baseline: New to outpt OT  Goal status: INITIAL  4.   Patient will independently recall 3 sleep hygiene strategies to improve the quality, duration, and consistency of sleep, which supports mental, emotional, and physical health.  Baseline: New to outpt OT  Goal status: INITIAL  5.  Pt will independently recall at least 3 ways to manage spasticity in RUE as needed to  improve pain and overall function.   Baseline: New to outpt OT  Goal status: IN PROGRESS   LONG TERM GOALS: Target date: 07/29/2024    Will set appropriate outcome measure based goal. Baseline: TBD Goal status: INITIAL  2.  Patient will demonstrate independent use of scanning and head-turning strategies during mobility and ADLs to compensate for visual field deficits in 90% of observed opportunities. Baseline:  Goal status: INITIAL  ASSESSMENT:  CLINICAL IMPRESSION: Patient is a 39 y.o. y.o. male who was seen today for occupational therapy treatment to address chronic LUE deficits following CVA. Spasticity contributes to functional limitations in upper extremity use. OT initiated establishment of LUE HEP and provided further education with regards to spasticity management. As part of spasticity management and to improve joint integrity, pt may benefit from resting hand splint at night which he reports he has at home and is encouraged to bring to outpt OT. Pt to benefit from further skilled OT services for education on spasticity management as well as visual impairment(s), compensatory and safety strategies, and use of AD as needed for more independent and safe completion of daily occupations.     PERFORMANCE DEFICITS: in functional skills including ADLs, IADLs, coordination, tone, ROM, strength, muscle spasms, Fine motor control, Gross motor control, mobility, decreased knowledge of precautions, decreased knowledge of use of DME, vision, and UE functional use.   IMPAIRMENTS: are limiting patient from ADLs, IADLs, rest and sleep, and leisure.   CO-MORBIDITIES: may have co-morbidities  that affects occupational performance. Patient will benefit from skilled OT to address above impairments and improve overall function.  REHAB POTENTIAL: Fair given chronicity of CVA  PLAN:  OT FREQUENCY: 1-2x/week  OT DURATION: 8 weeks  PLANNED INTERVENTIONS: self care/ADL training, therapeutic  exercise, therapeutic activity, functional mobility training, patient/family education, visual/perceptual remediation/compensation, coping strategies training, DME and/or AE instructions, and Re-evaluation  RECOMMENDED OTHER SERVICES: None at this time   CONSULTED AND AGREED WITH PLAN OF CARE: Patient   PLAN FOR NEXT SESSION: Set outcome measure based goal - PSFS (apply splint/Neuro glove (?) Go over general vision packet and vision strategies; initiate visual activities  Needs printed HEP ideas  Check BP; sleep hygiene; stress reduction techniques    Ischemic CVA - lacks AROM for Vivistim, needs management of spasticity for Myomo  Clarita LITTIE Pride, OT 06/07/2024, 12:56 PM          "

## 2024-06-09 ENCOUNTER — Ambulatory Visit: Admitting: Occupational Therapy

## 2024-06-09 ENCOUNTER — Telehealth: Payer: Self-pay | Admitting: Occupational Therapy

## 2024-06-09 DIAGNOSIS — I69854 Hemiplegia and hemiparesis following other cerebrovascular disease affecting left non-dominant side: Secondary | ICD-10-CM

## 2024-06-09 DIAGNOSIS — M25632 Stiffness of left wrist, not elsewhere classified: Secondary | ICD-10-CM

## 2024-06-09 DIAGNOSIS — M25642 Stiffness of left hand, not elsewhere classified: Secondary | ICD-10-CM

## 2024-06-09 DIAGNOSIS — R29898 Other symptoms and signs involving the musculoskeletal system: Secondary | ICD-10-CM

## 2024-06-09 DIAGNOSIS — R29818 Other symptoms and signs involving the nervous system: Secondary | ICD-10-CM

## 2024-06-09 DIAGNOSIS — M6281 Muscle weakness (generalized): Secondary | ICD-10-CM

## 2024-06-09 DIAGNOSIS — R41842 Visuospatial deficit: Secondary | ICD-10-CM

## 2024-06-09 NOTE — Telephone Encounter (Signed)
 Dr. Seabron,  David Craig  was seen by OT on 06/09/2024.  I feel the patient could benefit from evaluation for Botox injections to manage LUE spasticity.    If you agree, please fax an order to [facility] [phone].   Thank you,  Jocelyn Bottom, OTR/L 06/09/2024 Occupational Therapy [facility] [address] [place], [place]  [address] Phone: [phone] Fax: [phone]

## 2024-06-09 NOTE — Therapy (Signed)
 " OUTPATIENT OCCUPATIONAL THERAPY TREATMENT  Patient Name: David Craig MRN: [id number] DOB:04-07-1986, 39 y.o., male Today's Date: 06/09/2024  PCP: Seabron Lenis, MD  REFERRING PROVIDER: Laurice Grice, OD  END OF SESSION:  OT End of Session - 06/09/24 0939     Visit Number 3    Number of Visits 17    Date for Recertification  07/29/24    Authorization Type [employer] Medicare    OT Start Time [phone]    OT Stop Time 1020    OT Time Calculation (min) 42 min    Activity Tolerance Patient tolerated treatment well    Behavior During Therapy [facility] for tasks assessed/performed          No past medical history on file. Past Surgical History:  Procedure Laterality Date   BUBBLE STUDY  02/21/2021   Procedure: BUBBLE STUDY;  Surgeon: Raford Riggs, MD;  Location: [facility] ENDOSCOPY;  Service: Cardiovascular;;   IR ANGIO INTRA EXTRACRAN SEL COM CAROTID INNOMINATE UNI L MOD SED  02/15/2021   IR ANGIO VERTEBRAL SEL VERTEBRAL BILAT MOD SED  02/15/2021   IR CT HEAD LTD  02/15/2021   IR PERCUTANEOUS ART THROMBECTOMY/INFUSION INTRACRANIAL INC DIAG ANGIO  02/15/2021   IR US  GUIDE VASC ACCESS RIGHT  02/15/2021   RADIOLOGY WITH ANESTHESIA N/A 02/14/2021   Procedure: IR WITH ANESTHESIA;  Surgeon: Dolphus Carrion, MD;  Location: [facility] OR;  Service: Radiology;  Laterality: N/A;   TEE WITHOUT CARDIOVERSION N/A 02/21/2021   Procedure: TRANSESOPHAGEAL ECHOCARDIOGRAM (TEE);  Surgeon: Raford Riggs, MD;  Location: [facility] ENDOSCOPY;  Service: Cardiovascular;  Laterality: N/A;   Patient Active Problem List   Diagnosis Date Noted   Spastic hemiparesis of left nondominant side (HCC) 07/03/2022   Acquired subluxation of left shoulder 06/13/2021   Homonymous hemianopsia, left 06/13/2021   Sprain of left ankle    Sleep disturbance    Hyperglycemia    Hypoalbuminemia due to protein-calorie malnutrition    Dyslipidemia    Hemiparesis affecting left side as late effect of stroke (HCC)    Lethargy    Cerebrovascular accident  (CVA) due to occlusion of right posterior cerebral artery (HCC) 02/22/2021   Cerebrovascular accident (CVA) (HCC)    Right carotid artery occlusion 02/14/2021    ONSET DATE: 04/05/2024 (Date of referral)  REFERRING DIAG: History of stroke with residual deficit (I69.30)   THERAPY DIAG:  Stiffness of left wrist, not elsewhere classified  Stiffness of left hand, not elsewhere classified  Muscle weakness (generalized)  Other symptoms and signs involving the nervous system  Visuospatial deficit  Other symptoms and signs involving the musculoskeletal system  Hemiplegia and hemiparesis following other cerebrovascular disease affecting left non-dominant side (HCC)  Rationale for Evaluation and Treatment: Rehabilitation  SUBJECTIVE:   SUBJECTIVE STATEMENT: Pt brought in braces today. He reports he has not been able to don resting hand splint or neuro glove independently and he does not have someone at home to assist. His SoftPro resting hand splint is no longer snug.  Pt accompanied by: self  PERTINENT HISTORY: CVA, L side hemiparesis, hyperglycemia, L shoulder subluxation, left homonymous hemianopsia, Spastic hemiparesis of left nondominant side    PAIN:  Are you having pain? No  FALLS: Has patient fallen in last 6 months? No  LIVING ENVIRONMENT: Lives with: lives alone Lives in: House/apartment Stairs: Yes: External: 5 steps; on left going up and one set of B rails Has following equipment at home: L AFO  PLOF: Independent; on disability; taking care of kids  PATIENT  GOALS: to maintain level of function, better manage health  OBJECTIVE:   HAND DOMINANCE: Right  ADLs and IADLs: Overall ADLs and IADLs: mod I (uses rocker knife to cut food)  MOBILITY STATUS: Independent  FUNCTIONAL OUTCOME MEASURES: TBD  COGNITION: Overall cognitive status: Within functional limits for tasks assessed  VISUAL FINDINGS: L homonymous hemianopsia Baseline vision: does not wear  glasses, has not had an eye exam in years Visual history: none  Impaired - able to read clock on wall and text on paper printout  OBSERVATIONS: Pt ambulates with use of L AFO. No loss of balance though unsteady gait. The pt appears well kept and has ball cap donned. L facial droop (chronic presentation)  TODAY'S TREATMENT:                                                                                                                               Instructed pt in donning and doffing of resting hand splint. Pt required min cues to don and no cues for doffing. Educated pt on washing splint for future wear until he can obtain better fitting brace.   Trained pt in donning and doffing the Neuroglove including use of keyhole tabs in finger sleeves to pull over and down each finger starting with the pinky. Emphasized need to relax, take deep breaths, and not to rush with donning to manage spasticity. Pt required min assist. Educated pt on folding hook side on itself to minimize getting in the way when donning and doffing.   Provided instruction on how to obtain a resting hand splint including insurance coverage considerations and out of pocket options to obtain. Recommended RMI L resting hand splint size large.   Discussed role of Botox injections for management of LUE spasticity post-CVA. Educated pt on typical process: obtaining referral from PCP and expected outcomes (tone reduction, improved functional positioning, improved tolerance for splinting. Provided instructions for patient to contact PCP to request referral to PM&R; offered to send recommendation to provider additionally.  PATIENT EDUCATION: Education details: Spasticity management Person educated: Patient Education method: Explanation, Demonstration, Tactile cues, Verbal cues, and written reminders Education comprehension: verbalized understanding, returned demonstration, verbal cues required, tactile cues required, and needs further  education  HOME EXERCISE PROGRAM: 05/20/2024: vision strategies; spasticity management; health management  GOALS:  SHORT TERM GOALS: Target date: 06/17/2024    Patient will independently recall at least 2 compensatory strategies for visual impairment without cueing. Baseline: New to outpt OT  Goal status: INITIAL  2.  Patient will return demonstration of visual adaptation use and verbalize understanding of how to obtain item(s) if desired.  Baseline: New to outpt OT  Goal status: INITIAL  3.  Patient will independently verbalize at least 3 stress reduction techniques for symptom management as needed for improved participation in occupations.  Baseline: New to outpt OT  Goal status: INITIAL  4.   Patient will independently recall 3 sleep hygiene strategies to improve the  quality, duration, and consistency of sleep, which supports mental, emotional, and physical health.  Baseline: New to outpt OT  Goal status: INITIAL  5.  Pt will independently recall at least 3 ways to manage spasticity in RUE as needed to improve pain and overall function.   Baseline: New to outpt OT  Goal status: IN PROGRESS   LONG TERM GOALS: Target date: 07/29/2024    Will set appropriate outcome measure based goal. Baseline: TBD Goal status: INITIAL  2.  Patient will demonstrate independent use of scanning and head-turning strategies during mobility and ADLs to compensate for visual field deficits in 90% of observed opportunities. Baseline:  Goal status: INITIAL  ASSESSMENT:  CLINICAL IMPRESSION: Patient is a 39 y.o. y.o. male who was seen today for occupational therapy treatment to address chronic LUE deficits following CVA.  Continued skilled OT is needed to improve independence with orthotic use, splint management, safety, and spasticity-impacting functional tasks. Recommend Botox consultation to support improved UE functional mobility and splint tolerance. Future sessions to focus on splint  training, tone management strategies, ROM, and functional integration of LUE.  PERFORMANCE DEFICITS: in functional skills including ADLs, IADLs, coordination, tone, ROM, strength, muscle spasms, Fine motor control, Gross motor control, mobility, decreased knowledge of precautions, decreased knowledge of use of DME, vision, and UE functional use.   IMPAIRMENTS: are limiting patient from ADLs, IADLs, rest and sleep, and leisure.   CO-MORBIDITIES: may have co-morbidities  that affects occupational performance. Patient will benefit from skilled OT to address above impairments and improve overall function.  REHAB POTENTIAL: Fair given chronicity of CVA  PLAN:  OT FREQUENCY: 1-2x/week  OT DURATION: 8 weeks  PLANNED INTERVENTIONS: self care/ADL training, therapeutic exercise, therapeutic activity, functional mobility training, patient/family education, visual/perceptual remediation/compensation, coping strategies training, DME and/or AE instructions, and Re-evaluation  RECOMMENDED OTHER SERVICES: None at this time   CONSULTED AND AGREED WITH PLAN OF CARE: Patient   PLAN FOR NEXT SESSION: Set outcome measure based goal - PSFS   Did he order RMI resting hand splint?  Was he able to use Neuro glove (?) Did he get order for [facility] for Botox?  Go over general vision packet and vision strategies; initiate visual activities  Needs printed HEP ideas  Check BP; sleep hygiene; stress reduction techniques    Ischemic CVA - lacks AROM for Vivistim, needs management of spasticity for Myomo  Jocelyn CHRISTELLA Bottom, OT 06/09/2024, 10:47 AM          "

## 2024-06-14 ENCOUNTER — Ambulatory Visit: Admitting: Occupational Therapy

## 2024-06-14 DIAGNOSIS — R29818 Other symptoms and signs involving the nervous system: Secondary | ICD-10-CM

## 2024-06-14 DIAGNOSIS — I69854 Hemiplegia and hemiparesis following other cerebrovascular disease affecting left non-dominant side: Secondary | ICD-10-CM

## 2024-06-14 DIAGNOSIS — M25642 Stiffness of left hand, not elsewhere classified: Secondary | ICD-10-CM

## 2024-06-14 DIAGNOSIS — M6281 Muscle weakness (generalized): Secondary | ICD-10-CM

## 2024-06-14 DIAGNOSIS — M25632 Stiffness of left wrist, not elsewhere classified: Secondary | ICD-10-CM

## 2024-06-14 NOTE — Therapy (Unsigned)
 " OUTPATIENT OCCUPATIONAL THERAPY TREATMENT  Patient Name: David Craig MRN: [id number] DOB:12-Feb-1986, 39 y.o., male Today's Date: 06/14/2024  PCP: David Lenis, MD  REFERRING PROVIDER: Laurice Craig, OD  END OF SESSION:  OT End of Session - 06/14/24 1231     Visit Number 4    Number of Visits 17    Date for Recertification  07/29/24    Authorization Type [employer]    OT Start Time 1232    OT Stop Time 1315    OT Time Calculation (min) 43 min    Activity Tolerance Patient tolerated treatment well    Behavior During Therapy David Craig for tasks assessed/performed          No past medical history on file. Past Surgical History:  Procedure Laterality Date   BUBBLE STUDY  02/21/2021   Procedure: BUBBLE STUDY;  Surgeon: Raford Riggs, MD;  Location: [facility] ENDOSCOPY;  Service: Cardiovascular;;   IR ANGIO INTRA EXTRACRAN SEL COM CAROTID INNOMINATE UNI L MOD SED  02/15/2021   IR ANGIO VERTEBRAL SEL VERTEBRAL BILAT MOD SED  02/15/2021   IR CT HEAD LTD  02/15/2021   IR PERCUTANEOUS ART THROMBECTOMY/INFUSION INTRACRANIAL INC DIAG ANGIO  02/15/2021   IR US  GUIDE VASC ACCESS RIGHT  02/15/2021   RADIOLOGY WITH ANESTHESIA N/A 02/14/2021   Procedure: IR WITH ANESTHESIA;  Surgeon: Dolphus Carrion, MD;  Location: [facility] OR;  Service: Radiology;  Laterality: N/A;   TEE WITHOUT CARDIOVERSION N/A 02/21/2021   Procedure: TRANSESOPHAGEAL ECHOCARDIOGRAM (TEE);  Surgeon: Raford Riggs, MD;  Location: [facility] ENDOSCOPY;  Service: Cardiovascular;  Laterality: N/A;   Patient Active Problem List   Diagnosis Date Noted   Spastic hemiparesis of left nondominant side (HCC) 07/03/2022   Acquired subluxation of left shoulder 06/13/2021   Homonymous hemianopsia, left 06/13/2021   Sprain of left ankle    Sleep disturbance    Hyperglycemia    Hypoalbuminemia due to protein-calorie malnutrition    Dyslipidemia    Hemiparesis affecting left side as late effect of stroke (HCC)    Lethargy    Cerebrovascular  accident (CVA) due to occlusion of right posterior cerebral artery (HCC) 02/22/2021   Cerebrovascular accident (CVA) (HCC)    Right carotid artery occlusion 02/14/2021    ONSET DATE: 04/05/2024 (Date of referral)  REFERRING DIAG: History of stroke with residual deficit (I69.30)   THERAPY DIAG:  Stiffness of left wrist, not elsewhere classified  Stiffness of left hand, not elsewhere classified  Other symptoms and signs involving the nervous system  Muscle weakness (generalized)  Hemiplegia and hemiparesis following other cerebrovascular disease affecting left non-dominant side (HCC)  Rationale for Evaluation and Treatment: Rehabilitation  SUBJECTIVE:   SUBJECTIVE STATEMENT: Pt did not bring his splint today and continues to report difficulty with application.  He did try the neuro glove at least once since his last visit though but still has trouble getting it on snuggly.  Pt uncertain how many finger straps are on his device to help with application.  Pt accompanied by: self  PERTINENT HISTORY: CVA, L side hemiparesis, hyperglycemia, L shoulder subluxation, left homonymous hemianopsia, Spastic hemiparesis of left nondominant side    PAIN:  Are you having pain? No  FALLS: Has patient fallen in last 6 months? No  LIVING ENVIRONMENT: Lives with: lives alone Lives in: House/apartment Stairs: Yes: External: 5 steps; on left going up and one set of B rails Has following equipment at home: L AFO  PLOF: Independent; on disability; taking care of kids  PATIENT  GOALS: to maintain level of function, better manage health  OBJECTIVE:   HAND DOMINANCE: Right  ADLs and IADLs: Overall ADLs and IADLs: mod I (uses rocker knife to cut food)  MOBILITY STATUS: Independent  FUNCTIONAL OUTCOME MEASURES: TBD  COGNITION: Overall cognitive status: Within functional limits for tasks assessed  VISUAL FINDINGS: L homonymous hemianopsia Baseline vision: does not wear glasses, has not  had an eye exam in years Visual history: none  Impaired - able to read clock on wall and text on paper printout  OBSERVATIONS: Pt ambulates with use of L AFO. No loss of balance though unsteady gait. The pt appears well kept and has ball cap donned. L facial droop (chronic presentation)  TODAY'S TREATMENT:                                                                                                                               - Orthotic management: Pt trialled donning and doffing a large T-bar hand splint with only 2 straps (forearm and dorsal hand) with some success with application.  Fairly neutral wrist position able to be achieved but wrist does have ability to slip out of place and pt could use extra finger extension.   Pt encouraged to bring splints regularly to practice with OT staff, along with Neuro glove.  Options to consider are T-bar with large roll but thumb flexes or resting hand with rolled finger pan but extra straps are difficult for pt to manage. He would benefit from further training and trials with different splints to determine the easiest one for self-application.   - Neuro re-education completed for duration as noted below including: Neuromuscular reeducation provided, including specific visual, tactile and verbal cues and physical assistance to stimulate the neuromuscular system and promote functional L digital ROM, movement and use of digits for grasp and release motions.  Pt had significantly improved active movement noted with squeezing and relaxing today as he was able to repeatedly push his medial 2 digits into more extension.  He need extra help with open index/middle finger but repeatedly verbalized that this was the most movement he has seen in his hand/fingers.  PATIENT EDUCATION: Education details: Spasticity management with splint and ROM of LUE Person educated: Patient Education method: Explanation, Demonstration, Tactile cues, and Verbal cues Education  comprehension: verbalized understanding, returned demonstration, verbal cues required, tactile cues required, and needs further education  HOME EXERCISE PROGRAM: 05/20/2024: vision strategies; spasticity management; health management  GOALS:  SHORT TERM GOALS: Target date: 06/17/2024    Patient will independently recall at least 2 compensatory strategies for visual impairment without cueing. Baseline: New to outpt OT  Goal status: INITIAL  2.  Patient will return demonstration of visual adaptation use and verbalize understanding of how to obtain item(s) if desired.  Baseline: New to outpt OT  Goal status: INITIAL  3.  Patient will independently verbalize at least 3 stress reduction techniques for symptom management as needed for  improved participation in occupations.  Baseline: New to outpt OT  Goal status: INITIAL  4.   Patient will independently recall 3 sleep hygiene strategies to improve the quality, duration, and consistency of sleep, which supports mental, emotional, and physical health.  Baseline: New to outpt OT  Goal status: INITIAL  5.  Pt will independently recall at least 3 ways to manage spasticity in RUE as needed to improve pain and overall function.   Baseline: New to outpt OT  Goal status: IN PROGRESS   LONG TERM GOALS: Target date: 07/29/2024    Will set appropriate outcome measure based goal. Baseline: TBD Goal status: INITIAL  2.  Patient will demonstrate independent use of scanning and head-turning strategies during mobility and ADLs to compensate for visual field deficits in 90% of observed opportunities. Baseline:  Goal status: INITIAL  ASSESSMENT:  CLINICAL IMPRESSION: Patient is a 39 y.o. y.o. male who was seen today for occupational therapy treatment to address chronic LUE deficits following CVA.  Continued skilled OT is needed to improve independence with orthotic use, splint management, safety, and spasticity-impacting functional tasks.  Pt was  pleased with some purposeful movement sin hand/digits ie) able to squeeze, relax and open at least 2/4 digits between 25-50% of motion.  Future sessions to continue focus on splint training, tone management strategies, ROM, and functional integration of LUE.  PERFORMANCE DEFICITS: in functional skills including ADLs, IADLs, coordination, tone, ROM, strength, muscle spasms, Fine motor control, Gross motor control, mobility, decreased knowledge of precautions, decreased knowledge of use of DME, vision, and UE functional use.   IMPAIRMENTS: are limiting patient from ADLs, IADLs, rest and sleep, and leisure.   CO-MORBIDITIES: may have co-morbidities  that affects occupational performance. Patient will benefit from skilled OT to address above impairments and improve overall function.  REHAB POTENTIAL: Fair given chronicity of CVA  PLAN:  OT FREQUENCY: 1-2x/week  OT DURATION: 8 weeks  PLANNED INTERVENTIONS: self care/ADL training, therapeutic exercise, therapeutic activity, functional mobility training, patient/family education, visual/perceptual remediation/compensation, coping strategies training, DME and/or AE instructions, and Re-evaluation  RECOMMENDED OTHER SERVICES: None at this time   CONSULTED AND AGREED WITH PLAN OF CARE: Patient   PLAN FOR NEXT SESSION:   Set outcome measure based goal - PSFS   Vision Goals, sleep and stress reduction  Did he order RMI resting hand splint?  Was he able to use Neuro glove (?) Did he get order for [facility] for Botox?  Go over general vision packet and vision strategies; initiate visual activities  Needs printed HEP ideas  Check BP; sleep hygiene; stress reduction techniques    Ischemic CVA - lacks AROM for Vivistim, needs management of spasticity for Myomo  David Craig, OT 06/14/2024, 5:01 PM          "

## 2024-06-16 ENCOUNTER — Ambulatory Visit: Admitting: Occupational Therapy

## 2024-06-16 DIAGNOSIS — R29818 Other symptoms and signs involving the nervous system: Secondary | ICD-10-CM

## 2024-06-16 DIAGNOSIS — M6281 Muscle weakness (generalized): Secondary | ICD-10-CM

## 2024-06-16 DIAGNOSIS — M25642 Stiffness of left hand, not elsewhere classified: Secondary | ICD-10-CM

## 2024-06-16 DIAGNOSIS — R29898 Other symptoms and signs involving the musculoskeletal system: Secondary | ICD-10-CM

## 2024-06-16 DIAGNOSIS — M25632 Stiffness of left wrist, not elsewhere classified: Secondary | ICD-10-CM

## 2024-06-16 DIAGNOSIS — R41842 Visuospatial deficit: Secondary | ICD-10-CM

## 2024-06-16 DIAGNOSIS — I69854 Hemiplegia and hemiparesis following other cerebrovascular disease affecting left non-dominant side: Secondary | ICD-10-CM

## 2024-06-16 NOTE — Therapy (Signed)
 " OUTPATIENT OCCUPATIONAL THERAPY TREATMENT  Patient Name: David Craig MRN: [id number] DOB:05-10-1986, 39 y.o., male Today's Date: 06/16/2024  PCP: Seabron Lenis, MD  REFERRING PROVIDER: Laurice Grice, OD  END OF SESSION:  OT End of Session - 06/16/24 1024     Visit Number 5    Number of Visits 17    Date for Recertification  07/29/24    Authorization Type [employer] Medicare    OT Start Time 1022    OT Stop Time 1100    OT Time Calculation (min) 38 min    Activity Tolerance Patient tolerated treatment well    Behavior During Therapy WFL for tasks assessed/performed          No past medical history on file. Past Surgical History:  Procedure Laterality Date   BUBBLE STUDY  02/21/2021   Procedure: BUBBLE STUDY;  Surgeon: Raford Riggs, MD;  Location: [facility] ENDOSCOPY;  Service: Cardiovascular;;   IR ANGIO INTRA EXTRACRAN SEL COM CAROTID INNOMINATE UNI L MOD SED  02/15/2021   IR ANGIO VERTEBRAL SEL VERTEBRAL BILAT MOD SED  02/15/2021   IR CT HEAD LTD  02/15/2021   IR PERCUTANEOUS ART THROMBECTOMY/INFUSION INTRACRANIAL INC DIAG ANGIO  02/15/2021   IR US  GUIDE VASC ACCESS RIGHT  02/15/2021   RADIOLOGY WITH ANESTHESIA N/A 02/14/2021   Procedure: IR WITH ANESTHESIA;  Surgeon: Dolphus Carrion, MD;  Location: [facility] OR;  Service: Radiology;  Laterality: N/A;   TEE WITHOUT CARDIOVERSION N/A 02/21/2021   Procedure: TRANSESOPHAGEAL ECHOCARDIOGRAM (TEE);  Surgeon: Raford Riggs, MD;  Location: [facility] ENDOSCOPY;  Service: Cardiovascular;  Laterality: N/A;   Patient Active Problem List   Diagnosis Date Noted   Spastic hemiparesis of left nondominant side (HCC) 07/03/2022   Acquired subluxation of left shoulder 06/13/2021   Homonymous hemianopsia, left 06/13/2021   Sprain of left ankle    Sleep disturbance    Hyperglycemia    Hypoalbuminemia due to protein-calorie malnutrition    Dyslipidemia    Hemiparesis affecting left side as late effect of stroke (HCC)    Lethargy    Cerebrovascular  accident (CVA) due to occlusion of right posterior cerebral artery (HCC) 02/22/2021   Cerebrovascular accident (CVA) (HCC)    Right carotid artery occlusion 02/14/2021    ONSET DATE: 04/05/2024 (Date of referral)  REFERRING DIAG: History of stroke with residual deficit (I69.30)   THERAPY DIAG:  Stiffness of left wrist, not elsewhere classified  Stiffness of left hand, not elsewhere classified  Other symptoms and signs involving the nervous system  Muscle weakness (generalized)  Hemiplegia and hemiparesis following other cerebrovascular disease affecting left non-dominant side (HCC)  Visuospatial deficit  Other symptoms and signs involving the musculoskeletal system  Rationale for Evaluation and Treatment: Rehabilitation  SUBJECTIVE:   SUBJECTIVE STATEMENT: Pt did not wash the shell of his resting hand splint. He was able to charge the neuro glove at home but has not been able to don it independently.  Pt accompanied by: self  PERTINENT HISTORY: CVA, L side hemiparesis, hyperglycemia, L shoulder subluxation, left homonymous hemianopsia, Spastic hemiparesis of left nondominant side    PAIN:  Are you having pain? No  FALLS: Has patient fallen in last 6 months? No  LIVING ENVIRONMENT: Lives with: lives alone Lives in: House/apartment Stairs: Yes: External: 5 steps; on left going up and one set of B rails Has following equipment at home: L AFO  PLOF: Independent; on disability; taking care of kids  PATIENT GOALS: to maintain level of function, better manage health  OBJECTIVE:   HAND DOMINANCE: Right  ADLs and IADLs: Overall ADLs and IADLs: mod I (uses rocker knife to cut food)  MOBILITY STATUS: Independent  FUNCTIONAL OUTCOME MEASURES: 06/16/2024: total score 0   COGNITION: Overall cognitive status: Within functional limits for tasks assessed  VISUAL FINDINGS: L homonymous hemianopsia Baseline vision: does not wear glasses, has not had an eye exam in  years Visual history: none  Impaired - able to read clock on wall and text on paper printout  OBSERVATIONS: Pt ambulates with use of L AFO. No loss of balance though unsteady gait. The pt appears well kept and has ball cap donned. L facial droop (chronic presentation)  TODAY'S TREATMENT:                                                                                                                               - Self-care/home management completed for duration as noted below including: Objective measures assessed as noted in Goals section to determine progression towards goals. Pt completed donning and doffing of large T-bar hand splint with only 2 straps (forearm and dorsal hand).  Options to consider are adjustment of SoftPro resting hand splint to allow better fit with worn out straps by adding strip of velcro loop vs purchasing new RMI once finances are in place. He would benefit from further training and trials with different splints to determine the easiest one for self-application.   Pt completing donning and doffing of neuro glove with min A  and cueing from therapist. Pt had brought in base and charger, but unfortunately, unable to turn device on. Discussed options for customer support of current glove as well as replacement options.   PATIENT EDUCATION: Education details: Spasticity management with splint and neuro glove; PSFS scoring Person educated: Patient Education method: Explanation, Demonstration, Tactile cues, and Verbal cues Education comprehension: verbalized understanding, returned demonstration, verbal cues required, tactile cues required, and needs further education  HOME EXERCISE PROGRAM: 05/20/2024: vision strategies; spasticity management; health management  GOALS:  SHORT TERM GOALS: Target date: 06/17/2024    Patient will independently recall at least 2 compensatory strategies for visual impairment without cueing. Baseline: New to outpt OT  Goal status:  INITIAL  2.  Patient will return demonstration of visual adaptation use and verbalize understanding of how to obtain item(s) if desired.  Baseline: New to outpt OT  Goal status: INITIAL  3.  Patient will independently verbalize at least 3 stress reduction techniques for symptom management as needed for improved participation in occupations.  Baseline: New to outpt OT  Goal status: INITIAL  4.   Patient will independently recall 3 sleep hygiene strategies to improve the quality, duration, and consistency of sleep, which supports mental, emotional, and physical health.  Baseline: New to outpt OT  Goal status: INITIAL  5.  Pt will independently recall at least 3 ways to manage spasticity in RUE as needed to improve pain and overall function.  Baseline: New to outpt OT  Goal status: IN PROGRESS   LONG TERM GOALS: Target date: 07/29/2024    Patient will report at least two-point increase in average PSFS score or at least three-point increase in a single activity score indicating functionally significant improvement given minimum detectable change.  Baseline: 0 total score (See above for individual activity scores)  Goal status: INITIAL  2.  Patient will demonstrate independent use of scanning and head-turning strategies during mobility and ADLs to compensate for visual field deficits in 90% of observed opportunities. Baseline:  Goal status: INITIAL  ASSESSMENT:  CLINICAL IMPRESSION: Patient is a 39 y.o. y.o. male who was seen today for occupational therapy treatment to address chronic LUE deficits following CVA.  Continued skilled OT is needed to improve independence with orthotic use, splint management, safety, and spasticity-impacting functional tasks.  Progress towards goals.  PERFORMANCE DEFICITS: in functional skills including ADLs, IADLs, coordination, tone, ROM, strength, muscle spasms, Fine motor control, Gross motor control, mobility, decreased knowledge of precautions,  decreased knowledge of use of DME, vision, and UE functional use.   IMPAIRMENTS: are limiting patient from ADLs, IADLs, rest and sleep, and leisure.   CO-MORBIDITIES: may have co-morbidities  that affects occupational performance. Patient will benefit from skilled OT to address above impairments and improve overall function.  REHAB POTENTIAL: Fair given chronicity of CVA  PLAN:  OT FREQUENCY: 1-2x/week  OT DURATION: 8 weeks  PLANNED INTERVENTIONS: self care/ADL training, therapeutic exercise, therapeutic activity, functional mobility training, patient/family education, visual/perceptual remediation/compensation, coping strategies training, DME and/or AE instructions, and Re-evaluation  RECOMMENDED OTHER SERVICES: None at this time   CONSULTED AND AGREED WITH PLAN OF CARE: Patient   PLAN FOR NEXT SESSION:   Vision Goals, sleep and stress reduction  Did he order RMI resting hand splint?  Was he able to use Neuro glove (?) Did he get order for [facility] for Botox?  Go over general vision packet and vision strategies; initiate visual activities  Needs printed HEP ideas  Check BP; sleep hygiene; stress reduction techniques    Ischemic CVA - lacks AROM for Vivistim, needs management of spasticity for Myomo  Jocelyn CHRISTELLA Bottom, OT 06/16/2024, 10:26 AM          "

## 2024-06-16 NOTE — Addendum Note (Signed)
 Addended by: MANNY JOCELYN HERO on: 06/16/2024 10:30 AM   Modules accepted: Orders

## 2024-06-21 ENCOUNTER — Ambulatory Visit: Admitting: Occupational Therapy

## 2024-06-21 DIAGNOSIS — R29818 Other symptoms and signs involving the nervous system: Secondary | ICD-10-CM

## 2024-06-21 DIAGNOSIS — M6281 Muscle weakness (generalized): Secondary | ICD-10-CM

## 2024-06-21 DIAGNOSIS — I69854 Hemiplegia and hemiparesis following other cerebrovascular disease affecting left non-dominant side: Secondary | ICD-10-CM

## 2024-06-21 DIAGNOSIS — R41842 Visuospatial deficit: Secondary | ICD-10-CM

## 2024-06-21 DIAGNOSIS — M25632 Stiffness of left wrist, not elsewhere classified: Secondary | ICD-10-CM

## 2024-06-21 DIAGNOSIS — M25642 Stiffness of left hand, not elsewhere classified: Secondary | ICD-10-CM

## 2024-06-21 NOTE — Therapy (Signed)
 " OUTPATIENT OCCUPATIONAL THERAPY TREATMENT  Patient Name: David Craig MRN: [id number] DOB:08/22/1985, 39 y.o., male Today's Date: 06/21/2024  PCP: Seabron Lenis, MD  REFERRING PROVIDER: Laurice Grice, OD  END OF SESSION:  OT End of Session - 06/21/24 1233     Visit Number 6    Number of Visits 17    Date for Recertification  07/29/24    Authorization Type [employer] Medicare    OT Start Time 1233    OT Stop Time 1330    OT Time Calculation (min) 57 min    Equipment Utilized During Treatment Personal splint    Activity Tolerance Patient tolerated treatment well    Behavior During Therapy [facility] for tasks assessed/performed          No past medical history on file. Past Surgical History:  Procedure Laterality Date   BUBBLE STUDY  02/21/2021   Procedure: BUBBLE STUDY;  Surgeon: Raford Riggs, MD;  Location: [facility] ENDOSCOPY;  Service: Cardiovascular;;   IR ANGIO INTRA EXTRACRAN SEL COM CAROTID INNOMINATE UNI L MOD SED  02/15/2021   IR ANGIO VERTEBRAL SEL VERTEBRAL BILAT MOD SED  02/15/2021   IR CT HEAD LTD  02/15/2021   IR PERCUTANEOUS ART THROMBECTOMY/INFUSION INTRACRANIAL INC DIAG ANGIO  02/15/2021   IR US  GUIDE VASC ACCESS RIGHT  02/15/2021   RADIOLOGY WITH ANESTHESIA N/A 02/14/2021   Procedure: IR WITH ANESTHESIA;  Surgeon: Dolphus Carrion, MD;  Location: [facility] OR;  Service: Radiology;  Laterality: N/A;   TEE WITHOUT CARDIOVERSION N/A 02/21/2021   Procedure: TRANSESOPHAGEAL ECHOCARDIOGRAM (TEE);  Surgeon: Raford Riggs, MD;  Location: [facility] ENDOSCOPY;  Service: Cardiovascular;  Laterality: N/A;   Patient Active Problem List   Diagnosis Date Noted   Spastic hemiparesis of left nondominant side (HCC) 07/03/2022   Acquired subluxation of left shoulder 06/13/2021   Homonymous hemianopsia, left 06/13/2021   Sprain of left ankle    Sleep disturbance    Hyperglycemia    Hypoalbuminemia due to protein-calorie malnutrition    Dyslipidemia    Hemiparesis affecting left side as late effect  of stroke (HCC)    Lethargy    Cerebrovascular accident (CVA) due to occlusion of right posterior cerebral artery (HCC) 02/22/2021   Cerebrovascular accident (CVA) (HCC)    Right carotid artery occlusion 02/14/2021    ONSET DATE: 04/05/2024 (Date of referral)  REFERRING DIAG: History of stroke with residual deficit (I69.30)   THERAPY DIAG:  Stiffness of left wrist, not elsewhere classified  Stiffness of left hand, not elsewhere classified  Other symptoms and signs involving the nervous system  Muscle weakness (generalized)  Hemiplegia and hemiparesis following other cerebrovascular disease affecting left non-dominant side (HCC)  Visuospatial deficit  Rationale for Evaluation and Treatment: Rehabilitation  SUBJECTIVE:   SUBJECTIVE STATEMENT: Pt brought his new splint and neuro glove back today. He reports he was able to use the neuro glove at home one time but it has not maintained the charge.    Pt accompanied by: self  PERTINENT HISTORY: CVA, L side hemiparesis, hyperglycemia, L shoulder subluxation, left homonymous hemianopsia, Spastic hemiparesis of left nondominant side    PAIN:  Are you having pain? No  FALLS: Has patient fallen in last 6 months? No  LIVING ENVIRONMENT: Lives with: lives alone Lives in: House/apartment Stairs: Yes: External: 5 steps; on left going up and one set of B rails Has following equipment at home: L AFO  PLOF: Independent; on disability; taking care of kids  PATIENT GOALS: to maintain level of function, better  manage health  OBJECTIVE:   HAND DOMINANCE: Right  ADLs and IADLs: Overall ADLs and IADLs: mod I (uses rocker knife to cut food)  MOBILITY STATUS: Independent  FUNCTIONAL OUTCOME MEASURES: 06/16/2024: total score 0   COGNITION: Overall cognitive status: Within functional limits for tasks assessed  VISUAL FINDINGS: L homonymous hemianopsia Baseline vision: does not wear glasses, has not had an eye exam in  years Visual history: none  Impaired - able to read clock on wall and text on paper printout  OBSERVATIONS: Pt ambulates with use of L AFO. No loss of balance though unsteady gait. The pt appears well kept and has ball cap donned. L facial droop (chronic presentation)  TODAY'S TREATMENT:                                                                                                                               - Orthotic fit and training completed for duration as noted below including: Pt seen for orthotic fit and training with new RMI resting hand splint pt obtained.  OT fit pt with splint, educated in use of finger separator and management of straps.  Pre-fabricated splint does allow separation of each individual finger and is adjustable given pt's joint changes and available ROM. Instructed pt on proper application ie) wrist and dorsal hand straps to remain fastened together and pulled across hand first followed by forearm strap and then adjust digits on the finger pan and finally position the thumb straps. OT also sewed additional velcro hook to his old splint and he was also able to apply this splint using similar techniques but it is noted that he needs additional velcro along the straps to full adhere straps in place.  Pt encouraged to loosely fasten straps first and then refasten then once he has put his fingers on the pan comfortably. - Self-care/home management completed for duration as noted below including: Attempted to use [employer] glove but pt's charger did not seem to maintain a charge and pt is going to look for additional charger at home.  OT does feel pt needs additional finger straps to hold it in place and assist with application. Also began to explore visual issues with pt who has quite a significant L visual field deficit when looking forward.  He is aware of need to scan to his L ie) when driving and chooses not to drive with his children or at night.  Discussed possibly  placing a distinct colored tape/marker in his blind spot to ensure full turing to his L side - will explore in future session.  OT educated pt on key sleep hygiene strategies as noted in pt instructions, including: Maintaining a consistent sleep/wake schedule Creating a quiet, dark, and cool sleep environment Avoiding screens and stimulants before bedtime Making sleep environment comfortable Managing food and fluid intake to reduce nighttime awakenings Getting regular physical activity/limiting naps Reducing bad sleep habits while incorporating better sleep habits Tips for relaxation  When to seek additional help Discussed stroke-specific considerations (e.g., increased sleep needs, safety with nighttime mobility)   PATIENT EDUCATION: Education details: Spasticity management with splint application; Sleep Hygiene Person educated: Patient Education method: Explanation, Demonstration, Tactile cues, and Verbal cues Education comprehension: verbalized understanding, returned demonstration, verbal cues required, tactile cues required, and needs further education  HOME EXERCISE PROGRAM: 05/20/2024: vision strategies; spasticity management; health management 06/21/24: Sleep Hygiene  GOALS:  SHORT TERM GOALS: Target date: 06/17/2024    Patient will independently recall at least 2 compensatory strategies for visual impairment without cueing. Baseline: New to outpt OT  Goal status: IN PROGRESS  2.  Patient will return demonstration of visual adaptation use and verbalize understanding of how to obtain item(s) if desired.  Baseline: New to outpt OT  Goal status: INITIAL  3.  Patient will independently verbalize at least 3 stress reduction techniques for symptom management as needed for improved participation in occupations.  Baseline: New to outpt OT  Goal status: IN PROGRESS  4.   Patient will independently recall 3 sleep hygiene strategies to improve the quality, duration, and consistency  of sleep, which supports mental, emotional, and physical health.  Baseline: New to outpt OT  Goal status: IN PROGRESS 06/21/24: handout provided; pt may need to explore alternate medication also  5.  Pt will independently recall at least 3 ways to manage spasticity in RUE as needed to improve pain and overall function.   Baseline: New to outpt OT  Goal status: IN PROGRESS 06/21/24: Splint and stretching   LONG TERM GOALS: Target date: 07/29/2024    Patient will report at least two-point increase in average PSFS score or at least three-point increase in a single activity score indicating functionally significant improvement given minimum detectable change.  Baseline: 0 total score (See above for individual activity scores)  Goal status: INITIAL  2.  Patient will demonstrate independent use of scanning and head-turning strategies during mobility and ADLs to compensate for visual field deficits in 90% of observed opportunities. Baseline:  Goal status: INITIAL  ASSESSMENT:  CLINICAL IMPRESSION: Patient is a 39 y.o. y.o. male who was seen today for occupational therapy treatment to address chronic LUE and visual deficits following CVA.  Pt had good success with donning his new RMI resting hand splint with multiple trials to ensure improved carryover.  Also began to explore visual comp strategies.  Continued skilled OT is needed to improve independence with orthotic use, splint management, safety, and spasticity-impacting functional tasks as well as visual deficits as pt has not achieved any goals yet but is making progress towards goals.  PERFORMANCE DEFICITS: in functional skills including ADLs, IADLs, coordination, tone, ROM, strength, muscle spasms, Fine motor control, Gross motor control, mobility, decreased knowledge of precautions, decreased knowledge of use of DME, vision, and UE functional use.   IMPAIRMENTS: are limiting patient from ADLs, IADLs, rest and sleep, and leisure.    CO-MORBIDITIES: may have co-morbidities  that affects occupational performance. Patient will benefit from skilled OT to address above impairments and improve overall function.  REHAB POTENTIAL: Fair given chronicity of CVA  PLAN:  OT FREQUENCY: 1-2x/week  OT DURATION: 8 weeks  PLANNED INTERVENTIONS: self care/ADL training, therapeutic exercise, therapeutic activity, functional mobility training, patient/family education, visual/perceptual remediation/compensation, coping strategies training, DME and/or AE instructions, and Re-evaluation  RECOMMENDED OTHER SERVICES: None at this time   CONSULTED AND AGREED WITH PLAN OF CARE: Patient   PLAN FOR NEXT SESSION:   Vision Goals, sleep and stress reduction  Was he able find charging cord for Neuro glove (?) Did he get order for [facility] for Botox?  Go over general vision packet and vision strategies; initiate visual activities (scanning in office)  -discussed a visual cues for driving   Needs printed HEP ideas  Check BP; stress reduction techniques    Clarita LITTIE Pride, OT 06/21/2024, 1:39 PM          "

## 2024-06-21 NOTE — Patient Instructions (Signed)
??   Sleep Hygiene Handout ?? What Is Sleep Hygiene? Sleep hygiene refers to healthy habits and practices that help improve the quality, duration, and consistency of your sleep. Good sleep hygiene supports mental, emotional, and physical health.  ? Tips for Better Sleep Hygiene 1. Stick to a Consistent Sleep Schedule Go to bed and wake up at the same time every day--even on weekends. Helps regulate your body's internal clock. 2. Create a Relaxing Bedtime Routine Try calming activities before bed (e.g., reading, gentle stretching, meditation). Avoid stressful conversations or work tasks right before sleep. Take a bath or shower. 3. Limit Exposure to Light at Night Dim the lights in the evening. Avoid screens (phones, TVs, computers) at least 1 hour before bed. Use blue light filters if necessary. 4. Make Your Sleep Environment Comfortable Keep your bedroom dark, quiet, and cool  Use blackout curtains, white noise machines, or earplugs if needed. Invest in a comfortable mattress and pillows. Change your bedsheets and make your bed Practice proper sleep positioning Incorporate smells that you enjoy/help you relax like lavender, peppermint, etc. (lotion, bath/beauty products, essential oils/diffuser) 5. Watch What You Eat & Drink Avoid large meals, caffeine, and alcohol close to bedtime. Try to finish eating and drinking fluids at least 2-3 hours before bed.   6. Get Regular Physical Activity Exercise during the day can help you fall asleep faster and enjoy deeper sleep. (complete therapy exercises, walking, go to the gym) Avoid intense workouts late in the evening. 7. Limit Naps Keep naps short (20-30 minutes). Avoid napping late in the afternoon or evening.  ?? Habits That Interfere with Sleep Using your bed for work, watching TV, or eating. Staying in bed when you can't fall asleep (if you're awake for 20+ minutes, get up and do something relaxing in dim light).  ??? Bonus Tips  for Relaxation Try breathing exercises, progressive muscle relaxation, or guided meditation. Apps like Calm, Headspace, or Insight Timer can be helpful.  ?? When to Seek Help Consider talking to a doctor or sleep specialist if: You regularly have trouble falling or staying asleep. You snore loudly or gasp for air during sleep. You feel very sleepy during the day despite adequate sleep.  After a brain injury, sleep becomes especially important for brain recovery and overall health. Here's what the experts recommend: General Recommendation: Most adults are advised to get 7-9 hours of sleep per night. Post-Brain Injury Needs: These individuals often need more sleep than they did before injury, especially during the early stages of recovery.

## 2024-06-23 ENCOUNTER — Ambulatory Visit: Admitting: Occupational Therapy

## 2024-06-23 DIAGNOSIS — M6281 Muscle weakness (generalized): Secondary | ICD-10-CM

## 2024-06-23 DIAGNOSIS — M25642 Stiffness of left hand, not elsewhere classified: Secondary | ICD-10-CM

## 2024-06-23 DIAGNOSIS — I69854 Hemiplegia and hemiparesis following other cerebrovascular disease affecting left non-dominant side: Secondary | ICD-10-CM

## 2024-06-23 DIAGNOSIS — R41842 Visuospatial deficit: Secondary | ICD-10-CM

## 2024-06-23 DIAGNOSIS — R29898 Other symptoms and signs involving the musculoskeletal system: Secondary | ICD-10-CM

## 2024-06-23 DIAGNOSIS — M25632 Stiffness of left wrist, not elsewhere classified: Secondary | ICD-10-CM

## 2024-06-23 DIAGNOSIS — R29818 Other symptoms and signs involving the nervous system: Secondary | ICD-10-CM

## 2024-06-23 NOTE — Patient Instructions (Signed)
 [employer] [facility] - progressive muscle relaxation [email or web address]   [employer] [facility] - deep breathing [email or web address] Diaphragmatic breathing "belly breathing" 4-7-8 breathing (in-hold-out)   [facility] - guided imagery [email or web address]

## 2024-06-23 NOTE — Therapy (Signed)
 " OUTPATIENT OCCUPATIONAL THERAPY TREATMENT  Patient Name: David Craig MRN: [id number] DOB:Nov 25, 1985, 39 y.o., male Today's Date: 06/23/2024  PCP: Seabron Lenis, MD  REFERRING PROVIDER: Laurice Grice, OD  END OF SESSION:  OT End of Session - 06/23/24 1107     Visit Number 7    Number of Visits 17    Date for Recertification  07/29/24    Authorization Type [employer] Medicare    OT Start Time 1106    OT Stop Time 1145    OT Time Calculation (min) 39 min    Equipment Utilized During Treatment --    Activity Tolerance Patient tolerated treatment well    Behavior During Therapy [facility] for tasks assessed/performed          No past medical history on file. Past Surgical History:  Procedure Laterality Date   BUBBLE STUDY  02/21/2021   Procedure: BUBBLE STUDY;  Surgeon: Raford Riggs, MD;  Location: [facility] ENDOSCOPY;  Service: Cardiovascular;;   IR ANGIO INTRA EXTRACRAN SEL COM CAROTID INNOMINATE UNI L MOD SED  02/15/2021   IR ANGIO VERTEBRAL SEL VERTEBRAL BILAT MOD SED  02/15/2021   IR CT HEAD LTD  02/15/2021   IR PERCUTANEOUS ART THROMBECTOMY/INFUSION INTRACRANIAL INC DIAG ANGIO  02/15/2021   IR US  GUIDE VASC ACCESS RIGHT  02/15/2021   RADIOLOGY WITH ANESTHESIA N/A 02/14/2021   Procedure: IR WITH ANESTHESIA;  Surgeon: Dolphus Carrion, MD;  Location: [facility] OR;  Service: Radiology;  Laterality: N/A;   TEE WITHOUT CARDIOVERSION N/A 02/21/2021   Procedure: TRANSESOPHAGEAL ECHOCARDIOGRAM (TEE);  Surgeon: Raford Riggs, MD;  Location: [facility] ENDOSCOPY;  Service: Cardiovascular;  Laterality: N/A;   Patient Active Problem List   Diagnosis Date Noted   Spastic hemiparesis of left nondominant side (HCC) 07/03/2022   Acquired subluxation of left shoulder 06/13/2021   Homonymous hemianopsia, left 06/13/2021   Sprain of left ankle    Sleep disturbance    Hyperglycemia    Hypoalbuminemia due to protein-calorie malnutrition    Dyslipidemia    Hemiparesis affecting left side as late effect of stroke  (HCC)    Lethargy    Cerebrovascular accident (CVA) due to occlusion of right posterior cerebral artery (HCC) 02/22/2021   Cerebrovascular accident (CVA) (HCC)    Right carotid artery occlusion 02/14/2021    ONSET DATE: 04/05/2024 (Date of referral)  REFERRING DIAG: History of stroke with residual deficit (I69.30)   THERAPY DIAG:  Stiffness of left wrist, not elsewhere classified  Stiffness of left hand, not elsewhere classified  Other symptoms and signs involving the nervous system  Muscle weakness (generalized)  Hemiplegia and hemiparesis following other cerebrovascular disease affecting left non-dominant side (HCC)  Visuospatial deficit  Other symptoms and signs involving the musculoskeletal system  Rationale for Evaluation and Treatment: Rehabilitation  SUBJECTIVE:   SUBJECTIVE STATEMENT: Pt reports he thinks the neuro glove charging cable is not the correct one or the unit needs to be replaced.   Pt accompanied by: self  PERTINENT HISTORY: CVA, L side hemiparesis, hyperglycemia, L shoulder subluxation, left homonymous hemianopsia, Spastic hemiparesis of left nondominant side    PAIN:  Are you having pain? No  FALLS: Has patient fallen in last 6 months? No  LIVING ENVIRONMENT: Lives with: lives alone Lives in: House/apartment Stairs: Yes: External: 5 steps; on left going up and one set of B rails Has following equipment at home: L AFO  PLOF: Independent; on disability; taking care of kids  PATIENT GOALS: to maintain level of function, better manage health  OBJECTIVE:   HAND DOMINANCE: Right  ADLs and IADLs: Overall ADLs and IADLs: mod I (uses rocker knife to cut food)  MOBILITY STATUS: Independent  FUNCTIONAL OUTCOME MEASURES: 06/16/2024: total score 0   COGNITION: Overall cognitive status: Within functional limits for tasks assessed  VISUAL FINDINGS: L homonymous hemianopsia Baseline vision: does not wear glasses, has not had an eye exam in  years Visual history: none  Impaired - able to read clock on wall and text on paper printout  OBSERVATIONS: Pt ambulates with use of L AFO. No loss of balance though unsteady gait. The pt appears well kept and has ball cap donned. L facial droop (chronic presentation)  TODAY'S TREATMENT:                                                                                                                              - Neuro re-education completed for duration as noted below including: Pick up and release of 1 blocks using LUE requiring increased time, multimodal cueing, and hand over hand assistance for improved ROM and functional use.  - Self-care/home management completed for duration as noted below including: OT educated pt in stress reduction techniques as noted in pt instructions.  PATIENT EDUCATION: Education details: Stress reduction; LUE functional use Person educated: Patient Education method: Explanation, Demonstration, Tactile cues, and Verbal cues Education comprehension: verbalized understanding, returned demonstration, verbal cues required, tactile cues required, and needs further education  HOME EXERCISE PROGRAM: 05/20/2024: vision strategies; spasticity management; health management 06/21/24: Sleep Hygiene 06/23/2024: stress reduction techniques  GOALS:  SHORT TERM GOALS: Target date: 06/17/2024   Patient will independently recall at least 2 compensatory strategies for visual impairment without cueing. Baseline: New to outpt OT  Goal status: IN PROGRESS  2.  Patient will return demonstration of visual adaptation use and verbalize understanding of how to obtain item(s) if desired.  Baseline: New to outpt OT  Goal status: INITIAL  3.  Patient will independently verbalize at least 3 stress reduction techniques for symptom management as needed for improved participation in occupations.  Baseline: New to outpt OT  Goal status: IN PROGRESS  4.   Patient will independently  recall 3 sleep hygiene strategies to improve the quality, duration, and consistency of sleep, which supports mental, emotional, and physical health.  Baseline: New to outpt OT  Goal status: IN PROGRESS 06/21/24: handout provided; pt may need to explore alternate medication also  5.  Pt will independently recall at least 3 ways to manage spasticity in RUE as needed to improve pain and overall function.   Baseline: New to outpt OT  Goal status: IN PROGRESS 06/21/24: Splint and stretching   LONG TERM GOALS: Target date: 07/29/2024    Patient will report at least two-point increase in average PSFS score or at least three-point increase in a single activity score indicating functionally significant improvement given minimum detectable change.  Baseline: 0 total score (See above for individual activity scores)  Goal status: INITIAL  2.  Patient will demonstrate independent use of scanning and head-turning strategies during mobility and ADLs to compensate for visual field deficits in 90% of observed opportunities. Baseline:  Goal status: INITIAL  ASSESSMENT:  CLINICAL IMPRESSION: Patient is a 39 y.o. y.o. male who was seen today for occupational therapy treatment to address chronic LUE and visual deficits following CVA. Fair recall of education from prior therapy visit and would benefit from review. Recommend continued focus on LUE functional use and promotion of HEP carryover.   PERFORMANCE DEFICITS: in functional skills including ADLs, IADLs, coordination, tone, ROM, strength, muscle spasms, Fine motor control, Gross motor control, mobility, decreased knowledge of precautions, decreased knowledge of use of DME, vision, and UE functional use.   IMPAIRMENTS: are limiting patient from ADLs, IADLs, rest and sleep, and leisure.   CO-MORBIDITIES: may have co-morbidities  that affects occupational performance. Patient will benefit from skilled OT to address above impairments and improve overall  function.  REHAB POTENTIAL: Fair given chronicity of CVA  PLAN:  OT FREQUENCY: 1-2x/week  OT DURATION: 8 weeks  PLANNED INTERVENTIONS: self care/ADL training, therapeutic exercise, therapeutic activity, functional mobility training, patient/family education, visual/perceptual remediation/compensation, coping strategies training, DME and/or AE instructions, and Re-evaluation  RECOMMENDED OTHER SERVICES: None at this time   CONSULTED AND AGREED WITH PLAN OF CARE: Patient   PLAN FOR NEXT SESSION: adjust RMI finger pan to prevent spasm (digit flexion during the night)  Vision Goals  Was he able find charging cord for Neuro glove (?) Did he get order for [facility] for Botox?  Go over general vision packet and vision strategies; initiate visual activities (scanning in office)  -discussed a visual cues for driving   Needs printed HEP ideas  Check BP; stress reduction techniques    Jocelyn CHRISTELLA Bottom, OT 06/23/2024, 4:42 PM          "

## 2024-06-28 ENCOUNTER — Ambulatory Visit: Admitting: Occupational Therapy

## 2024-06-30 ENCOUNTER — Ambulatory Visit: Admitting: Occupational Therapy

## 2024-06-30 DIAGNOSIS — M25632 Stiffness of left wrist, not elsewhere classified: Secondary | ICD-10-CM

## 2024-06-30 DIAGNOSIS — M6281 Muscle weakness (generalized): Secondary | ICD-10-CM

## 2024-06-30 DIAGNOSIS — M25642 Stiffness of left hand, not elsewhere classified: Secondary | ICD-10-CM

## 2024-06-30 DIAGNOSIS — I69854 Hemiplegia and hemiparesis following other cerebrovascular disease affecting left non-dominant side: Secondary | ICD-10-CM

## 2024-06-30 DIAGNOSIS — R29818 Other symptoms and signs involving the nervous system: Secondary | ICD-10-CM

## 2024-06-30 DIAGNOSIS — R41842 Visuospatial deficit: Secondary | ICD-10-CM

## 2024-06-30 DIAGNOSIS — R29898 Other symptoms and signs involving the musculoskeletal system: Secondary | ICD-10-CM

## 2024-06-30 NOTE — Therapy (Signed)
 " OUTPATIENT OCCUPATIONAL THERAPY TREATMENT  Patient Name: David Craig MRN: [id number] DOB:April 08, 1986, 39 y.o., male Today's Date: 06/30/2024  PCP: Seabron Lenis, MD  REFERRING PROVIDER: Laurice Grice, OD  END OF SESSION:  OT End of Session - 06/30/24 1018     Visit Number 8    Number of Visits 17    Date for Recertification  07/29/24    Authorization Type [employer] Medicare    OT Start Time 1020    OT Stop Time 1100    OT Time Calculation (min) 40 min    Activity Tolerance Patient tolerated treatment well    Behavior During Therapy [facility] for tasks assessed/performed          No past medical history on file. Past Surgical History:  Procedure Laterality Date   BUBBLE STUDY  02/21/2021   Procedure: BUBBLE STUDY;  Surgeon: Raford Riggs, MD;  Location: [facility] ENDOSCOPY;  Service: Cardiovascular;;   IR ANGIO INTRA EXTRACRAN SEL COM CAROTID INNOMINATE UNI L MOD SED  02/15/2021   IR ANGIO VERTEBRAL SEL VERTEBRAL BILAT MOD SED  02/15/2021   IR CT HEAD LTD  02/15/2021   IR PERCUTANEOUS ART THROMBECTOMY/INFUSION INTRACRANIAL INC DIAG ANGIO  02/15/2021   IR US  GUIDE VASC ACCESS RIGHT  02/15/2021   RADIOLOGY WITH ANESTHESIA N/A 02/14/2021   Procedure: IR WITH ANESTHESIA;  Surgeon: Dolphus Carrion, MD;  Location: [facility] OR;  Service: Radiology;  Laterality: N/A;   TEE WITHOUT CARDIOVERSION N/A 02/21/2021   Procedure: TRANSESOPHAGEAL ECHOCARDIOGRAM (TEE);  Surgeon: Raford Riggs, MD;  Location: [facility] ENDOSCOPY;  Service: Cardiovascular;  Laterality: N/A;   Patient Active Problem List   Diagnosis Date Noted   Spastic hemiparesis of left nondominant side (HCC) 07/03/2022   Acquired subluxation of left shoulder 06/13/2021   Homonymous hemianopsia, left 06/13/2021   Sprain of left ankle    Sleep disturbance    Hyperglycemia    Hypoalbuminemia due to protein-calorie malnutrition    Dyslipidemia    Hemiparesis affecting left side as late effect of stroke (HCC)    Lethargy    Cerebrovascular  accident (CVA) due to occlusion of right posterior cerebral artery (HCC) 02/22/2021   Cerebrovascular accident (CVA) (HCC)    Right carotid artery occlusion 02/14/2021    ONSET DATE: 04/05/2024 (Date of referral)  REFERRING DIAG: History of stroke with residual deficit (I69.30)   THERAPY DIAG:  Stiffness of left wrist, not elsewhere classified  Stiffness of left hand, not elsewhere classified  Other symptoms and signs involving the nervous system  Muscle weakness (generalized)  Hemiplegia and hemiparesis following other cerebrovascular disease affecting left non-dominant side (HCC)  Visuospatial deficit  Other symptoms and signs involving the musculoskeletal system  Rationale for Evaluation and Treatment: Rehabilitation  SUBJECTIVE:   SUBJECTIVE STATEMENT: Pt reports his finger spasms have improved with resting hand splint wear.   Pt accompanied by: self  PERTINENT HISTORY: CVA, L side hemiparesis, hyperglycemia, L shoulder subluxation, left homonymous hemianopsia, Spastic hemiparesis of left nondominant side    PAIN:  Are you having pain? No  FALLS: Has patient fallen in last 6 months? No  LIVING ENVIRONMENT: Lives with: lives alone Lives in: House/apartment Stairs: Yes: External: 5 steps; on left going up and one set of B rails Has following equipment at home: L AFO  PLOF: Independent; on disability; taking care of kids  PATIENT GOALS: to maintain level of function, better manage health  OBJECTIVE:   HAND DOMINANCE: Right  ADLs and IADLs: Overall ADLs and IADLs: mod I (uses  rocker knife to cut food)  MOBILITY STATUS: Independent  FUNCTIONAL OUTCOME MEASURES: 06/16/2024: total score 0   COGNITION: Overall cognitive status: Within functional limits for tasks assessed  VISUAL FINDINGS: L homonymous hemianopsia Baseline vision: does not wear glasses, has not had an eye exam in years Visual history: none  Impaired - able to read clock on wall and text  on paper printout  OBSERVATIONS: Pt ambulates with use of L AFO. No loss of balance though unsteady gait. The pt appears well kept and has ball cap donned. L facial droop (chronic presentation)  TODAY'S TREATMENT:                                                                                                                              - Self-care/home management completed for duration as noted below including: OT reviewed RMI resting hand splint donning and doffing. Pt required max cueing initially and improving to min cueing with repetition. OT adjusted straps of brace for improved thumb and digit positioning. Objective measures assessed as noted in Goals section to determine progression towards goals. PATIENT EDUCATION: Education details: Resting hand splint donning and doffing; goal progression Person educated: Patient Education method: Explanation, Demonstration, Tactile cues, and Verbal cues Education comprehension: verbalized understanding, returned demonstration, verbal cues required, tactile cues required, and needs further education  HOME EXERCISE PROGRAM: 05/20/2024: vision strategies; spasticity management; health management 06/21/24: Sleep Hygiene 06/23/2024: stress reduction techniques  GOALS:  SHORT TERM GOALS: Target date: 06/17/2024   Patient will independently recall at least 2 compensatory strategies for visual impairment without cueing. Baseline: New to outpt OT  Goal status: IN PROGRESS  2.  Patient will return demonstration of visual adaptation use and verbalize understanding of how to obtain item(s) if desired.  Baseline: New to outpt OT  Goal status: INITIAL  3.  Patient will independently verbalize at least 3 stress reduction techniques for symptom management as needed for improved participation in occupations.  Baseline: New to outpt OT  Goal status: MET  4.   Patient will independently recall 3 sleep hygiene strategies to improve the quality, duration, and  consistency of sleep, which supports mental, emotional, and physical health.  Baseline: New to outpt OT  Goal status: MET 06/21/24: handout provided; pt may need to explore alternate medication also  5.  Pt will independently recall at least 3 ways to manage spasticity in RUE as needed to improve pain and overall function.   Baseline: New to outpt OT  Goal status: MET 06/21/24: Splint and stretching   LONG TERM GOALS: Target date: 07/29/2024    Patient will report at least two-point increase in average PSFS score or at least three-point increase in a single activity score indicating functionally significant improvement given minimum detectable change.  Baseline: 0 total score (See above for individual activity scores)  Goal status: INITIAL  2.  Patient will demonstrate independent use of scanning and head-turning strategies during mobility and ADLs to  compensate for visual field deficits in 90% of observed opportunities. Baseline:  Goal status: INITIAL  ASSESSMENT:  CLINICAL IMPRESSION: Patient is a 39 y.o. y.o. male who was seen today for occupational therapy treatment to address chronic LUE and visual deficits following CVA. Fair recall of education from prior therapy visit and would benefit from review. Recommend continued focus on LUE functional use and promotion of HEP carryover.   PERFORMANCE DEFICITS: in functional skills including ADLs, IADLs, coordination, tone, ROM, strength, muscle spasms, Fine motor control, Gross motor control, mobility, decreased knowledge of precautions, decreased knowledge of use of DME, vision, and UE functional use.   IMPAIRMENTS: are limiting patient from ADLs, IADLs, rest and sleep, and leisure.   CO-MORBIDITIES: may have co-morbidities  that affects occupational performance. Patient will benefit from skilled OT to address above impairments and improve overall function.  REHAB POTENTIAL: Fair given chronicity of CVA  PLAN:  OT FREQUENCY:  1-2x/week  OT DURATION: 8 weeks  PLANNED INTERVENTIONS: self care/ADL training, therapeutic exercise, therapeutic activity, functional mobility training, patient/family education, visual/perceptual remediation/compensation, coping strategies training, DME and/or AE instructions, and Re-evaluation  RECOMMENDED OTHER SERVICES: None at this time   CONSULTED AND AGREED WITH PLAN OF CARE: Patient   PLAN FOR NEXT SESSION: adjust RMI PRN (thumb getting and staying in place?)  Vision Goals  Was he able find charging cord for Neuro glove (?) Did he get order for [facility] for Botox? - will see PCP in Feb. for referral  Go over general vision packet and vision strategies; initiate visual activities (scanning in office)  -discussed a visual cues for driving   Needs printed HEP ideas  Check BP  Jocelyn CHRISTELLA Bottom, OT 06/30/2024, 1:48 PM "

## 2024-07-05 ENCOUNTER — Ambulatory Visit: Admitting: Occupational Therapy

## 2024-07-07 ENCOUNTER — Ambulatory Visit: Admitting: Occupational Therapy

## 2024-07-07 DIAGNOSIS — M6281 Muscle weakness (generalized): Secondary | ICD-10-CM

## 2024-07-07 DIAGNOSIS — M25642 Stiffness of left hand, not elsewhere classified: Secondary | ICD-10-CM

## 2024-07-07 DIAGNOSIS — R29898 Other symptoms and signs involving the musculoskeletal system: Secondary | ICD-10-CM

## 2024-07-07 DIAGNOSIS — M25632 Stiffness of left wrist, not elsewhere classified: Secondary | ICD-10-CM

## 2024-07-07 DIAGNOSIS — I69854 Hemiplegia and hemiparesis following other cerebrovascular disease affecting left non-dominant side: Secondary | ICD-10-CM

## 2024-07-07 DIAGNOSIS — R41842 Visuospatial deficit: Secondary | ICD-10-CM

## 2024-07-07 DIAGNOSIS — R29818 Other symptoms and signs involving the nervous system: Secondary | ICD-10-CM

## 2024-07-07 NOTE — Therapy (Signed)
 " OUTPATIENT OCCUPATIONAL THERAPY TREATMENT  Patient Name: Kodiak Rollyson MRN: [id number] DOB:[birth year], 39 y.o., male Today's Date: 07/07/2024  PCP: Seabron Lenis, MD  REFERRING PROVIDER: Laurice Grice, OD  END OF SESSION:  OT End of Session - 07/07/24 1148     Visit Number 9    Number of Visits 17    Date for Recertification  07/29/24    Authorization Type [employer] Medicare    OT Start Time 1148    OT Stop Time 1231    OT Time Calculation (min) 43 min    Activity Tolerance Patient tolerated treatment well    Behavior During Therapy [facility] for tasks assessed/performed          No past medical history on file. Past Surgical History:  Procedure Laterality Date   BUBBLE STUDY  02/21/2021   Procedure: BUBBLE STUDY;  Surgeon: Raford Riggs, MD;  Location: [facility] ENDOSCOPY;  Service: Cardiovascular;;   IR ANGIO INTRA EXTRACRAN SEL COM CAROTID INNOMINATE UNI L MOD SED  02/15/2021   IR ANGIO VERTEBRAL SEL VERTEBRAL BILAT MOD SED  02/15/2021   IR CT HEAD LTD  02/15/2021   IR PERCUTANEOUS ART THROMBECTOMY/INFUSION INTRACRANIAL INC DIAG ANGIO  02/15/2021   IR US  GUIDE VASC ACCESS RIGHT  02/15/2021   RADIOLOGY WITH ANESTHESIA N/A 02/14/2021   Procedure: IR WITH ANESTHESIA;  Surgeon: Dolphus Carrion, MD;  Location: [facility] OR;  Service: Radiology;  Laterality: N/A;   TEE WITHOUT CARDIOVERSION N/A 02/21/2021   Procedure: TRANSESOPHAGEAL ECHOCARDIOGRAM (TEE);  Surgeon: Raford Riggs, MD;  Location: [facility] ENDOSCOPY;  Service: Cardiovascular;  Laterality: N/A;   Patient Active Problem List   Diagnosis Date Noted   Spastic hemiparesis of left nondominant side (HCC) 07/03/2022   Acquired subluxation of left shoulder 06/13/2021   Homonymous hemianopsia, left 06/13/2021   Sprain of left ankle    Sleep disturbance    Hyperglycemia    Hypoalbuminemia due to protein-calorie malnutrition    Dyslipidemia    Hemiparesis affecting left side as late effect of stroke (HCC)    Lethargy    Cerebrovascular accident  (CVA) due to occlusion of right posterior cerebral artery (HCC) 02/22/2021   Cerebrovascular accident (CVA) (HCC)    Right carotid artery occlusion 02/14/2021    ONSET DATE: 04/05/2024 (Date of referral)  REFERRING DIAG: History of stroke with residual deficit (I69.30)   THERAPY DIAG:  Stiffness of left wrist, not elsewhere classified  Stiffness of left hand, not elsewhere classified  Other symptoms and signs involving the nervous system  Muscle weakness (generalized)  Hemiplegia and hemiparesis following other cerebrovascular disease affecting left non-dominant side (HCC)  Visuospatial deficit  Other symptoms and signs involving the musculoskeletal system  Rationale for Evaluation and Treatment: Rehabilitation  SUBJECTIVE:   SUBJECTIVE STATEMENT: Pt reports drastic improvement with resting hand splint wear.   Pt accompanied by: self  PERTINENT HISTORY: CVA, L side hemiparesis, hyperglycemia, L shoulder subluxation, left homonymous hemianopsia, Spastic hemiparesis of left nondominant side    PAIN:  Are you having pain? No  FALLS: Has patient fallen in last 6 months? No  LIVING ENVIRONMENT: Lives with: lives alone Lives in: House/apartment Stairs: Yes: External: 5 steps; on left going up and one set of B rails Has following equipment at home: L AFO  PLOF: Independent; on disability; taking care of kids  PATIENT GOALS: to maintain level of function, better manage health  OBJECTIVE:   HAND DOMINANCE: Right  ADLs and IADLs: Overall ADLs and IADLs: mod I (uses rocker knife to  cut food)  MOBILITY STATUS: Independent  FUNCTIONAL OUTCOME MEASURES: 06/16/2024: total score 0   COGNITION: Overall cognitive status: Within functional limits for tasks assessed  VISUAL FINDINGS: L homonymous hemianopsia Baseline vision: does not wear glasses, has not had an eye exam in years Visual history: none  Impaired - able to read clock on wall and text on paper  printout  OBSERVATIONS: Pt ambulates with use of L AFO. No loss of balance though unsteady gait. The pt appears well kept and has ball cap donned. L facial droop (chronic presentation)  TODAY'S TREATMENT:                                                                                                                              - Self-care/home management completed for duration as noted below including: OT reviewed vision strategies in lieu of L visual field cut including scanning and use of adequate lighting/reducing glare. Therapist reviewed Blindspot Glare Elimination method to adjust left side mirror, use of convex mirrors,  panoramic rearview mirror, and car features to improve safety with driving.  - Therapeutic activities completed for duration as noted below including: Pt completed 24 piece puzzle for visual scanning and use of RUE for border completion and then LUE with [facility] assistance for middle pieces to promote functional use of LUE and visual scanning. OT educated pt on table top play of Golf Solitaire to address reaction time, scanning and locating of items, processing, bimanual coordination/trunk control, sequencing of unfamiliar motor movements or tasks, and motor planning. Pt required minimal cues for proper play. OT encouraged pt to incorporate LUE into task completion as much as possible. Pt opted to hold cards in L hand and play with R hand.  PATIENT EDUCATION: Education details: Resting hand splint donning and doffing; goal progression Person educated: Patient Education method: Explanation, Demonstration, Tactile cues, and Verbal cues Education comprehension: verbalized understanding, returned demonstration, verbal cues required, tactile cues required, and needs further education  HOME EXERCISE PROGRAM: 05/20/2024: vision strategies; spasticity management; health management 06/21/24: Sleep Hygiene 06/23/2024: stress reduction techniques  GOALS:  SHORT TERM GOALS: Target date:  06/17/2024   Patient will independently recall at least 2 compensatory strategies for visual impairment without cueing. Baseline: New to outpt OT  Goal status: IN PROGRESS  2.  Patient will return demonstration of visual adaptation use and verbalize understanding of how to obtain item(s) if desired.  Baseline: New to outpt OT  Goal status: IN PROGRESS  3.  Patient will independently verbalize at least 3 stress reduction techniques for symptom management as needed for improved participation in occupations.  Baseline: New to outpt OT  Goal status: MET  4.   Patient will independently recall 3 sleep hygiene strategies to improve the quality, duration, and consistency of sleep, which supports mental, emotional, and physical health.  Baseline: New to outpt OT  Goal status: MET 06/21/24: handout provided; pt may need to explore alternate medication also  5.  Pt will independently recall at least 3 ways to manage spasticity in RUE as needed to improve pain and overall function.   Baseline: New to outpt OT  Goal status: MET 06/21/24: Splint and stretching   LONG TERM GOALS: Target date: 07/29/2024    Patient will report at least two-point increase in average PSFS score or at least three-point increase in a single activity score indicating functionally significant improvement given minimum detectable change.  Baseline: 0 total score (See above for individual activity scores)  Goal status: INITIAL  2.  Patient will demonstrate independent use of scanning and head-turning strategies during mobility and ADLs to compensate for visual field deficits in 90% of observed opportunities. Baseline:  Goal status: INITIAL  ASSESSMENT:  CLINICAL IMPRESSION: Patient is a 39 y.o. y.o. male who was seen today for occupational therapy treatment to address chronic LUE and visual deficits following CVA. Good tolerance to use of LUE with cues for stretching, positioning, and task modifications. Recommend  continued focus on LUE functional use and promotion of HEP carryover.   PERFORMANCE DEFICITS: in functional skills including ADLs, IADLs, coordination, tone, ROM, strength, muscle spasms, Fine motor control, Gross motor control, mobility, decreased knowledge of precautions, decreased knowledge of use of DME, vision, and UE functional use.   IMPAIRMENTS: are limiting patient from ADLs, IADLs, rest and sleep, and leisure.   CO-MORBIDITIES: may have co-morbidities  that affects occupational performance. Patient will benefit from skilled OT to address above impairments and improve overall function.  REHAB POTENTIAL: Fair given chronicity of CVA  PLAN:  OT FREQUENCY: 1-2x/week  OT DURATION: 8 weeks  PLANNED INTERVENTIONS: self care/ADL training, therapeutic exercise, therapeutic activity, functional mobility training, patient/family education, visual/perceptual remediation/compensation, coping strategies training, DME and/or AE instructions, and Re-evaluation  RECOMMENDED OTHER SERVICES: None at this time   CONSULTED AND AGREED WITH PLAN OF CARE: Patient   PLAN FOR NEXT SESSION: review golf solitaire  adjust RMI PRN (thumb getting and staying in place?)  Vision Goals  Was he able find charging cord for Neuro glove (?) Did he get order for [facility] for Botox? - will see PCP in Feb. 12 for referral  Go over general vision packet and vision strategies; initiate visual activities (scanning in office)  -discussed a visual cues for driving   Needs printed HEP ideas  Check BP  Jocelyn CHRISTELLA Bottom, OT 07/07/2024, 12:59 PM "

## 2024-07-07 NOTE — Patient Instructions (Signed)
 SABRA

## 2024-07-12 ENCOUNTER — Ambulatory Visit: Admitting: Occupational Therapy

## 2024-07-14 ENCOUNTER — Ambulatory Visit: Admitting: Occupational Therapy

## 2024-07-19 ENCOUNTER — Ambulatory Visit: Admitting: Occupational Therapy

## 2024-07-21 ENCOUNTER — Ambulatory Visit: Admitting: Occupational Therapy

## 2024-07-26 ENCOUNTER — Ambulatory Visit: Admitting: Occupational Therapy

## 2024-07-28 ENCOUNTER — Ambulatory Visit: Admitting: Occupational Therapy
